# Patient Record
Sex: Male | Born: 1965
Health system: Southern US, Community
[De-identification: ages and names within clinical notes are randomized; demographics above are authoritative.]

## PROBLEM LIST (undated history)

## (undated) DIAGNOSIS — T4145XA Adverse effect of unspecified anesthetic, initial encounter: Secondary | ICD-10-CM

## (undated) DIAGNOSIS — E781 Pure hyperglyceridemia: Secondary | ICD-10-CM

## (undated) DIAGNOSIS — I499 Cardiac arrhythmia, unspecified: Secondary | ICD-10-CM

## (undated) DIAGNOSIS — R519 Headache, unspecified: Secondary | ICD-10-CM

## (undated) DIAGNOSIS — R0600 Dyspnea, unspecified: Secondary | ICD-10-CM

## (undated) DIAGNOSIS — R200 Anesthesia of skin: Secondary | ICD-10-CM

## (undated) DIAGNOSIS — G4730 Sleep apnea, unspecified: Secondary | ICD-10-CM

## (undated) DIAGNOSIS — T8859XA Other complications of anesthesia, initial encounter: Secondary | ICD-10-CM

## (undated) DIAGNOSIS — I10 Essential (primary) hypertension: Secondary | ICD-10-CM

## (undated) DIAGNOSIS — C61 Malignant neoplasm of prostate: Secondary | ICD-10-CM

## (undated) DIAGNOSIS — I4891 Unspecified atrial fibrillation: Secondary | ICD-10-CM

## (undated) DIAGNOSIS — M1990 Unspecified osteoarthritis, unspecified site: Secondary | ICD-10-CM

## (undated) HISTORY — PX: ANKLE SURGERY: SHX546

## (undated) HISTORY — DX: Malignant neoplasm of prostate: C61

## (undated) HISTORY — PX: JOINT REPLACEMENT: SHX530

## (undated) HISTORY — DX: Sleep apnea, unspecified: G47.30

---

## 1999-04-01 ENCOUNTER — Encounter: Payer: Self-pay | Admitting: Specialist

## 1999-04-01 ENCOUNTER — Ambulatory Visit (HOSPITAL_COMMUNITY): Admission: RE | Admit: 1999-04-01 | Discharge: 1999-04-01 | Payer: Self-pay | Admitting: Specialist

## 2001-02-10 HISTORY — PX: HAND SURGERY: SHX662

## 2001-05-03 ENCOUNTER — Emergency Department (HOSPITAL_COMMUNITY): Admission: EM | Admit: 2001-05-03 | Discharge: 2001-05-03 | Payer: Self-pay | Admitting: Emergency Medicine

## 2002-04-06 ENCOUNTER — Ambulatory Visit (HOSPITAL_COMMUNITY): Admission: RE | Admit: 2002-04-06 | Discharge: 2002-04-06 | Payer: Self-pay | Admitting: Specialist

## 2002-04-06 ENCOUNTER — Encounter: Payer: Self-pay | Admitting: Specialist

## 2008-02-21 ENCOUNTER — Inpatient Hospital Stay (HOSPITAL_COMMUNITY): Admission: EM | Admit: 2008-02-21 | Discharge: 2008-02-23 | Payer: Self-pay | Admitting: Emergency Medicine

## 2008-04-06 ENCOUNTER — Ambulatory Visit (HOSPITAL_COMMUNITY): Admission: RE | Admit: 2008-04-06 | Discharge: 2008-04-06 | Payer: Self-pay | Admitting: Family Medicine

## 2010-05-27 LAB — COMPREHENSIVE METABOLIC PANEL
ALT: 22 U/L (ref 0–53)
AST: 18 U/L (ref 0–37)
Albumin: 2.7 g/dL — ABNORMAL LOW (ref 3.5–5.2)
Alkaline Phosphatase: 43 U/L (ref 39–117)
BUN: 8 mg/dL (ref 6–23)
Chloride: 101 mEq/L (ref 96–112)
GFR calc Af Amer: >60 mL/min (ref >60)
Potassium: 3.5 mEq/L (ref 3.5–5.1)
Sodium: 135 mEq/L (ref 135–145)
Total Bilirubin: 0.8 mg/dL (ref 0.3–1.2)
Total Protein: 6.2 g/dL (ref 6.0–8.3)

## 2010-05-27 LAB — TROPONIN I: Troponin I: <0.01 ng/mL (ref 0.00–0.06)

## 2010-05-27 LAB — CULTURE, BLOOD (ROUTINE X 2)
Culture: NO GROWTH
Culture: NO GROWTH

## 2010-05-27 LAB — CBC
HCT: 36.8 % — ABNORMAL LOW (ref 39.0–52.0)
HCT: 36.8 % — ABNORMAL LOW (ref 39.0–52.0)
HCT: 41.2 % (ref 39.0–52.0)
Hemoglobin: 13.8 g/dL (ref 13.0–17.0)
MCHC: 33.4 g/dL (ref 30.0–36.0)
MCHC: 33.5 g/dL (ref 30.0–36.0)
MCV: 85.5 fL (ref 78.0–100.0)
MCV: 86.3 fL (ref 78.0–100.0)
Platelets: 231 10*3/uL (ref 150–400)
Platelets: 244 K/uL (ref 150–400)
Platelets: 250 10*3/uL (ref 150–400)
RBC: 4.82 MIL/uL (ref 4.22–5.81)
RDW: 12.6 % (ref 11.5–15.5)
RDW: 13 % (ref 11.5–15.5)
WBC: 4.9 10*3/uL (ref 4.0–10.5)
WBC: 7.9 10*3/uL (ref 4.0–10.5)
WBC: 9.6 K/uL (ref 4.0–10.5)

## 2010-05-27 LAB — DIFFERENTIAL
Basophils Absolute: 0 10*3/uL (ref 0.0–0.1)
Basophils Relative: 0 % (ref 0–1)
Basophils Relative: 0 % (ref 0–1)
Eosinophils Absolute: 0.1 K/uL (ref 0.0–0.7)
Eosinophils Absolute: 0.3 10*3/uL (ref 0.0–0.7)
Eosinophils Relative: 1 % (ref 0–5)
Eosinophils Relative: 7 % — ABNORMAL HIGH (ref 0–5)
Lymphocytes Relative: 11 % — ABNORMAL LOW (ref 12–46)
Lymphs Abs: 1 K/uL (ref 0.7–4.0)
Lymphs Abs: 1.1 10*3/uL (ref 0.7–4.0)
Monocytes Absolute: 0.7 10*3/uL (ref 0.1–1.0)
Monocytes Relative: 8 % (ref 3–12)
Monocytes Relative: 8 % (ref 3–12)
Neutro Abs: 7.7 K/uL (ref 1.7–7.7)
Neutrophils Relative %: 81 % — ABNORMAL HIGH (ref 43–77)

## 2010-05-27 LAB — URINALYSIS, ROUTINE W REFLEX MICROSCOPIC
Bilirubin Urine: NEGATIVE
Glucose, UA: NEGATIVE mg/dL
Hgb urine dipstick: NEGATIVE
Ketones, ur: NEGATIVE mg/dL
Nitrite: NEGATIVE
Protein, ur: NEGATIVE mg/dL
Specific Gravity, Urine: 1.02 (ref 1.005–1.030)
Urobilinogen, UA: 4 mg/dL — ABNORMAL HIGH (ref 0.0–1.0)
pH: 6 (ref 5.0–8.0)

## 2010-05-27 LAB — POCT I-STAT, CHEM 8
BUN: 9 mg/dL (ref 6–23)
Calcium, Ion: 1.15 mmol/L (ref 1.12–1.32)
Chloride: 101 meq/L (ref 96–112)
Creatinine, Ser: 1 mg/dL (ref 0.4–1.5)
Glucose, Bld: 107 mg/dL — ABNORMAL HIGH (ref 70–99)
HCT: 42 % (ref 39.0–52.0)
Hemoglobin: 14.3 g/dL (ref 13.0–17.0)
Potassium: 3.7 meq/L (ref 3.5–5.1)
Sodium: 138 meq/L (ref 135–145)
TCO2: 25 mmol/L (ref 0–100)

## 2010-05-27 LAB — CK TOTAL AND CKMB (NOT AT ARMC)
CK, MB: 0.7 ng/mL (ref 0.3–4.0)
Relative Index: INVALID (ref 0.0–2.5)
Total CK: 28 U/L (ref 7–232)

## 2010-05-27 LAB — BRAIN NATRIURETIC PEPTIDE: Pro B Natriuretic peptide (BNP): 34 pg/mL (ref 0.0–100.0)

## 2010-06-25 NOTE — H&P (Signed)
Andrew Haley, Andrew Haley                  ACCOUNT NO.:  [id number]   MEDICAL RECORD NO.:  [id number]          PATIENT TYPE:  INP   LOCATION:  1845                         FACILITY:  [facility]   PHYSICIAN:  Ladell Pier, M.D.   DATE OF BIRTH:  [birth year]   DATE OF ADMISSION:  02/21/2008  DATE OF DISCHARGE:                              HISTORY & PHYSICAL   CHIEF COMPLAINT:  Shortness of breath and right-sided chest pain with  deep breathing.   HISTORY OF PRESENT ILLNESS:  The patient is a 45 year old white male  with no significant past medical history.  The patient stated that  starting yesterday, he has been having pain in the right side of his  chest.  It is worse when he takes a deep breath, he has  to hold his  breath.  He thought it was secondary  to __________ muscles that he  pulled a muscle.  He has had no nausea, vomiting.  He had some cough but  it is dry.  He has had no fever or chills.  It just hurts on the right  side, tender to touch, and hurts when he breathe.   PAST MEDICAL HISTORY:  Significant for knee surgery.   FAMILY HISTORY:  Mother died on the operating table during the time when  she was having aortic valve replaced, she was 54 years old.  Father died  of cancer when he was 85 years old.  He had throat cancer.   SOCIAL HISTORY:  No tobacco use.  No alcohol use.  He has a significant  other.  He has no children.  He does Proofreader.   MEDICATIONS:  1. Aleve p.r.n.  2. Goody's Powder p.r.n.   ALLERGIES:  None.   REVIEW OF SYSTEMS:  Negative,  otherwise stated in the HPI.   PHYSICAL EXAMINATION:  VITAL SIGNS:  Temperature 99.7, blood pressure  135/74, pulse 103, respirations 18, pulse ox 99%.  GENERAL:  Patient is sitting up on stretcher, does not seem to be in any  acute distress.  HEENT:  Head normocephalic/atraumatic.  Pupils reactive to light.  Throat without erythema.  CARDIOVASCULAR:  He is slightly tachycardiac.  LUNGS:  He he has some rhonchi on  the right side.  ABDOMEN:  Positive bowel sounds.  EXTREMITIES:  Without edema.   LABORATORY DATA:  CT scan of the chest showed probable right lower lobe  pneumonia and trace right parapneumonic effusion.  CK, creatinine kinase  28, MB 0.7.  WBC 9.6, hemoglobin 13.8, MCV 85.5, platelets 244.  Troponin of less than 0.01.  BNP of 34.  Sodium 138, potassium 3.7,  chloride 101, glucose 107, BUN 9, creatinine 1.0.   ASSESSMENT/PLAN:  1. Pneumonia.  2. Trace parapneumonic effusion.  3. Pleuritic chest pain.   Will admit the patient to hospital.  We will start him on intravenous  Rocephin and Zithromax.  We will monitor his pneumonia closely since he  does have some trace of parapneumonic effusion.  If he does not improve  and  the parapneumonic effusion worsens, he will probably need a  __________ consult and thoracentesis.  We will do Protonix for  gastrointestinal prophylaxis, Lovenox for deep vein thrombosis  prophylaxis, and manage his pain.  We will also do nebulizer treatments  for now.      Ladell Pier, M.D.  Electronically Signed     [place]/MEDQ  D:  02/21/2008  T:  02/21/2008  Job:  [id number]

## 2010-06-25 NOTE — Discharge Summary (Signed)
Andrew Haley, Andrew Haley                  ACCOUNT NO.:  [id number]   MEDICAL RECORD NO.:  [id number]          PATIENT TYPE:  INP   LOCATION:  3018                         FACILITY:  [facility]   PHYSICIAN:  Richarda Overlie, MD       DATE OF BIRTH:  Sep 30, 1965   DATE OF ADMISSION:  02/21/2008  DATE OF DISCHARGE:  02/23/2008                               DISCHARGE SUMMARY   DISCHARGE DIAGNOSES:  1. Community-acquired pneumonia.  2. Trace parapneumonic effusion.  3. Pleuritic chest pain.   SUBJECTIVE:  This is a 45 year old male with no significant past medical  history who presented to the ER with right-sided chest wall pain, worse  when taking a deep breath.  The patient initially thought he had pulled  a muscle.  However, when he presented to the emergency room, the patient  was found to be tachycardic with a low grade fever of 99.7.  Chest x-ray  showed pneumonia with trace right parapneumonic effusion.  The patient  also had a subsequent CT angio chest to rule out PE.  It was negative  for PE with probable right lower lobe pneumonia, trace right  parapneumonic effusion.  The patient was admitted and initiated on  ceftriaxone and azithromycin.  For pain control, the patient was started  on IV Toradol, Flexeril, and Percocet.  The patient had subsequent  lateral decubitus chest x-ray that showed atelectasis of the right lung  on the right side, but no significant pleural effusion with a trace  amount of pleural fluid in the right costophrenic angle.  The patient  was initiated on incentive spirometry.  On the following day, the  patient felt a lot better and requested to go home.  He remained  afebrile during the course of his hospitalization.  The patient was  recommended to continue deep breathing, coughing, and incentive  spirometry at home.  He is being discharged with antiinflammatory  medications and antibiotics for another 4 days.  He does not have a  primary care Danasia Baker, and thus, a  list of primary care providers in the  area who are accepting new patients is being provided.  Since he has  insurance, he will not be seen at the health department.   FOLLOWUP INSTRUCTIONS:  Return to work on February 28, 2008.  Call PCP to  set up appointment in 5 to 7 days.  Deep breathing exercises and  coughing.   DISCHARGE MEDICATIONS:  1. Moxifloxacin 400 mg p.o. daily for 4 days.  2. Flexeril 10 mg p.o. q.8 hours p.r.n.  3. Toradol 10 mg p.o. q.6 hours p.r.n.  4. Ibuprofen 600 mg p.o. q.8 hours x3 days with meals and then p.r.n.  5. Call for fever greater than 101, shortness of breath, chest pain,      or dizziness.      Richarda Overlie, MD  Electronically Signed     NA/MEDQ  D:  02/23/2008  T:  02/23/2008  Job:  [id number]

## 2013-09-02 NOTE — H&P (Signed)
  NTS SOAP Note  Vital Signs:  Vitals as of: 2/80/0349: Systolic 179: Diastolic 150: Heart Rate 86: Temp 96.85F: Height 45ft 1in: Weight 322Lbs 0 Ounces: BMI 42.48  BMI : 42.48 kg/m2  Subjective: This 48 Years 0 Months old Male presents for   a skin lesion on his right inner thigh.  Gets irritated with pressure.  Growth and pain have been noted recently.  Review of Symptoms:  Constitutional:unremarkable   Head:unremarkable    Eyes:unremarkable   Nose/Mouth/Throat:unremarkable Cardiovascular:  unremarkable   Respiratory:  wheezing Gastrointestinal:  unremarkable   Genitourinary:unremarkable       joint pain Hematolgic/Lymphatic:unremarkable     Allergic/Immunologic:unremarkable     Past Medical History:    Reviewed  Past Medical History  Surgical History: right knee surgery Medical Problems: HTN, skeletal pain Allergies: nkda Medications: lisinopril, diclofenac, hydrocodone   Social History:Reviewed  Social History  Preferred Language: English Race:  White Ethnicity: Not Hispanic / Latino Age: 48 Years 0 Months Marital Status:  S Alcohol: 3 a week   Smoking Status: Never smoker reviewed on 09/01/2013 Functional Status reviewed on 09/01/2013 ------------------------------------------------ Bathing: Normal Cooking: Normal Dressing: Normal Driving: Normal Eating: Normal Managing Meds: Normal Oral Care: Normal Shopping: Normal Toileting: Normal Transferring: Normal Walking: Normal Cognitive Status reviewed on 09/01/2013 ------------------------------------------------ Attention: Normal Decision Making: Normal Language: Normal Memory: Normal Motor: Normal Perception: Normal Problem Solving: Normal Visual and Spatial: Normal   Family History:  Reviewed  Family Health History Mother, Living; Heart disease;  Father, Living; Healthy; throat cancer    Objective Information: General:  Well appearing, well  nourished in no distress.      Large 3cm penduculated skin lesion in right inner thigh Heart:  RRR, no murmur Lungs:    CTA bilaterally, no wheezes, rhonchi, rales.  Breathing unlabored.  Assessment:Skin lesion, right thigh  Diagnoses: 239.2 Neoplasm of skin (Neoplasm of unspecified behavior of bone, soft tissue, and skin)  Procedures: 56979 - OFFICE OUTPATIENT NEW 20 MINUTES    Plan:  Scheduled for excision of skin lesion, right thigh on 09/23/13.   Patient Education:Alternative treatments to surgery were discussed with patient (and family).  Risks and benefits  of procedure were fully explained to the patient (and family) who gave informed consent. Patient/family questions were addressed.  Follow-up:Pending Surgery

## 2013-09-09 ENCOUNTER — Encounter (HOSPITAL_COMMUNITY): Payer: Self-pay | Admitting: Pharmacy Technician

## 2013-09-23 ENCOUNTER — Encounter (HOSPITAL_COMMUNITY)
Admission: RE | Disposition: A | Discharge status: Home / Self Care | Payer: Self-pay | Source: Ambulatory Visit | Attending: General Surgery

## 2013-09-23 ENCOUNTER — Ambulatory Visit (HOSPITAL_COMMUNITY)
Admission: RE | Admit: 2013-09-23 | Discharge: 2013-09-23 | Disposition: A | Discharge status: Home / Self Care | Payer: Medicare HMO | Source: Ambulatory Visit | Attending: General Surgery | Admitting: General Surgery

## 2013-09-23 ENCOUNTER — Encounter (HOSPITAL_COMMUNITY): Payer: Self-pay | Admitting: *Deleted

## 2013-09-23 DIAGNOSIS — L909 Atrophic disorder of skin, unspecified: Secondary | ICD-10-CM | POA: Diagnosis not present

## 2013-09-23 DIAGNOSIS — L919 Hypertrophic disorder of the skin, unspecified: Principal | ICD-10-CM

## 2013-09-23 HISTORY — PX: LESION REMOVAL: SHX5196

## 2013-09-23 SURGERY — MINOR EXCISION OF LESION
Anesthesia: LOCAL | Laterality: Left

## 2013-09-23 MED ORDER — BACITRACIN-NEOMYCIN-POLYMYXIN 400-5-5000 EX OINT
TOPICAL_OINTMENT | CUTANEOUS | Status: AC
  Filled 2013-09-23: qty 1

## 2013-09-23 MED ORDER — LIDOCAINE-EPINEPHRINE (PF) 1 %-1:200000 IJ SOLN
INTRAMUSCULAR | Status: AC
  Filled 2013-09-23: qty 10

## 2013-09-23 NOTE — Op Note (Signed)
Patient:  Andrew Haley  DOB:  12-10-1965  MRN:  [id number]   Preop Diagnosis:  Skin tag, left thigh  Postop Diagnosis:  Same  Procedure:  Excision of skin tag, left thigh  Surgeon:  Aviva Signs, M.D.  Anes:  Local  Indications:  Patient is a 48 year old white male who presents with an enlarging skin tag on the left inner thigh. The risks and benefits of the procedure were fully explained to the patient, who gave informed consent.  Procedure note:  The patient was placed in the prone position. The left upper inner thigh was prepped and draped using usual sterile technique with Betadine. Surgical site confirmation was performed.  1% Xylocaine with epi was used for local anesthesia. 7 cc was used in total. An elliptical incision was made around the base of the skin tag using Bovie electrocautery. The base was 1.5 cm in its greatest diameter. The specimen was disposed of. Minimal subcutaneous tissue was noted. The incision was closed using 4-0 Vicryl subcuticular suture. Dermabond was then applied.  All tape and needle counts were correct the end of the procedure. Patient was discharged from the minor procedure room in good and stable condition.    Complications:  None  EBL:  Minimal  Specimen:  Skin tag, disposed of

## 2013-09-23 NOTE — Interval H&P Note (Signed)
History and Physical Interval Note:  09/23/2013 8:43 AM  Andrew Haley  has presented today for surgery, with the diagnosis of neoplasm of skin left thigh 3 cm  The various methods of treatment have been discussed with the patient and family. After consideration of risks, benefits and other options for treatment, the patient has consented to  Procedure(s): MINOR EXCISION 3 CM SKIN LESION OF LEFT THIGH  (Left) as a surgical intervention .  The patient's history has been reviewed, patient examined, no change in status, stable for surgery.  I have reviewed the patient's chart and labs.  Questions were answered to the patient's satisfaction.     Aviva Signs A

## 2013-09-23 NOTE — Interval H&P Note (Signed)
History and Physical Interval Note:  09/23/2013 8:39 AM  Andrew Haley  has presented today for surgery, with the diagnosis of neoplasm of skin left thigh 3 cm  The various methods of treatment have been discussed with the patient and family. After consideration of risks, benefits and other options for treatment, the patient has consented to  Procedure(s): MINOR EXCISION 3 CM SKIN LESION OF LEFT THIGH  (Left) as a surgical intervention .  The patient's history has been reviewed, patient examined, no change in status, stable for surgery.  I have reviewed the patient's chart and labs.  Questions were answered to the patient's satisfaction.     Aviva Signs A

## 2013-09-23 NOTE — Discharge Instructions (Signed)
Keep wound clean and dry.

## 2013-09-23 NOTE — Progress Notes (Signed)
Patient tolerated well sent home with discharge instructions. Follow up with dr Arnoldo Morale as needed.

## 2013-09-27 ENCOUNTER — Encounter (HOSPITAL_COMMUNITY): Payer: Self-pay | Admitting: General Surgery

## 2013-12-14 ENCOUNTER — Other Ambulatory Visit: Payer: Self-pay | Admitting: Surgical

## 2014-02-15 ENCOUNTER — Other Ambulatory Visit (HOSPITAL_COMMUNITY): Payer: Self-pay | Admitting: *Deleted

## 2014-02-15 NOTE — Patient Instructions (Addendum)
Andrew Haley  02/15/2014   Your procedure is scheduled on: 02/22/14   Report to [facility]  Entrance and follow signs to               [address] at 9:30  AM   Call this number if you have problems the morning of surgery [phone]   Remember:  Do not eat food or drink liquids :After Midnight.     Take these medicines the morning of surgery with A SIP OF WATER: MAY TAKE HYDROCODONE IF NEEDED                               You may not have any metal on your body including hair pins and              piercings  Do not wear jewelry, make-up, lotions, powders or perfumes.             Do not wear nail polish.  Do not shave  48 hours prior to surgery.              Men may shave face and neck.   Do not bring valuables to the hospital. Andrew Haley.  Contacts, dentures or bridgework may not be worn into surgery.  Leave suitcase in the car. After surgery it may be brought to your room.     Patients discharged the day of surgery will not be allowed to drive home.  Name and phone number of your driver:  Special Instructions: N/A              Please read over the following fact sheets you were given: _____________________________________________________________________                                                     Andrew Haley  Before surgery, you can play an important role.  Because skin is not sterile, your skin needs to be as free of germs as possible.  You can reduce the number of germs on your skin by washing with CHG (chlorahexidine gluconate) soap before surgery.  CHG is an antiseptic cleaner which kills germs and bonds with the skin to continue killing germs even after washing. Please DO NOT use if you have an allergy to CHG or antibacterial soaps.  If your skin becomes reddened/irritated stop using the CHG and inform your nurse when you arrive at Short Stay. Do not shave  (including legs and underarms) for at least 48 hours prior to the first CHG shower.  You may shave your face. Please follow these instructions carefully:   1.  Shower with CHG Soap the night before surgery and the  morning of Surgery.   2.  If you choose to wash your hair, wash your hair first as usual with your  normal  Shampoo.   3.  After you shampoo, rinse your hair and body thoroughly to remove the  shampoo.  4.  Use CHG as you would any other liquid soap.  You can apply chg directly  to the skin and wash . Gently wash with scrungie or clean wascloth    5.  Apply the CHG Soap to your body ONLY FROM THE NECK DOWN.   Do not use on open                           Wound or open sores. Avoid contact with eyes, ears mouth and genitals (private parts).                        Genitals (private parts) with your normal soap.              6.  Wash thoroughly, paying special attention to the area where your surgery  will be performed.   7.  Thoroughly rinse your body with warm water from the neck down.   8.  DO NOT shower/wash with your normal soap after using and rinsing off  the CHG Soap .                9.  Pat yourself dry with a clean towel.             10.  Wear clean pajamas.             11.  Place clean sheets on your bed the night of your first shower and do not  sleep with pets.  Day of Surgery : Do not apply any lotions/deodorants the morning of surgery.  Please wear clean clothes to the hospital/surgery center.  FAILURE TO FOLLOW THESE INSTRUCTIONS MAY RESULT IN THE CANCELLATION OF YOUR SURGERY    PATIENT SIGNATURE_________________________________  ______________________________________________________________________     Andrew Haley  An incentive spirometer is a tool that can help keep your lungs clear and active. This tool measures how well you are filling your lungs with each breath. Taking long deep breaths may help  reverse or decrease the chance of developing breathing (pulmonary) problems (especially infection) following:  A long period of time when you are unable to move or be active. BEFORE THE PROCEDURE   If the spirometer includes an indicator to show your best effort, your nurse or respiratory therapist will set it to a desired goal.  If possible, sit up straight or lean slightly forward. Try not to slouch.  Hold the incentive spirometer in an upright position. INSTRUCTIONS FOR USE   Sit on the edge of your bed if possible, or sit up as far as you can in bed or on a chair.  Hold the incentive spirometer in an upright position.  Breathe out normally.  Place the mouthpiece in your mouth and seal your lips tightly around it.  Breathe in slowly and as deeply as possible, raising the piston or the ball toward the top of the column.  Hold your breath for 3-5 seconds or for as long as possible. Allow the piston or ball to fall to the bottom of the column.  Remove the mouthpiece from your mouth and breathe out normally.  Rest for a few seconds and repeat Steps 1 through 7 at least 10 times every 1-2 hours when you are awake. Take your time and take a few normal breaths between deep breaths.  The spirometer may include an indicator to show your best effort. Use the indicator as a goal to work toward during  each repetition.  After each set of 10 deep breaths, practice coughing to be sure your lungs are clear. If you have an incision (the cut made at the time of surgery), support your incision when coughing by placing a pillow or rolled up towels firmly against it. Once you are able to get out of bed, walk around indoors and cough well. You may stop using the incentive spirometer when instructed by your caregiver.  RISKS AND COMPLICATIONS  Take your time so you do not get dizzy or light-headed.  If you are in pain, you may need to take or ask for pain medication before doing incentive spirometry.  It is harder to take a deep breath if you are having pain. AFTER USE  Rest and breathe slowly and easily.  It can be helpful to keep track of a log of your progress. Your caregiver can provide you with a simple table to help with this. If you are using the spirometer at home, follow these instructions: Andrew Haley IF:   You are having difficultly using the spirometer.  You have trouble using the spirometer as often as instructed.  Your pain medication is not giving enough relief while using the spirometer.  You develop fever of 100.5 F (38.1 C) or higher. SEEK IMMEDIATE MEDICAL CARE IF:   You cough up bloody sputum that had not been present before.  You develop fever of 102 F (38.9 C) or greater.  You develop worsening pain at or near the incision site. MAKE SURE YOU:   Understand these instructions.  Will watch your condition.  Will get help right away if you are not doing well or get worse. Document Released: 06/09/2006 Document Revised: 04/21/2011 Document Reviewed: 08/10/2006 Exit[employer] Patient Information 2014 Exit[employer], [place].   ________________________________________________________________________  WHAT IS A BLOOD TRANSFUSION? Blood Transfusion Information  A transfusion is the replacement of blood or some of its parts. Blood is made up of multiple cells which provide different functions.  Red blood cells carry oxygen and are used for blood loss replacement.  White blood cells fight against infection.  Platelets control bleeding.  Plasma helps clot blood.  Other blood products are available for specialized needs, such as hemophilia or other clotting disorders. BEFORE THE TRANSFUSION  Who gives blood for transfusions?   Healthy volunteers who are fully evaluated to make sure their blood is safe. This is blood bank blood. Transfusion therapy is the safest it has ever been in the practice of medicine. Before blood is taken from a donor, a complete  history is taken to make sure that person has no history of diseases nor engages in risky social behavior (examples are intravenous drug use or sexual activity with multiple partners). The donor's travel history is screened to minimize risk of transmitting infections, such as malaria. The donated blood is tested for signs of infectious diseases, such as HIV and hepatitis. The blood is then tested to be sure it is compatible with you in order to minimize the chance of a transfusion reaction. If you or a relative donates blood, this is often done in anticipation of surgery and is not appropriate for emergency situations. It takes many days to process the donated blood. RISKS AND COMPLICATIONS Although transfusion therapy is very safe and saves many lives, the main dangers of transfusion include:   Getting an infectious disease.  Developing a transfusion reaction. This is an allergic reaction to something in the blood you were given. Every precaution is taken to prevent  this. The decision to have a blood transfusion has been considered carefully by your caregiver before blood is given. Blood is not given unless the benefits outweigh the risks. AFTER THE TRANSFUSION  Right after receiving a blood transfusion, you will usually feel much better and more energetic. This is especially true if your red blood cells have gotten low (anemic). The transfusion raises the level of the red blood cells which carry oxygen, and this usually causes an energy increase.  The nurse administering the transfusion will monitor you carefully for complications. HOME CARE INSTRUCTIONS  No special instructions are needed after a transfusion. You may find your energy is better. Speak with your caregiver about any limitations on activity for underlying diseases you may have. SEEK MEDICAL CARE IF:   Your condition is not improving after your transfusion.  You develop redness or irritation at the intravenous (IV) site. SEEK  IMMEDIATE MEDICAL CARE IF:  Any of the following symptoms occur over the next 12 hours:  Shaking chills.  You have a temperature by mouth above 102 F (38.9 C), not controlled by medicine.  Chest, back, or muscle pain.  People around you feel you are not acting correctly or are confused.  Shortness of breath or difficulty breathing.  Dizziness and fainting.  You get a rash or develop hives.  You have a decrease in urine output.  Your urine turns a dark color or changes to pink, red, or brown. Any of the following symptoms occur over the next 10 days:  You have a temperature by mouth above 102 F (38.9 C), not controlled by medicine.  Shortness of breath.  Weakness after normal activity.  The white part of the eye turns yellow (jaundice).  You have a decrease in the amount of urine or are urinating less often.  Your urine turns a dark color or changes to pink, red, or brown. Document Released: 01/25/2000 Document Revised: 04/21/2011 Document Reviewed: 09/13/2007 [facility] Patient Information 2014 [place], [place].  _______________________________________________________________________

## 2014-02-16 ENCOUNTER — Encounter (HOSPITAL_COMMUNITY): Payer: Self-pay

## 2014-02-16 ENCOUNTER — Ambulatory Visit (HOSPITAL_COMMUNITY)
Admission: RE | Admit: 2014-02-16 | Discharge: 2014-02-16 | Disposition: A | Discharge status: Home / Self Care | Payer: Medicare HMO | Source: Ambulatory Visit | Attending: Surgical | Admitting: Surgical

## 2014-02-16 ENCOUNTER — Encounter (HOSPITAL_COMMUNITY)
Admission: RE | Admit: 2014-02-16 | Discharge: 2014-02-16 | Disposition: A | Discharge status: Home / Self Care | Payer: Medicare HMO | Source: Ambulatory Visit | Attending: Orthopedic Surgery | Admitting: Orthopedic Surgery

## 2014-02-16 DIAGNOSIS — I10 Essential (primary) hypertension: Secondary | ICD-10-CM | POA: Insufficient documentation

## 2014-02-16 DIAGNOSIS — L089 Local infection of the skin and subcutaneous tissue, unspecified: Secondary | ICD-10-CM | POA: Insufficient documentation

## 2014-02-16 DIAGNOSIS — Z01818 Encounter for other preprocedural examination: Secondary | ICD-10-CM

## 2014-02-16 HISTORY — DX: Anesthesia of skin: R20.0

## 2014-02-16 HISTORY — DX: Pure hyperglyceridemia: E78.1

## 2014-02-16 HISTORY — DX: Essential (primary) hypertension: I10

## 2014-02-16 HISTORY — DX: Unspecified osteoarthritis, unspecified site: M19.90

## 2014-02-16 LAB — URINALYSIS, ROUTINE W REFLEX MICROSCOPIC
Bilirubin Urine: NEGATIVE
Glucose, UA: NEGATIVE mg/dL
Hgb urine dipstick: NEGATIVE
Ketones, ur: NEGATIVE mg/dL
Leukocytes, UA: NEGATIVE
Nitrite: NEGATIVE
Protein, ur: NEGATIVE mg/dL
Specific Gravity, Urine: 1.008 (ref 1.005–1.030)
Urobilinogen, UA: 0.2 mg/dL (ref 0.0–1.0)
pH: 5.5 (ref 5.0–8.0)

## 2014-02-16 LAB — SURGICAL PCR SCREEN
MRSA, PCR: NEGATIVE
Staphylococcus aureus: POSITIVE — AB

## 2014-02-16 LAB — APTT: aPTT: 27 seconds (ref 24–37)

## 2014-02-16 LAB — ABO/RH: ABO/RH(D): A POS

## 2014-02-16 LAB — PROTIME-INR
INR: 0.9 (ref 0.00–1.49)
PROTHROMBIN TIME: 12.3 s (ref 11.6–15.2)

## 2014-02-16 NOTE — Progress Notes (Signed)
Small pustule noted on left 5th finger. Advised pt to see PCP for check before surgery. 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

## 2014-02-16 NOTE — Progress Notes (Signed)
   02/16/14 1150  OBSTRUCTIVE SLEEP APNEA  Have you ever been diagnosed with sleep apnea through a sleep study? No  Do you snore loudly (loud enough to be heard through closed doors)?  0  Do you often feel tired, fatigued, or sleepy during the daytime? 0  Has anyone observed you stop breathing during your sleep? 0  Do you have, or are you being treated for high blood pressure? 1  BMI more than 35 kg/m2? 1  Age over 51 years old? 1  Neck circumference greater than 40 cm/16 inches? 1  Gender: 1  Obstructive Sleep Apnea Score 5  Score 4 or greater  Results sent to PCP

## 2014-02-16 NOTE — Progress Notes (Signed)
CBC / CMET UA done 01/24/14 on chart

## 2014-02-21 MED ORDER — DEXTROSE 5 % IV SOLN
3.0000 g | INTRAVENOUS | Status: AC
  Administered 2014-02-22: 3 g via INTRAVENOUS
  Filled 2014-02-21 (×2): qty 3000

## 2014-02-21 NOTE — H&P (Signed)
TOTAL KNEE ADMISSION H&P  Patient is being admitted for left total knee arthroplasty.  Subjective:  Chief Complaint:left knee pain.  HPI: Andrew Haley, 49 y.o. male, has a history of pain and functional disability in the left knee due to arthritis and has failed non-surgical conservative treatments for greater than 12 weeks to includeNSAID's and/or analgesics, corticosteriod injections and activity modification.  Onset of symptoms was gradual, starting >10 years ago with gradually worsening course since that time. The patient noted prior procedures on the knee to include  arthroscopy and menisectomy on the left knee(s).  Patient currently rates pain in the left knee(s) at 8 out of 10 with activity. Patient has night pain, worsening of pain with activity and weight bearing, pain that interferes with activities of daily living, pain with passive range of motion, crepitus and joint swelling.  Patient has evidence of periarticular osteophytes and joint space narrowing by imaging studies.  There is no active infection.   Past Medical History  Diagnosis Date  . Hypertension   . Hypertriglyceridemia   . Numbness of fingers of both hands   . Arthritis   . History of gout     Past Surgical History  Procedure Laterality Date  . Lesion removal Left 09/23/2013    Procedure: MINOR EXCISION 3 CM SKIN LESION OF LEFT THIGH ;  Surgeon: Jamesetta So, MD;  Location: [facility];  Service: General;  Laterality: Left;  . Hand surgery  2003     Current outpatient prescriptions:  diclofenac (VOLTAREN) 75 MG EC tablet, Take 75 mg by mouth 2 (two) times daily. , Disp: , Rfl: ;   HYDROcodone-acetaminophen (NORCO) 10-325 MG per tablet, Take 1 tablet by mouth every 4 (four) hours as needed for moderate pain., Disp: , Rfl: ;   lisinopril-hydrochlorothiazide (PRINZIDE,ZESTORETIC) 20-12.5 MG per tablet, Take 1 tablet by mouth every morning., Disp: , Rfl:    No Known Allergies  History  Substance Use Topics  .  Smoking status: Never Smoker   . Smokeless tobacco: Not on file  . Alcohol Use: Yes     Comment: OCCASIONAL      Review of Systems  Constitutional: Negative for fever, chills, weight loss, malaise/fatigue and diaphoresis.  HENT: Positive for tinnitus. Negative for congestion, ear discharge, ear pain, hearing loss, nosebleeds and sore throat.   Eyes: Negative.   Respiratory: Positive for shortness of breath. Negative for cough, hemoptysis, sputum production, wheezing and stridor.   Cardiovascular: Negative.   Gastrointestinal: Negative.   Genitourinary: Negative.   Musculoskeletal: Positive for back pain and joint pain. Negative for myalgias, falls and neck pain.       Left knee pain  Skin: Negative.   Neurological: Positive for weakness. Negative for dizziness, tingling, tremors, sensory change, speech change, focal weakness, seizures, loss of consciousness and headaches.  Endo/Heme/Allergies: Negative.   Psychiatric/Behavioral: Negative.     Objective:  Physical Exam  Constitutional: He is oriented to person, place, and time. He appears well-developed. No distress.  Obese  HENT:  Head: Normocephalic and atraumatic.  Right Ear: External ear normal.  Left Ear: External ear normal.  Nose: Nose normal.  Mouth/Throat: Oropharynx is clear and moist.  Eyes: Conjunctivae and EOM are normal.  Neck: Normal range of motion. Neck supple.  Cardiovascular: Normal rate, regular rhythm, normal heart sounds and intact distal pulses.   No murmur heard. Respiratory: Effort normal and breath sounds normal. No respiratory distress. He has no wheezes.  GI: Soft. Bowel sounds are normal.  He exhibits no distension. There is no tenderness.  Musculoskeletal:       Right hip: Normal.       Left hip: Normal.       Right knee: He exhibits decreased range of motion and swelling. He exhibits no effusion and no erythema. Tenderness found. Medial joint line and lateral joint line tenderness noted.        Left knee: He exhibits decreased range of motion and swelling. He exhibits no effusion and no erythema. Tenderness found. Medial joint line and lateral joint line tenderness noted.       Right lower leg: He exhibits no tenderness and no swelling.       Left lower leg: He exhibits no tenderness and no swelling.  Both knees 5-95 degrees  Neurological: He is alert and oriented to person, place, and time. He has normal strength and normal reflexes. No sensory deficit.  Skin: No rash noted. He is not diaphoretic. No erythema.  Psychiatric: He has a normal mood and affect. His behavior is normal.   Vitals  Weight: 319 lb Height: 75in Body Surface Area: 2.68 m Body Mass Index: 39.87 kg/m  Pulse: 84 (Regular)  BP: 154/92 (Sitting, Left Arm, Standard)  Imaging Review Plain radiographs demonstrate severe degenerative joint disease of the left knee(s). The overall alignment issignificant varus. The bone quality appears to be good for age and reported activity level.  Assessment/Plan:  End stage arthritis, left knee   The patient history, physical examination, clinical judgment of the provider and imaging studies are consistent with end stage degenerative joint disease of the left knee(s) and total knee arthroplasty is deemed medically necessary. The treatment options including medical management, injection therapy arthroscopy and arthroplasty were discussed at length. The risks and benefits of total knee arthroplasty were presented and reviewed. The risks due to aseptic loosening, infection, stiffness, patella tracking problems, thromboembolic complications and other imponderables were discussed. The patient acknowledged the explanation, agreed to proceed with the plan and consent was signed. Patient is being admitted for inpatient treatment for surgery, pain control, PT, OT, prophylactic antibiotics, VTE prophylaxis, progressive ambulation and ADL's and discharge planning. The patient is  planning to be discharged home with home health services    PCP: Ranell Patrick [place], PA-C

## 2014-02-22 ENCOUNTER — Encounter (HOSPITAL_COMMUNITY): Payer: Self-pay | Admitting: *Deleted

## 2014-02-22 ENCOUNTER — Inpatient Hospital Stay (HOSPITAL_COMMUNITY): Payer: Commercial Managed Care - HMO | Admitting: Anesthesiology

## 2014-02-22 ENCOUNTER — Inpatient Hospital Stay (HOSPITAL_COMMUNITY)
Admission: RE | Admit: 2014-02-22 | Discharge: 2014-02-27 | DRG: 470 | Disposition: A | Discharge status: Home Health | Payer: Commercial Managed Care - HMO | Source: Ambulatory Visit | Attending: Orthopedic Surgery | Admitting: Orthopedic Surgery

## 2014-02-22 ENCOUNTER — Encounter (HOSPITAL_COMMUNITY)
Admission: RE | Disposition: A | Discharge status: Home Health | Payer: Self-pay | Source: Ambulatory Visit | Attending: Orthopedic Surgery

## 2014-02-22 DIAGNOSIS — M179 Osteoarthritis of knee, unspecified: Secondary | ICD-10-CM | POA: Diagnosis not present

## 2014-02-22 DIAGNOSIS — I10 Essential (primary) hypertension: Secondary | ICD-10-CM | POA: Diagnosis present

## 2014-02-22 DIAGNOSIS — M24562 Contracture, left knee: Secondary | ICD-10-CM | POA: Diagnosis present

## 2014-02-22 DIAGNOSIS — M21162 Varus deformity, not elsewhere classified, left knee: Secondary | ICD-10-CM | POA: Diagnosis present

## 2014-02-22 DIAGNOSIS — M25062 Hemarthrosis, left knee: Secondary | ICD-10-CM | POA: Diagnosis present

## 2014-02-22 DIAGNOSIS — M21262 Flexion deformity, left knee: Secondary | ICD-10-CM | POA: Diagnosis present

## 2014-02-22 DIAGNOSIS — Z96659 Presence of unspecified artificial knee joint: Secondary | ICD-10-CM

## 2014-02-22 DIAGNOSIS — M1712 Unilateral primary osteoarthritis, left knee: Secondary | ICD-10-CM | POA: Diagnosis not present

## 2014-02-22 DIAGNOSIS — Z6841 Body Mass Index (BMI) 40.0 and over, adult: Secondary | ICD-10-CM | POA: Diagnosis not present

## 2014-02-22 DIAGNOSIS — M25562 Pain in left knee: Secondary | ICD-10-CM | POA: Diagnosis present

## 2014-02-22 HISTORY — PX: TOTAL KNEE ARTHROPLASTY: SHX125

## 2014-02-22 LAB — TYPE AND SCREEN
ABO/RH(D): A POS
Antibody Screen: NEGATIVE

## 2014-02-22 SURGERY — ARTHROPLASTY, KNEE, TOTAL
Anesthesia: General | Laterality: Left

## 2014-02-22 MED ORDER — LABETALOL HCL 5 MG/ML IV SOLN
INTRAVENOUS | Status: DC | PRN
  Administered 2014-02-22 (×2): 5 mg via INTRAVENOUS

## 2014-02-22 MED ORDER — MIDAZOLAM HCL 2 MG/2ML IJ SOLN
INTRAMUSCULAR | Status: AC
  Filled 2014-02-22: qty 2

## 2014-02-22 MED ORDER — HYDROMORPHONE HCL 1 MG/ML IJ SOLN
INTRAMUSCULAR | Status: DC | PRN
  Administered 2014-02-22: 0.5 mg via INTRAVENOUS
  Administered 2014-02-22: 1 mg via INTRAVENOUS
  Administered 2014-02-22: .5 mg via INTRAVENOUS

## 2014-02-22 MED ORDER — HYDROMORPHONE HCL 1 MG/ML IJ SOLN
0.2500 mg | INTRAMUSCULAR | Status: DC | PRN
  Administered 2014-02-22 (×4): 0.5 mg via INTRAVENOUS

## 2014-02-22 MED ORDER — PROMETHAZINE HCL 25 MG/ML IJ SOLN: 6.2500 mg | INTRAMUSCULAR | Status: DC | PRN

## 2014-02-22 MED ORDER — PROPOFOL 10 MG/ML IV BOLUS
INTRAVENOUS | Status: AC
  Filled 2014-02-22: qty 20

## 2014-02-22 MED ORDER — ACETAMINOPHEN 325 MG PO TABS
650.0000 mg | ORAL_TABLET | Freq: Four times a day (QID) | ORAL | Status: DC | PRN
Start: 2014-02-22 — End: 2014-02-27
  Administered 2014-02-23 – 2014-02-26 (×2): 650 mg via ORAL
  Filled 2014-02-22 (×2): qty 2

## 2014-02-22 MED ORDER — LACTATED RINGERS IV SOLN
INTRAVENOUS | Status: DC
  Administered 2014-02-22: 1000 mL via INTRAVENOUS
  Administered 2014-02-22: 16:00:00 via INTRAVENOUS

## 2014-02-22 MED ORDER — NEOSTIGMINE METHYLSULFATE 10 MG/10ML IV SOLN
INTRAVENOUS | Status: DC | PRN
  Administered 2014-02-22: 4 mg via INTRAVENOUS

## 2014-02-22 MED ORDER — HYDROCODONE-ACETAMINOPHEN 5-325 MG PO TABS
1.0000 | ORAL_TABLET | ORAL | Status: DC | PRN
  Administered 2014-02-25 – 2014-02-27 (×12): 2 via ORAL
  Filled 2014-02-22 (×12): qty 2

## 2014-02-22 MED ORDER — HYDROCHLOROTHIAZIDE 12.5 MG PO CAPS
12.5000 mg | ORAL_CAPSULE | Freq: Every day | ORAL | Status: DC
  Administered 2014-02-22 – 2014-02-27 (×6): 12.5 mg via ORAL
  Filled 2014-02-22 (×6): qty 1

## 2014-02-22 MED ORDER — SUFENTANIL CITRATE 50 MCG/ML IV SOLN
INTRAVENOUS | Status: DC | PRN
  Administered 2014-02-22: 10 ug via INTRAVENOUS
  Administered 2014-02-22 (×3): 5 ug via INTRAVENOUS
  Administered 2014-02-22: 10 ug via INTRAVENOUS
  Administered 2014-02-22: 15 ug via INTRAVENOUS

## 2014-02-22 MED ORDER — BUPIVACAINE HCL (PF) 0.25 % IJ SOLN
INTRAMUSCULAR | Status: AC
  Filled 2014-02-22: qty 30

## 2014-02-22 MED ORDER — RIVAROXABAN 10 MG PO TABS
10.0000 mg | ORAL_TABLET | Freq: Every day | ORAL | Status: DC
  Administered 2014-02-23 – 2014-02-27 (×5): 10 mg via ORAL
  Filled 2014-02-22 (×8): qty 1

## 2014-02-22 MED ORDER — POLYETHYLENE GLYCOL 3350 17 G PO PACK
17.0000 g | PACK | Freq: Every day | ORAL | Status: DC | PRN
Start: 2014-02-22 — End: 2014-02-27
  Administered 2014-02-25: 17 g via ORAL
  Filled 2014-02-22: qty 1

## 2014-02-22 MED ORDER — LISINOPRIL 20 MG PO TABS: 20.0000 mg | ORAL_TABLET | Freq: Every day | ORAL | Status: DC

## 2014-02-22 MED ORDER — HYDROMORPHONE HCL 2 MG/ML IJ SOLN
INTRAMUSCULAR | Status: AC
  Filled 2014-02-22: qty 1

## 2014-02-22 MED ORDER — FLEET ENEMA 7-19 GM/118ML RE ENEM: 1.0000 | ENEMA | Freq: Once | RECTAL | Status: AC | PRN

## 2014-02-22 MED ORDER — NEOSTIGMINE METHYLSULFATE 10 MG/10ML IV SOLN
INTRAVENOUS | Status: AC
  Filled 2014-02-22: qty 1

## 2014-02-22 MED ORDER — LIDOCAINE HCL (CARDIAC) 20 MG/ML IV SOLN
INTRAVENOUS | Status: DC | PRN
  Administered 2014-02-22: 100 mg via INTRAVENOUS

## 2014-02-22 MED ORDER — SODIUM CHLORIDE 0.9 % IR SOLN
Status: DC | PRN
  Administered 2014-02-22: 500 mL

## 2014-02-22 MED ORDER — LISINOPRIL 20 MG PO TABS
20.0000 mg | ORAL_TABLET | Freq: Every day | ORAL | Status: DC
  Administered 2014-02-22 – 2014-02-27 (×6): 20 mg via ORAL
  Filled 2014-02-22 (×6): qty 1

## 2014-02-22 MED ORDER — THROMBIN 5000 UNITS EX SOLR
CUTANEOUS | Status: AC
  Filled 2014-02-22: qty 5000

## 2014-02-22 MED ORDER — HYDROCHLOROTHIAZIDE 12.5 MG PO CAPS: 12.5000 mg | ORAL_CAPSULE | Freq: Every day | ORAL | Status: DC

## 2014-02-22 MED ORDER — MIDAZOLAM HCL 5 MG/5ML IJ SOLN
INTRAMUSCULAR | Status: DC | PRN
  Administered 2014-02-22 (×2): 2 mg via INTRAVENOUS

## 2014-02-22 MED ORDER — ALUM & MAG HYDROXIDE-SIMETH 200-200-20 MG/5ML PO SUSP: 30.0000 mL | ORAL | Status: DC | PRN

## 2014-02-22 MED ORDER — LACTATED RINGERS IV SOLN: INTRAVENOUS | Status: DC

## 2014-02-22 MED ORDER — SODIUM CHLORIDE 0.9 % IV SOLN
1000.0000 mg | INTRAVENOUS | Status: AC
  Administered 2014-02-22: 1000 mg via INTRAVENOUS
  Filled 2014-02-22: qty 10

## 2014-02-22 MED ORDER — ACETAMINOPHEN 10 MG/ML IV SOLN
1000.0000 mg | Freq: Once | INTRAVENOUS | Status: AC
  Administered 2014-02-22: 1000 mg via INTRAVENOUS
  Filled 2014-02-22: qty 100

## 2014-02-22 MED ORDER — GLYCOPYRROLATE 0.2 MG/ML IJ SOLN
INTRAMUSCULAR | Status: AC
  Filled 2014-02-22: qty 1

## 2014-02-22 MED ORDER — SUFENTANIL CITRATE 50 MCG/ML IV SOLN
INTRAVENOUS | Status: AC
  Filled 2014-02-22: qty 1

## 2014-02-22 MED ORDER — BISACODYL 5 MG PO TBEC: 5.0000 mg | DELAYED_RELEASE_TABLET | Freq: Every day | ORAL | Status: DC | PRN

## 2014-02-22 MED ORDER — METHOCARBAMOL 1000 MG/10ML IJ SOLN
500.0000 mg | Freq: Four times a day (QID) | INTRAVENOUS | Status: DC | PRN
  Administered 2014-02-22 – 2014-02-24 (×3): 500 mg via INTRAVENOUS
  Filled 2014-02-22 (×6): qty 5

## 2014-02-22 MED ORDER — SODIUM CHLORIDE 0.9 % IJ SOLN
INTRAMUSCULAR | Status: AC
  Filled 2014-02-22: qty 10

## 2014-02-22 MED ORDER — HYDROMORPHONE HCL 1 MG/ML IJ SOLN
INTRAMUSCULAR | Status: AC
  Filled 2014-02-22: qty 1

## 2014-02-22 MED ORDER — LISINOPRIL-HYDROCHLOROTHIAZIDE 20-12.5 MG PO TABS: 1.0000 | ORAL_TABLET | Freq: Every morning | ORAL | Status: DC

## 2014-02-22 MED ORDER — FENTANYL CITRATE 0.05 MG/ML IJ SOLN
INTRAMUSCULAR | Status: AC
  Filled 2014-02-22: qty 5

## 2014-02-22 MED ORDER — THROMBIN 5000 UNITS EX SOLR
OROMUCOSAL | Status: DC | PRN
  Administered 2014-02-22: 14:00:00 via TOPICAL

## 2014-02-22 MED ORDER — SODIUM CHLORIDE 0.9 % IJ SOLN
INTRAMUSCULAR | Status: AC
  Filled 2014-02-22: qty 50

## 2014-02-22 MED ORDER — FENTANYL CITRATE 0.05 MG/ML IJ SOLN
INTRAMUSCULAR | Status: DC | PRN
  Administered 2014-02-22: 50 ug via INTRAVENOUS
  Administered 2014-02-22: 100 ug via INTRAVENOUS

## 2014-02-22 MED ORDER — ONDANSETRON HCL 4 MG/2ML IJ SOLN
INTRAMUSCULAR | Status: DC | PRN
  Administered 2014-02-22: 4 mg via INTRAVENOUS

## 2014-02-22 MED ORDER — CHLORHEXIDINE GLUCONATE 4 % EX LIQD: 60.0000 mL | Freq: Once | CUTANEOUS | Status: DC

## 2014-02-22 MED ORDER — ROCURONIUM BROMIDE 100 MG/10ML IV SOLN
INTRAVENOUS | Status: AC
  Filled 2014-02-22: qty 1

## 2014-02-22 MED ORDER — ONDANSETRON HCL 4 MG/2ML IJ SOLN
INTRAMUSCULAR | Status: AC
  Filled 2014-02-22: qty 2

## 2014-02-22 MED ORDER — BUPIVACAINE HCL (PF) 0.25 % IJ SOLN
INTRAMUSCULAR | Status: DC | PRN
  Administered 2014-02-22: 10 mL

## 2014-02-22 MED ORDER — ONDANSETRON HCL 4 MG PO TABS: 4.0000 mg | ORAL_TABLET | Freq: Four times a day (QID) | ORAL | Status: DC | PRN

## 2014-02-22 MED ORDER — LABETALOL HCL 5 MG/ML IV SOLN
5.0000 mg | Freq: Once | INTRAVENOUS | Status: AC
  Administered 2014-02-22: 5 mg via INTRAVENOUS
  Filled 2014-02-22: qty 4

## 2014-02-22 MED ORDER — BUPIVACAINE LIPOSOME 1.3 % IJ SUSP
20.0000 mL | Freq: Once | INTRAMUSCULAR | Status: AC
Start: 2014-02-22 — End: 2014-02-22
  Administered 2014-02-22: 40 mL
  Filled 2014-02-22: qty 20

## 2014-02-22 MED ORDER — LACTATED RINGERS IV SOLN
INTRAVENOUS | Status: DC | PRN
  Administered 2014-02-22 (×2): via INTRAVENOUS

## 2014-02-22 MED ORDER — SODIUM CHLORIDE 0.9 % IJ SOLN
INTRAMUSCULAR | Status: DC | PRN
  Administered 2014-02-22: 20 mL

## 2014-02-22 MED ORDER — MEPERIDINE HCL 50 MG/ML IJ SOLN: 6.2500 mg | INTRAMUSCULAR | Status: DC | PRN

## 2014-02-22 MED ORDER — SODIUM CHLORIDE 0.9 % IR SOLN
Status: AC
  Filled 2014-02-22: qty 1

## 2014-02-22 MED ORDER — CEFAZOLIN SODIUM 1-5 GM-% IV SOLN
1.0000 g | Freq: Four times a day (QID) | INTRAVENOUS | Status: AC
  Administered 2014-02-22 – 2014-02-23 (×2): 1 g via INTRAVENOUS
  Filled 2014-02-22 (×2): qty 50

## 2014-02-22 MED ORDER — ACETAMINOPHEN 650 MG RE SUPP: 650.0000 mg | Freq: Four times a day (QID) | RECTAL | Status: DC | PRN

## 2014-02-22 MED ORDER — LABETALOL HCL 5 MG/ML IV SOLN
INTRAVENOUS | Status: AC
  Filled 2014-02-22: qty 4

## 2014-02-22 MED ORDER — PROPOFOL 10 MG/ML IV BOLUS
INTRAVENOUS | Status: DC | PRN
  Administered 2014-02-22: 200 mg via INTRAVENOUS

## 2014-02-22 MED ORDER — FERROUS SULFATE 325 (65 FE) MG PO TABS
325.0000 mg | ORAL_TABLET | Freq: Three times a day (TID) | ORAL | Status: DC
Start: 2014-02-23 — End: 2014-02-27
  Administered 2014-02-23 – 2014-02-27 (×14): 325 mg via ORAL
  Filled 2014-02-22 (×17): qty 1

## 2014-02-22 MED ORDER — ONDANSETRON HCL 4 MG/2ML IJ SOLN: 4.0000 mg | Freq: Four times a day (QID) | INTRAMUSCULAR | Status: DC | PRN

## 2014-02-22 MED ORDER — ROCURONIUM BROMIDE 100 MG/10ML IV SOLN
INTRAVENOUS | Status: DC | PRN
  Administered 2014-02-22: 50 mg via INTRAVENOUS

## 2014-02-22 MED ORDER — PHENOL 1.4 % MT LIQD: 1.0000 | OROMUCOSAL | Status: DC | PRN

## 2014-02-22 MED ORDER — OXYCODONE-ACETAMINOPHEN 5-325 MG PO TABS
2.0000 | ORAL_TABLET | ORAL | Status: DC | PRN
  Administered 2014-02-22 – 2014-02-23 (×3): 2 via ORAL
  Filled 2014-02-22 (×3): qty 2

## 2014-02-22 MED ORDER — GLYCOPYRROLATE 0.2 MG/ML IJ SOLN
INTRAMUSCULAR | Status: DC | PRN
  Administered 2014-02-22: .6 mg via INTRAVENOUS

## 2014-02-22 MED ORDER — HYDROMORPHONE HCL 1 MG/ML IJ SOLN
1.0000 mg | INTRAMUSCULAR | Status: DC | PRN
  Administered 2014-02-22 – 2014-02-25 (×16): 1 mg via INTRAVENOUS
  Filled 2014-02-22 (×15): qty 1

## 2014-02-22 MED ORDER — MENTHOL 3 MG MT LOZG: 1.0000 | LOZENGE | OROMUCOSAL | Status: DC | PRN

## 2014-02-22 MED ORDER — METHOCARBAMOL 500 MG PO TABS
500.0000 mg | ORAL_TABLET | Freq: Four times a day (QID) | ORAL | Status: DC | PRN
  Administered 2014-02-22 – 2014-02-26 (×9): 500 mg via ORAL
  Filled 2014-02-22 (×9): qty 1

## 2014-02-22 MED ORDER — LIDOCAINE HCL (CARDIAC) 20 MG/ML IV SOLN
INTRAVENOUS | Status: AC
  Filled 2014-02-22: qty 5

## 2014-02-22 SURGICAL SUPPLY — 70 items
BAG SPEC THK2 15X12 ZIP CLS (MISCELLANEOUS)
BAG ZIPLOCK 12X15 (MISCELLANEOUS) IMPLANT
BANDAGE ELASTIC 4 VELCRO ST LF (GAUZE/BANDAGES/DRESSINGS) ×3 IMPLANT
BANDAGE ELASTIC 6 VELCRO ST LF (GAUZE/BANDAGES/DRESSINGS) ×3 IMPLANT
BANDAGE ESMARK 6X9 LF (GAUZE/BANDAGES/DRESSINGS) ×1 IMPLANT
BLADE SAG 18X100X1.27 (BLADE) ×3 IMPLANT
BLADE SAW SGTL 11.0X1.19X90.0M (BLADE) ×3 IMPLANT
BNDG CMPR 9X6 STRL LF SNTH (GAUZE/BANDAGES/DRESSINGS) ×1
BNDG ESMARK 6X9 LF (GAUZE/BANDAGES/DRESSINGS) ×3
BONE CEMENT GENTAMICIN (Cement) ×6 IMPLANT
CAP KNEE TOTAL 3 SIGMA ×2 IMPLANT
CEMENT BONE GENTAMICIN 40 (Cement) ×2 IMPLANT
CUFF TOURN SGL QUICK 34 (TOURNIQUET CUFF) ×3
CUFF TRNQT CYL 34X4X40X1 (TOURNIQUET CUFF) ×1 IMPLANT
DRAPE EXTREMITY T 121X128X90 (DRAPE) ×3 IMPLANT
DRAPE INCISE IOBAN 66X45 STRL (DRAPES) ×2 IMPLANT
DRAPE POUCH INSTRU U-SHP 10X18 (DRAPES) ×3 IMPLANT
DRAPE U-SHAPE 47X51 STRL (DRAPES) ×3 IMPLANT
DRSG AQUACEL AG ADV 3.5X10 (GAUZE/BANDAGES/DRESSINGS) ×3 IMPLANT
DRSG PAD ABDOMINAL 8X10 ST (GAUZE/BANDAGES/DRESSINGS) IMPLANT
DRSG TEGADERM 4X4.75 (GAUZE/BANDAGES/DRESSINGS) ×3 IMPLANT
DURAPREP 26ML APPLICATOR (WOUND CARE) ×3 IMPLANT
ELECT REM PT RETURN 9FT ADLT (ELECTROSURGICAL) ×3
ELECTRODE REM PT RTRN 9FT ADLT (ELECTROSURGICAL) ×1 IMPLANT
EVACUATOR 1/8 PVC DRAIN (DRAIN) ×3 IMPLANT
FACESHIELD WRAPAROUND (MASK) ×15 IMPLANT
FACESHIELD WRAPAROUND OR TEAM (MASK) ×5 IMPLANT
GAUZE SPONGE 2X2 8PLY STRL LF (GAUZE/BANDAGES/DRESSINGS) ×1 IMPLANT
GLOVE BIOGEL PI IND STRL 6.5 (GLOVE) ×1 IMPLANT
GLOVE BIOGEL PI IND STRL 8 (GLOVE) ×1 IMPLANT
GLOVE BIOGEL PI INDICATOR 6.5 (GLOVE) ×12
GLOVE BIOGEL PI INDICATOR 8 (GLOVE) ×2
GLOVE ECLIPSE 8.0 STRL XLNG CF (GLOVE) ×6 IMPLANT
GLOVE SURG SS PI 6.5 STRL IVOR (GLOVE) ×3 IMPLANT
GOWN STRL REUS W/TWL LRG LVL3 (GOWN DISPOSABLE) ×5 IMPLANT
GOWN STRL REUS W/TWL XL LVL3 (GOWN DISPOSABLE) ×5 IMPLANT
HANDPIECE INTERPULSE COAX TIP (DISPOSABLE) ×3
IMMOBILIZER KNEE 20 (SOFTGOODS) ×3
IMMOBILIZER KNEE 20 THIGH 36 (SOFTGOODS) ×1 IMPLANT
KIT BASIN OR (CUSTOM PROCEDURE TRAY) ×3 IMPLANT
LIQUID BAND (GAUZE/BANDAGES/DRESSINGS) ×3 IMPLANT
MANIFOLD NEPTUNE II (INSTRUMENTS) ×3 IMPLANT
NDL SAFETY ECLIPSE 18X1.5 (NEEDLE) IMPLANT
NEEDLE HYPO 18GX1.5 SHARP (NEEDLE) ×3
NEEDLE HYPO 22GX1.5 SAFETY (NEEDLE) ×6 IMPLANT
NS IRRIG 1000ML POUR BTL (IV SOLUTION) ×2 IMPLANT
PACK TOTAL JOINT (CUSTOM PROCEDURE TRAY) ×3 IMPLANT
PADDING CAST COTTON 6X4 STRL (CAST SUPPLIES) ×2 IMPLANT
POSITIONER SURGICAL ARM (MISCELLANEOUS) ×3 IMPLANT
SET HNDPC FAN SPRY TIP SCT (DISPOSABLE) ×1 IMPLANT
SET PAD KNEE POSITIONER (MISCELLANEOUS) ×3 IMPLANT
SPONGE GAUZE 2X2 STER 10/PKG (GAUZE/BANDAGES/DRESSINGS) ×2
SPONGE LAP 18X18 X RAY DECT (DISPOSABLE) ×2 IMPLANT
SPONGE SURGIFOAM ABS GEL 100 (HEMOSTASIS) ×3 IMPLANT
STAPLER VISISTAT 35W (STAPLE) IMPLANT
SUCTION FRAZIER 12FR DISP (SUCTIONS) ×3 IMPLANT
SUT BONE WAX W31G (SUTURE) ×3 IMPLANT
SUT MNCRL AB 4-0 PS2 18 (SUTURE) ×3 IMPLANT
SUT VIC AB 1 CT1 27 (SUTURE) ×12
SUT VIC AB 1 CT1 27XBRD ANTBC (SUTURE) ×2 IMPLANT
SUT VIC AB 2-0 CT1 27 (SUTURE) ×12
SUT VIC AB 2-0 CT1 TAPERPNT 27 (SUTURE) ×3 IMPLANT
SUT VLOC 180 0 24IN GS25 (SUTURE) ×3 IMPLANT
SYR 20CC LL (SYRINGE) ×6 IMPLANT
TOWEL OR 17X26 10 PK STRL BLUE (TOWEL DISPOSABLE) ×3 IMPLANT
TOWEL OR NON WOVEN STRL DISP B (DISPOSABLE) ×2 IMPLANT
TOWER CARTRIDGE SMART MIX (DISPOSABLE) ×3 IMPLANT
TRAY FOLEY CATH 14FRSI W/METER (CATHETERS) ×3 IMPLANT
WATER STERILE IRR 1500ML POUR (IV SOLUTION) ×3 IMPLANT
WRAP KNEE MAXI GEL POST OP (GAUZE/BANDAGES/DRESSINGS) ×5 IMPLANT

## 2014-02-22 NOTE — Op Note (Signed)
Andrew Haley, Andrew Haley NO.:  [id number]  MEDICAL RECORD NO.:  [id number]  LOCATION:  5409                         FACILITY:  [facility]  PHYSICIAN:  Kipp Brood. Paxton Kanaan, M.D.DATE OF BIRTH:  [birth year]  DATE OF PROCEDURE:  02/22/2014 DATE OF DISCHARGE:                              OPERATIVE REPORT   SURGEON:  Kipp Brood. Gladstone Lighter, M.D.  ASSISTANT:  Ardeen Jourdain, [place].  PREOPERATIVE DIAGNOSES: 1. Severe primary osteoarthritis with bone-on-bone. 2. Severe genu varus, left knee. 3. Severe flexion contracture, left knee.  POSTOPERATIVE DIAGNOSES: 1. Severe primary osteoarthritis with bone-on-bone. 2. Severe genu varus, left knee. 3. Severe flexion contracture, left knee.  OPERATION:  Left total knee arthroplasty utilizing DePuy system.  All 3 components were cemented and gentamicin was used in the cement.  The sized femur was a size 5 left posterior cruciate sacrificing type.  The tray was a size 5.  The insert was a size 5.  INCOMPLETE DICTATION.          ______________________________ Kipp Brood Gladstone Lighter, M.D.     RAG/MEDQ  D:  02/22/2014  T:  02/22/2014  Job:  [id number]

## 2014-02-22 NOTE — Progress Notes (Signed)
Patient b/p 180/105 jackienbissellpa notified, with orders received  D Tayven Renteria rn

## 2014-02-22 NOTE — Anesthesia Postprocedure Evaluation (Signed)
  Anesthesia Post-op Note  Patient: Andrew Haley  Procedure(s) Performed: Procedure(s) (LRB): LEFT TOTAL KNEE ARTHROPLASTY (Left)  Patient Location: PACU  Anesthesia Type: General  Level of Consciousness: awake and alert   Airway and Oxygen Therapy: Patient Spontanous Breathing  Post-op Pain: mild  Post-op Assessment: Post-op Vital signs reviewed, Patient's Cardiovascular Status Stable, Respiratory Function Stable, Patent Airway and No signs of Nausea or vomiting  Last Vitals:  Filed Vitals:   02/22/14 1700  BP:   Pulse: 88  Temp:   Resp: 19    Post-op Vital Signs: stable   Complications: No apparent anesthesia complications

## 2014-02-22 NOTE — Anesthesia Preprocedure Evaluation (Addendum)
Anesthesia Evaluation  Patient identified by MRN, date of birth, ID band Patient awake    Reviewed: Allergy & Precautions, NPO status , Patient's Chart, lab work & pertinent test results  Airway Mallampati: II  TM Distance: >3 FB Neck ROM: Full    Dental no notable dental hx.    Pulmonary neg pulmonary ROS,  breath sounds clear to auscultation  Pulmonary exam normal       Cardiovascular hypertension, Rhythm:Regular Rate:Normal     Neuro/Psych negative neurological ROS  negative psych ROS   GI/Hepatic negative GI ROS, Neg liver ROS,   Endo/Other  Morbid obesity  Renal/GU negative Renal ROS  negative genitourinary   Musculoskeletal negative musculoskeletal ROS (+)   Abdominal   Peds negative pediatric ROS (+)  Hematology negative hematology ROS (+)   Anesthesia Other Findings   Reproductive/Obstetrics negative OB ROS                            Anesthesia Physical Anesthesia Plan  ASA: III  Anesthesia Plan: General   Post-op Pain Management:    Induction: Intravenous  Airway Management Planned: Oral ETT  Additional Equipment:   Intra-op Plan:   Post-operative Plan: Extubation in OR  Informed Consent: I have reviewed the patients History and Physical, chart, labs and discussed the procedure including the risks, benefits and alternatives for the proposed anesthesia with the patient or authorized representative who has indicated his/her understanding and acceptance.   Dental advisory given  Plan Discussed with: CRNA  Anesthesia Plan Comments:         Anesthesia Quick Evaluation

## 2014-02-22 NOTE — Anesthesia Procedure Notes (Signed)
Procedures

## 2014-02-22 NOTE — Progress Notes (Signed)
Paged Dierdre Highman, PA  re: elevated b/p's. rec'd orders for a one time dose of 5 mg of labetalol. Will continue to monitor.

## 2014-02-22 NOTE — Interval H&P Note (Signed)
History and Physical Interval Note:  02/22/2014 10:50 AM  Andrew Haley  has presented today for surgery, with the diagnosis of LEFT KNEE OA  The various methods of treatment have been discussed with the patient and family. After consideration of risks, benefits and other options for treatment, the patient has consented to  Procedure(s): LEFT TOTAL KNEE ARTHROPLASTY (Left) as a surgical intervention .  The patient's history has been reviewed, patient examined, no change in status, stable for surgery.  I have reviewed the patient's chart and labs.  Questions were answered to the patient's satisfaction.     [name]

## 2014-02-22 NOTE — Progress Notes (Signed)
Utilization review completed.  

## 2014-02-22 NOTE — Brief Op Note (Signed)
02/22/2014  1:55 PM  PATIENT:  Andrew Haley  49 y.o. male  PRE-OPERATIVE DIAGNOSIS:  LEFT KNEE Primary  OA with Severe Genu Varus and Flexion Contracture.  POST-OPERATIVE DIAGNOSIS:  left knee Primary  Osteoarthritis with Severe Genu Varus and Flexion Contracture.  PROCEDURE:  Procedure(s): LEFT TOTAL KNEE ARTHROPLASTY (Left) and Release of Severe Flexion Contractures.  SURGEON:  Surgeon(s) and Role:    * Tobi Bastos, MD - Primary  PHYSICIAN ASSISTANT: Ardeen Jourdain PA  ASSISTANTS:Amber Marion PA  ANESTHESIA:   general  EBL:  Total I/O In: 1000 [I.V.:1000] Out: 100 [Urine:100]  BLOOD ADMINISTERED:none  DRAINS: (one) Hemovact drain(s) in the Left Knee with  Suction Open   LOCAL MEDICATIONS USED:  MARCAINE 20cc of 0.25% plain and 20cc of Exparel mixed with 20cc of Normal Saline.    SPECIMEN:  No Specimen  DISPOSITION OF SPECIMEN:  N/A  COUNTS:  YES  TOURNIQUET:  * Missing tourniquet times found for documented tourniquets in log:  [id number] *  DICTATION: .Other Dictation: Dictation Number [id number]  PLAN OF CARE: Admit to inpatient   PATIENT DISPOSITION:  Stable in OR   Delay start of Pharmacological VTE agent (>24hrs) due to surgical blood loss or risk of bleeding: yes

## 2014-02-22 NOTE — Transfer of Care (Signed)
Immediate Anesthesia Transfer of Care Note  Patient: Andrew Haley  Procedure(s) Performed: Procedure(s): LEFT TOTAL KNEE ARTHROPLASTY (Left)  Patient Location: PACU  Anesthesia Type:General  Level of Consciousness: awake and pateint uncooperative  Airway & Oxygen Therapy: Patient Spontanous Breathing and Patient connected to face mask oxygen  Post-op Assessment: Report given to PACU RN and Post -op Vital signs reviewed and stable  Post vital signs: Reviewed and stable  Complications: No apparent anesthesia complications

## 2014-02-22 NOTE — Plan of Care (Signed)
Problem: Consults Goal: Diagnosis- Total Joint Replacement Left total knee     

## 2014-02-23 ENCOUNTER — Encounter (HOSPITAL_COMMUNITY): Payer: Self-pay | Admitting: Orthopedic Surgery

## 2014-02-23 LAB — CBC
HEMATOCRIT: 35 % — AB (ref 39.0–52.0)
HEMOGLOBIN: 11.4 g/dL — AB (ref 13.0–17.0)
MCH: 27.3 pg (ref 26.0–34.0)
MCHC: 32.6 g/dL (ref 30.0–36.0)
MCV: 83.9 fL (ref 78.0–100.0)
Platelets: 202 10*3/uL (ref 150–400)
RBC: 4.17 MIL/uL — ABNORMAL LOW (ref 4.22–5.81)
RDW: 12.6 % (ref 11.5–15.5)
WBC: 9.4 10*3/uL (ref 4.0–10.5)

## 2014-02-23 LAB — BASIC METABOLIC PANEL
ANION GAP: 8 (ref 5–15)
BUN: 10 mg/dL (ref 6–23)
CO2: 29 mmol/L (ref 19–32)
Calcium: 8.4 mg/dL (ref 8.4–10.5)
Chloride: 97 mEq/L (ref 96–112)
Creatinine, Ser: 0.77 mg/dL (ref 0.50–1.35)
GFR calc Af Amer: >90 mL/min (ref >90)
Glucose, Bld: 138 mg/dL — ABNORMAL HIGH (ref 70–99)
POTASSIUM: 3.7 mmol/L (ref 3.5–5.1)
Sodium: 134 mmol/L — ABNORMAL LOW (ref 135–145)

## 2014-02-23 MED ORDER — DIAZEPAM 5 MG PO TABS
5.0000 mg | ORAL_TABLET | Freq: Three times a day (TID) | ORAL | Status: DC | PRN
  Administered 2014-02-24 – 2014-02-26 (×5): 5 mg via ORAL
  Filled 2014-02-23 (×6): qty 1

## 2014-02-23 MED ORDER — METOPROLOL TARTRATE 25 MG PO TABS
25.0000 mg | ORAL_TABLET | Freq: Two times a day (BID) | ORAL | Status: DC | PRN
  Filled 2014-02-23: qty 1

## 2014-02-23 MED ORDER — HYDROMORPHONE HCL 2 MG PO TABS
4.0000 mg | ORAL_TABLET | ORAL | Status: DC | PRN
  Administered 2014-02-23 – 2014-02-27 (×13): 4 mg via ORAL
  Filled 2014-02-23 (×13): qty 2

## 2014-02-23 MED ORDER — METOPROLOL TARTRATE 25 MG PO TABS: 25.0000 mg | ORAL_TABLET | Freq: Two times a day (BID) | ORAL | Status: DC | PRN

## 2014-02-23 NOTE — Op Note (Signed)
NAMEWALLIS, VANCOTT NO.:  [id number]  MEDICAL RECORD NO.:  [id number]  LOCATION:  7939                         FACILITY:  [facility]  PHYSICIAN:  Kipp Brood. Tarini Carrier, M.D.DATE OF BIRTH:  02-Mar-1965  DATE OF PROCEDURE:  02/22/2014 DATE OF DISCHARGE:                              OPERATIVE REPORT   SURGEON:  Kipp Brood. Gladstone Lighter, MD  ASSISTANT:  Ardeen Jourdain, PA  PREOPERATIVE DIAGNOSES: 1. Severe primary osteoarthritis with bone on bone, left knee. 2. Severe genu varus, left knee. 3. Severe flexion contracture, left knee.  OPERATION: 1. Left total knee arthroplasty utilizing DePuy system, all 3     components were cemented.  The size used was a size 5 left     posterior cruciate sacrificing femoral component, tray was a size 5     tibial tray.  The insert was a size 5, 12.5 mm thickness.  Patella     was a size 41 with 3 pegs.  DESCRIPTION OF PROCEDURE:  Under general anesthesia, routine orthopedic prep and draping of the left lower extremity was carried out.  At this time, appropriate time-out was carried out.  I also marked the appropriate left leg in the holding area.  At this time, the leg was exsanguinated and Esmarch tourniquet was elevated at 325 mmHg.  The knee was flexed.  It was placed in the [facility] knee holder.  An anterior approach of the knee was carried out.  Two flaps were created.  I then carried out a median parapatellar incision.  I reflected the patella laterally.  Note, he had huge spurs to the superior pole of the patella. All the spurs were removed.  He had severe spurs as well in the femoral condyle and distal femur.  I removed those as well.  We then did medial and lateral meniscectomies and incised the anterior and posterior cruciate ligament.  Note, the knee and thigh muscles were extremely tight.  There was extreme contracture medially because of the collapse of the medial joint space and genu varus.  I did a nice medial  release. Following that, initial drill hole was made in the intercondylar notch. Then, a canal finder was inserted.  We thoroughly irrigated out the canal.  I removed 14 mm thickness off the distal femur mainly because of the contractures and the fact that I would like to use a 12.5 insert which could.  Following this, this we measured the femur to be a size 5. We then made our distal cut in the femur for anterior posterior chamfering cuts for a size 5 left femoral component.  Next, attention was directed to tray, tray was measured to be a size 5.  Initial drill holes made in the tibial plateau.  The canal finder was inserted.  We irrigated out the canal.  We then inserted our next jig and removed 8 mm thickness off the affected medial side tibia.  Note, distal knee was extremely tight.  I went back and did further medial release.  He had osteophytic fusion actually at the posterior cruciate ligament as well. We had to remove those spurs as well.  After the cuts were made,  I then noticed that medially he was still high.  We went back and made an additional cut medially to even out that plateau.  Following that, the spacer blocks were inserted.  We had good stability.  We had good position of flexion and extension gap.  We then went on to cut our keel cut out of the tibia in the usual fashion.  We then cut our notch, cut out of the distal femur.  Trial components were inserted.  We were able utilized a 12.5 mm thickness insert.  I then did a resurfacing procedure on the patella.  The patella had to be trimmed from the large amount of the spurs.  The component measured 41 mm.  We made 3 drill holes in the articular surface of the patella.  I then removed all 3 components. Thoroughly, water picked out the knee and cemented all 3 components simultaneously with gentamicin in the cement.  Note preop, he had 3 g of IV Ancef.  At this particular point, we then reduced the knee and held the knee  in place while the cement hardened.  After that, removed all loose pieces of cement.  Removed the trial component, water picked the knee out again to make sure there were no loose pieces of cement.  I inserted some thrombin-soaked Gelfoam posteriorly.  I also inserted a Hemovac drain.  We also injected a 20 mL of 0.25% Marcaine plain into the soft tissue followed by Exparel 20 mL with 20 mL of normal saline. The wound then was closed in layers in the usual fashion.  After we inserted our permanent rotating platform size 5 12.5 mm thickness insert.          ______________________________ Kipp Brood. Gladstone Lighter, M.D.     RAG/MEDQ  D:  02/22/2014  T:  02/22/2014  Job:  [id number]

## 2014-02-23 NOTE — Progress Notes (Signed)
Subjective: 1 Day Post-Op Procedure(s) (LRB): LEFT TOTAL KNEE ARTHROPLASTY (Left) Patient reports pain as 8 on 0-10 scale.Hemovac DCd. His knee is distended.Excellent Dorsalis Pulse and dorsiflexion of his foot. His main problem is a knee effusion and he was extremely anxious Pre-Op.    Objective: Vital signs in last 24 hours: Temp:  [97.7 F (36.5 C)-99.8 F (37.7 C)] 98.6 F (37 C) (01/14 0552) Pulse Rate:  [81-115] 93 (01/14 0621) Resp:  [8-19] 18 (01/14 0552) BP: (120-180)/(82-128) 163/95 mmHg (01/14 0552) SpO2:  [84 %-100 %] 99 % (01/14 0552) Weight:  [144.244 kg (318 lb)] 144.244 kg (318 lb) (01/13 1733)  Intake/Output from previous day: 01/13 0701 - 01/14 0700 In: 4183.4 [P.O.:480; I.V.:3603.4; IV Piggyback:100] Out: 1640 [Urine:1500; Drains:140] Intake/Output this shift:     Recent Labs  02/23/14 0435  HGB 11.4*    Recent Labs  02/23/14 0435  WBC 9.4  RBC 4.17*  HCT 35.0*  PLT 202    Recent Labs  02/23/14 0435  NA 134*  K 3.7  CL 97  CO2 29  BUN 10  CREATININE 0.77  GLUCOSE 138*  CALCIUM 8.4   No results for input(s): LABPT, INR in the last 72 hours.  Dorsiflexion/Plantar flexion intact No cellulitis present  Assessment/Plan: 1 Day Post-Op Procedure(s) (LRB): LEFT TOTAL KNEE ARTHROPLASTY (Left) Up with therapy  Dijuan Sleeth A 02/23/2014, 7:27 AM

## 2014-02-23 NOTE — Care Management Note (Addendum)
    Page 1 of 2   02/27/2014     10:43:39 AM CARE MANAGEMENT NOTE 02/27/2014  Patient:  CREEDON, DANIELSKI   Account Number:  [id number]  Date Initiated:  02/23/2014  Documentation initiated by:  [facility]  Subjective/Objective Assessment:   adm: LEFT TOTAL KNEE ARTHROPLASTY (Left)     Action/Plan:   discharge planning   Anticipated DC Date:  02/24/2014   Anticipated DC Plan:  [place]  CM consult      [facility] Choice  HOME HEALTH   Choice offered to / List presented to:  C-1 Patient   DME arranged  [place]      DME agency  [place]     HH arranged  HH-2 PT      [place].   Status of service:  Completed, signed off Medicare Important Message given?  YES (If response is "NO", the following Medicare IM given date fields will be blank) Date Medicare IM given:  02/27/2014 Medicare IM given by:  [facility] Date Additional Medicare IM given:   Additional Medicare IM given by:    Discharge Disposition:  [place]  Per UR Regulation:  Reviewed for med. necessity/level of care/duration of stay  If discussed at [place] of Stay Meetings, dates discussed:    Comments:  02-28-04 Sunday [place] RN CM 1000 Spoke with patient at bedside, now wants to return home as originally planned. Still has not recieved his RW. Contacted [employer], they were unable to collect copay on 1/15 so that is why it was not delivered. Spoke with patient and he is okay with it being delivered to home and that will make copay easier. He states he will not have any issues with transportation or getting in the house. Made RN aware of patient wishes, contacted [employer] and they will deliver to home.  02/23/14 14:20 CM met with pt and friend, Marcie Bal in room to offer choice.  Marcie Bal chooses [facility] to render HHPT.  Address home number is [id number].  Janet's number is [phone]. Referral called to [facility] rep, Kristen.   CM  called [facility] for a rolling walker but they state they do not have any in stock (either bari or reg).  CM called [employer] to arrange for a rolling waker appropriate for 6'1 and 318#. [employer] requests I fax facesheet, PT EVAL, OP note, H&P, Orders to [phone].  All aforementioned information faxed.  CM has requested the rolling walker be delivered to the pt's room in the am of 02/24/14.  No other CM needs were communicated.  Mariane Masters, BSN, [place].

## 2014-02-23 NOTE — Progress Notes (Signed)
OT Cancellation Note  Patient Details Name: TORI CUPPS MRN: [id number] DOB: 21-Oct-1965   Cancelled Treatment:    Reason Eval/Treat Not Completed: Other (comment) Spoke with PT who states pt is limited currently by muscles spasms which is making mobility difficult. Will check on pt next date.  [place], [place] 02/23/2014, 12:24 PM

## 2014-02-23 NOTE — Evaluation (Addendum)
Physical Therapy Evaluation Patient Details Name: Andrew Haley MRN: [id number] DOB: 13-Apr-1965 Today's Date: 02/23/2014   History of Present Illness  [name]  Clinical Impression  Pt s/p [name] presents with decreased L LE strength/ROM and post op pain and muscle spasms limiting functional mobility.  Pt should progress to d.c home with HHPT follow up and assist of friends/neighbors    Follow Up Recommendations Home health PT    Equipment Recommendations  Wide RW - pt is 6'1 and in excess of 300lbs    Recommendations for Other Services OT consult     Precautions / Restrictions Precautions Precautions: Knee;Fall Precaution Comments: pt with severe muscle spasms date of eval Required Braces or Orthoses: Knee Immobilizer - Left Knee Immobilizer - Left: Discontinue once straight leg raise with < 10 degree lag Restrictions Weight Bearing Restrictions: No Other Position/Activity Restrictions: WBAT      Mobility  Bed Mobility Overal bed mobility: Needs Assistance Bed Mobility: Supine to Sit     Supine to sit: Min assist;Mod assist;+2 for safety/equipment;+2 for physical assistance     General bed mobility comments: cues for sequence with pt self assisting with rail.  Physical assist required to manage L LE  Transfers Overall transfer level: Needs assistance Equipment used: Rolling walker (2 wheeled) Transfers: Sit to/from Stand Sit to Stand: From elevated surface;Min assist;Mod assist;+2 physical assistance;+2 safety/equipment Stand pivot transfers: Min assist;Mod assist;+2 physical assistance;+2 safety/equipment       General transfer comment: cues for LE mangement and use of UEs to self assist  Ambulation/Gait Ambulation/Gait assistance: Min assist;Mod assist;+2 physical assistance;+2 safety/equipment Ambulation Distance (Feet): 5 Feet Assistive device: Rolling walker (2 wheeled) Gait Pattern/deviations: Step-to pattern;Decreased step length - right;Decreased step  length - left;Shuffle;Trunk flexed Gait velocity: pt cued to slow down - moving impulsively with increased risk of falling   General Gait Details: cues for posture, position from RW and sequence  Stairs            Wheelchair Mobility    Modified Rankin (Stroke Patients Only)       Balance                                             Pertinent Vitals/Pain Pain Assessment: 0-10 Pain Score: 10-Worst pain ever Pain Location: L thigh/knee Pain Descriptors / Indicators: Spasm Pain Intervention(s): Limited activity within patient's tolerance;Monitored during session;Premedicated before session;Ice applied;Patient requesting pain meds-RN notified    Home Living Family/patient expects to be discharged to:: Private residence Living Arrangements: Alone Available Help at Discharge: Friend(s) Type of Home: House Home Access: Stairs to enter Entrance Stairs-Rails: None Entrance Stairs-Number of Steps: 1 Home Layout: One level Home Equipment: Environmental consultant - none      Prior Function Level of Independence: Independent               Hand Dominance   Dominant Hand: Right    Extremity/Trunk Assessment   Upper Extremity Assessment: Overall WFL for tasks assessed           Lower Extremity Assessment: LLE deficits/detail      Cervical / Trunk Assessment: Normal  Communication   Communication: No difficulties  Cognition Arousal/Alertness: Awake/alert Behavior During Therapy: WFL for tasks assessed/performed Overall Cognitive Status: Within Functional Limits for tasks assessed  General Comments      Exercises        Assessment/Plan    PT Assessment Patient needs continued PT services  PT Diagnosis Difficulty walking   PT Problem List Decreased strength;Decreased activity tolerance;Decreased range of motion;Decreased mobility;Decreased knowledge of use of DME;Obesity;Pain;Decreased knowledge of precautions  PT  Treatment Interventions DME instruction;Gait training;Stair training;Functional mobility training;Therapeutic activities;Therapeutic exercise;Patient/family education   PT Goals (Current goals can be found in the Care Plan section) Acute Rehab PT Goals Patient Stated Goal: Resume previous lifestyle with decreased pain PT Goal Formulation: With patient Time For Goal Achievement: 03/02/14 Potential to Achieve Goals: Good    Frequency 7X/week   Barriers to discharge        Co-evaluation               End of Session Equipment Utilized During Treatment: Right knee immobilizer Activity Tolerance: Patient limited by pain Patient left: in chair;with call bell/phone within reach Nurse Communication: Mobility status;Patient requests pain meds         Time: 3888-2800 PT Time Calculation (min) (ACUTE ONLY): 38 min   Charges:   PT Evaluation $Initial PT Evaluation Tier I: 1 Procedure PT Treatments $Gait Training: 8-22 mins $Therapeutic Activity: 8-22 mins   PT G Codes:        Tajanay Hurley 03-15-2014, 1:05 PM

## 2014-02-23 NOTE — Progress Notes (Signed)
Physical Therapy Treatment Patient Details Name: Andrew Haley MRN: [id number] DOB: February 24, 1965 Today's Date: 02/23/2014    History of Present Illness [name]    PT Comments    Pt agreeable to participate but very ltd by severe muscle spasms throughout session.   Follow Up Recommendations  Home health PT     Equipment Recommendations  None recommended by PT    Recommendations for Other Services OT consult     Precautions / Restrictions Precautions Precautions: Knee;Fall Precaution Comments: pt with severe muscle spasms date of eval Required Braces or Orthoses: Knee Immobilizer - Left Knee Immobilizer - Left: Discontinue once straight leg raise with < 10 degree lag Restrictions Weight Bearing Restrictions: No Other Position/Activity Restrictions: WBAT    Mobility  Bed Mobility Overal bed mobility: Needs Assistance Bed Mobility: Sit to Supine     Supine to sit: Min assist;Mod assist;+2 for safety/equipment;+2 for physical assistance Sit to supine: Min assist;Mod assist   General bed mobility comments: cues for sequence with pt self assisting with rail.  Physical assist required to manage L LE  Transfers Overall transfer level: Needs assistance Equipment used: Rolling walker (2 wheeled) Transfers: Sit to/from Stand Sit to Stand: Min assist;Mod assist;+2 physical assistance;+2 safety/equipment Stand pivot transfers: Min assist;Mod assist;+2 physical assistance;+2 safety/equipment       General transfer comment: cues for LE mangement and use of UEs to self assist  Ambulation/Gait Ambulation/Gait assistance: Min assist;Mod assist Ambulation Distance (Feet): 5 Feet Assistive device: Rolling walker (2 wheeled) Gait Pattern/deviations: Step-to pattern;Decreased step length - right;Decreased step length - left;Shuffle;Decreased stance time - left Gait velocity: pt cued to slow down - moving impulsively with increased risk of falling   General Gait Details: cues for  posture, position from RW and sequence   Stairs            Wheelchair Mobility    Modified Rankin (Stroke Patients Only)       Balance                                    Cognition Arousal/Alertness: Awake/alert Behavior During Therapy: WFL for tasks assessed/performed Overall Cognitive Status: Within Functional Limits for tasks assessed                      Exercises      General Comments        Pertinent Vitals/Pain Pain Assessment: 0-10 Pain Score: 10-Worst pain ever Pain Location: L knee/thigh Pain Descriptors / Indicators: Spasm Pain Intervention(s): Limited activity within patient's tolerance;Monitored during session;Premedicated before session;Ice applied;Patient requesting pain meds-RN notified    Home Living Family/patient expects to be discharged to:: Private residence Living Arrangements: Alone Available Help at Discharge: Friend(s) Type of Home: House Home Access: Stairs to enter Entrance Stairs-Rails: None Home Layout: One level Home Equipment: Environmental consultant - 2 wheels      Prior Function Level of Independence: Independent          PT Goals (current goals can now be found in the care plan section) Acute Rehab PT Goals Patient Stated Goal: Resume previous lifestyle with decreased pain PT Goal Formulation: With patient Time For Goal Achievement: 03/02/14 Potential to Achieve Goals: Good Progress towards PT goals: Progressing toward goals    Frequency  7X/week    PT Plan Current plan remains appropriate    Co-evaluation  End of Session Equipment Utilized During Treatment: Right knee immobilizer Activity Tolerance: Patient limited by pain Patient left: in bed;with call bell/phone within reach;with family/visitor present     Time: 7614-7092 PT Time Calculation (min) (ACUTE ONLY): 37 min  Charges:  $Gait Training: 8-22 mins $Therapeutic Activity: 8-22 mins                    G Codes:       Willy Vorce March 24, 2014, 1:11 PM

## 2014-02-23 NOTE — Discharge Instructions (Addendum)
Information on my medicine - XARELTO (Rivaroxaban)  This medication education was reviewed with me or my healthcare representative as part of my discharge preparation.  The pharmacist that spoke with me during my hospital stay was:  [facility]  Why was Xarelto prescribed for you? Xarelto was prescribed for you to reduce the risk of blood clots forming after orthopedic surgery. The medical term for these abnormal blood clots is venous thromboembolism (VTE).  What do you need to know about xarelto ? Take your Xarelto ONCE DAILY at the same time every day. You may take it either with or without food.  If you have difficulty swallowing the tablet whole, you may crush it and mix in applesauce just prior to taking your dose.  Take Xarelto exactly as prescribed by your doctor and DO NOT stop taking Xarelto without talking to the doctor who prescribed the medication.  Stopping without other VTE prevention medication to take the place of Xarelto may increase your risk of developing a clot.  After discharge, you should have regular check-up appointments with your healthcare provider that is prescribing your Xarelto.    What do you do if you miss a dose? If you miss a dose, take it as soon as you remember on the same day then continue your regularly scheduled once daily regimen the next day. Do not take two doses of Xarelto on the same day.   Important Safety Information A possible side effect of Xarelto is bleeding. You should call your healthcare provider right away if you experience any of the following: ? Bleeding from an injury or your nose that does not stop. ? Unusual colored urine (red or dark brown) or unusual colored stools (red or black). ? Unusual bruising for unknown reasons. ? A serious fall or if you hit your head (even if there is no bleeding).  Some medicines may interact with Xarelto and might increase your risk of bleeding while on Xarelto. To help avoid  this, consult your healthcare provider or pharmacist prior to using any new prescription or non-prescription medications, including herbals, vitamins, non-steroidal anti-inflammatory drugs (NSAIDs) and supplements.  This website has more information on Xarelto: [email or web address]  Walk with your walker. Weight bearing as tolerated [place] will follow you at home for your therapy  Do not change your dressing unless it appears compromised Shower only, no tub bath. Call if any temperatures greater than 101 or any wound complications: [phone] during the day and ask for Dr. Charlestine Night nurse, Brunilda Payor.

## 2014-02-24 LAB — BASIC METABOLIC PANEL
Anion gap: 9 (ref 5–15)
BUN: 11 mg/dL (ref 6–23)
CO2: 29 mmol/L (ref 19–32)
CREATININE: 0.67 mg/dL (ref 0.50–1.35)
Calcium: 8.3 mg/dL — ABNORMAL LOW (ref 8.4–10.5)
Chloride: 88 mEq/L — ABNORMAL LOW (ref 96–112)
GFR calc Af Amer: >90 mL/min (ref >90)
GFR calc non Af Amer: >90 mL/min (ref >90)
Glucose, Bld: 119 mg/dL — ABNORMAL HIGH (ref 70–99)
Potassium: 3.4 mmol/L — ABNORMAL LOW (ref 3.5–5.1)
Sodium: 126 mmol/L — ABNORMAL LOW (ref 135–145)

## 2014-02-24 LAB — CBC
HCT: 33.5 % — ABNORMAL LOW (ref 39.0–52.0)
HEMOGLOBIN: 11.1 g/dL — AB (ref 13.0–17.0)
MCH: 27.1 pg (ref 26.0–34.0)
MCHC: 33.1 g/dL (ref 30.0–36.0)
MCV: 81.9 fL (ref 78.0–100.0)
Platelets: 190 10*3/uL (ref 150–400)
RBC: 4.09 MIL/uL — ABNORMAL LOW (ref 4.22–5.81)
RDW: 12.5 % (ref 11.5–15.5)
WBC: 11.7 10*3/uL — ABNORMAL HIGH (ref 4.0–10.5)

## 2014-02-24 MED ORDER — METHOCARBAMOL 500 MG PO TABS: 500.0000 mg | ORAL_TABLET | Freq: Four times a day (QID) | ORAL | Status: DC | PRN

## 2014-02-24 MED ORDER — HYDROMORPHONE HCL 4 MG PO TABS: 4.0000 mg | ORAL_TABLET | ORAL | Status: DC | PRN

## 2014-02-24 MED ORDER — RIVAROXABAN 10 MG PO TABS: 10.0000 mg | ORAL_TABLET | Freq: Every day | ORAL | Status: DC

## 2014-02-24 MED ORDER — POTASSIUM CHLORIDE CRYS ER 20 MEQ PO TBCR
30.0000 meq | EXTENDED_RELEASE_TABLET | Freq: Two times a day (BID) | ORAL | Status: DC
  Administered 2014-02-24 – 2014-02-27 (×7): 30 meq via ORAL
  Filled 2014-02-24 (×8): qty 1

## 2014-02-24 NOTE — Progress Notes (Addendum)
Physical Therapy Treatment Patient Details Name: SISTO GRANILLO MRN: [id number] DOB: 03-01-1965 Today's Date: 02/24/2014    History of Present Illness [name]    PT Comments    Progressing slowly with mobility.  Pt continues ltd by pain with min WB tolerance on L LE  Follow Up Recommendations  Home health PT     Equipment Recommendations  None recommended by PT    Recommendations for Other Services OT consult     Precautions / Restrictions Precautions Precautions: Knee;Fall Required Braces or Orthoses: Knee Immobilizer - Left Knee Immobilizer - Left: Discontinue once straight leg raise with < 10 degree lag Restrictions Weight Bearing Restrictions: No Other Position/Activity Restrictions: WBAT    Mobility  Bed Mobility Overal bed mobility: Needs Assistance Bed Mobility: Sit to Supine       Sit to supine: Min assist   General bed mobility comments: cues for sequence with pt self assisting with rail.  Physical assist required to manage L LE  Transfers Overall transfer level: Needs assistance Equipment used: Rolling walker (2 wheeled) Transfers: Sit to/from Stand Sit to Stand: Min assist;+2 safety/equipment         General transfer comment: cues for LE mangement and use of UEs to self assist  Ambulation/Gait Ambulation/Gait assistance: Min assist Ambulation Distance (Feet): 32 Feet Assistive device: Rolling walker (2 wheeled) Gait Pattern/deviations: Step-to pattern;Decreased step length - right;Decreased step length - left;Shuffle;Trunk flexed Gait velocity: pt cued to slow down - moving impulsively with increased risk of falling   General Gait Details: cues for posture, position from RW and sequence   Stairs            Wheelchair Mobility    Modified Rankin (Stroke Patients Only)       Balance                                    Cognition Arousal/Alertness: Awake/alert Behavior During Therapy: WFL for tasks  assessed/performed Overall Cognitive Status: Within Functional Limits for tasks assessed                      Exercises Total Joint Exercises Ankle Circles/Pumps: AROM;Both;15 reps;Supine    General Comments        Pertinent Vitals/Pain Pain Assessment: 0-10 Pain Score: 7  Pain Location: R knee Pain Descriptors / Indicators: Aching;Sore Pain Intervention(s): Limited activity within patient's tolerance;Monitored during session;Premedicated before session;Ice applied;Patient requesting pain meds-RN notified    Home Living                      Prior Function            PT Goals (current goals can now be found in the care plan section) Acute Rehab PT Goals Patient Stated Goal: Resume previous lifestyle with decreased pain PT Goal Formulation: With patient Time For Goal Achievement: 03/02/14 Potential to Achieve Goals: Good Progress towards PT goals: Progressing toward goals    Frequency  7X/week    PT Plan Current plan remains appropriate    Co-evaluation             End of Session Equipment Utilized During Treatment: Right knee immobilizer Activity Tolerance: Patient limited by pain Patient left: with call bell/phone within reach;in bed     Time: 1325-1355 PT Time Calculation (min) (ACUTE ONLY): 30 min  Charges:  $Gait Training: 23-37 mins  G Codes:      Rayonna Heldman March 03, 2014, 3:23 PM

## 2014-02-24 NOTE — Progress Notes (Signed)
OT Cancellation Note  Patient Details Name: Andrew Haley MRN: [id number] DOB: 10/04/1965   Cancelled Treatment:    Reason Eval/Treat Not Completed: Other (comment). Attempted to see pt earlier and he had just gotten pain medication.  Now back in bed. Will check back in the morning.  Judythe Postema 02/24/2014, 2:19 PM  Lesle Chris, OTR/L [phone] 02/24/2014

## 2014-02-24 NOTE — Progress Notes (Signed)
Subjective: 2 Days Post-Op Procedure(s) (LRB): LEFT TOTAL KNEE ARTHROPLASTY (Left) Patient reports pain as 5 on 0-10 scale.Doing better today but still has a Significant Hemarthrosis in his knee. Good circulation innhis foot. Will keep another day for pain control.HBg11.1    Objective: Vital signs in last 24 hours: Temp:  [97.8 F (36.6 C)-99.1 F (37.3 C)] 98.4 F (36.9 C) (01/15 0630) Pulse Rate:  [94-103] 103 (01/15 0630) Resp:  [16-20] 18 (01/15 0630) BP: (142-180)/(87-101) 142/92 mmHg (01/15 0630) SpO2:  [95 %-100 %] 95 % (01/15 0630)  Intake/Output from previous day: 01/14 0701 - 01/15 0700 In: 2257 [P.O.:520; I.V.:1737] Out: 3050 [Urine:3050] Intake/Output this shift:     Recent Labs  02/23/14 0435 02/24/14 0445  HGB 11.4* 11.1*    Recent Labs  02/23/14 0435 02/24/14 0445  WBC 9.4 11.7*  RBC 4.17* 4.09*  HCT 35.0* 33.5*  PLT 202 190    Recent Labs  02/23/14 0435 02/24/14 0445  NA 134* 126*  K 3.7 3.4*  CL 97 88*  CO2 29 29  BUN 10 11  CREATININE 0.77 0.67  GLUCOSE 138* 119*  CALCIUM 8.4 8.3*   No results for input(s): LABPT, INR in the last 72 hours.  Neurovascular intact Dorsiflexion/Plantar flexion intact Compartment soft  Assessment/Plan: 2 Days Post-Op Procedure(s) (LRB): LEFT TOTAL KNEE ARTHROPLASTY (Left) Up with therapy and DC tomorrow.  Aerith Canal A 02/24/2014, 7:11 AM

## 2014-02-24 NOTE — Progress Notes (Signed)
Physical Therapy Treatment Patient Details Name: Andrew Haley MRN: [id number] DOB: March 06, 1965 Today's Date: 02/24/2014    History of Present Illness [name]    PT Comments    Progressing slowly.  Pt continues ltd by pain and is tolerating min ROM and min WB on L LE  Follow Up Recommendations  Home health PT     Equipment Recommendations  None recommended by PT    Recommendations for Other Services OT consult     Precautions / Restrictions Precautions Precautions: Knee;Fall Required Braces or Orthoses: Knee Immobilizer - Left Knee Immobilizer - Left: Discontinue once straight leg raise with < 10 degree lag Restrictions Weight Bearing Restrictions: No Other Position/Activity Restrictions: WBAT    Mobility  Bed Mobility Overal bed mobility: Needs Assistance Bed Mobility: Supine to Sit     Supine to sit: Min assist     General bed mobility comments: cues for sequence with pt self assisting with rail.  Physical assist required to manage L LE  Transfers Overall transfer level: Needs assistance Equipment used: Rolling walker (2 wheeled) Transfers: Sit to/from Stand Sit to Stand: Min assist;From elevated surface;+2 safety/equipment         General transfer comment: cues for LE mangement and use of UEs to self assist  Ambulation/Gait Ambulation/Gait assistance: Min assist;+2 safety/equipment Ambulation Distance (Feet): 16 Feet Assistive device: Rolling walker (2 wheeled) Gait Pattern/deviations: Step-to pattern;Decreased step length - right;Decreased step length - left;Shuffle;Antalgic;Decreased stance time - left;Trunk flexed Gait velocity: pt cued to slow down - moving impulsively with increased risk of falling   General Gait Details: cues for posture, position from RW and sequence   Stairs            Wheelchair Mobility    Modified Rankin (Stroke Patients Only)       Balance                                    Cognition  Arousal/Alertness: Awake/alert Behavior During Therapy: WFL for tasks assessed/performed Overall Cognitive Status: Within Functional Limits for tasks assessed                      Exercises Total Joint Exercises Ankle Circles/Pumps: AROM;Both;15 reps;Supine Quad Sets: AROM;Both;10 reps;Supine Heel Slides: AAROM;Left;Supine;Other (comment) (1 rep - unable to continue 2* pain) Straight Leg Raises: AAROM;10 reps;Left;Supine    General Comments        Pertinent Vitals/Pain Pain Assessment: 0-10 Pain Score: 8  Pain Location: R knee and thigh Pain Descriptors / Indicators: Aching;Sore;Spasm Pain Intervention(s): Limited activity within patient's tolerance;Monitored during session;Premedicated before session;Ice applied;Patient requesting pain meds-RN notified;RN gave pain meds during session    Home Living                      Prior Function            PT Goals (current goals can now be found in the care plan section) Acute Rehab PT Goals Patient Stated Goal: Resume previous lifestyle with decreased pain PT Goal Formulation: With patient Time For Goal Achievement: 03/02/14 Potential to Achieve Goals: Good Progress towards PT goals: Progressing toward goals    Frequency  7X/week    PT Plan Current plan remains appropriate    Co-evaluation             End of Session Equipment Utilized During Treatment: Right knee immobilizer Activity  Tolerance: Patient limited by pain Patient left: in chair;with call bell/phone within reach;with nursing/sitter in room     Time: 1005-1040 PT Time Calculation (min) (ACUTE ONLY): 35 min  Charges:  $Gait Training: 8-22 mins $Therapeutic Exercise: 8-22 mins                    G Codes:      Andrew Haley 2014/03/10, 12:42 PM

## 2014-02-25 LAB — CBC
HEMATOCRIT: 31.2 % — AB (ref 39.0–52.0)
HEMOGLOBIN: 10.2 g/dL — AB (ref 13.0–17.0)
MCH: 27 pg (ref 26.0–34.0)
MCHC: 32.7 g/dL (ref 30.0–36.0)
MCV: 82.5 fL (ref 78.0–100.0)
Platelets: 229 10*3/uL (ref 150–400)
RBC: 3.78 MIL/uL — ABNORMAL LOW (ref 4.22–5.81)
RDW: 12.9 % (ref 11.5–15.5)
WBC: 10.5 10*3/uL (ref 4.0–10.5)

## 2014-02-25 MED ORDER — POLYETHYLENE GLYCOL 3350 17 G PO PACK
17.0000 g | PACK | Freq: Every day | ORAL | Status: DC
  Administered 2014-02-25 – 2014-02-27 (×4): 17 g via ORAL

## 2014-02-25 MED ORDER — DOCUSATE SODIUM 100 MG PO CAPS
100.0000 mg | ORAL_CAPSULE | Freq: Two times a day (BID) | ORAL | Status: DC
  Administered 2014-02-25 – 2014-02-27 (×4): 100 mg via ORAL

## 2014-02-25 NOTE — Progress Notes (Signed)
Physical Therapy Treatment Patient Details Name: Andrew Haley MRN: [id number] DOB: 12-Apr-1965 Today's Date: 02/25/2014    History of Present Illness [name]    PT Comments    Pt continues to progress slowly, ltd by pain, limited WB tolerance on L LE, and spasms in L LE and bil triceps with WB through RW.  Pt will benefit from follow up rehab at SNF level to maximize IND and safety prior to return home with limited assist.  Follow Up Recommendations  SNF     Equipment Recommendations  Rolling walker with 5" wheels    Recommendations for Other Services OT consult     Precautions / Restrictions Precautions Precautions: Knee;Fall Required Braces or Orthoses: Knee Immobilizer - Left Knee Immobilizer - Left: Discontinue once straight leg raise with < 10 degree lag Restrictions Weight Bearing Restrictions: No Other Position/Activity Restrictions: WBAT    Mobility  Bed Mobility Overal bed mobility: Needs Assistance         Sit to supine: Min assist   General bed mobility comments: cues for sequence and use of R LE to self assist.  Physical assist to manage L LE  Transfers Overall transfer level: Needs assistance Equipment used: Rolling walker (2 wheeled) Transfers: Sit to/from Stand Sit to Stand: Mod assist         General transfer comment: cues for LE management and use of UEs to self assist.  Ambulation/Gait Ambulation/Gait assistance: Min assist Ambulation Distance (Feet): 40 Feet (and 5' from chair to bed) Assistive device: Rolling walker (2 wheeled) Gait Pattern/deviations: Step-to pattern;Shuffle;Trunk flexed;Antalgic;Decreased stance time - left Gait velocity: pt cued to slow down - moving impulsively with increased risk of falling   General Gait Details: cues for posture, position from RW and sequence   Stairs            Wheelchair Mobility    Modified Rankin (Stroke Patients Only)       Balance                                    Cognition Arousal/Alertness: Awake/alert Behavior During Therapy: WFL for tasks assessed/performed Overall Cognitive Status: Within Functional Limits for tasks assessed                      Exercises      General Comments        Pertinent Vitals/Pain Pain Assessment: 0-10 Pain Score: 6  Pain Location: L knee Pain Descriptors / Indicators: Aching;Sore Pain Intervention(s): Limited activity within patient's tolerance;Monitored during session;Premedicated before session;Ice applied    Home Living                      Prior Function            PT Goals (current goals can now be found in the care plan section) Acute Rehab PT Goals Patient Stated Goal: Resume previous lifestyle with decreased pain PT Goal Formulation: With patient Time For Goal Achievement: 03/02/14 Potential to Achieve Goals: Good Progress towards PT goals: Progressing toward goals    Frequency  7X/week    PT Plan Current plan remains appropriate    Co-evaluation             End of Session Equipment Utilized During Treatment: Right knee immobilizer Activity Tolerance: Patient limited by pain Patient left: in bed;with call bell/phone within reach  Time: 0947-0962 PT Time Calculation (min) (ACUTE ONLY): 23 min  Charges:  $Gait Training: 8-22 mins $Therapeutic Activity: 8-22 mins                    G Codes:      Brittanny Levenhagen 03-11-14, 4:46 PM

## 2014-02-25 NOTE — Evaluation (Signed)
Occupational Therapy Evaluation Patient Details Name: Andrew Haley MRN: [id number] DOB: 1965-06-27 Today's Date: 02/25/2014    History of Present Illness [name]   Clinical Impression   Pt is s/p TKA resulting in the deficits listed below (see OT Problem List).  Pt will benefit from skilled OT to increase their safety and independence with ADL and functional mobility for ADL to facilitate discharge to venue listed below.        Follow Up Recommendations  SNF    Equipment Recommendations  None recommended by OT    Recommendations for Other Services       Precautions / Restrictions Precautions Precautions: Knee;Fall Required Braces or Orthoses: Knee Immobilizer - Left Knee Immobilizer - Left: Discontinue once straight leg raise with < 10 degree lag Restrictions Weight Bearing Restrictions: No Other Position/Activity Restrictions: WBAT      Mobility Bed Mobility               General bed mobility comments: pti n chair  Transfers Overall transfer level: Needs assistance Equipment used: Rolling walker (2 wheeled) Transfers: Sit to/from Stand Sit to Stand: Mod assist         General transfer comment: pt screamed in pain during transition    Balance                                            ADL Overall ADL's : Needs assistance/impaired Eating/Feeding: Set up;Sitting   Grooming: Set up;Sitting   Upper Body Bathing: Set up;Sitting   Lower Body Bathing: Total assistance;Sit to/from stand   Upper Body Dressing : Sitting   Lower Body Dressing: Sit to/from stand;Total assistance   Toilet Transfer: RW;Maximal assistance Toilet Transfer Details (indicate cue type and reason): sit to stand only- urinal Toileting- Clothing Manipulation and Hygiene: Sit to/from stand;Total assistance         General ADL Comments: pt needs and is agreeable to SNF               Pertinent Vitals/Pain Pain Assessment: 0-10 Pain Score: 6  Pain  Location: r knee Pain Descriptors / Indicators: Aching;Sore Pain Intervention(s): Monitored during session;Limited activity within patient's tolerance;Repositioned;Ice applied     Hand Dominance Right   Extremity/Trunk Assessment Upper Extremity Assessment Upper Extremity Assessment: Overall WFL for tasks assessed           Communication Communication Communication: No difficulties   Cognition Arousal/Alertness: Awake/alert Behavior During Therapy: WFL for tasks assessed/performed Overall Cognitive Status: Within Functional Limits for tasks assessed                     General Comments               Home Living Family/patient expects to be discharged to:: Private residence Living Arrangements: Alone Available Help at Discharge: Friend(s) Type of Home: House Home Access: Stairs to enter Technical brewer of Steps: 1 Entrance Stairs-Rails: None Home Layout: One level     Bathroom Shower/Tub: Walk-in shower         Home Equipment: Environmental consultant - 2 wheels          Prior Functioning/Environment Level of Independence: Independent             OT Diagnosis: Generalized weakness;Acute pain   OT Problem List: Decreased strength;Pain   OT Treatment/Interventions: Self-care/ADL training;DME and/or AE instruction;Patient/family education  OT Goals(Current goals can be found in the care plan section) Acute Rehab OT Goals Patient Stated Goal: Resume previous lifestyle with decreased pain OT Goal Formulation: With patient Time For Goal Achievement: 03/11/14 Potential to Achieve Goals: Good  OT Frequency: Min 2X/week   Barriers to D/C: Decreased caregiver support          Co-evaluation              End of Session Nurse Communication: Mobility status  Activity Tolerance: Patient limited by pain Patient left: in chair;with call bell/phone within reach   Time: 1015-1036 OT Time Calculation (min): 21 min Charges:  OT General Charges $OT  Visit: 1 Procedure OT Evaluation $Initial OT Evaluation Tier I: 1 Procedure OT Treatments $Self Care/Home Management : 8-22 mins G-Codes:    Payton Mccallum D 24-Mar-2014, 10:45 AM

## 2014-02-25 NOTE — Progress Notes (Signed)
   Subjective: 3 Days Post-Op Procedure(s) (LRB): LEFT TOTAL KNEE ARTHROPLASTY (Left)  Pt c/o moderate pain and tightness to right knee Spoke with therapy and discussed need for more time before d/c Patient reports pain as moderate.  Objective:   VITALS:   Filed Vitals:   02/25/14 0605  BP: 126/73  Pulse: 85  Temp: 97.5 F (36.4 C)  Resp: 20    Left knee incision healing well nv intact distally Mild edema to knee and pretibial area Pain with rom  LABS  Recent Labs  02/23/14 0435 02/24/14 0445 02/25/14 0517  HGB 11.4* 11.1* 10.2*  HCT 35.0* 33.5* 31.2*  WBC 9.4 11.7* 10.5  PLT 202 190 229     Recent Labs  02/23/14 0435 02/24/14 0445  NA 134* 126*  K 3.7 3.4*  BUN 10 11  CREATININE 0.77 0.67  GLUCOSE 138* 119*     Assessment/Plan: 3 Days Post-Op Procedure(s) (LRB): LEFT TOTAL KNEE ARTHROPLASTY (Left) Pain management Continue PT/OT Possible d/c tomorrow depending on progress     [place], MPAS, PA-C  02/25/2014, 8:14 AM

## 2014-02-25 NOTE — Progress Notes (Signed)
Physical Therapy Treatment Patient Details Name: Andrew Haley MRN: [id number] DOB: 19-Jun-1965 Today's Date: 02/25/2014    History of Present Illness [name]    PT Comments    Pt progressing slowly with mobility and L LE strength/ROM.  Reviewed options with pt and pt willing to consider ST SNF follow up for rehab to maximize IND and safety prior to return home with ltd assist.  Follow Up Recommendations  SNF     Equipment Recommendations  Rolling walker with 5" wheels (Wide)    Recommendations for Other Services OT consult     Precautions / Restrictions Precautions Precautions: Knee;Fall Required Braces or Orthoses: Knee Immobilizer - Left Knee Immobilizer - Left: Discontinue once straight leg raise with < 10 degree lag Restrictions Weight Bearing Restrictions: No Other Position/Activity Restrictions: WBAT    Mobility  Bed Mobility Overal bed mobility: Needs Assistance Bed Mobility: Supine to Sit     Supine to sit: Min assist     General bed mobility comments: pti n chair  Transfers Overall transfer level: Needs assistance Equipment used: Rolling walker (2 wheeled) Transfers: Sit to/from Stand Sit to Stand: Mod assist         General transfer comment: pt screamed in pain during transition  Ambulation/Gait Ambulation/Gait assistance: Min assist;+2 safety/equipment Ambulation Distance (Feet): 39 Feet Assistive device: Rolling walker (2 wheeled) Gait Pattern/deviations: Step-to pattern;Decreased stance time - left;Shuffle;Antalgic;Trunk flexed Gait velocity: pt cued to slow down - moving impulsively with increased risk of falling   General Gait Details: cues for posture, position from RW and sequence   Stairs            Wheelchair Mobility    Modified Rankin (Stroke Patients Only)       Balance                                    Cognition Arousal/Alertness: Awake/alert Behavior During Therapy: WFL for tasks  assessed/performed Overall Cognitive Status: Within Functional Limits for tasks assessed                      Exercises Total Joint Exercises Ankle Circles/Pumps: AROM;Both;15 reps;Supine Quad Sets: AROM;Both;10 reps;Supine Heel Slides: AAROM;Left;Supine;5 reps Straight Leg Raises: AAROM;10 reps;Left;Supine Goniometric ROM: AAROM L knee -18 - 35 - pain ltd    General Comments        Pertinent Vitals/Pain Pain Assessment: 0-10 Pain Score: 6  Pain Location: r knee Pain Descriptors / Indicators: Aching;Sore Pain Intervention(s): Monitored during session;Limited activity within patient's tolerance;Repositioned;Ice applied    Home Living Family/patient expects to be discharged to:: Private residence Living Arrangements: Alone Available Help at Discharge: Friend(s) Type of Home: House Home Access: Stairs to enter Entrance Stairs-Rails: None Home Layout: One level Home Equipment: Environmental consultant - 2 wheels      Prior Function Level of Independence: Independent          PT Goals (current goals can now be found in the care plan section) Acute Rehab PT Goals Patient Stated Goal: Resume previous lifestyle with decreased pain PT Goal Formulation: With patient Time For Goal Achievement: 03/02/14 Potential to Achieve Goals: Good Progress towards PT goals: Progressing toward goals    Frequency  7X/week    PT Plan Discharge plan needs to be updated    Co-evaluation             End of Session Equipment Utilized During Treatment:  Right knee immobilizer Activity Tolerance: Patient limited by pain Patient left: with call bell/phone within reach;in chair     Time: 0925-0959 PT Time Calculation (min) (ACUTE ONLY): 34 min  Charges:  $Gait Training: 8-22 mins $Therapeutic Exercise: 8-22 mins                    G Codes:      Andrew Haley 03/16/2014, 12:36 PM

## 2014-02-26 MED ORDER — CYCLOBENZAPRINE HCL 10 MG PO TABS
5.0000 mg | ORAL_TABLET | Freq: Three times a day (TID) | ORAL | Status: DC | PRN
  Administered 2014-02-26 – 2014-02-27 (×3): 10 mg via ORAL
  Filled 2014-02-26 (×3): qty 1

## 2014-02-26 NOTE — Progress Notes (Signed)
Occupational Therapy Treatment Patient Details Name: Andrew Haley MRN: [id number] DOB: 04/29/1965 Today's Date: 02/26/2014    History of present illness   TKR  OT comments  Improved this visit  Follow Up Recommendations  SNF    Equipment Recommendations  None recommended by OT    Recommendations for Other Services      Precautions / Restrictions Precautions Precautions: Knee;Fall Required Braces or Orthoses: Knee Immobilizer - Left Knee Immobilizer - Left: Discontinue once straight leg raise with < 10 degree lag Restrictions Weight Bearing Restrictions: No Other Position/Activity Restrictions: WBAT       Mobility Bed Mobility Overal bed mobility: Needs Assistance         Sit to supine: Min assist   General bed mobility comments: cues for sequence and use of R LE to self assist.  Physical assist to manage L LE  Transfers Overall transfer level: Needs assistance Equipment used: Rolling walker (2 wheeled) Transfers: Sit to/from [facility] Sit to Stand: Mod assist Stand pivot transfers: +2 physical assistance       General transfer comment: cues for LE management and use of UEs to self assist.    Balance                                   ADL           Upper Body Bathing: Set up;Sitting   Lower Body Bathing: Sit to/from stand;Maximal assistance   Upper Body Dressing : Set up;Sitting   Lower Body Dressing: Maximal assistance;Sit to/from stand   Toilet Transfer: Electronics engineer Details (indicate cue type and reason): sit to stand for urinal Toileting- Clothing Manipulation and Hygiene: Moderate assistance;Sit to/from stand                Vision                     Perception     Praxis      Cognition   Behavior During Therapy: [facility] for tasks assessed/performed Overall Cognitive Status: Within Functional Limits for tasks assessed                        Extremity/Trunk Assessment               Exercises     Shoulder Instructions       General Comments      Pertinent Vitals/ Pain       Pain Score: 6  Pain Location: l knee Pain Descriptors / Indicators: Aching;Sore Pain Intervention(s): Limited activity within patient's tolerance  Home Living                                          Prior Functioning/Environment              Frequency Min 2X/week     Progress Toward Goals  OT Goals(current goals can now be found in the care plan section)        Plan Discharge plan remains appropriate    Co-evaluation                 End of Session     Activity Tolerance Patient limited by pain   Patient Left in chair;with call bell/phone within reach  Nurse Communication Mobility status        Time: 7681-1572 OT Time Calculation (min): 28 min  Charges: OT General Charges $OT Visit: 1 Procedure OT Treatments $Self Care/Home Management : 23-37 mins  Ritu Gagliardo, Thereasa Parkin 02/26/2014, 10:27 AM

## 2014-02-26 NOTE — Progress Notes (Signed)
     Subjective: 4 Days Post-Op Procedure(s) (LRB): LEFT TOTAL KNEE ARTHROPLASTY (Left)   Patient reports pain as severe at times. He feels that the pain medications are help the pain in the knee, however the spasms are hurting him more than anything.  He feels that he has had spasms of the muscle since the the surgery, and feels that the robaxin isn't controlling them at all. Discussed changing the muscle relaxer to see if it would help him better.  Objective:   VITALS:   Filed Vitals:   02/26/14 0613  BP: 122/63  Pulse: 72  Temp: 98.3 F (36.8 C)  Resp: 16    Dorsiflexion/Plantar flexion intact Incision: dressing C/D/I No cellulitis present Compartment soft  LABS  Recent Labs  02/24/14 0445 02/25/14 0517  HGB 11.1* 10.2*  HCT 33.5* 31.2*  WBC 11.7* 10.5  PLT 190 229     Recent Labs  02/24/14 0445  NA 126*  K 3.4*  BUN 11  CREATININE 0.67  GLUCOSE 119*     Assessment/Plan: 4 Days Post-Op Procedure(s) (LRB): LEFT TOTAL KNEE ARTHROPLASTY (Left)  Changed from Robaxin to flexeril Up with therapy Discharge home with home health eventually, when ready     West Pugh. Dwight Burdo   PAC  02/26/2014, 10:19 AM

## 2014-02-26 NOTE — Progress Notes (Signed)
Physical Therapy Treatment Patient Details Name: Andrew Haley MRN: [id number] DOB: 07-Jan-1966 Today's Date: 02/26/2014    History of Present Illness [name]    PT Comments    Pt progressing, still may benefit from STSNF if agreeable, if not rec HHPT  Follow Up Recommendations  SNF (? pt states he's going home now?)     Equipment Recommendations  Rolling walker with 5" wheels    Recommendations for Other Services       Precautions / Restrictions Precautions Precautions: Knee;Fall Precaution Comments: pt continues with muscle spasms in UEs and LEs, improving though Required Braces or Orthoses: Knee Immobilizer - Left Knee Immobilizer - Left: Discontinue once straight leg raise with < 10 degree lag Restrictions Weight Bearing Restrictions: No Other Position/Activity Restrictions: WBAT    Mobility  Bed Mobility Overal bed mobility: Needs Assistance         Sit to supine: Min assist   General bed mobility comments: cues for sequence and use of R LE to self assist.  Physical assist to manage L LE  Transfers Overall transfer level: Needs assistance Equipment used: Rolling walker (2 wheeled) Transfers: Sit to/from Stand Sit to Stand: Min assist Stand pivot transfers: +2 physical assistance       General transfer comment: cues for LE management and use of UEs to self assist.  Ambulation/Gait Ambulation/Gait assistance: Min guard;Min assist;+2 safety/equipment Ambulation Distance (Feet): 70 Feet Assistive device: Rolling walker (2 wheeled) Gait Pattern/deviations: Step-to pattern;Trunk flexed;Decreased stance time - left     General Gait Details: cues for posture, position from RW and sequence   Stairs            Wheelchair Mobility    Modified Rankin (Stroke Patients Only)       Balance                                    Cognition Arousal/Alertness: Awake/alert Behavior During Therapy: WFL for tasks assessed/performed Overall  Cognitive Status: Within Functional Limits for tasks assessed                      Exercises      General Comments        Pertinent Vitals/Pain Pain Assessment: 0-10 Pain Score: 4  Pain Location: L knee Pain Descriptors / Indicators: Spasm Pain Intervention(s): Limited activity within patient's tolerance;Monitored during session;Premedicated before session;Ice applied    Home Living                      Prior Function            PT Goals (current goals can now be found in the care plan section) Acute Rehab PT Goals Patient Stated Goal: Resume previous lifestyle with decreased pain PT Goal Formulation: With patient Time For Goal Achievement: 03/02/14 Potential to Achieve Goals: Good Progress towards PT goals: Progressing toward goals    Frequency  7X/week    PT Plan Current plan remains appropriate    Co-evaluation             End of Session Equipment Utilized During Treatment: Right knee immobilizer Activity Tolerance: Patient tolerated treatment well Patient left: in chair;with call bell/phone within reach     Time: 1035-1055 PT Time Calculation (min) (ACUTE ONLY): 20 min  Charges:  $Gait Training: 8-22 mins  G CodesKenyon Haley 02/26/2014, 11:47 AM

## 2014-02-26 NOTE — Progress Notes (Addendum)
Physical Therapy Treatment Patient Details Name: Andrew Haley MRN: [id number] DOB: 07/27/1965 Today's Date: 03-20-2014    History of Present Illness [name]    PT Comments    Progressing slowly, needs SNF  Follow Up Recommendations  SNF     Equipment Recommendations  Rolling walker with 5" wheels    Recommendations for Other Services OT consult     Precautions / Restrictions Precautions Precautions: Knee;Fall Precaution Comments: pt continues with muscle spasms in UEs and LEs, improving though Required Braces or Orthoses: Knee Immobilizer - Left Knee Immobilizer - Left: Discontinue once straight leg raise with < 10 degree lag Restrictions Weight Bearing Restrictions: No Other Position/Activity Restrictions: WBAT    Mobility  Bed Mobility Overal bed mobility: Needs Assistance Bed Mobility: Supine to Sit;Sit to Supine     Supine to sit: Min assist Sit to supine: Min assist   General bed mobility comments: cues for sequence and use of R LE to self assist.  Physical assist to manage L LE         Stairs            Wheelchair Mobility    Modified Rankin (Stroke Patients Only)       Balance                                    Cognition Arousal/Alertness: Awake/alert Behavior During Therapy: WFL for tasks assessed/performed Overall Cognitive Status: Within Functional Limits for tasks assessed                      Exercises Total Joint Exercises Ankle Circles/Pumps: AROM;Both;15 reps;Supine Quad Sets: AROM;Both;10 reps;Supine Heel Slides: AAROM;Left;Supine;5 reps Hip ABduction/ADduction: AAROM;Left;10 reps Straight Leg Raises: AAROM;10 reps;Left;Supine Knee Flexion: AAROM;Left;5 reps;Seated    General Comments        Pertinent Vitals/Pain Pain Assessment: 0-10 Pain Score: 4  Pain Location: L knee Pain Descriptors / Indicators: Contraction;Cramping;Spasm Pain Intervention(s): Limited activity within patient's  tolerance;Monitored during session;Ice applied    Home Living                      Prior Function            PT Goals (current goals can now be found in the care plan section) Acute Rehab PT Goals Patient Stated Goal: Resume previous lifestyle with decreased pain PT Goal Formulation: With patient Time For Goal Achievement: 03/02/14 Potential to Achieve Goals: Good Progress towards PT goals: Progressing toward goals    Frequency  7X/week    PT Plan Current plan remains appropriate    Co-evaluation             End of Session Equipment Utilized During Treatment: Right knee immobilizer Activity Tolerance: Patient limited by pain Patient left: in bed;with call bell/phone within reach;with family/visitor present     Time: 5462-7035 PT Time Calculation (min) (ACUTE ONLY): 33 min  Charges:   $Therapeutic Exercise: 23-37 mins                    G Codes:      Andrew Haley 20-Mar-2014, 2:46 PM

## 2014-02-27 MED ORDER — CYCLOBENZAPRINE HCL 5 MG PO TABS
5.0000 mg | ORAL_TABLET | Freq: Three times a day (TID) | ORAL | Status: DC | PRN
Start: 2014-02-27 — End: 2014-05-25

## 2014-02-27 MED ORDER — DSS 100 MG PO CAPS: 100.0000 mg | ORAL_CAPSULE | Freq: Two times a day (BID) | ORAL | Status: DC

## 2014-02-27 NOTE — Progress Notes (Signed)
Physical Therapy Treatment Patient Details Name: Andrew Haley MRN: [id number] DOB: 1965/04/25 Today's Date: 02/27/2014    History of Present Illness [name]    PT Comments    Pt making major progress from last session and now candidate for return home rather than needing SNF level rehab.  Reviewed don/doff KI with pt.  Pt states he can use ramp to get into house and recommended same as pt stairs do not have railings.  Still waiting on equipment.  Follow Up Recommendations  SNF     Equipment Recommendations  Rolling walker with 5" wheels (wide)    Recommendations for Other Services OT consult     Precautions / Restrictions Precautions Precautions: Fall;Knee Required Braces or Orthoses: Knee Immobilizer - Left Knee Immobilizer - Left: Discontinue once straight leg raise with < 10 degree lag Restrictions Weight Bearing Restrictions: No Other Position/Activity Restrictions: WBAT    Mobility  Bed Mobility Overal bed mobility: Needs Assistance Bed Mobility: Supine to Sit     Supine to sit: Min guard Sit to supine: Supervision   General bed mobility comments: cues for sequence and use of R LE to self assist.  Physical assist to manage L LE  Transfers Overall transfer level: Needs assistance Equipment used: Rolling walker (2 wheeled) Transfers: Sit to/from Stand Sit to Stand: Min guard Stand pivot transfers: Min guard       General transfer comment: cues for LE management and use of UEs to self assist.  Ambulation/Gait Ambulation/Gait assistance: Min guard;Supervision Ambulation Distance (Feet): 160 Feet Assistive device: Rolling walker (2 wheeled) Gait Pattern/deviations: Step-to pattern;Shuffle;Antalgic;Trunk flexed;Wide base of support Gait velocity: Pt demonstrating much better control and saftey awareness   General Gait Details: min cues for postrue, pacing and position from RW   Stairs Stairs:  (Pt states he has 2 steps without rails or can use ramp.)          Wheelchair Mobility    Modified Rankin (Stroke Patients Only)       Balance                                    Cognition Arousal/Alertness: Awake/alert Behavior During Therapy: WFL for tasks assessed/performed Overall Cognitive Status: Within Functional Limits for tasks assessed                      Exercises Total Joint Exercises Ankle Circles/Pumps: AROM;Both;15 reps;Supine Quad Sets: AROM;Both;Supine;15 reps Heel Slides: AAROM;Left;Supine;15 reps Straight Leg Raises: AAROM;Left;Supine;20 reps Goniometric ROM: AAROM at L knee -15 - 45    General Comments        Pertinent Vitals/Pain Pain Assessment: 0-10 Pain Score: 6  Pain Location: l knee Pain Descriptors / Indicators: Sore Pain Intervention(s): Monitored during session;Repositioned;Ice applied    Home Living                      Prior Function            PT Goals (current goals can now be found in the care plan section) Acute Rehab PT Goals Patient Stated Goal: Resume previous lifestyle with decreased pain PT Goal Formulation: With patient Time For Goal Achievement: 03/02/14 Potential to Achieve Goals: Good Progress towards PT goals: Progressing toward goals    Frequency  7X/week    PT Plan Current plan remains appropriate    Co-evaluation  End of Session Equipment Utilized During Treatment: Right knee immobilizer Activity Tolerance: Patient tolerated treatment well Patient left: in bed;with call bell/phone within reach;with family/visitor present     Time: 0923-1001 PT Time Calculation (min) (ACUTE ONLY): 38 min  Charges:  $Gait Training: 8-22 mins $Therapeutic Exercise: 8-22 mins $Therapeutic Activity: 8-22 mins                    G Codes:      Teneisha Gignac Mar 07, 2014, 12:13 PM

## 2014-02-27 NOTE — Progress Notes (Signed)
   Subjective: 5 Days Post-Op Procedure(s) (LRB): LEFT TOTAL KNEE ARTHROPLASTY (Left) Patient reports pain as moderate.   Patient seen in rounds without Dr. Gladstone Lighter.  Patient is well, and has had no acute complaints or problems. Patient sleeping comfortably in room upon entry into room for rounds. Reports that he is feeling much better today. No issues overnight. No SOB or chest pain.  Plan is to go Home after hospital stay.  Objective: Vital signs in last 24 hours: Temp:  [97.3 F (36.3 C)-99.5 F (37.5 C)] 97.9 F (36.6 C) (01/18 0605) Pulse Rate:  [75-98] 81 (01/18 0605) Resp:  [16-18] 18 (01/18 0605) BP: (135-148)/(81-98) 148/81 mmHg (01/18 0605) SpO2:  [100 %] 100 % (01/18 0605)  Intake/Output from previous day:  Intake/Output Summary (Last 24 hours) at 02/27/14 0857 Last data filed at 02/27/14 1007  Gross per 24 hour  Intake    720 ml  Output   1950 ml  Net  -1230 ml    Intake/Output this shift: Total I/O In: -  Out: 300 [Urine:300]  Labs:  Recent Labs  02/25/14 0517  HGB 10.2*    Recent Labs  02/25/14 0517  WBC 10.5  RBC 3.78*  HCT 31.2*  PLT 229    EXAM General - Patient is Alert and Oriented Extremity - Neurologically intact Intact pulses distally Dorsiflexion/Plantar flexion intact No cellulitis present Compartment soft Dressing/Incision - clean, dry, no drainage Motor Function - intact, moving foot and toes well on exam.   Past Medical History  Diagnosis Date  . Hypertension   . Hypertriglyceridemia   . Numbness of fingers of both hands   . Arthritis   . History of gout     Assessment/Plan: 5 Days Post-Op Procedure(s) (LRB): LEFT TOTAL KNEE ARTHROPLASTY (Left) Active Problems:   H/O total knee replacement  Estimated body mass index is 41.96 kg/(m^2) as calculated from the following:   Height as of this encounter: 6\' 1"  (1.854 m).   Weight as of this encounter: 144.244 kg (318 lb). Advance diet Up with therapy Discharge home  with home health  DVT Prophylaxis - Xarelto Weight-Bearing as tolerated   Patient is doing better. Seems to have responded well to change from Robaxin to Flexeril. PT this morning and then DC home.   Ardeen Jourdain, PA-C Orthopaedic Surgery 02/27/2014, 8:57 AM

## 2014-02-27 NOTE — Progress Notes (Signed)
Pt to d/c [facility] with [facility]. A RW is set to be delivered to patient's home. Pt states he has a walker that he can get in the house with and ensures staff it is adequate until he gets his new one delivered. AVS reviewed and "My Chart" discussed with pt. Pt capable of verbalizing medications, signs and symptoms of infection, and follow-up appointments. Remains hemodynamically stable. No signs and symptoms of distress. Educated pt to return to ER in the case of SOB, dizziness, or chest pain.

## 2014-02-27 NOTE — Progress Notes (Signed)
Occupational Therapy Treatment Patient Details Name: FARAAZ WOLIN MRN: [id number] DOB: Jan 13, 1966 Today's Date: 02/27/2014    History of present illness [name]   OT comments  DC plan is now home  Follow Up Recommendations  Home health OT    Equipment Recommendations  None recommended by OT    Recommendations for Other Services      Precautions / Restrictions Precautions Precautions: Fall;Knee Required Braces or Orthoses: Knee Immobilizer - Left Restrictions Weight Bearing Restrictions: No       Mobility Bed Mobility Overal bed mobility: Needs Assistance Bed Mobility: Supine to Sit     Supine to sit: Min guard     General bed mobility comments: cues for sequence and use of R LE to self assist.  Physical assist to manage L LE  Transfers Overall transfer level: Needs assistance Equipment used: Rolling walker (2 wheeled) Transfers: Sit to/from Stand Sit to Stand: Min guard Stand pivot transfers: Min guard       General transfer comment: cues for LE management and use of UEs to self assist.    Balance                                   ADL       Grooming: Set up;Standing               Lower Body Dressing: Moderate assistance;Sit to/from stand   Toilet Transfer: Minimal assistance;RW   Toileting- Clothing Manipulation and Hygiene: Sit to/from stand;Minimal assistance     Tub/Shower Transfer Details (indicate cue type and reason): HH will address Functional mobility during ADLs: Minimal assistance General ADL Comments: pt has made gains and has decided he can go home with A      Vision                     Perception     Praxis      Cognition   Behavior During Therapy: WFL for tasks assessed/performed Overall Cognitive Status: Within Functional Limits for tasks assessed                       Extremity/Trunk Assessment               Exercises     Shoulder Instructions       General Comments       Pertinent Vitals/ Pain       Pain Score: 6  Pain Location: l knee Pain Descriptors / Indicators: Sore Pain Intervention(s): Monitored during session;Repositioned;Ice applied  Home Living                                          Prior Functioning/Environment              Frequency Min 2X/week     Progress Toward Goals  OT Goals(current goals can now be found in the care plan section)  Progress towards OT goals: Progressing toward goals     Plan Discharge plan needs to be updated    Co-evaluation                 End of Session Equipment Utilized During Treatment: Rolling walker   Activity Tolerance Patient tolerated treatment well   Patient Left in chair;with call bell/phone within reach  Nurse Communication Mobility status        Time: 1027-1050 OT Time Calculation (min): 23 min  Charges: OT General Charges $OT Visit: 1 Procedure OT Treatments $Self Care/Home Management : 23-37 mins  Malaysha Arlen, Thereasa Parkin 02/27/2014, 10:55 AM

## 2014-02-28 DIAGNOSIS — Z96652 Presence of left artificial knee joint: Secondary | ICD-10-CM | POA: Diagnosis not present

## 2014-02-28 DIAGNOSIS — Z471 Aftercare following joint replacement surgery: Secondary | ICD-10-CM | POA: Diagnosis not present

## 2014-02-28 DIAGNOSIS — I10 Essential (primary) hypertension: Secondary | ICD-10-CM | POA: Diagnosis not present

## 2014-02-28 DIAGNOSIS — M1711 Unilateral primary osteoarthritis, right knee: Secondary | ICD-10-CM | POA: Diagnosis not present

## 2014-03-01 DIAGNOSIS — Z471 Aftercare following joint replacement surgery: Secondary | ICD-10-CM | POA: Diagnosis not present

## 2014-03-01 DIAGNOSIS — I10 Essential (primary) hypertension: Secondary | ICD-10-CM | POA: Diagnosis not present

## 2014-03-01 DIAGNOSIS — Z96652 Presence of left artificial knee joint: Secondary | ICD-10-CM | POA: Diagnosis not present

## 2014-03-01 DIAGNOSIS — M1711 Unilateral primary osteoarthritis, right knee: Secondary | ICD-10-CM | POA: Diagnosis not present

## 2014-03-01 NOTE — Discharge Summary (Signed)
Physician Discharge Summary   Patient ID: Andrew Haley MRN: [id number] DOB/AGE: 1965/04/02 49 y.o.  Admit date: 02/22/2014 Discharge date: 02/27/2014  Primary Diagnosis: Osteoarthritis, left knee   Admission Diagnoses:  Past Medical History  Diagnosis Date  . Hypertension   . Hypertriglyceridemia   . Numbness of fingers of both hands   . Arthritis   . History of gout    Discharge Diagnoses:   Active Problems:   H/O total knee replacement  Estimated body mass index is 41.96 kg/(m^2) as calculated from the following:   Height as of this encounter: $RemoveBeforeD'6\' 1"'[id number]$  (1.854 m).   Weight as of this encounter: 144.244 kg (318 lb).  Procedure:  Procedure(s) (LRB): LEFT TOTAL KNEE ARTHROPLASTY (Left)   Consults: None  HPI: Andrew Haley, 49 y.o. male, has a history of pain and functional disability in the left knee due to arthritis and has failed non-surgical conservative treatments for greater than 12 weeks to includeNSAID's and/or analgesics, corticosteriod injections and activity modification. Onset of symptoms was gradual, starting >10 years ago with gradually worsening course since that time. The patient noted prior procedures on the knee to include arthroscopy and menisectomy on the left knee(s). Patient currently rates pain in the left knee(s) at 8 out of 10 with activity. Patient has night pain, worsening of pain with activity and weight bearing, pain that interferes with activities of daily living, pain with passive range of motion, crepitus and joint swelling. Patient has evidence of periarticular osteophytes and joint space narrowing by imaging studies. There is no active infection.  Laboratory Data: Admission on 02/22/2014, Discharged on 02/27/2014  Component Date Value Ref Range Status  . WBC 02/23/2014 9.4  4.0 - 10.5 K/uL Final  . RBC 02/23/2014 4.17* 4.22 - 5.81 MIL/uL Final  . Hemoglobin 02/23/2014 11.4* 13.0 - 17.0 g/dL Final  . HCT 02/23/2014 35.0* 39.0 - 52.0 % Final  .  MCV 02/23/2014 83.9  78.0 - 100.0 fL Final  . MCH 02/23/2014 27.3  26.0 - 34.0 pg Final  . MCHC 02/23/2014 32.6  30.0 - 36.0 g/dL Final  . RDW 02/23/2014 12.6  11.5 - 15.5 % Final  . Platelets 02/23/2014 202  150 - 400 K/uL Final  . Sodium 02/23/2014 134* 135 - 145 mmol/L Final   Please note change in reference range.  . Potassium 02/23/2014 3.7  3.5 - 5.1 mmol/L Final   Please note change in reference range.  . Chloride 02/23/2014 97  96 - 112 mEq/L Final  . CO2 02/23/2014 29  19 - 32 mmol/L Final  . Glucose, Bld 02/23/2014 138* 70 - 99 mg/dL Final  . BUN 02/23/2014 10  6 - 23 mg/dL Final  . Creatinine, Ser 02/23/2014 0.77  0.50 - 1.35 mg/dL Final  . Calcium 02/23/2014 8.4  8.4 - 10.5 mg/dL Final  . GFR calc non Af Amer 02/23/2014 >90  >90 mL/min Final  . GFR calc Af Amer 02/23/2014 >90  >90 mL/min Final   Comment: (NOTE) The eGFR has been calculated using the CKD EPI equation. This calculation has not been validated in all clinical situations. eGFR's persistently <90 mL/min signify possible Chronic Kidney Disease.   . Anion gap 02/23/2014 8  5 - 15 Final  . WBC 02/24/2014 11.7* 4.0 - 10.5 K/uL Final  . RBC 02/24/2014 4.09* 4.22 - 5.81 MIL/uL Final  . Hemoglobin 02/24/2014 11.1* 13.0 - 17.0 g/dL Final  . HCT 02/24/2014 33.5* 39.0 - 52.0 % Final  . MCV 02/24/2014  81.9  78.0 - 100.0 fL Final  . MCH 02/24/2014 27.1  26.0 - 34.0 pg Final  . MCHC 02/24/2014 33.1  30.0 - 36.0 g/dL Final  . RDW 02/24/2014 12.5  11.5 - 15.5 % Final  . Platelets 02/24/2014 190  150 - 400 K/uL Final  . Sodium 02/24/2014 126* 135 - 145 mmol/L Final   Comment: Please note change in reference range. DELTA CHECK NOTED   . Potassium 02/24/2014 3.4* 3.5 - 5.1 mmol/L Final   Please note change in reference range.  . Chloride 02/24/2014 88* 96 - 112 mEq/L Final   DELTA CHECK NOTED  . CO2 02/24/2014 29  19 - 32 mmol/L Final  . Glucose, Bld 02/24/2014 119* 70 - 99 mg/dL Final  . BUN 02/24/2014 11  6 - 23  mg/dL Final  . Creatinine, Ser 02/24/2014 0.67  0.50 - 1.35 mg/dL Final  . Calcium 02/24/2014 8.3* 8.4 - 10.5 mg/dL Final  . GFR calc non Af Amer 02/24/2014 >90  >90 mL/min Final  . GFR calc Af Amer 02/24/2014 >90  >90 mL/min Final   Comment: (NOTE) The eGFR has been calculated using the CKD EPI equation. This calculation has not been validated in all clinical situations. eGFR's persistently <90 mL/min signify possible Chronic Kidney Disease.   . Anion gap 02/24/2014 9  5 - 15 Final  . WBC 02/25/2014 10.5  4.0 - 10.5 K/uL Final  . RBC 02/25/2014 3.78* 4.22 - 5.81 MIL/uL Final  . Hemoglobin 02/25/2014 10.2* 13.0 - 17.0 g/dL Final  . HCT 02/25/2014 31.2* 39.0 - 52.0 % Final  . MCV 02/25/2014 82.5  78.0 - 100.0 fL Final  . MCH 02/25/2014 27.0  26.0 - 34.0 pg Final  . MCHC 02/25/2014 32.7  30.0 - 36.0 g/dL Final  . RDW 02/25/2014 12.9  11.5 - 15.5 % Final  . Platelets 02/25/2014 229  150 - 400 K/uL Final  Hospital Outpatient Visit on 02/16/2014  Component Date Value Ref Range Status  . MRSA, PCR 02/16/2014 NEGATIVE  NEGATIVE Final  . Staphylococcus aureus 02/16/2014 POSITIVE* NEGATIVE Final   Comment:        The Xpert SA Assay (FDA approved for NASAL specimens in patients over 72 years of age), is one component of a comprehensive surveillance program.  Test performance has been validated by [employer] for patients greater than or equal to 86 year old. It is not intended to diagnose infection nor to guide or monitor treatment.   . Color, Urine 02/16/2014 YELLOW  YELLOW Final  . APPearance 02/16/2014 CLEAR  CLEAR Final  . Specific Gravity, Urine 02/16/2014 1.008  1.005 - 1.030 Final  . pH 02/16/2014 5.5  5.0 - 8.0 Final  . Glucose, UA 02/16/2014 NEGATIVE  NEGATIVE mg/dL Final  . Hgb urine dipstick 02/16/2014 NEGATIVE  NEGATIVE Final  . Bilirubin Urine 02/16/2014 NEGATIVE  NEGATIVE Final  . Ketones, ur 02/16/2014 NEGATIVE  NEGATIVE mg/dL Final  . Protein, ur 02/16/2014  NEGATIVE  NEGATIVE mg/dL Final  . Urobilinogen, UA 02/16/2014 0.2  0.0 - 1.0 mg/dL Final  . Nitrite 02/16/2014 NEGATIVE  NEGATIVE Final  . Leukocytes, UA 02/16/2014 NEGATIVE  NEGATIVE Final   MICROSCOPIC NOT DONE ON URINES WITH NEGATIVE PROTEIN, BLOOD, LEUKOCYTES, NITRITE, OR GLUCOSE <1000 mg/dL.  . ABO/RH(D) 02/16/2014 A POS   Final  . Antibody Screen 02/16/2014 NEG   Final  . Sample Expiration 02/16/2014 02/25/2014   Final  . Prothrombin Time 02/16/2014 12.3  11.6 - 15.2 seconds Final  .  INR 02/16/2014 0.90  0.00 - 1.49 Final  . aPTT 02/16/2014 27  24 - 37 seconds Final  . ABO/RH(D) 02/16/2014 A POS   Final     X-Rays:Dg Chest 2 View  02/16/2014   CLINICAL DATA:  Preop for left total knee arthroplasty. Hypertension.  EXAM: CHEST  2 VIEW  COMPARISON:  April 06, 2008.  FINDINGS: The heart size and mediastinal contours are within normal limits. Both lungs are clear. The visualized skeletal structures are unremarkable.  IMPRESSION: No active cardiopulmonary disease.   Electronically Signed   By: Sabino Dick M.D.   On: 02/16/2014 14:33    EKG: Orders placed or performed during the hospital encounter of 02/16/14  . EKG 12-Lead  . EKG 12-Lead  . EKG  . EKG  . EKG 12-Lead  . EKG 12-Lead     Hospital Course: Andrew Haley is a 49 y.o. who was admitted to [facility]. They were brought to the operating room on 02/22/2014 and underwent Procedure(s): LEFT TOTAL KNEE ARTHROPLASTY.  Patient tolerated the procedure well and was later transferred to the recovery room and then to the orthopaedic floor for postoperative care.  They were given PO and IV analgesics for pain control following their surgery.  They were given 24 hours of postoperative antibiotics of  Anti-infectives    Start     Dose/Rate Route Frequency Ordered Stop   02/22/14 1830  ceFAZolin (ANCEF) IVPB 1 g/50 mL premix     1 g100 mL/hr over 30 Minutes Intravenous Every 6 hours 02/22/14 1721 02/23/14 0047   02/22/14 1221   polymyxin B 500,000 Units, bacitracin 50,000 Units in sodium chloride irrigation 0.9 % 500 mL irrigation  Status:  Discontinued       As needed 02/22/14 1221 02/22/14 1429   02/22/14 0600  ceFAZolin (ANCEF) 3 g in dextrose 5 % 50 mL IVPB     3 g160 mL/hr over 30 Minutes Intravenous On call to O.R. 02/21/14 1741 02/22/14 1150     and started on DVT prophylaxis in the form of Xarelto.  PT and OT were ordered for total joint protocol. Discharge planning consulted to help with postop disposition and equipment needs. Patient had a difficult night on the evening of surgery due to poor pain management. Worked very little with PT POD 1 due to pain. Hemovac drain was pulled without difficulty. Continued to work with therapy into day two. Patient continued to be limited due to pain and muscle spasms. Patient responded well to change from Robaxin to Flexeril. By day five, the patient had progressed with therapy and meeting their goals. Incision was healing well. Patient was seen in rounds and was ready to go home.   Diet: Cardiac diet Activity:WBAT Follow-up:in 2 weeks Disposition - Home Discharged Condition: stable   Discharge Instructions    Call MD / Call 911    Complete by:  As directed   If you experience chest pain or shortness of breath, CALL 911 and be transported to the hospital emergency room.  If you develope a fever above 101 F, pus (white drainage) or increased drainage or redness at the wound, or calf pain, call your surgeon's office.     Constipation Prevention    Complete by:  As directed   Drink plenty of fluids.  Prune juice may be helpful.  You may use a stool softener, such as Colace (over the counter) 100 mg twice a day.  Use MiraLax (over the counter) for constipation  as needed.     Diet - low sodium heart healthy    Complete by:  As directed      Discharge instructions    Complete by:  As directed   Walk with your walker. Weight bearing as tolerated Andrew Haley  will follow you at home for your therapy  Do not change your dressing unless it appears compromised Shower only, no tub bath. Call if any temperatures greater than 101 or any wound complications: [phone] during the day and ask for Dr. Charlestine Night nurse, Andrew Haley.     Do not put a pillow under the knee. Place it under the heel.    Complete by:  As directed      Driving restrictions    Complete by:  As directed   No driving     Increase activity slowly as tolerated    Complete by:  As directed             Medication List    STOP taking these medications        HYDROcodone-acetaminophen 10-325 MG per tablet  Commonly known as:  [place] these medications        cyclobenzaprine 5 MG tablet  Commonly known as:  FLEXERIL  Take 1-2 tablets (5-10 mg total) by mouth 3 (three) times daily as needed for muscle spasms.     diclofenac 75 MG EC tablet  Commonly known as:  VOLTAREN  Take 75 mg by mouth 2 (two) times daily.     DSS 100 MG Caps  Take 100 mg by mouth 2 (two) times daily.     HYDROmorphone 4 MG tablet  Commonly known as:  DILAUDID  Take 1 tablet (4 mg total) by mouth every 4 (four) hours as needed for severe pain.     lisinopril-hydrochlorothiazide 20-12.5 MG per tablet  Commonly known as:  PRINZIDE,ZESTORETIC  Take 1 tablet by mouth every morning.     rivaroxaban 10 MG Tabs tablet  Commonly known as:  XARELTO  Take 1 tablet (10 mg total) by mouth daily with breakfast.           Follow-up Information    Follow up with [facility].   Why:  rolling walker   Contact information:   [place]  [address] [phone]       Follow up with [facility].   Why:  home health physical therapy   Contact information:   [place] [address] [phone]       Follow up with GIOFFRE,RONALD A, MD. Schedule an appointment as soon as possible for a visit in 2 weeks.   Specialty:  Orthopedic  Surgery   Contact information:   [address] [place] [address] [phone]       Signed: Ardeen Jourdain, PA-C Orthopaedic Surgery 03/01/2014, 10:36 AM

## 2014-03-02 DIAGNOSIS — Z96652 Presence of left artificial knee joint: Secondary | ICD-10-CM | POA: Diagnosis not present

## 2014-03-02 DIAGNOSIS — Z471 Aftercare following joint replacement surgery: Secondary | ICD-10-CM | POA: Diagnosis not present

## 2014-03-02 DIAGNOSIS — M1711 Unilateral primary osteoarthritis, right knee: Secondary | ICD-10-CM | POA: Diagnosis not present

## 2014-03-02 DIAGNOSIS — I10 Essential (primary) hypertension: Secondary | ICD-10-CM | POA: Diagnosis not present

## 2014-03-03 DIAGNOSIS — Z96652 Presence of left artificial knee joint: Secondary | ICD-10-CM | POA: Diagnosis not present

## 2014-03-03 DIAGNOSIS — I10 Essential (primary) hypertension: Secondary | ICD-10-CM | POA: Diagnosis not present

## 2014-03-03 DIAGNOSIS — M1711 Unilateral primary osteoarthritis, right knee: Secondary | ICD-10-CM | POA: Diagnosis not present

## 2014-03-03 DIAGNOSIS — Z471 Aftercare following joint replacement surgery: Secondary | ICD-10-CM | POA: Diagnosis not present

## 2014-03-06 DIAGNOSIS — I10 Essential (primary) hypertension: Secondary | ICD-10-CM | POA: Diagnosis not present

## 2014-03-06 DIAGNOSIS — M1711 Unilateral primary osteoarthritis, right knee: Secondary | ICD-10-CM | POA: Diagnosis not present

## 2014-03-06 DIAGNOSIS — Z471 Aftercare following joint replacement surgery: Secondary | ICD-10-CM | POA: Diagnosis not present

## 2014-03-06 DIAGNOSIS — Z96652 Presence of left artificial knee joint: Secondary | ICD-10-CM | POA: Diagnosis not present

## 2014-03-07 DIAGNOSIS — Z471 Aftercare following joint replacement surgery: Secondary | ICD-10-CM | POA: Diagnosis not present

## 2014-03-07 DIAGNOSIS — M1711 Unilateral primary osteoarthritis, right knee: Secondary | ICD-10-CM | POA: Diagnosis not present

## 2014-03-07 DIAGNOSIS — I10 Essential (primary) hypertension: Secondary | ICD-10-CM | POA: Diagnosis not present

## 2014-03-07 DIAGNOSIS — Z96652 Presence of left artificial knee joint: Secondary | ICD-10-CM | POA: Diagnosis not present

## 2014-03-08 DIAGNOSIS — Z471 Aftercare following joint replacement surgery: Secondary | ICD-10-CM | POA: Diagnosis not present

## 2014-03-08 DIAGNOSIS — I10 Essential (primary) hypertension: Secondary | ICD-10-CM | POA: Diagnosis not present

## 2014-03-08 DIAGNOSIS — M1711 Unilateral primary osteoarthritis, right knee: Secondary | ICD-10-CM | POA: Diagnosis not present

## 2014-03-08 DIAGNOSIS — Z96652 Presence of left artificial knee joint: Secondary | ICD-10-CM | POA: Diagnosis not present

## 2014-03-09 DIAGNOSIS — Z471 Aftercare following joint replacement surgery: Secondary | ICD-10-CM | POA: Diagnosis not present

## 2014-03-09 DIAGNOSIS — Z96652 Presence of left artificial knee joint: Secondary | ICD-10-CM | POA: Diagnosis not present

## 2014-03-10 DIAGNOSIS — Z96652 Presence of left artificial knee joint: Secondary | ICD-10-CM | POA: Diagnosis not present

## 2014-03-10 DIAGNOSIS — Z471 Aftercare following joint replacement surgery: Secondary | ICD-10-CM | POA: Diagnosis not present

## 2014-03-10 DIAGNOSIS — I10 Essential (primary) hypertension: Secondary | ICD-10-CM | POA: Diagnosis not present

## 2014-03-10 DIAGNOSIS — M1711 Unilateral primary osteoarthritis, right knee: Secondary | ICD-10-CM | POA: Diagnosis not present

## 2014-03-13 DIAGNOSIS — M1711 Unilateral primary osteoarthritis, right knee: Secondary | ICD-10-CM | POA: Diagnosis not present

## 2014-03-13 DIAGNOSIS — Z471 Aftercare following joint replacement surgery: Secondary | ICD-10-CM | POA: Diagnosis not present

## 2014-03-13 DIAGNOSIS — I10 Essential (primary) hypertension: Secondary | ICD-10-CM | POA: Diagnosis not present

## 2014-03-13 DIAGNOSIS — Z96652 Presence of left artificial knee joint: Secondary | ICD-10-CM | POA: Diagnosis not present

## 2014-03-15 DIAGNOSIS — M1711 Unilateral primary osteoarthritis, right knee: Secondary | ICD-10-CM | POA: Diagnosis not present

## 2014-03-15 DIAGNOSIS — Z96652 Presence of left artificial knee joint: Secondary | ICD-10-CM | POA: Diagnosis not present

## 2014-03-15 DIAGNOSIS — Z471 Aftercare following joint replacement surgery: Secondary | ICD-10-CM | POA: Diagnosis not present

## 2014-03-15 DIAGNOSIS — I10 Essential (primary) hypertension: Secondary | ICD-10-CM | POA: Diagnosis not present

## 2014-03-16 DIAGNOSIS — M5412 Radiculopathy, cervical region: Secondary | ICD-10-CM | POA: Diagnosis not present

## 2014-03-16 DIAGNOSIS — M546 Pain in thoracic spine: Secondary | ICD-10-CM | POA: Diagnosis not present

## 2014-03-16 DIAGNOSIS — M9901 Segmental and somatic dysfunction of cervical region: Secondary | ICD-10-CM | POA: Diagnosis not present

## 2014-03-16 DIAGNOSIS — M9902 Segmental and somatic dysfunction of thoracic region: Secondary | ICD-10-CM | POA: Diagnosis not present

## 2014-03-17 DIAGNOSIS — M1711 Unilateral primary osteoarthritis, right knee: Secondary | ICD-10-CM | POA: Diagnosis not present

## 2014-03-17 DIAGNOSIS — Z471 Aftercare following joint replacement surgery: Secondary | ICD-10-CM | POA: Diagnosis not present

## 2014-03-17 DIAGNOSIS — Z96652 Presence of left artificial knee joint: Secondary | ICD-10-CM | POA: Diagnosis not present

## 2014-03-17 DIAGNOSIS — I10 Essential (primary) hypertension: Secondary | ICD-10-CM | POA: Diagnosis not present

## 2014-03-20 DIAGNOSIS — M5412 Radiculopathy, cervical region: Secondary | ICD-10-CM | POA: Diagnosis not present

## 2014-03-20 DIAGNOSIS — M9902 Segmental and somatic dysfunction of thoracic region: Secondary | ICD-10-CM | POA: Diagnosis not present

## 2014-03-20 DIAGNOSIS — M1711 Unilateral primary osteoarthritis, right knee: Secondary | ICD-10-CM | POA: Diagnosis not present

## 2014-03-20 DIAGNOSIS — M9901 Segmental and somatic dysfunction of cervical region: Secondary | ICD-10-CM | POA: Diagnosis not present

## 2014-03-20 DIAGNOSIS — Z96652 Presence of left artificial knee joint: Secondary | ICD-10-CM | POA: Diagnosis not present

## 2014-03-20 DIAGNOSIS — M546 Pain in thoracic spine: Secondary | ICD-10-CM | POA: Diagnosis not present

## 2014-03-20 DIAGNOSIS — Z471 Aftercare following joint replacement surgery: Secondary | ICD-10-CM | POA: Diagnosis not present

## 2014-03-20 DIAGNOSIS — I10 Essential (primary) hypertension: Secondary | ICD-10-CM | POA: Diagnosis not present

## 2014-03-21 DIAGNOSIS — M25561 Pain in right knee: Secondary | ICD-10-CM | POA: Diagnosis not present

## 2014-03-22 DIAGNOSIS — Z471 Aftercare following joint replacement surgery: Secondary | ICD-10-CM | POA: Diagnosis not present

## 2014-03-22 DIAGNOSIS — Z96652 Presence of left artificial knee joint: Secondary | ICD-10-CM | POA: Diagnosis not present

## 2014-03-24 DIAGNOSIS — Z471 Aftercare following joint replacement surgery: Secondary | ICD-10-CM | POA: Diagnosis not present

## 2014-03-24 DIAGNOSIS — Z96652 Presence of left artificial knee joint: Secondary | ICD-10-CM | POA: Diagnosis not present

## 2014-03-28 DIAGNOSIS — Z96652 Presence of left artificial knee joint: Secondary | ICD-10-CM | POA: Diagnosis not present

## 2014-03-28 DIAGNOSIS — Z471 Aftercare following joint replacement surgery: Secondary | ICD-10-CM | POA: Diagnosis not present

## 2014-03-29 DIAGNOSIS — Z471 Aftercare following joint replacement surgery: Secondary | ICD-10-CM | POA: Diagnosis not present

## 2014-03-29 DIAGNOSIS — Z96652 Presence of left artificial knee joint: Secondary | ICD-10-CM | POA: Diagnosis not present

## 2014-03-31 DIAGNOSIS — Z471 Aftercare following joint replacement surgery: Secondary | ICD-10-CM | POA: Diagnosis not present

## 2014-03-31 DIAGNOSIS — Z96652 Presence of left artificial knee joint: Secondary | ICD-10-CM | POA: Diagnosis not present

## 2014-04-03 DIAGNOSIS — Z96652 Presence of left artificial knee joint: Secondary | ICD-10-CM | POA: Diagnosis not present

## 2014-04-03 DIAGNOSIS — Z471 Aftercare following joint replacement surgery: Secondary | ICD-10-CM | POA: Diagnosis not present

## 2014-04-05 DIAGNOSIS — Z96652 Presence of left artificial knee joint: Secondary | ICD-10-CM | POA: Diagnosis not present

## 2014-04-05 DIAGNOSIS — Z471 Aftercare following joint replacement surgery: Secondary | ICD-10-CM | POA: Diagnosis not present

## 2014-04-05 DIAGNOSIS — G894 Chronic pain syndrome: Secondary | ICD-10-CM | POA: Diagnosis not present

## 2014-04-05 DIAGNOSIS — Z6837 Body mass index (BMI) 37.0-37.9, adult: Secondary | ICD-10-CM | POA: Diagnosis not present

## 2014-04-07 DIAGNOSIS — Z96652 Presence of left artificial knee joint: Secondary | ICD-10-CM | POA: Diagnosis not present

## 2014-04-07 DIAGNOSIS — Z471 Aftercare following joint replacement surgery: Secondary | ICD-10-CM | POA: Diagnosis not present

## 2014-04-10 DIAGNOSIS — Z471 Aftercare following joint replacement surgery: Secondary | ICD-10-CM | POA: Diagnosis not present

## 2014-04-10 DIAGNOSIS — Z96652 Presence of left artificial knee joint: Secondary | ICD-10-CM | POA: Diagnosis not present

## 2014-04-12 DIAGNOSIS — Z471 Aftercare following joint replacement surgery: Secondary | ICD-10-CM | POA: Diagnosis not present

## 2014-04-12 DIAGNOSIS — Z96652 Presence of left artificial knee joint: Secondary | ICD-10-CM | POA: Diagnosis not present

## 2014-04-14 DIAGNOSIS — Z471 Aftercare following joint replacement surgery: Secondary | ICD-10-CM | POA: Diagnosis not present

## 2014-04-14 DIAGNOSIS — Z96652 Presence of left artificial knee joint: Secondary | ICD-10-CM | POA: Diagnosis not present

## 2014-04-19 DIAGNOSIS — Z96652 Presence of left artificial knee joint: Secondary | ICD-10-CM | POA: Diagnosis not present

## 2014-04-19 DIAGNOSIS — Z471 Aftercare following joint replacement surgery: Secondary | ICD-10-CM | POA: Diagnosis not present

## 2014-04-20 DIAGNOSIS — M9901 Segmental and somatic dysfunction of cervical region: Secondary | ICD-10-CM | POA: Diagnosis not present

## 2014-04-20 DIAGNOSIS — M9902 Segmental and somatic dysfunction of thoracic region: Secondary | ICD-10-CM | POA: Diagnosis not present

## 2014-04-20 DIAGNOSIS — M5412 Radiculopathy, cervical region: Secondary | ICD-10-CM | POA: Diagnosis not present

## 2014-04-20 DIAGNOSIS — M546 Pain in thoracic spine: Secondary | ICD-10-CM | POA: Diagnosis not present

## 2014-04-21 DIAGNOSIS — Z96652 Presence of left artificial knee joint: Secondary | ICD-10-CM | POA: Diagnosis not present

## 2014-04-21 DIAGNOSIS — Z471 Aftercare following joint replacement surgery: Secondary | ICD-10-CM | POA: Diagnosis not present

## 2014-04-24 DIAGNOSIS — Z96652 Presence of left artificial knee joint: Secondary | ICD-10-CM | POA: Diagnosis not present

## 2014-04-24 DIAGNOSIS — Z471 Aftercare following joint replacement surgery: Secondary | ICD-10-CM | POA: Diagnosis not present

## 2014-04-26 ENCOUNTER — Other Ambulatory Visit: Payer: Self-pay | Admitting: Surgical

## 2014-04-26 DIAGNOSIS — Z96652 Presence of left artificial knee joint: Secondary | ICD-10-CM | POA: Diagnosis not present

## 2014-04-26 DIAGNOSIS — Z471 Aftercare following joint replacement surgery: Secondary | ICD-10-CM | POA: Diagnosis not present

## 2014-05-05 DIAGNOSIS — Z471 Aftercare following joint replacement surgery: Secondary | ICD-10-CM | POA: Diagnosis not present

## 2014-05-05 DIAGNOSIS — Z96652 Presence of left artificial knee joint: Secondary | ICD-10-CM | POA: Diagnosis not present

## 2014-05-15 NOTE — Patient Instructions (Addendum)
Andrew Haley  05/15/2014   Your procedure is scheduled on: 05/23/14   Report to [facility] Main  Entrance and follow signs to               Andrew Haley at 10:15 AM.   Call this number if you have problems the morning of surgery [phone]   Remember:  Do not eat food or drink liquids :After Midnight.    Take these medicines the morning of surgery with A SIP OF WATER: HYDROCODONE IF NEEDED                               You may not have any metal on your body including hair pins and              piercings  Do not wear jewelry, make-up, lotions, powders or perfumes.             Do not wear nail polish.  Do not shave  48 hours prior to surgery.              Men may shave face and neck.   Do not bring valuables to the hospital. Andrew Haley.  Contacts, dentures or bridgework may not be worn into surgery.  Leave suitcase in the car. After surgery it may be brought to your room.     Patients discharged the day of surgery will not be allowed to drive home.  Name and phone number of your driver:  Special Instructions: N/A              Please read over the following fact sheets you were given: _____________________________________________________________________                                                     Andrew Haley  Before surgery, you can play an important role.  Because skin is not sterile, your skin needs to be as free of germs as possible.  You can reduce the number of germs on your skin by washing with CHG (chlorahexidine gluconate) soap before surgery.  CHG is an antiseptic cleaner which kills germs and bonds with the skin to continue killing germs even after washing. Please DO NOT use if you have an allergy to CHG or antibacterial soaps.  If your skin becomes reddened/irritated stop using the CHG and inform your nurse when you arrive at Short Stay. Do not shave (including  legs and underarms) for at least 48 hours prior to the first CHG shower.  You may shave your face. Please follow these instructions carefully:   1.  Shower with CHG Soap the night before surgery and the  morning of Surgery.   2.  If you choose to wash your hair, wash your hair first as usual with your  normal  Shampoo.   3.  After you shampoo, rinse your hair and body thoroughly to remove the  shampoo.  4.  Use CHG as you would any other liquid soap.  You can apply chg directly  to the skin and wash . Gently wash with scrungie or clean wascloth    5.  Apply the CHG Soap to your body ONLY FROM THE NECK DOWN.   Do not use on open                           Wound or open sores. Avoid contact with eyes, ears mouth and genitals (private parts).                        Genitals (private parts) with your normal soap.              6.  Wash thoroughly, paying special attention to the area where your surgery  will be performed.   7.  Thoroughly rinse your body with warm water from the neck down.   8.  DO NOT shower/wash with your normal soap after using and rinsing off  the CHG Soap .                9.  Pat yourself dry with a clean towel.             10.  Wear clean night cloths to bed after shower             11.  Place clean sheets on your bed the night of your first shower and do not  sleep with pets.  Day of Surgery : Do not apply any lotions/deodorants the morning of surgery.  Please wear clean clothes to the hospital/surgery center.  FAILURE TO FOLLOW THESE INSTRUCTIONS MAY RESULT IN THE CANCELLATION OF YOUR SURGERY    PATIENT SIGNATURE_________________________________  ______________________________________________________________________     Andrew Haley  An incentive spirometer is a tool that can help keep your lungs clear and active. This tool measures how well you are filling your lungs with each breath. Taking long deep  breaths may help reverse or decrease the chance of developing breathing (pulmonary) problems (especially infection) following:  A long period of time when you are unable to move or be active. BEFORE THE PROCEDURE   If the spirometer includes an indicator to show your best effort, your nurse or respiratory therapist will set it to a desired goal.  If possible, sit up straight or lean slightly forward. Try not to slouch.  Hold the incentive spirometer in an upright position. INSTRUCTIONS FOR USE   Sit on the edge of your bed if possible, or sit up as far as you can in bed or on a chair.  Hold the incentive spirometer in an upright position.  Breathe out normally.  Place the mouthpiece in your mouth and seal your lips tightly around it.  Breathe in slowly and as deeply as possible, raising the piston or the ball toward the top of the column.  Hold your breath for 3-5 seconds or for as long as possible. Allow the piston or ball to fall to the bottom of the column.  Remove the mouthpiece from your mouth and breathe out normally.  Rest for a few seconds and repeat Steps 1 through 7 at least 10 times every 1-2 hours when you are awake. Take your time and take a few normal breaths between deep breaths.  The spirometer may include an indicator to show your best effort. Use the indicator as a  goal to work toward during each repetition.  After each set of 10 deep breaths, practice coughing to be sure your lungs are clear. If you have an incision (the cut made at the time of surgery), support your incision when coughing by placing a pillow or rolled up towels firmly against it. Once you are able to get out of bed, walk around indoors and cough well. You may stop using the incentive spirometer when instructed by your caregiver.  RISKS AND COMPLICATIONS  Take your time so you do not get dizzy or light-headed.  If you are in pain, you may need to take or ask for pain medication before doing  incentive spirometry. It is harder to take a deep breath if you are having pain. AFTER USE  Rest and breathe slowly and easily.  It can be helpful to keep track of a log of your progress. Your caregiver can provide you with a simple table to help with this. If you are using the spirometer at home, follow these instructions: Andrew Haley IF:   You are having difficultly using the spirometer.  You have trouble using the spirometer as often as instructed.  Your pain medication is not giving enough relief while using the spirometer.  You develop fever of 100.5 F (38.1 C) or higher. SEEK IMMEDIATE MEDICAL CARE IF:   You cough up bloody sputum that had not been present before.  You develop fever of 102 F (38.9 C) or greater.  You develop worsening pain at or near the incision site. MAKE SURE YOU:   Understand these instructions.  Will watch your condition.  Will get help right away if you are not doing well or get worse. Document Released: 06/09/2006 Document Revised: 04/21/2011 Document Reviewed: 08/10/2006 [employer] Patient Information 2014 [employer], [place].   ________________________________________________________________________  WHAT IS A BLOOD TRANSFUSION? Blood Transfusion Information  A transfusion is the replacement of blood or some of its parts. Blood is made up of multiple cells which provide different functions.  Red blood cells carry oxygen and are used for blood loss replacement.  White blood cells fight against infection.  Platelets control bleeding.  Plasma helps clot blood.  Other blood products are available for specialized needs, such as hemophilia or other clotting disorders. BEFORE THE TRANSFUSION  Who gives blood for transfusions?   Healthy volunteers who are fully evaluated to make sure their blood is safe. This is blood bank blood. Transfusion therapy is the safest it has ever been in the practice of medicine. Before blood is taken from a  donor, a complete history is taken to make sure that person has no history of diseases nor engages in risky social behavior (examples are intravenous drug use or sexual activity with multiple partners). The donor's travel history is screened to minimize risk of transmitting infections, such as malaria. The donated blood is tested for signs of infectious diseases, such as HIV and hepatitis. The blood is then tested to be sure it is compatible with you in order to minimize the chance of a transfusion reaction. If you or a relative donates blood, this is often done in anticipation of surgery and is not appropriate for emergency situations. It takes many days to process the donated blood. RISKS AND COMPLICATIONS Although transfusion therapy is very safe and saves many lives, the main dangers of transfusion include:   Getting an infectious disease.  Developing a transfusion reaction. This is an allergic reaction to something in the blood you were given. Every  precaution is taken to prevent this. The decision to have a blood transfusion has been considered carefully by your caregiver before blood is given. Blood is not given unless the benefits outweigh the risks. AFTER THE TRANSFUSION  Right after receiving a blood transfusion, you will usually feel much better and more energetic. This is especially true if your red blood cells have gotten low (anemic). The transfusion raises the level of the red blood cells which carry oxygen, and this usually causes an energy increase.  The nurse administering the transfusion will monitor you carefully for complications. HOME CARE INSTRUCTIONS  No special instructions are needed after a transfusion. You may find your energy is better. Speak with your caregiver about any limitations on activity for underlying diseases you may have. SEEK MEDICAL CARE IF:   Your condition is not improving after your transfusion.  You develop redness or irritation at the intravenous (IV)  site. SEEK IMMEDIATE MEDICAL CARE IF:  Any of the following symptoms occur over the next 12 hours:  Shaking chills.  You have a temperature by mouth above 102 F (38.9 C), not controlled by medicine.  Chest, back, or muscle pain.  People around you feel you are not acting correctly or are confused.  Shortness of breath or difficulty breathing.  Dizziness and fainting.  You get a rash or develop hives.  You have a decrease in urine output.  Your urine turns a dark color or changes to pink, red, or brown. Any of the following symptoms occur over the next 10 days:  You have a temperature by mouth above 102 F (38.9 C), not controlled by medicine.  Shortness of breath.  Weakness after normal activity.  The white part of the eye turns yellow (jaundice).  You have a decrease in the amount of urine or are urinating less often.  Your urine turns a dark color or changes to pink, red, or brown. Document Released: 01/25/2000 Document Revised: 04/21/2011 Document Reviewed: 09/13/2007 [facility] Patient Information 2014 [place], [place].  _______________________________________________________________________

## 2014-05-16 ENCOUNTER — Encounter (HOSPITAL_COMMUNITY): Payer: Self-pay

## 2014-05-16 ENCOUNTER — Encounter (HOSPITAL_COMMUNITY)
Admission: RE | Admit: 2014-05-16 | Discharge: 2014-05-16 | Disposition: A | Discharge status: Home / Self Care | Payer: Commercial Managed Care - HMO | Source: Ambulatory Visit | Attending: Orthopedic Surgery | Admitting: Orthopedic Surgery

## 2014-05-16 DIAGNOSIS — Z01812 Encounter for preprocedural laboratory examination: Secondary | ICD-10-CM | POA: Insufficient documentation

## 2014-05-16 HISTORY — DX: Other complications of anesthesia, initial encounter: T88.59XA

## 2014-05-16 HISTORY — DX: Adverse effect of unspecified anesthetic, initial encounter: T41.45XA

## 2014-05-16 LAB — COMPREHENSIVE METABOLIC PANEL
ALT: 18 U/L (ref 0–53)
AST: 20 U/L (ref 0–37)
Albumin: 3.8 g/dL (ref 3.5–5.2)
Alkaline Phosphatase: 63 U/L (ref 39–117)
Anion gap: 12 (ref 5–15)
BUN: 19 mg/dL (ref 6–23)
CO2: 25 mmol/L (ref 19–32)
Calcium: 8.5 mg/dL (ref 8.4–10.5)
Chloride: 103 mmol/L (ref 96–112)
Creatinine, Ser: 1.14 mg/dL (ref 0.50–1.35)
GFR calc Af Amer: 85 mL/min — ABNORMAL LOW (ref >90)
GFR calc non Af Amer: 73 mL/min — ABNORMAL LOW (ref >90)
Glucose, Bld: 107 mg/dL — ABNORMAL HIGH (ref 70–99)
Potassium: 3.8 mmol/L (ref 3.5–5.1)
Sodium: 140 mmol/L (ref 135–145)
Total Bilirubin: 0.3 mg/dL (ref 0.3–1.2)
Total Protein: 7 g/dL (ref 6.0–8.3)

## 2014-05-16 LAB — CBC WITH DIFFERENTIAL/PLATELET
Basophils Absolute: 0 10*3/uL (ref 0.0–0.1)
Basophils Relative: 0 % (ref 0–1)
Eosinophils Absolute: 0.4 10*3/uL (ref 0.0–0.7)
Eosinophils Relative: 7 % — ABNORMAL HIGH (ref 0–5)
HCT: 37.9 % — ABNORMAL LOW (ref 39.0–52.0)
Hemoglobin: 11.9 g/dL — ABNORMAL LOW (ref 13.0–17.0)
Lymphocytes Relative: 21 % (ref 12–46)
Lymphs Abs: 1.5 10*3/uL (ref 0.7–4.0)
MCH: 26.1 pg (ref 26.0–34.0)
MCHC: 31.4 g/dL (ref 30.0–36.0)
MCV: 83.1 fL (ref 78.0–100.0)
Monocytes Absolute: 0.4 10*3/uL (ref 0.1–1.0)
Monocytes Relative: 6 % (ref 3–12)
Neutro Abs: 4.5 10*3/uL (ref 1.7–7.7)
Neutrophils Relative %: 66 % (ref 43–77)
Platelets: 283 10*3/uL (ref 150–400)
RBC: 4.56 MIL/uL (ref 4.22–5.81)
RDW: 13.4 % (ref 11.5–15.5)
WBC: 6.8 10*3/uL (ref 4.0–10.5)

## 2014-05-16 LAB — SURGICAL PCR SCREEN
MRSA, PCR: NEGATIVE
Staphylococcus aureus: POSITIVE — AB

## 2014-05-16 LAB — URINALYSIS, ROUTINE W REFLEX MICROSCOPIC
Bilirubin Urine: NEGATIVE
Glucose, UA: NEGATIVE mg/dL
Hgb urine dipstick: NEGATIVE
Ketones, ur: NEGATIVE mg/dL
Leukocytes, UA: NEGATIVE
Nitrite: NEGATIVE
Protein, ur: NEGATIVE mg/dL
Specific Gravity, Urine: 1.024 (ref 1.005–1.030)
Urobilinogen, UA: 1 mg/dL (ref 0.0–1.0)
pH: 5.5 (ref 5.0–8.0)

## 2014-05-16 LAB — PROTIME-INR
INR: 0.98 (ref 0.00–1.49)
Prothrombin Time: 13 seconds (ref 11.6–15.2)

## 2014-05-16 LAB — APTT: aPTT: 29 seconds (ref 24–37)

## 2014-05-16 NOTE — H&P (Signed)
TOTAL KNEE ADMISSION H&P  Patient is being admitted for right total knee arthroplasty.  Subjective:  Chief Complaint:right knee pain.  HPI: Andrew Haley, 49 y.o. male, has a history of pain and functional disability in the right knee due to arthritis and has failed non-surgical conservative treatments for greater than 12 weeks to includeNSAID's and/or analgesics, corticosteriod injections and activity modification.  Onset of symptoms was gradual, starting 8 years ago with gradually worsening course since that time. The patient noted prior procedures on the knee to include  arthroscopy and menisectomy on the right knee(s).  Patient currently rates pain in the right knee(s) at 7 out of 10 with activity. Patient has night pain, worsening of pain with activity and weight bearing, pain that interferes with activities of daily living, pain with passive range of motion, crepitus and joint swelling.  Patient has evidence of periarticular osteophytes and joint space narrowing by imaging studies.  There is no active infection.  Patient Active Problem List   Diagnosis Date Noted  . H/O total knee replacement 02/22/2014   Past Medical History  Diagnosis Date  . Hypertension   . Hypertriglyceridemia   . Numbness of fingers of both hands   . Arthritis   . Complication of anesthesia     slow to wake up    Past Surgical History  Procedure Laterality Date  . Lesion removal Left 09/23/2013    Procedure: MINOR EXCISION 3 CM SKIN LESION OF LEFT THIGH ;  Surgeon: Jamesetta So, MD;  Location: [facility] ORS;  Service: General;  Laterality: Left;  . Hand surgery  2003  . Total knee arthroplasty Left 02/22/2014    Procedure: LEFT TOTAL KNEE ARTHROPLASTY;  Surgeon: Tobi Bastos, MD;  Location: [facility] ORS;  Service: Orthopedics;  Laterality: Left;      Current outpatient prescriptions:  .  diclofenac (VOLTAREN) 75 MG EC tablet, Take 75 mg by mouth 2 (two) times daily. , Disp: , Rfl:  .  HYDROcodone-acetaminophen  (NORCO) 10-325 MG per tablet, Take 1 tablet by mouth every 6 (six) hours as needed for moderate pain., Disp: , Rfl:  .  lisinopril-hydrochlorothiazide (PRINZIDE,ZESTORETIC) 20-12.5 MG per tablet, Take 1 tablet by mouth every morning., Disp: , Rfl:    No Known Allergies  History  Substance Use Topics  . Smoking status: Never Smoker   . Smokeless tobacco: Not on file  . Alcohol Use: Yes     Comment: OCCASIONAL     Vitals Weight: 305 lb Height: 75in Body Surface Area: 2.63 m Body Mass Index: 38.12 kg/m  Pulse: 88 (Regular)  BP: 160/88 (Sitting, Left Arm, Standard)  Review of Systems  Constitutional: Negative.   HENT: Negative.   Eyes: Negative.   Respiratory: Positive for shortness of breath and wheezing. Negative for cough, hemoptysis and sputum production.   Cardiovascular: Negative.   Gastrointestinal: Negative.   Genitourinary: Positive for frequency. Negative for dysuria, urgency, hematuria and flank pain.  Musculoskeletal: Positive for myalgias, back pain and joint pain. Negative for falls and neck pain.  Skin: Negative.   Neurological: Negative.   Endo/Heme/Allergies: Negative.   Psychiatric/Behavioral: Negative for depression, suicidal ideas, hallucinations, memory loss and substance abuse. The patient is nervous/anxious. The patient does not have insomnia.     Objective:  Physical Exam  Constitutional: He is oriented to person, place, and time. He appears well-developed. No distress.  Obese  HENT:  Head: Normocephalic and atraumatic.  Right Ear: External ear normal.  Left Ear: External ear normal.  Nose: Nose normal.  Mouth/Throat: Oropharynx is clear and moist.  Eyes: Conjunctivae and EOM are normal.  Neck: Normal range of motion. Neck supple.  Cardiovascular: Normal rate, regular rhythm, normal heart sounds and intact distal pulses.   No murmur heard. Respiratory: Effort normal. No respiratory distress. He has wheezes.  Wheezes resolved with cough   GI: Soft. Bowel sounds are normal. He exhibits no distension. There is no tenderness.  Musculoskeletal:       Right hip: Normal.       Left hip: Normal.       Right knee: He exhibits decreased range of motion and swelling. He exhibits no effusion and no erythema. Tenderness found. Medial joint line and lateral joint line tenderness noted.       Left knee: Normal.       Right lower leg: He exhibits no tenderness and no swelling.       Left lower leg: He exhibits no tenderness and no swelling.  Neurological: He is alert and oriented to person, place, and time. He has normal strength and normal reflexes. No sensory deficit.  Skin: No rash noted. He is not diaphoretic. No erythema.  Psychiatric: He has a normal mood and affect. His behavior is normal.      Imaging Review Plain radiographs demonstrate severe degenerative joint disease of the right knee(s). The overall alignment issignificant varus. The bone quality appears to be good for age and reported activity level.  Assessment/Plan:  End stage primary osteoarthritis, right knee   The patient history, physical examination, clinical judgment of the provider and imaging studies are consistent with end stage degenerative joint disease of the right knee(s) and total knee arthroplasty is deemed medically necessary. The treatment options including medical management, injection therapy arthroscopy and arthroplasty were discussed at length. The risks and benefits of total knee arthroplasty were presented and reviewed. The risks due to aseptic loosening, infection, stiffness, patella tracking problems, thromboembolic complications and other imponderables were discussed. The patient acknowledged the explanation, agreed to proceed with the plan and consent was signed. Patient is being admitted for inpatient treatment for surgery, pain control, PT, OT, prophylactic antibiotics, VTE prophylaxis, progressive ambulation and ADL's and discharge planning. The  patient is planning to be discharged home with home health services   TXA IV PCP: [place], PA-C

## 2014-05-18 NOTE — Progress Notes (Signed)
   05/16/14 1339  OBSTRUCTIVE SLEEP APNEA  Have you ever been diagnosed with sleep apnea through a sleep study? No  Do you snore loudly (loud enough to be heard through closed doors)?  0  Do you often feel tired, fatigued, or sleepy during the daytime? 1  Has anyone observed you stop breathing during your sleep? 0  Do you have, or are you being treated for high blood pressure? 1  BMI more than 35 kg/m2? 1  Age over 51 years old? 1  Neck circumference greater than 40 cm/16 inches? 1  Gender: 1

## 2014-05-22 MED ORDER — CEFAZOLIN SODIUM 10 G IJ SOLR
3.0000 g | INTRAMUSCULAR | Status: DC
  Filled 2014-05-22: qty 3000

## 2014-05-23 ENCOUNTER — Inpatient Hospital Stay (HOSPITAL_COMMUNITY): Payer: Commercial Managed Care - HMO | Admitting: Anesthesiology

## 2014-05-23 ENCOUNTER — Inpatient Hospital Stay (HOSPITAL_COMMUNITY)
Admission: RE | Admit: 2014-05-23 | Discharge: 2014-05-25 | DRG: 470 | Disposition: A | Discharge status: Home Health | Payer: Commercial Managed Care - HMO | Source: Ambulatory Visit | Attending: Orthopedic Surgery | Admitting: Orthopedic Surgery

## 2014-05-23 ENCOUNTER — Encounter (HOSPITAL_COMMUNITY): Payer: Self-pay | Admitting: *Deleted

## 2014-05-23 ENCOUNTER — Encounter (HOSPITAL_COMMUNITY)
Admission: RE | Disposition: A | Discharge status: Home Health | Payer: Self-pay | Source: Ambulatory Visit | Attending: Orthopedic Surgery

## 2014-05-23 DIAGNOSIS — M25561 Pain in right knee: Secondary | ICD-10-CM | POA: Diagnosis present

## 2014-05-23 DIAGNOSIS — Z6839 Body mass index (BMI) 39.0-39.9, adult: Secondary | ICD-10-CM | POA: Diagnosis not present

## 2014-05-23 DIAGNOSIS — M1711 Unilateral primary osteoarthritis, right knee: Principal | ICD-10-CM | POA: Diagnosis present

## 2014-05-23 DIAGNOSIS — M179 Osteoarthritis of knee, unspecified: Secondary | ICD-10-CM | POA: Diagnosis not present

## 2014-05-23 DIAGNOSIS — E781 Pure hyperglyceridemia: Secondary | ICD-10-CM | POA: Diagnosis not present

## 2014-05-23 DIAGNOSIS — D62 Acute posthemorrhagic anemia: Secondary | ICD-10-CM | POA: Diagnosis not present

## 2014-05-23 DIAGNOSIS — I10 Essential (primary) hypertension: Secondary | ICD-10-CM | POA: Diagnosis present

## 2014-05-23 DIAGNOSIS — Z96659 Presence of unspecified artificial knee joint: Secondary | ICD-10-CM

## 2014-05-23 HISTORY — PX: TOTAL KNEE ARTHROPLASTY: SHX125

## 2014-05-23 LAB — TYPE AND SCREEN
ABO/RH(D): A POS
Antibody Screen: NEGATIVE

## 2014-05-23 SURGERY — ARTHROPLASTY, KNEE, TOTAL
Anesthesia: General | Site: Knee | Laterality: Right

## 2014-05-23 MED ORDER — NEOSTIGMINE METHYLSULFATE 10 MG/10ML IV SOLN
INTRAVENOUS | Status: AC
  Filled 2014-05-23: qty 1

## 2014-05-23 MED ORDER — HYDROMORPHONE HCL 1 MG/ML IJ SOLN
INTRAMUSCULAR | Status: AC
  Filled 2014-05-23: qty 1

## 2014-05-23 MED ORDER — LACTATED RINGERS IV SOLN
INTRAVENOUS | Status: DC
  Administered 2014-05-23: 1000 mL via INTRAVENOUS

## 2014-05-23 MED ORDER — RIVAROXABAN 10 MG PO TABS
10.0000 mg | ORAL_TABLET | Freq: Every day | ORAL | Status: DC
  Administered 2014-05-24 – 2014-05-25 (×2): 10 mg via ORAL
  Filled 2014-05-23 (×3): qty 1

## 2014-05-23 MED ORDER — PROPOFOL 10 MG/ML IV BOLUS
INTRAVENOUS | Status: AC
  Filled 2014-05-23: qty 20

## 2014-05-23 MED ORDER — ACETAMINOPHEN 10 MG/ML IV SOLN
1000.0000 mg | Freq: Once | INTRAVENOUS | Status: AC
  Administered 2014-05-23: 1000 mg via INTRAVENOUS
  Filled 2014-05-23: qty 100

## 2014-05-23 MED ORDER — CEFAZOLIN SODIUM 1-5 GM-% IV SOLN
1.0000 g | Freq: Four times a day (QID) | INTRAVENOUS | Status: AC
  Administered 2014-05-23 – 2014-05-24 (×2): 1 g via INTRAVENOUS
  Filled 2014-05-23 (×2): qty 50

## 2014-05-23 MED ORDER — POLYETHYLENE GLYCOL 3350 17 G PO PACK
17.0000 g | PACK | Freq: Every day | ORAL | Status: DC | PRN
  Administered 2014-05-24: 17 g via ORAL
  Filled 2014-05-23: qty 1

## 2014-05-23 MED ORDER — METHOCARBAMOL 500 MG PO TABS
500.0000 mg | ORAL_TABLET | Freq: Four times a day (QID) | ORAL | Status: DC | PRN
  Administered 2014-05-24 – 2014-05-25 (×3): 500 mg via ORAL
  Filled 2014-05-23 (×4): qty 1

## 2014-05-23 MED ORDER — ROCURONIUM BROMIDE 100 MG/10ML IV SOLN
INTRAVENOUS | Status: DC | PRN
  Administered 2014-05-23: 10 mg via INTRAVENOUS
  Administered 2014-05-23: 40 mg via INTRAVENOUS

## 2014-05-23 MED ORDER — BUPIVACAINE LIPOSOME 1.3 % IJ SUSP
20.0000 mL | Freq: Once | INTRAMUSCULAR | Status: AC
  Administered 2014-05-23: 20 mL
  Filled 2014-05-23: qty 20

## 2014-05-23 MED ORDER — BUPIVACAINE-EPINEPHRINE (PF) 0.25% -1:200000 IJ SOLN
INTRAMUSCULAR | Status: AC
  Filled 2014-05-23: qty 30

## 2014-05-23 MED ORDER — LIDOCAINE HCL (CARDIAC) 20 MG/ML IV SOLN
INTRAVENOUS | Status: DC | PRN
  Administered 2014-05-23: 100 mg via INTRAVENOUS

## 2014-05-23 MED ORDER — FERROUS SULFATE 325 (65 FE) MG PO TABS
325.0000 mg | ORAL_TABLET | Freq: Three times a day (TID) | ORAL | Status: DC
  Administered 2014-05-24 – 2014-05-25 (×4): 325 mg via ORAL
  Filled 2014-05-23 (×7): qty 1

## 2014-05-23 MED ORDER — ONDANSETRON HCL 4 MG/2ML IJ SOLN
INTRAMUSCULAR | Status: AC
  Filled 2014-05-23: qty 2

## 2014-05-23 MED ORDER — HYDROMORPHONE HCL 2 MG/ML IJ SOLN
INTRAMUSCULAR | Status: AC
  Filled 2014-05-23: qty 1

## 2014-05-23 MED ORDER — HYDROMORPHONE HCL 1 MG/ML IJ SOLN
1.0000 mg | INTRAMUSCULAR | Status: DC | PRN
  Administered 2014-05-23 – 2014-05-24 (×5): 1 mg via INTRAVENOUS
  Filled 2014-05-23 (×5): qty 1

## 2014-05-23 MED ORDER — CHLORHEXIDINE GLUCONATE 4 % EX LIQD: 60.0000 mL | Freq: Once | CUTANEOUS | Status: DC

## 2014-05-23 MED ORDER — MIDAZOLAM HCL 2 MG/2ML IJ SOLN
INTRAMUSCULAR | Status: AC
  Filled 2014-05-23: qty 2

## 2014-05-23 MED ORDER — TRANEXAMIC ACID 100 MG/ML IV SOLN
1000.0000 mg | INTRAVENOUS | Status: AC
  Administered 2014-05-23: 1000 mg via INTRAVENOUS
  Filled 2014-05-23: qty 10

## 2014-05-23 MED ORDER — ONDANSETRON HCL 4 MG/2ML IJ SOLN
INTRAMUSCULAR | Status: DC | PRN
  Administered 2014-05-23: 4 mg via INTRAVENOUS

## 2014-05-23 MED ORDER — ALUM & MAG HYDROXIDE-SIMETH 200-200-20 MG/5ML PO SUSP: 30.0000 mL | ORAL | Status: DC | PRN

## 2014-05-23 MED ORDER — ROCURONIUM BROMIDE 100 MG/10ML IV SOLN
INTRAVENOUS | Status: AC
  Filled 2014-05-23: qty 1

## 2014-05-23 MED ORDER — METHOCARBAMOL 1000 MG/10ML IJ SOLN
500.0000 mg | Freq: Four times a day (QID) | INTRAVENOUS | Status: DC | PRN
  Administered 2014-05-23 – 2014-05-24 (×2): 500 mg via INTRAVENOUS
  Filled 2014-05-23 (×4): qty 5

## 2014-05-23 MED ORDER — FENTANYL CITRATE 0.05 MG/ML IJ SOLN
INTRAMUSCULAR | Status: DC | PRN
Start: 2014-05-23 — End: 2014-05-23
  Administered 2014-05-23: 100 ug via INTRAVENOUS
  Administered 2014-05-23 (×2): 25 ug via INTRAVENOUS
  Administered 2014-05-23: 50 ug via INTRAVENOUS
  Administered 2014-05-23: 100 ug via INTRAVENOUS
  Administered 2014-05-23: 50 ug via INTRAVENOUS

## 2014-05-23 MED ORDER — MIDAZOLAM HCL 5 MG/5ML IJ SOLN
INTRAMUSCULAR | Status: DC | PRN
  Administered 2014-05-23 (×2): 1 mg via INTRAVENOUS

## 2014-05-23 MED ORDER — LACTATED RINGERS IV SOLN: INTRAVENOUS | Status: DC

## 2014-05-23 MED ORDER — NEOSTIGMINE METHYLSULFATE 10 MG/10ML IV SOLN
INTRAVENOUS | Status: DC | PRN
  Administered 2014-05-23: 5 mg via INTRAVENOUS

## 2014-05-23 MED ORDER — HYDROMORPHONE HCL 1 MG/ML IJ SOLN
0.2500 mg | INTRAMUSCULAR | Status: DC | PRN
  Administered 2014-05-23 (×2): 0.5 mg via INTRAVENOUS

## 2014-05-23 MED ORDER — HYDROCHLOROTHIAZIDE 12.5 MG PO CAPS
12.5000 mg | ORAL_CAPSULE | Freq: Every day | ORAL | Status: DC
  Administered 2014-05-24 – 2014-05-25 (×2): 12.5 mg via ORAL
  Filled 2014-05-23 (×2): qty 1

## 2014-05-23 MED ORDER — MENTHOL 3 MG MT LOZG: 1.0000 | LOZENGE | OROMUCOSAL | Status: DC | PRN

## 2014-05-23 MED ORDER — ONDANSETRON HCL 4 MG/2ML IJ SOLN
4.0000 mg | Freq: Four times a day (QID) | INTRAMUSCULAR | Status: DC | PRN
Start: 2014-05-23 — End: 2014-05-25

## 2014-05-23 MED ORDER — HYDROCODONE-ACETAMINOPHEN 5-325 MG PO TABS
1.0000 | ORAL_TABLET | ORAL | Status: DC | PRN
  Administered 2014-05-24: 2 via ORAL
  Filled 2014-05-23: qty 2

## 2014-05-23 MED ORDER — DEXAMETHASONE SODIUM PHOSPHATE 10 MG/ML IJ SOLN
INTRAMUSCULAR | Status: AC
  Filled 2014-05-23: qty 1

## 2014-05-23 MED ORDER — ACETAMINOPHEN 650 MG RE SUPP: 650.0000 mg | Freq: Four times a day (QID) | RECTAL | Status: DC | PRN

## 2014-05-23 MED ORDER — BUPIVACAINE HCL (PF) 0.25 % IJ SOLN
INTRAMUSCULAR | Status: DC | PRN
  Administered 2014-05-23: 20 mL

## 2014-05-23 MED ORDER — HYDROMORPHONE HCL 1 MG/ML IJ SOLN
INTRAMUSCULAR | Status: DC | PRN
  Administered 2014-05-23 (×4): 0.5 mg via INTRAVENOUS

## 2014-05-23 MED ORDER — SODIUM CHLORIDE 0.9 % IR SOLN
Status: DC | PRN
  Administered 2014-05-23: 500 mL

## 2014-05-23 MED ORDER — LIDOCAINE HCL (CARDIAC) 20 MG/ML IV SOLN
INTRAVENOUS | Status: AC
  Filled 2014-05-23: qty 5

## 2014-05-23 MED ORDER — FLEET ENEMA 7-19 GM/118ML RE ENEM: 1.0000 | ENEMA | Freq: Once | RECTAL | Status: AC | PRN

## 2014-05-23 MED ORDER — ONDANSETRON HCL 4 MG PO TABS: 4.0000 mg | ORAL_TABLET | Freq: Four times a day (QID) | ORAL | Status: DC | PRN

## 2014-05-23 MED ORDER — DEXTROSE 5 % IV SOLN
3.0000 g | INTRAVENOUS | Status: DC | PRN
  Administered 2014-05-23: 3 g via INTRAVENOUS

## 2014-05-23 MED ORDER — SUCCINYLCHOLINE CHLORIDE 20 MG/ML IJ SOLN
INTRAMUSCULAR | Status: DC | PRN
  Administered 2014-05-23: 120 mg via INTRAVENOUS

## 2014-05-23 MED ORDER — SODIUM CHLORIDE 0.9 % IJ SOLN
INTRAMUSCULAR | Status: AC
  Filled 2014-05-23: qty 50

## 2014-05-23 MED ORDER — GLYCOPYRROLATE 0.2 MG/ML IJ SOLN
INTRAMUSCULAR | Status: AC
  Filled 2014-05-23: qty 4

## 2014-05-23 MED ORDER — DEXAMETHASONE SODIUM PHOSPHATE 10 MG/ML IJ SOLN
INTRAMUSCULAR | Status: DC | PRN
  Administered 2014-05-23: 10 mg via INTRAVENOUS

## 2014-05-23 MED ORDER — GLYCOPYRROLATE 0.2 MG/ML IJ SOLN
INTRAMUSCULAR | Status: DC | PRN
  Administered 2014-05-23: .8 mg via INTRAVENOUS

## 2014-05-23 MED ORDER — BISACODYL 5 MG PO TBEC: 5.0000 mg | DELAYED_RELEASE_TABLET | Freq: Every day | ORAL | Status: DC | PRN

## 2014-05-23 MED ORDER — POLYMYXIN B SULFATE 500000 UNITS IJ SOLR
INTRAMUSCULAR | Status: AC
  Filled 2014-05-23: qty 1

## 2014-05-23 MED ORDER — LISINOPRIL 20 MG PO TABS
20.0000 mg | ORAL_TABLET | Freq: Every day | ORAL | Status: DC
  Administered 2014-05-24 – 2014-05-25 (×2): 20 mg via ORAL
  Filled 2014-05-23 (×2): qty 1

## 2014-05-23 MED ORDER — HYDROMORPHONE HCL 2 MG PO TABS
4.0000 mg | ORAL_TABLET | ORAL | Status: DC | PRN
  Administered 2014-05-23 – 2014-05-25 (×5): 4 mg via ORAL
  Filled 2014-05-23 (×5): qty 2

## 2014-05-23 MED ORDER — FENTANYL CITRATE 0.05 MG/ML IJ SOLN
INTRAMUSCULAR | Status: AC
  Filled 2014-05-23: qty 5

## 2014-05-23 MED ORDER — CELECOXIB 200 MG PO CAPS
200.0000 mg | ORAL_CAPSULE | Freq: Two times a day (BID) | ORAL | Status: DC
  Administered 2014-05-23 – 2014-05-25 (×4): 200 mg via ORAL
  Filled 2014-05-23 (×5): qty 1

## 2014-05-23 MED ORDER — ACETAMINOPHEN 325 MG PO TABS
650.0000 mg | ORAL_TABLET | Freq: Four times a day (QID) | ORAL | Status: DC | PRN
  Administered 2014-05-24: 650 mg via ORAL
  Filled 2014-05-23: qty 2

## 2014-05-23 MED ORDER — PHENOL 1.4 % MT LIQD
1.0000 | OROMUCOSAL | Status: DC | PRN
  Filled 2014-05-23: qty 177

## 2014-05-23 MED ORDER — SODIUM CHLORIDE 0.9 % IJ SOLN
INTRAMUSCULAR | Status: DC | PRN
  Administered 2014-05-23: 20 mL

## 2014-05-23 MED ORDER — THROMBIN 5000 UNITS EX SOLR
CUTANEOUS | Status: AC
  Filled 2014-05-23: qty 5000

## 2014-05-23 MED ORDER — PROPOFOL 10 MG/ML IV BOLUS
INTRAVENOUS | Status: DC | PRN
  Administered 2014-05-23: 250 mg via INTRAVENOUS

## 2014-05-23 MED ORDER — GELATIN ABSORBABLE MT POWD
OROMUCOSAL | Status: DC | PRN
  Administered 2014-05-23: 14:00:00 via TOPICAL

## 2014-05-23 MED ORDER — LACTATED RINGERS IV SOLN
INTRAVENOUS | Status: DC
  Administered 2014-05-23 – 2014-05-24 (×2): via INTRAVENOUS

## 2014-05-23 MED ORDER — FENTANYL CITRATE 0.05 MG/ML IJ SOLN
INTRAMUSCULAR | Status: AC
  Filled 2014-05-23: qty 2

## 2014-05-23 MED ORDER — LISINOPRIL-HYDROCHLOROTHIAZIDE 20-12.5 MG PO TABS: 1.0000 | ORAL_TABLET | Freq: Every morning | ORAL | Status: DC

## 2014-05-23 SURGICAL SUPPLY — 77 items
ANCH SUT 2 5.5 BABSR ASCP (Orthopedic Implant) ×1 IMPLANT
ANCHOR PEEK ZIP 5.5 NDL NO2 (Orthopedic Implant) ×2 IMPLANT
BAG DECANTER FOR FLEXI CONT (MISCELLANEOUS) ×3 IMPLANT
BAG SPEC THK2 15X12 ZIP CLS (MISCELLANEOUS)
BAG ZIPLOCK 12X15 (MISCELLANEOUS) IMPLANT
BANDAGE ELASTIC 4 VELCRO ST LF (GAUZE/BANDAGES/DRESSINGS) ×3 IMPLANT
BANDAGE ELASTIC 6 VELCRO ST LF (GAUZE/BANDAGES/DRESSINGS) ×3 IMPLANT
BANDAGE ESMARK 6X9 LF (GAUZE/BANDAGES/DRESSINGS) ×1 IMPLANT
BLADE SAG 18X100X1.27 (BLADE) ×3 IMPLANT
BLADE SAW SGTL 11.0X1.19X90.0M (BLADE) ×3 IMPLANT
BNDG CMPR 9X6 STRL LF SNTH (GAUZE/BANDAGES/DRESSINGS) ×1
BNDG ESMARK 6X9 LF (GAUZE/BANDAGES/DRESSINGS) ×3
BONE CEMENT GENTAMICIN (Cement) ×6 IMPLANT
CAP KNEE TOTAL 3 SIGMA ×2 IMPLANT
CEMENT BONE GENTAMICIN 40 (Cement) ×2 IMPLANT
CUFF TOURN SGL QUICK 34 (TOURNIQUET CUFF) ×3
CUFF TRNQT CYL 34X4X40X1 (TOURNIQUET CUFF) ×1 IMPLANT
DRAPE EXTREMITY T 121X128X90 (DRAPE) ×3 IMPLANT
DRAPE INCISE IOBAN 66X45 STRL (DRAPES) ×2 IMPLANT
DRAPE POUCH INSTRU U-SHP 10X18 (DRAPES) ×3 IMPLANT
DRAPE U-SHAPE 47X51 STRL (DRAPES) ×3 IMPLANT
DRSG AQUACEL AG ADV 3.5X10 (GAUZE/BANDAGES/DRESSINGS) ×3 IMPLANT
DRSG AQUACEL AG ADV 3.5X14 (GAUZE/BANDAGES/DRESSINGS) ×2 IMPLANT
DRSG PAD ABDOMINAL 8X10 ST (GAUZE/BANDAGES/DRESSINGS) IMPLANT
DRSG TEGADERM 4X4.75 (GAUZE/BANDAGES/DRESSINGS) ×3 IMPLANT
DURAPREP 26ML APPLICATOR (WOUND CARE) ×3 IMPLANT
ELECT REM PT RETURN 9FT ADLT (ELECTROSURGICAL) ×3
ELECTRODE REM PT RTRN 9FT ADLT (ELECTROSURGICAL) ×1 IMPLANT
EVACUATOR 1/8 PVC DRAIN (DRAIN) ×3 IMPLANT
FACESHIELD WRAPAROUND (MASK) ×15 IMPLANT
FACESHIELD WRAPAROUND OR TEAM (MASK) ×5 IMPLANT
GAUZE SPONGE 2X2 8PLY STRL LF (GAUZE/BANDAGES/DRESSINGS) ×1 IMPLANT
GLOVE BIOGEL PI IND STRL 6.5 (GLOVE) ×1 IMPLANT
GLOVE BIOGEL PI IND STRL 8 (GLOVE) ×1 IMPLANT
GLOVE BIOGEL PI INDICATOR 6.5 (GLOVE) ×2
GLOVE BIOGEL PI INDICATOR 8 (GLOVE) ×2
GLOVE ECLIPSE 8.0 STRL XLNG CF (GLOVE) ×6 IMPLANT
GLOVE SURG SS PI 6.5 STRL IVOR (GLOVE) ×3 IMPLANT
GOWN STRL REUS W/TWL LRG LVL3 (GOWN DISPOSABLE) ×3 IMPLANT
GOWN STRL REUS W/TWL XL LVL3 (GOWN DISPOSABLE) ×7 IMPLANT
HANDPIECE INTERPULSE COAX TIP (DISPOSABLE) ×3
IMMOBILIZER KNEE 20 (SOFTGOODS) ×3
IMMOBILIZER KNEE 20 THIGH 36 (SOFTGOODS) ×1 IMPLANT
KIT BASIN OR (CUSTOM PROCEDURE TRAY) ×3 IMPLANT
LIQUID BAND (GAUZE/BANDAGES/DRESSINGS) ×3 IMPLANT
MANIFOLD NEPTUNE II (INSTRUMENTS) ×3 IMPLANT
NDL SAFETY ECLIPSE 18X1.5 (NEEDLE) IMPLANT
NEEDLE HYPO 18GX1.5 SHARP (NEEDLE)
NEEDLE HYPO 22GX1.5 SAFETY (NEEDLE) ×6 IMPLANT
NS IRRIG 1000ML POUR BTL (IV SOLUTION) IMPLANT
PACK TOTAL JOINT (CUSTOM PROCEDURE TRAY) ×3 IMPLANT
PADDING CAST COTTON 6X4 STRL (CAST SUPPLIES) ×2 IMPLANT
PEN SKIN MARKING BROAD (MISCELLANEOUS) ×3 IMPLANT
POSITIONER SURGICAL ARM (MISCELLANEOUS) ×3 IMPLANT
SET HNDPC FAN SPRY TIP SCT (DISPOSABLE) ×1 IMPLANT
SET PAD KNEE POSITIONER (MISCELLANEOUS) ×3 IMPLANT
SPONGE GAUZE 2X2 STER 10/PKG (GAUZE/BANDAGES/DRESSINGS) ×2
SPONGE LAP 18X18 X RAY DECT (DISPOSABLE) ×2 IMPLANT
SPONGE SURGIFOAM ABS GEL 100 (HEMOSTASIS) ×3 IMPLANT
STAPLER VISISTAT 35W (STAPLE) IMPLANT
SUCTION FRAZIER 12FR DISP (SUCTIONS) ×3 IMPLANT
SUT BONE WAX W31G (SUTURE) ×3 IMPLANT
SUT MNCRL AB 4-0 PS2 18 (SUTURE) ×3 IMPLANT
SUT VIC AB 1 CT1 27 (SUTURE) ×9
SUT VIC AB 1 CT1 27XBRD ANTBC (SUTURE) ×2 IMPLANT
SUT VIC AB 2-0 CT1 27 (SUTURE) ×9
SUT VIC AB 2-0 CT1 TAPERPNT 27 (SUTURE) ×3 IMPLANT
SUT VLOC 180 0 24IN GS25 (SUTURE) ×3 IMPLANT
SYR 20CC LL (SYRINGE) ×6 IMPLANT
SYR 50ML LL SCALE MARK (SYRINGE) ×3 IMPLANT
TOWEL OR 17X26 10 PK STRL BLUE (TOWEL DISPOSABLE) ×3 IMPLANT
TOWEL OR NON WOVEN STRL DISP B (DISPOSABLE) ×2 IMPLANT
TOWER CARTRIDGE SMART MIX (DISPOSABLE) ×3 IMPLANT
TRAY FOLEY CATH 14FRSI W/METER (CATHETERS) ×3 IMPLANT
WATER STERILE IRR 1500ML POUR (IV SOLUTION) ×3 IMPLANT
WRAP KNEE MAXI GEL POST OP (GAUZE/BANDAGES/DRESSINGS) ×3 IMPLANT
YANKAUER SUCT BULB TIP 10FT TU (MISCELLANEOUS) ×3 IMPLANT

## 2014-05-23 NOTE — Anesthesia Procedure Notes (Signed)
Procedure Name: Intubation Date/Time: 05/23/2014 12:31 PM Performed by: Carleene Cooper A Pre-anesthesia Checklist: Timeout performed, Patient identified, Emergency Drugs available, Suction available and Patient being monitored Patient Re-evaluated:Patient Re-evaluated prior to inductionOxygen Delivery Method: Circle system utilized Preoxygenation: Pre-oxygenation with 100% oxygen Intubation Type: IV induction Ventilation: Mask ventilation without difficulty Laryngoscope Size: Mac and 4 Grade View: Grade I Tube type: Oral Tube size: 7.5 mm Number of attempts: 1 Airway Equipment and Method: Stylet Placement Confirmation: breath sounds checked- equal and bilateral,  ETT inserted through vocal cords under direct vision and positive ETCO2 Secured at: 22 cm Tube secured with: Tape Dental Injury: Teeth and Oropharynx as per pre-operative assessment

## 2014-05-23 NOTE — Progress Notes (Signed)
Utilization review completed.  

## 2014-05-23 NOTE — Anesthesia Postprocedure Evaluation (Signed)
  Anesthesia Post-op Note  Patient: Andrew Haley  Procedure(s) Performed: Procedure(s) (LRB): RIGHT TOTAL KNEE ARTHROPLASTY (Right)  Patient Location: PACU  Anesthesia Type: General  Level of Consciousness: awake and alert   Airway and Oxygen Therapy: Patient Spontanous Breathing  Post-op Pain: mild  Post-op Assessment: Post-op Vital signs reviewed, Patient's Cardiovascular Status Stable, Respiratory Function Stable, Patent Airway and No signs of Nausea or vomiting  Last Vitals:  Filed Vitals:   05/23/14 1600  BP:   Pulse:   Temp:   Resp: 18    Post-op Vital Signs: stable   Complications: No apparent anesthesia complications

## 2014-05-23 NOTE — Transfer of Care (Signed)
Immediate Anesthesia Transfer of Care Note  Patient: Andrew Haley  Procedure(s) Performed: Procedure(s): RIGHT TOTAL KNEE ARTHROPLASTY (Right)  Patient Location: PACU  Anesthesia Type:General  Level of Consciousness: awake, alert , oriented and patient cooperative  Airway & Oxygen Therapy: Patient Spontanous Breathing and Patient connected to face mask oxygen  Post-op Assessment: Report given to RN and Post -op Vital signs reviewed and stable  Post vital signs: Reviewed and stable  Last Vitals:  Filed Vitals:   05/23/14 1009  BP: 149/89  Pulse: 80  Temp: 36.4 C  Resp: 18    Complications: No apparent anesthesia complications

## 2014-05-23 NOTE — Brief Op Note (Signed)
05/23/2014  2:31 PM  PATIENT:  Andrew Haley  49 y.o. male  PRE-OPERATIVE DIAGNOSIS:  right knee Primary osteoarthritis and Morbid Obesity.Flexion Contractures.  POST-OPERATIVE DIAGNOSIS:  right knee Primary  Osteoarthritis and Morbid Obesity  PROCEDURE:  Procedure(s): RIGHT TOTAL KNEE ARTHROPLASTY (Right) and release of Contractores  SURGEON:  Surgeon(s) and Role:    * Latanya Maudlin, MD - Primary  PHYSICIAN ASSISTANT:Amber Hillside PA   ASSISTANTS: Ardeen Jourdain  ANESTHESIA:   general  EBL:  Total I/O In: 2000 [I.V.:2000] Out: 250 [Urine:225; Blood:25]  BLOOD ADMINISTERED:none  DRAINS: (Two ) Hemovact drain(s) in the Right Knee with  Suction Open   LOCAL MEDICATIONS USED:  MARCAINE 20cc 68f 0.25%with Epinephrine and Exparel 20cc mixed with 20cc of Normal Saline.    SPECIMEN:  No Specimen  DISPOSITION OF SPECIMEN:  N/A  COUNTS:  YES  TOURNIQUET:  * Missing tourniquet times found for documented tourniquets in log:  [id number] *  DICTATION: .Other Dictation: Dictation Number [id number]  PLAN OF CARE: Admit to inpatient   PATIENT DISPOSITION:  Stable in OR   Delay start of Pharmacological VTE agent (>24hrs) due to surgical blood loss or risk of bleeding: yes

## 2014-05-23 NOTE — Interval H&P Note (Signed)
History and Physical Interval Note:  05/23/2014 12:00 PM  Andrew Haley  has presented today for surgery, with the diagnosis of right knee osteoarthritis  The various methods of treatment have been discussed with the patient and family. After consideration of risks, benefits and other options for treatment, the patient has consented to  Procedure(s): RIGHT TOTAL KNEE ARTHROPLASTY (Right) as a surgical intervention .  The patient's history has been reviewed, patient examined, no change in status, stable for surgery.  I have reviewed the patient's chart and labs.  Questions were answered to the patient's satisfaction.     Vastie Douty A

## 2014-05-23 NOTE — Anesthesia Preprocedure Evaluation (Addendum)
Anesthesia Evaluation  Patient identified by MRN, date of birth, ID band Patient awake    Reviewed: Allergy & Precautions, NPO status , Patient's Chart, lab work & pertinent test results  Airway Mallampati: III  TM Distance: >3 FB Neck ROM: Full    Dental  (+) Missing, Dental Advisory Given Right upper front tooth missing:   Pulmonary neg pulmonary ROS,  breath sounds clear to auscultation  Pulmonary exam normal       Cardiovascular hypertension, Pt. on medications Rhythm:Regular Rate:Normal     Neuro/Psych negative neurological ROS  negative psych ROS   GI/Hepatic negative GI ROS, Neg liver ROS,   Endo/Other  Morbid obesity  Renal/GU negative Renal ROS  negative genitourinary   Musculoskeletal negative musculoskeletal ROS (+)   Abdominal (+) + obese,   Peds negative pediatric ROS (+)  Hematology negative hematology ROS (+)   Anesthesia Other Findings   Reproductive/Obstetrics negative OB ROS                          Anesthesia Physical Anesthesia Plan  ASA: III  Anesthesia Plan: General   Post-op Pain Management:    Induction: Intravenous  Airway Management Planned: Oral ETT  Additional Equipment:   Intra-op Plan:   Post-operative Plan: Extubation in OR  Informed Consent:   Plan Discussed with: Surgeon  Anesthesia Plan Comments:         Anesthesia Quick Evaluation

## 2014-05-24 LAB — CBC
HCT: 33.7 % — ABNORMAL LOW (ref 39.0–52.0)
Hemoglobin: 10.7 g/dL — ABNORMAL LOW (ref 13.0–17.0)
MCH: 26.4 pg (ref 26.0–34.0)
MCHC: 31.8 g/dL (ref 30.0–36.0)
MCV: 83 fL (ref 78.0–100.0)
Platelets: 302 10*3/uL (ref 150–400)
RBC: 4.06 MIL/uL — ABNORMAL LOW (ref 4.22–5.81)
RDW: 13.5 % (ref 11.5–15.5)
WBC: 12.5 10*3/uL — ABNORMAL HIGH (ref 4.0–10.5)

## 2014-05-24 LAB — BASIC METABOLIC PANEL
Anion gap: 7 (ref 5–15)
BUN: 15 mg/dL (ref 6–23)
CALCIUM: 8.3 mg/dL — AB (ref 8.4–10.5)
CHLORIDE: 99 mmol/L (ref 96–112)
CO2: 29 mmol/L (ref 19–32)
CREATININE: 0.79 mg/dL (ref 0.50–1.35)
GFR calc Af Amer: >90 mL/min (ref >90)
GFR calc non Af Amer: >90 mL/min (ref >90)
Glucose, Bld: 123 mg/dL — ABNORMAL HIGH (ref 70–99)
Potassium: 3.7 mmol/L (ref 3.5–5.1)
Sodium: 135 mmol/L (ref 135–145)

## 2014-05-24 MED ORDER — DIAZEPAM 2 MG PO TABS
2.0000 mg | ORAL_TABLET | Freq: Four times a day (QID) | ORAL | Status: DC | PRN
  Administered 2014-05-24 (×3): 2 mg via ORAL
  Filled 2014-05-24 (×3): qty 1

## 2014-05-24 MED ORDER — OXYCODONE HCL 5 MG PO TABS
10.0000 mg | ORAL_TABLET | ORAL | Status: DC | PRN
  Administered 2014-05-24: 20 mg via ORAL
  Administered 2014-05-24 (×3): 10 mg via ORAL
  Administered 2014-05-25 (×3): 20 mg via ORAL
  Filled 2014-05-24: qty 4
  Filled 2014-05-24 (×3): qty 2
  Filled 2014-05-24 (×3): qty 4

## 2014-05-24 MED ORDER — DOCUSATE SODIUM 100 MG PO CAPS
100.0000 mg | ORAL_CAPSULE | Freq: Two times a day (BID) | ORAL | Status: DC
  Administered 2014-05-24 – 2014-05-25 (×3): 100 mg via ORAL

## 2014-05-24 NOTE — Progress Notes (Signed)
OT Cancellation Note  Patient Details Name: Andrew Haley MRN: [id number] DOB: September 17, 1965   Cancelled Treatment:    Reason Eval/Treat Not Completed: PT screened, no needs identified, will sign off.  Pt has had recent knee surgery and feels like he has everything down.  Foch Rosenwald 05/24/2014, 1:17 PM  Lesle Chris, OTR/L [phone] 05/24/2014

## 2014-05-24 NOTE — Evaluation (Signed)
Physical Therapy Evaluation Patient Details Name: DENISE BRAMBLETT MRN: [id number] DOB: 30-May-1965 Today's Date: 05/24/2014   History of Present Illness  [name]  Clinical Impression  Patient reports this surgery is much better than previous in January. Pt. Will benefit from OPT to address problems listed in note below.    Follow Up Recommendations Home health PT;Supervision/Assistance - 24 hour    Equipment Recommendations  None recommended by PT    Recommendations for Other Services       Precautions / Restrictions Precautions Precautions: Knee;Fall Required Braces or Orthoses: Knee Immobilizer - Right Knee Immobilizer - Right: Discontinue once straight leg raise with < 10 degree lag Restrictions Weight Bearing Restrictions: No      Mobility  Bed Mobility Overal bed mobility: Needs Assistance Bed Mobility: Supine to Sit     Supine to sit: Min assist     General bed mobility comments: R leg support  Transfers Overall transfer level: Needs assistance Equipment used: Rolling walker (2 wheeled) Transfers: Sit to/from Stand Sit to Stand: Min assist;From elevated surface         General transfer comment: cues for sequence , hand placement  Ambulation/Gait Ambulation/Gait assistance: Min assist Ambulation Distance (Feet): 150 Feet Assistive device: Rolling walker (2 wheeled) Gait Pattern/deviations: Step-to pattern;Antalgic;Decreased stance time - right;Decreased step length - right     General Gait Details: cues for sequence and  posture  Stairs            Wheelchair Mobility    Modified Rankin (Stroke Patients Only)       Balance                                             Pertinent Vitals/Pain Pain Assessment: 0-10 Pain Score: 8  Pain Descriptors / Indicators: Aching Pain Intervention(s): Monitored during session;Premedicated before session;Repositioned;Ice applied    Home Living Family/patient expects to be discharged  to:: Private residence Living Arrangements: Other relatives Available Help at Discharge: Friend(s) Type of Home: Mobile home Home Access: Stairs to enter Entrance Stairs-Rails: None Entrance Stairs-Number of Steps: 2 Home Layout: One level Home Equipment: Environmental consultant - 2 wheels;Crutches Additional Comments: going to stay with neighbors    Prior Function Level of Independence: Independent               Hand Dominance   Dominant Hand: Right    Extremity/Trunk Assessment               Lower Extremity Assessment: RLE deficits/detail RLE Deficits / Details: requires assist for SLR       Communication      Cognition Arousal/Alertness: Awake/alert Behavior During Therapy: WFL for tasks assessed/performed Overall Cognitive Status: Within Functional Limits for tasks assessed                      General Comments      Exercises        Assessment/Plan    PT Assessment Patient needs continued PT services  PT Diagnosis Difficulty walking;Acute pain   PT Problem List Decreased strength;Decreased range of motion;Decreased activity tolerance;Decreased mobility;Decreased knowledge of precautions;Decreased safety awareness;Decreased knowledge of use of DME;Pain  PT Treatment Interventions DME instruction;Gait training;Stair training;Functional mobility training;Therapeutic exercise;Patient/family education   PT Goals (Current goals can be found in the Care Plan section) Acute Rehab PT Goals Patient Stated Goal: to go  home PT Goal Formulation: With patient Time For Goal Achievement: 05/31/14 Potential to Achieve Goals: Good    Frequency 7X/week   Barriers to discharge        Co-evaluation               End of Session Equipment Utilized During Treatment: Right knee immobilizer Activity Tolerance: Patient tolerated treatment well Patient left: in bed;with call bell/phone within reach Nurse Communication: Mobility status         Time:  7342-8768 PT Time Calculation (min) (ACUTE ONLY): 22 min   Charges:   PT Evaluation $Initial PT Evaluation Tier I: 1 Procedure     PT G CodesClaretha Cooper 05/24/2014, 11:14 AM

## 2014-05-24 NOTE — Op Note (Signed)
NAMEJOZEF, EISENBEIS NO.:  [id number]  MEDICAL RECORD NO.:  [id number]  LOCATION:  7591                         FACILITY:  [facility]  PHYSICIAN:  Kipp Brood. Roi Jafari, M.D.DATE OF BIRTH:  Mar 26, 1965  DATE OF PROCEDURE:  05/23/2014 DATE OF DISCHARGE:                              OPERATIVE REPORT   SURGEON:  Kipp Brood. Quint Chestnut, MD  OPERATIVE ASSISTANT:  Ardeen Jourdain, PA  PREOPERATIVE DIAGNOSES: 1. Severe flexion contractures of the right knee. 2. Severe primary osteoarthritis with bone on bone. 3. Morbid obesity.  POSTOPERATIVE DIAGNOSES: 1. Severe flexion contractures of the right knee. 2. Severe primary osteoarthritis with bone on bone. 3. Morbid obesity.  OPERATION: 1. Right total knee arthroplasty utilizing DePuy system.  All 3     components were cemented and gentamicin was used in the cement. 2. I did a lateral release. 3. We utilized 1 anchor for fixation of the patellar tendon.  DESCRIPTION OF PROCEDURE:  Under general anesthesia with the patient on the operating table, routine orthopedic prep and draping of the right lower extremity was carried out.  The appropriate time-out was first carried out.  I also marked the appropriate right leg in the holding area.  At this time, after the prep and draping and after the time-out, the leg was exsanguinated with Esmarch, tourniquet was elevated at 325 mmHg.  The knee then was placed in a DeMayo knee holder and flexed.  An anterior approach to the knee was carried out.  Bleeders were identified and cauterized.  I created 2 flaps in usual fashion.  I then carried out a median parapatellar incision reflecting patella laterally and removed the numerous spurs from the patella, tibia, and femur.  After that I did medial and lateral meniscectomies.  I did an excision of the anterior- posterior cruciate ligaments as well.  At this time, the initial drill hole was made in the intercondylar notch.  I then inserted the  canal finder and made sure we were going to the canal.  Following that, I thoroughly irrigated out the femoral canal, then removed 12 mm thickness off the distal femur.  The contractures of the knee were markedly obese. Note, right before surgery, he had 2 g of IV Ancef.  He was also given tranexamic acid.  After this, we then measured the femur to be a size 4. We made our appropriate anterior-posterior chamfer cuts for a size 4 right posterior cruciate sacrificing femoral component.  Next, attention was directed to the tibial plateau.  Initial drill hole was made in the tibial plateau and then the guide rod was inserted intramedullary.  At this time, I removed 8 mm thickness off the affected medial side. Following that, we then went back with several soft cuts to make sure we had a nice even cut.  Following that, we then inserted our spacer blocks and removed the posterior spurs from the distal femur medially and laterally.  We also had to carry further release posteromedially.  At this particular time, we then inserted our spacer blocks, had good stability with a 12.5 mm thickness.  We then went on and continued our preparation.  We cut our  keel cut out of the tibial plateau.  I then cut the notch cut out of the distal femur.  Trial components were inserted.  We first tried a 10 mm thickness insert.  It was little loose so we went to 12.5 and it was stable.  We then did a resurfacing procedure on the patella for a size 41 patella.  Three drill holes were made in the articular surface of the patella.  The trial component was inserted and it fit very nicely.  We then removed all trial components, thoroughly water picked out the knee, dried the knee up and cemented all 3 components in simultaneously.  Once the cement was hardened, we removed all loose pieces of cement.  I thoroughly water picked out the posterior knee as well to make sure there were no loose cement present.  I then  injected 20 mL of 0.25% Marcaine with epinephrine into the surrounding soft tissue.  We then inserted our permanent rotating platform size 4, 12.5 mm thickness, reduced the knee, went through motion and had excellent function.  We felt patella was a little tight laterally, so we did a partial lateral release as well. Two Hemovac drains were inserted.  We used two at this time because the first surgery on the opposite knee the 1 Hemovac blocked off and the patient was in quite a bit of discomfort.  So after this, we then inserted 1 anchor in the proximal tibia and then anchored down the remaining part of the patellar tendon.  Remaining part of the wound then was closed in layers over 2 Hemovac drains.  We did utilize the subcuticular closing suture.  Sterile dressings were applied.  Abdomen was closed.  We injected our mixture of 20 mL of Exparel with 20 mL of normal saline before the wound was closed.          ______________________________ Kipp Brood Gladstone Lighter, M.D.     RAG/MEDQ  D:  05/23/2014  T:  05/24/2014  Job:  [id number]

## 2014-05-24 NOTE — Progress Notes (Signed)
   Subjective: 1 Day Post-Op Procedure(s) (LRB): RIGHT TOTAL KNEE ARTHROPLASTY (Right) Patient reports pain as moderate.   Patient seen in rounds with Dr. Gladstone Lighter. Patient is well, but has had some minor complaints of muscle spasms. He reports that he didn't get much rest last night but his knee is feeling better than the left one did post op day one. No SOB or chest pain.    Objective: Vital signs in last 24 hours: Temp:  [97.6 F (36.4 C)-99 F (37.2 C)] 99 F (37.2 C) (04/13 0444) Pulse Rate:  [80-100] 85 (04/13 0444) Resp:  [12-18] 16 (04/13 0444) BP: (137-173)/(78-103) 137/78 mmHg (04/13 0444) SpO2:  [92 %-100 %] 96 % (04/13 0444) Weight:  [141.976 kg (313 lb)] 141.976 kg (313 lb) (04/12 1009)  Intake/Output from previous day:  Intake/Output Summary (Last 24 hours) at 05/24/14 0812 Last data filed at 05/24/14 0700  Gross per 24 hour  Intake 5323.34 ml  Output   2350 ml  Net 2973.34 ml     Labs:  Recent Labs  05/24/14 0440  HGB 10.7*    Recent Labs  05/24/14 0440  WBC 12.5*  RBC 4.06*  HCT 33.7*  PLT 302    Recent Labs  05/24/14 0440  NA 135  K 3.7  CL 99  CO2 29  BUN 15  CREATININE 0.79  GLUCOSE 123*  CALCIUM 8.3*    EXAM General - Patient is Alert and Oriented Extremity - Neurologically intact Intact pulses distally Dorsiflexion/Plantar flexion intact No cellulitis present Compartment soft Dressing - dressing C/D/I Motor Function - intact, moving foot and toes well on exam.  Hemovac pulled without difficulty.  Past Medical History  Diagnosis Date  . Hypertension   . Hypertriglyceridemia   . Numbness of fingers of both hands   . Arthritis   . Complication of anesthesia     slow to wake up    Assessment/Plan: 1 Day Post-Op Procedure(s) (LRB): RIGHT TOTAL KNEE ARTHROPLASTY (Right) Active Problems:   History of total knee arthroplasty  Estimated body mass index is 39.12 kg/(m^2) as calculated from the following:   Height as of  this encounter: 6\' 3"  (1.905 m).   Weight as of this encounter: 141.976 kg (313 lb). Advance diet Up with therapy D/C IV fluids when tolerating POs well  DVT Prophylaxis - Xarelto Weight-Bearing as tolerated D/C O2 and Pulse OX and try on Room Air  He is doing fair. No issues with hematoma in the knee as he had with the left TKA. Will do PT today. May adjust muscle relaxer depending on how he does. Plan for discharge home tomorrow.   Ardeen Jourdain, PA-C Orthopaedic Surgery 05/24/2014, 8:12 AM

## 2014-05-24 NOTE — Discharge Instructions (Addendum)
INSTRUCTIONS AFTER JOINT REPLACEMENT  ° °Remove items at home which could result in a fall. This includes throw rugs or furniture in walking pathways °ICE to the affected joint every three hours while awake for 30 minutes at a time, for at least the first 3-5 days, and then as needed for pain and swelling.  Continue to use ice for pain and swelling. You may notice swelling that will progress down to the foot and ankle.  This is normal after surgery.  Elevate your leg when you are not up walking on it.   °Continue to use the breathing machine you got in the hospital (incentive spirometer) which will help keep your temperature down.  It is common for your temperature to cycle up and down following surgery, especially at night when you are not up moving around and exerting yourself.  The breathing machine keeps your lungs expanded and your temperature down. ° ° °DIET:  As you were doing prior to hospitalization, we recommend a well-balanced diet. ° °DRESSING / WOUND CARE / SHOWERING ° °Keep the surgical dressing until follow up.  The dressing is water proof, so you can shower without any extra covering.  IF THE DRESSING FALLS OFF or the wound gets wet inside, change the dressing with sterile gauze.  Please use good hand washing techniques before changing the dressing.  Do not use any lotions or creams on the incision until instructed by your surgeon.   ° °ACTIVITY ° °Increase activity slowly as tolerated, but follow the weight bearing instructions below.   °No driving for 6 weeks or until further direction given by your physician.  You cannot drive while taking narcotics.  °No lifting or carrying greater than 10 lbs. until further directed by your surgeon. °Avoid periods of inactivity such as sitting longer than an hour when not asleep. This helps prevent blood clots.  °You may return to work once you are authorized by your doctor.  ° ° ° °WEIGHT BEARING  ° °Weight bearing as tolerated with assist device (walker, cane,  etc) as directed, use it as long as suggested by your surgeon or therapist, typically at least 4-6 weeks. ° ° °EXERCISES ° °Results after joint replacement surgery are often greatly improved when you follow the exercise, range of motion and muscle strengthening exercises prescribed by your doctor. Safety measures are also important to protect the joint from further injury. Any time any of these exercises cause you to have increased pain or swelling, decrease what you are doing until you are comfortable again and then slowly increase them. If you have problems or questions, call your caregiver or physical therapist for advice.  ° °Rehabilitation is important following a joint replacement. After just a few days of immobilization, the muscles of the leg can become weakened and shrink (atrophy).  These exercises are designed to build up the tone and strength of the thigh and leg muscles and to improve motion. Often times heat used for twenty to thirty minutes before working out will loosen up your tissues and help with improving the range of motion but do not use heat for the first two weeks following surgery (sometimes heat can increase post-operative swelling).  ° °These exercises can be done on a training (exercise) mat, on the floor, on a table or on a bed. Use whatever works the best and is most comfortable for you.    Use music or television while you are exercising so that the exercises are a pleasant break in your   day. This will make your life better with the exercises acting as a break in your routine that you can look forward to.   Perform all exercises about fifteen times, three times per day or as directed.  You should exercise both the operative leg and the other leg as well.   Exercises include:   Quad Sets - Tighten up the muscle on the front of the thigh (Quad) and hold for 5-10 seconds.   Straight Leg Raises - With your knee straight (if you were given a brace, keep it on), lift the leg to 60  degrees, hold for 3 seconds, and slowly lower the leg.  Perform this exercise against resistance later as your leg gets stronger.  Leg Slides: Lying on your back, slowly slide your foot toward your buttocks, bending your knee up off the floor (only go as far as is comfortable). Then slowly slide your foot back down until your leg is flat on the floor again.  Angel Wings: Lying on your back spread your legs to the side as far apart as you can without causing discomfort.  Hamstring Strength:  Lying on your back, push your heel against the floor with your leg straight by tightening up the muscles of your buttocks.  Repeat, but this time bend your knee to a comfortable angle, and push your heel against the floor.  You may put a pillow under the heel to make it more comfortable if necessary.   A rehabilitation program following joint replacement surgery can speed recovery and prevent re-injury in the future due to weakened muscles. Contact your doctor or a physical therapist for more information on knee rehabilitation.    CONSTIPATION  Constipation is defined medically as fewer than three stools per week and severe constipation as less than one stool per week.  Even if you have a regular bowel pattern at home, your normal regimen is likely to be disrupted due to multiple reasons following surgery.  Combination of anesthesia, postoperative narcotics, change in appetite and fluid intake all can affect your bowels.   YOU MUST use at least one of the following options; they are listed in order of increasing strength to get the job done.  They are all available over the counter, and you may need to use some, POSSIBLY even all of these options:    Drink plenty of fluids (prune juice may be helpful) and high fiber foods Colace 100 mg by mouth twice a day  Senokot for constipation as directed and as needed Dulcolax (bisacodyl), take with full glass of water  Miralax (polyethylene glycol) once or twice a day as  needed.  If you have tried all these things and are unable to have a bowel movement in the first 3-4 days after surgery call either your surgeon or your primary doctor.    If you experience loose stools or diarrhea, hold the medications until you stool forms back up.  If your symptoms do not get better within 1 week or if they get worse, check with your doctor.  If you experience "the worst abdominal pain ever" or develop nausea or vomiting, please contact the office immediately for further recommendations for treatment.   ITCHING:  If you experience itching with your medications, try taking only a single pain pill, or even half a pain pill at a time.  You can also use Benadryl over the counter for itching or also to help with sleep.   TED HOSE STOCKINGS:  Use stockings on  both legs until for at least 2 weeks or as directed by physician office. They may be removed at night for sleeping.  MEDICATIONS:  See your medication summary on the After Visit Summary that nursing will review with you.  You may have some home medications which will be placed on hold until you complete the course of blood thinner medication.  It is important for you to complete the blood thinner medication as prescribed.  PRECAUTIONS:  If you experience chest pain or shortness of breath - call 911 immediately for transfer to the hospital emergency department.   If you develop a fever greater that 101 F, purulent drainage from wound, increased redness or drainage from wound, foul odor from the wound/dressing, or calf pain - CONTACT YOUR SURGEON.                                                   FOLLOW-UP APPOINTMENTS:  If you do not already have a post-op appointment, please call the office for an appointment to be seen by your surgeon.  Guidelines for how soon to be seen are listed in your After Visit Summary, but are typically between 1-4 weeks after surgery.  OTHER INSTRUCTIONS:  Do NOT take the diazepam (Valium) and  methocarbamol (Robaxin) simultaneously. Alternate every 4-6 hours. Reduce to only the Robaxin once the muscle spasms are under control.    MAKE SURE YOU:  Understand these instructions.  Get help right away if you are not doing well or get worse.    Thank you for letting us be a part of your medical care team.  It is a privilege we respect greatly.  We hope these instructions will help you stay on track for a fast and full recovery!  Information on my medicine - XARELTO (Rivaroxaban)  This medication education was reviewed with me or my healthcare representative as part of my discharge preparation.  The pharmacist that spoke with me during my hospital stay was:  Angela [facility]  Why was Xarelto prescribed for you? Xarelto was prescribed for you to reduce the risk of blood clots forming after orthopedic surgery. The medical term for these abnormal blood clots is venous thromboembolism (VTE).  What do you need to know about xarelto ? Take your Xarelto ONCE DAILY at the same time every day. You may take it either with or without food.  If you have difficulty swallowing the tablet whole, you may crush it and mix in applesauce just prior to taking your dose.  Take Xarelto exactly as prescribed by your doctor and DO NOT stop taking Xarelto without talking to the doctor who prescribed the medication.  Stopping without other VTE prevention medication to take the place of Xarelto may increase your risk of developing a clot.  After discharge, you should have regular check-up appointments with your healthcare provider that is prescribing your Xarelto.    What do you do if you miss a dose? If you miss a dose, take it as soon as you remember on the same day then continue your regularly scheduled once daily regimen the next day. Do not take two doses of Xarelto on the same day.   Important Safety Information A possible side effect of Xarelto is bleeding. You should call your  healthcare provider right away if you experience any of the following: ?  Bleeding from an injury or your nose that does not stop. ? Unusual colored urine (red or dark brown) or unusual colored stools (red or black). ? Unusual bruising for unknown reasons. ? A serious fall or if you hit your head (even if there is no bleeding).  Some medicines may interact with Xarelto and might increase your risk of bleeding while on Xarelto. To help avoid this, consult your healthcare provider or pharmacist prior to using any new prescription or non-prescription medications, including herbals, vitamins, non-steroidal anti-inflammatory drugs (NSAIDs) and supplements.  This website has more information on Xarelto: [email or web address]

## 2014-05-24 NOTE — Progress Notes (Signed)
Physical Therapy Treatment Patient Details Name: Andrew Haley MRN: [id number] DOB: 27-Nov-1965 Today's Date: 05/24/2014    History of Present Illness [name]    PT Comments    C/o spasms, not tolerating ROM very well, does tolerate ambulation.   Follow Up Recommendations  Home health PT;Supervision/Assistance - 24 hour     Equipment Recommendations  None recommended by PT    Recommendations for Other Services       Precautions / Restrictions Precautions Precautions: Knee;Fall Required Braces or Orthoses: Knee Immobilizer - Right Knee Immobilizer - Right: Discontinue once straight leg raise with < 10 degree lag    Mobility  Bed Mobility   Bed Mobility: Supine to Sit;Sit to Supine     Supine to sit: Min assist Sit to supine: Min assist   General bed mobility comments: R leg support, use of rail  Transfers Overall transfer level: Needs assistance Equipment used: Rolling walker (2 wheeled) Transfers: Sit to/from Stand Sit to Stand: Min assist;From elevated surface         General transfer comment: cues for sequence , hand placement  Ambulation/Gait Ambulation/Gait assistance: Min assist Ambulation Distance (Feet): 125 Feet Assistive device: Rolling walker (2 wheeled) Gait Pattern/deviations: Step-to pattern;Antalgic     General Gait Details: cues for sequence and  posture, decreased weight on R leg.   Stairs            Wheelchair Mobility    Modified Rankin (Stroke Patients Only)       Balance                                    Cognition Arousal/Alertness: Awake/alert Behavior During Therapy: Anxious                        Exercises Total Joint Exercises Ankle Circles/Pumps: AROM;Both;10 reps Quad Sets: AROM;Both;10 reps;Supine Heel Slides: AAROM;Right;10 reps;Supine Straight Leg Raises: AAROM;Right;10 reps;Supine Goniometric ROM: 15-35 knee flexion, very guarded    General Comments        Pertinent  Vitals/Pain Pain Score: 9  Pain Location: R knee, spasms in back of Knee Pain Descriptors / Indicators: Pressure;Spasm;Heaviness Pain Intervention(s): Limited activity within patient's tolerance;Monitored during session;Premedicated before session;Repositioned;Ice applied    Home Living                      Prior Function            PT Goals (current goals can now be found in the care plan section) Progress towards PT goals: Progressing toward goals    Frequency  7X/week    PT Plan Current plan remains appropriate    Co-evaluation             End of Session   Activity Tolerance: Patient limited by pain Patient left: in bed;with call bell/phone within reach     Time: 1440-1502 PT Time Calculation (min) (ACUTE ONLY): 22 min  Charges:  $Gait Training: 8-22 mins                    G Codes:      Claretha Cooper 05/24/2014, 5:50 PM Tresa Endo PT [phone]

## 2014-05-25 DIAGNOSIS — D62 Acute posthemorrhagic anemia: Secondary | ICD-10-CM | POA: Diagnosis not present

## 2014-05-25 LAB — URINALYSIS, ROUTINE W REFLEX MICROSCOPIC
Bilirubin Urine: NEGATIVE
Glucose, UA: NEGATIVE mg/dL
Hgb urine dipstick: NEGATIVE
Ketones, ur: NEGATIVE mg/dL
Leukocytes, UA: NEGATIVE
Nitrite: NEGATIVE
Protein, ur: NEGATIVE mg/dL
Specific Gravity, Urine: 1.026 (ref 1.005–1.030)
Urobilinogen, UA: 1 mg/dL (ref 0.0–1.0)
pH: 5.5 (ref 5.0–8.0)

## 2014-05-25 LAB — CBC
HEMATOCRIT: 30.3 % — AB (ref 39.0–52.0)
HEMOGLOBIN: 9.7 g/dL — AB (ref 13.0–17.0)
MCH: 26.5 pg (ref 26.0–34.0)
MCHC: 32 g/dL (ref 30.0–36.0)
MCV: 82.8 fL (ref 78.0–100.0)
Platelets: 186 10*3/uL (ref 150–400)
RBC: 3.66 MIL/uL — ABNORMAL LOW (ref 4.22–5.81)
RDW: 13.7 % (ref 11.5–15.5)
WBC: 9.4 10*3/uL (ref 4.0–10.5)

## 2014-05-25 LAB — BASIC METABOLIC PANEL
ANION GAP: 6 (ref 5–15)
BUN: 15 mg/dL (ref 6–23)
CALCIUM: 8.3 mg/dL — AB (ref 8.4–10.5)
CO2: 32 mmol/L (ref 19–32)
Chloride: 97 mmol/L (ref 96–112)
Creatinine, Ser: 0.75 mg/dL (ref 0.50–1.35)
GFR calc Af Amer: >90 mL/min (ref >90)
GFR calc non Af Amer: >90 mL/min (ref >90)
Glucose, Bld: 123 mg/dL — ABNORMAL HIGH (ref 70–99)
POTASSIUM: 3.6 mmol/L (ref 3.5–5.1)
Sodium: 135 mmol/L (ref 135–145)

## 2014-05-25 MED ORDER — DIAZEPAM 2 MG PO TABS: 2.0000 mg | ORAL_TABLET | Freq: Four times a day (QID) | ORAL | Status: DC | PRN

## 2014-05-25 MED ORDER — FERROUS SULFATE 325 (65 FE) MG PO TABS: 325.0000 mg | ORAL_TABLET | Freq: Three times a day (TID) | ORAL | Status: DC

## 2014-05-25 MED ORDER — TAMSULOSIN HCL 0.4 MG PO CAPS: 0.4000 mg | ORAL_CAPSULE | Freq: Every day | ORAL | Status: DC

## 2014-05-25 MED ORDER — SODIUM CHLORIDE 0.9 % IV BOLUS (SEPSIS)
250.0000 mL | Freq: Once | INTRAVENOUS | Status: AC
  Administered 2014-05-25: 250 mL via INTRAVENOUS

## 2014-05-25 MED ORDER — TAMSULOSIN HCL 0.4 MG PO CAPS
0.4000 mg | ORAL_CAPSULE | Freq: Every day | ORAL | Status: DC
  Administered 2014-05-25: 0.4 mg via ORAL
  Filled 2014-05-25: qty 1

## 2014-05-25 MED ORDER — OXYCODONE HCL 10 MG PO TABS: 10.0000 mg | ORAL_TABLET | ORAL | Status: DC | PRN

## 2014-05-25 MED ORDER — METHOCARBAMOL 500 MG PO TABS: 500.0000 mg | ORAL_TABLET | Freq: Four times a day (QID) | ORAL | Status: DC | PRN

## 2014-05-25 MED ORDER — RIVAROXABAN 10 MG PO TABS: 10.0000 mg | ORAL_TABLET | Freq: Every day | ORAL | Status: DC

## 2014-05-25 NOTE — Care Management Note (Signed)
    Page 1 of 1   05/25/2014     12:22:43 PM CARE MANAGEMENT NOTE 05/25/2014  Patient:  Andrew Haley, Andrew Haley   Account Number:  [id number]  Date Initiated:  05/25/2014  Documentation initiated by:  Sunday Spillers  Subjective/Objective Assessment:   49 yo male admitted s/p Right total knee arthroplasty     Action/Plan:   Discharge planning   Anticipated DC Date:  05/25/2014   Anticipated DC Plan:  [place]  CM consult      [facility] Choice  HOME HEALTH   Choice offered to / List presented to:  C-1 Patient        [place] arranged  HH-2 PT      [place].   Status of service:  Completed, signed off Medicare Important Message given?   (If response is "NO", the following Medicare IM given date fields will be blank) Date Medicare IM given:   Medicare IM given by:   Date Additional Medicare IM given:   Additional Medicare IM given by:    Discharge Disposition:  [place]  Per UR Regulation:  Reviewed for med. necessity/level of care/duration of stay  If discussed at [place] of Stay Meetings, dates discussed:    Comments:  42 Spoke with patient at bedside. Offered choice for [facility], wants [facility]. Contacted with [facility] for referral. Has all DME needed.

## 2014-05-25 NOTE — Progress Notes (Signed)
Physical Therapy Treatment Patient Details Name: Andrew Haley MRN: [id number] DOB: 1966/01/12 Today's Date: 05/25/2014    History of Present Illness R TKR    PT Comments    POD # 2 AM session; pt in chair; C/o of a lot pain; placed immobilizer on   pt stood up ambulate 80 ft; pt required cues for remaining close to walker and sequence for gait (due to taking longer steps at times); while ambulating rest x3-4 due pt c/o pain; returned back to room; pt into bed; activity limited due to pain; provided copy of exercises to pt; applied ice to Knee; advised RN pt needing pain meds.   Follow Up Recommendations  Home health PT;Supervision/Assistance - 24 hour     Equipment Recommendations  None recommended by PT    Recommendations for Other Services       Precautions / Restrictions Precautions Precautions: Knee;Fall Required Braces or Orthoses: Knee Immobilizer - Right Restrictions Weight Bearing Restrictions: No    Mobility  Bed Mobility Overal bed mobility: Needs Assistance Bed Mobility: Sit to Supine       Sit to supine: Min assist   General bed mobility comments: Pt required R leg rupport back to bed  Transfers Overall transfer level: Needs assistance Equipment used: Rolling walker (2 wheeled) Transfers: Sit to/from Stand Sit to Stand: Min assist         General transfer comment: cues for sequence , hand placement, use of Blue assistive strap for R Leg  Ambulation/Gait Ambulation/Gait assistance: Min assist Ambulation Distance (Feet): 80 Feet Assistive device: Rolling walker (2 wheeled) Gait Pattern/deviations: Step-through pattern     General Gait Details: Cues for sequence and posture; positioning within walker; pt would rest periodically while ambulating;    Stairs            Wheelchair Mobility    Modified Rankin (Stroke Patients Only)       Balance                                    Cognition Arousal/Alertness:  Awake/alert Behavior During Therapy: WFL for tasks assessed/performed Overall Cognitive Status: Within Functional Limits for tasks assessed                      Exercises Total Joint Exercises Ankle Circles/Pumps: AROM;Both;20 reps;Supine Quad Sets: AROM;Right;10 reps;Supine Towel Squeeze: AROM;Both;10 reps Heel Slides: AAROM;Right;Supine;5 reps (Pt in C/O extreme pain ) Hip ABduction/ADduction: Right;Supine;5 reps;AROM Straight Leg Raises: AAROM;Right;10 reps;Supine    General Comments        Pertinent Vitals/Pain Pain Assessment: 0-10 Pain Score: 8  Pain Location: R Knee  Pain Descriptors / Indicators: Aching;Grimacing;Constant;Cramping Pain Intervention(s): Limited activity within patient's tolerance;Monitored during session;Premedicated before session;Repositioned;Ice applied;Patient requesting pain meds-RN notified    Home Living                      Prior Function            PT Goals (current goals can now be found in the care plan section) Progress towards PT goals: Progressing toward goals    Frequency  7X/week    PT Plan      Co-evaluation             End of Session Equipment Utilized During Treatment: Right knee immobilizer;Gait belt Activity Tolerance: Patient limited by pain Patient left: in bed;with call bell/phone within  reach     Time:   1035-1100    Charges:                       G Codes:      [place] PTA 05/25/2014, 1:27 PM  Reviewed above  Rica Koyanagi  PTA [facility]  Acute  Rehab Pager      [phone]

## 2014-05-25 NOTE — Progress Notes (Signed)
   Subjective: 2 Days Post-Op Procedure(s) (LRB): RIGHT TOTAL KNEE ARTHROPLASTY (Right) Patient reports pain as moderate.   Patient seen in rounds for Dr. Gladstone Lighter. Patient is well, but has had some minor complaints of urinary retention and dysuria. No SOB or chest pain. Positive flatus. No BM yet. Reports that he has some pain but much more under control that previously with the left TKA.  Plan is to go Home after hospital stay.  Objective: Vital signs in last 24 hours: Temp:  [97.7 F (36.5 C)-98.8 F (37.1 C)] 98.4 F (36.9 C) (04/14 0605) Pulse Rate:  [88-100] 100 (04/14 0605) Resp:  [16-18] 18 (04/14 0605) BP: (125-145)/(62-79) 144/76 mmHg (04/14 0605) SpO2:  [94 %-99 %] 96 % (04/14 0605)  Intake/Output from previous day:  Intake/Output Summary (Last 24 hours) at 05/25/14 0734 Last data filed at 05/25/14 0605  Gross per 24 hour  Intake 568.33 ml  Output   1975 ml  Net -1406.67 ml     Labs:  Recent Labs  05/24/14 0440 05/25/14 0520  HGB 10.7* 9.7*    Recent Labs  05/24/14 0440 05/25/14 0520  WBC 12.5* 9.4  RBC 4.06* 3.66*  HCT 33.7* 30.3*  PLT 302 186    Recent Labs  05/24/14 0440 05/25/14 0520  NA 135 135  K 3.7 3.6  CL 99 97  CO2 29 32  BUN 15 15  CREATININE 0.79 0.75  GLUCOSE 123* 123*  CALCIUM 8.3* 8.3*    EXAM General - Patient is Alert and Oriented Extremity - Neurologically intact Intact pulses distally Dorsiflexion/Plantar flexion intact No cellulitis present Compartment soft Dressing/Incision - clean, dry, no drainage Motor Function - intact, moving foot and toes well on exam.   Past Medical History  Diagnosis Date  . Hypertension   . Hypertriglyceridemia   . Numbness of fingers of both hands   . Arthritis   . Complication of anesthesia     slow to wake up    Assessment/Plan: 2 Days Post-Op Procedure(s) (LRB): RIGHT TOTAL KNEE ARTHROPLASTY (Right) Active Problems:   History of total knee arthroplasty   Postoperative  anemia due to acute blood loss  Estimated body mass index is 39.12 kg/(m^2) as calculated from the following:   Height as of this encounter: 6\' 3"  (1.905 m).   Weight as of this encounter: 141.976 kg (313 lb). Advance diet Up with therapy Discharge home with home health if urinary symptoms relieved  DVT Prophylaxis - Xarelto Weight-Bearing as tolerated   Will continue PT today. Will check UA for UTI. Will add Flomax to encourage urinary outflow. DC home if urinary symptoms relieved.   Ardeen Jourdain, PA-C Orthopaedic Surgery 05/25/2014, 7:34 AM

## 2014-05-25 NOTE — Progress Notes (Signed)
Physical Therapy Treatment Patient Details Name: Andrew Haley MRN: [id number] DOB: [birth year] Today's Date: 05/25/2014    History of Present Illness [name]    PT Comments    POD # 2 pm session.  Pt given Dilaudid pain meds which controlled pain much better to where pt could complete his TE's and amb in hallway with decreased pain.  Pt ready for D/C to home.    Follow Up Recommendations  Home health PT;Supervision/Assistance - 24 hour     Equipment Recommendations  None recommended by PT (has all from prior TKR)    Recommendations for Other Services       Precautions / Restrictions Precautions Precautions: Knee;Fall Precaution Comments: instructed pt on KI use for amb  Required Braces or Orthoses: Knee Immobilizer - Right Knee Immobilizer - Right: Discontinue once straight leg raise with < 10 degree lag Restrictions Weight Bearing Restrictions: No    Mobility  Bed Mobility Overal bed mobility: Needs Assistance Bed Mobility: Supine to Sit;Sit to Supine     Supine to sit: Min guard Sit to supine: Min guard   General bed mobility comments: pt uses momentum and leg lifter to get self out/in bed.  Transfers Overall transfer level: Needs assistance Equipment used: Rolling walker (2 wheeled) Transfers: Sit to/from Stand Sit to Stand: Min guard         General transfer comment: 25% VC's on proper hand placement and uses momentum to self rise  Ambulation/Gait Ambulation/Gait assistance: Supervision;Min guard Ambulation Distance (Feet): 80 Feet Assistive device: Rolling walker (2 wheeled) Gait Pattern/deviations: Step-through pattern Gait velocity: decreased   General Gait Details: increased ability and tolerance.   5/10 pain vs 8/10 pain this am.   Stairs            Wheelchair Mobility    Modified Rankin (Stroke Patients Only)       Balance                                    Cognition Arousal/Alertness: Awake/alert Behavior During  Therapy: WFL for tasks assessed/performed Overall Cognitive Status: Within Functional Limits for tasks assessed                      Exercises Total Joint Exercises Ankle Circles/Pumps: AROM;Both;20 reps;Supine Quad Sets: AROM;Right;10 reps;Supine Towel Squeeze: AROM;Both;10 reps Heel Slides: AAROM;Right;Supine;5 reps (Pt in C/O extreme pain ) Hip ABduction/ADduction: Right;Supine;5 reps;AROM Straight Leg Raises: AAROM;Right;10 reps;Supine    General Comments        Pertinent Vitals/Pain Pain Assessment: 0-10 Pain Score: 5  Pain Location: R knee Pain Descriptors / Indicators: Aching;Sore Pain Intervention(s): Monitored during session;Premedicated before session;Repositioned;Ice applied    Home Living                      Prior Function            PT Goals (current goals can now be found in the care plan section) Progress towards PT goals: Progressing toward goals    Frequency  7X/week    PT Plan      Co-evaluation             End of Session Equipment Utilized During Treatment: Right knee immobilizer;Gait belt Activity Tolerance: Patient tolerated treatment well Patient left: in bed;with call bell/phone within reach     Time: 1400-1425 PT Time Calculation (min) (ACUTE ONLY): 25 min  Charges:  $Gait Training: 8-22 mins $Therapeutic Activity: 8-22 mins                    G Codes:      Rica Koyanagi  PTA [facility]  Acute  Rehab Pager      [phone]

## 2014-05-26 DIAGNOSIS — Z96651 Presence of right artificial knee joint: Secondary | ICD-10-CM | POA: Diagnosis not present

## 2014-05-26 DIAGNOSIS — Z471 Aftercare following joint replacement surgery: Secondary | ICD-10-CM | POA: Diagnosis not present

## 2014-05-29 DIAGNOSIS — Z96651 Presence of right artificial knee joint: Secondary | ICD-10-CM | POA: Diagnosis not present

## 2014-05-29 DIAGNOSIS — Z471 Aftercare following joint replacement surgery: Secondary | ICD-10-CM | POA: Diagnosis not present

## 2014-05-29 NOTE — Discharge Summary (Signed)
Physician Discharge Summary   Patient ID: Andrew Haley MRN: [id number] DOB/AGE: Oct 04, 1965 49 y.o.  Admit date: 05/23/2014 Discharge date: 05/25/2014  Primary Diagnosis: Primary osteoarthritis, right knee  Admission Diagnoses:  Past Medical History  Diagnosis Date  . Hypertension   . Hypertriglyceridemia   . Numbness of fingers of both hands   . Arthritis   . Complication of anesthesia     slow to wake up   Discharge Diagnoses:   Active Problems:   History of total knee arthroplasty   Postoperative anemia due to acute blood loss  Estimated body mass index is 39.12 kg/(m^2) as calculated from the following:   Height as of this encounter: $RemoveBeforeD'6\' 3"'[id number]$  (1.905 m).   Weight as of this encounter: 141.976 kg (313 lb).  Procedure:  Procedure(s) (LRB): RIGHT TOTAL KNEE ARTHROPLASTY (Right)   Consults: None  HPI: Andrew Haley, 49 y.o. male, has a history of pain and functional disability in the right knee due to arthritis and has failed non-surgical conservative treatments for greater than 12 weeks to includeNSAID's and/or analgesics, corticosteriod injections and activity modification. Onset of symptoms was gradual, starting 8 years ago with gradually worsening course since that time. The patient noted prior procedures on the knee to include arthroscopy and menisectomy on the right knee(s). Patient currently rates pain in the right knee(s) at 7 out of 10 with activity. Patient has night pain, worsening of pain with activity and weight bearing, pain that interferes with activities of daily living, pain with passive range of motion, crepitus and joint swelling. Patient has evidence of periarticular osteophytes and joint space narrowing by imaging studies. There is no active infection.  Laboratory Data: Admission on 05/23/2014, Discharged on 05/25/2014  Component Date Value Ref Range Status  . ABO/RH(D) 05/23/2014 A POS   Final  . Antibody Screen 05/23/2014 NEG   Final  . Sample  Expiration 05/23/2014 05/26/2014   Final  . WBC 05/24/2014 12.5* 4.0 - 10.5 K/uL Final  . RBC 05/24/2014 4.06* 4.22 - 5.81 MIL/uL Final  . Hemoglobin 05/24/2014 10.7* 13.0 - 17.0 g/dL Final  . HCT 05/24/2014 33.7* 39.0 - 52.0 % Final  . MCV 05/24/2014 83.0  78.0 - 100.0 fL Final  . MCH 05/24/2014 26.4  26.0 - 34.0 pg Final  . MCHC 05/24/2014 31.8  30.0 - 36.0 g/dL Final  . RDW 05/24/2014 13.5  11.5 - 15.5 % Final  . Platelets 05/24/2014 302  150 - 400 K/uL Final  . Sodium 05/24/2014 135  135 - 145 mmol/L Final  . Potassium 05/24/2014 3.7  3.5 - 5.1 mmol/L Final  . Chloride 05/24/2014 99  96 - 112 mmol/L Final  . CO2 05/24/2014 29  19 - 32 mmol/L Final  . Glucose, Bld 05/24/2014 123* 70 - 99 mg/dL Final  . BUN 05/24/2014 15  6 - 23 mg/dL Final  . Creatinine, Ser 05/24/2014 0.79  0.50 - 1.35 mg/dL Final  . Calcium 05/24/2014 8.3* 8.4 - 10.5 mg/dL Final  . GFR calc non Af Amer 05/24/2014 >90  >90 mL/min Final  . GFR calc Af Amer 05/24/2014 >90  >90 mL/min Final   Comment: (NOTE) The eGFR has been calculated using the CKD EPI equation. This calculation has not been validated in all clinical situations. eGFR's persistently <90 mL/min signify possible Chronic Kidney Disease.   . Anion gap 05/24/2014 7  5 - 15 Final  . WBC 05/25/2014 9.4  4.0 - 10.5 K/uL Final  . RBC 05/25/2014 3.66* 4.22 -  5.81 MIL/uL Final  . Hemoglobin 05/25/2014 9.7* 13.0 - 17.0 g/dL Final  . HCT 05/25/2014 30.3* 39.0 - 52.0 % Final  . MCV 05/25/2014 82.8  78.0 - 100.0 fL Final  . MCH 05/25/2014 26.5  26.0 - 34.0 pg Final  . MCHC 05/25/2014 32.0  30.0 - 36.0 g/dL Final  . RDW 05/25/2014 13.7  11.5 - 15.5 % Final  . Platelets 05/25/2014 186  150 - 400 K/uL Final   Comment: SPECIMEN CHECKED FOR CLOTS REPEATED TO VERIFY DELTA CHECK NOTED PLATELET COUNT CONFIRMED BY SMEAR   . Sodium 05/25/2014 135  135 - 145 mmol/L Final  . Potassium 05/25/2014 3.6  3.5 - 5.1 mmol/L Final  . Chloride 05/25/2014 97  96 - 112 mmol/L  Final  . CO2 05/25/2014 32  19 - 32 mmol/L Final  . Glucose, Bld 05/25/2014 123* 70 - 99 mg/dL Final  . BUN 05/25/2014 15  6 - 23 mg/dL Final  . Creatinine, Ser 05/25/2014 0.75  0.50 - 1.35 mg/dL Final  . Calcium 05/25/2014 8.3* 8.4 - 10.5 mg/dL Final  . GFR calc non Af Amer 05/25/2014 >90  >90 mL/min Final  . GFR calc Af Amer 05/25/2014 >90  >90 mL/min Final   Comment: (NOTE) The eGFR has been calculated using the CKD EPI equation. This calculation has not been validated in all clinical situations. eGFR's persistently <90 mL/min signify possible Chronic Kidney Disease.   . Anion gap 05/25/2014 6  5 - 15 Final  . Color, Urine 05/25/2014 Andrew Haley* YELLOW Final   BIOCHEMICALS MAY BE AFFECTED BY COLOR  . APPearance 05/25/2014 CLEAR  CLEAR Final  . Specific Gravity, Urine 05/25/2014 1.026  1.005 - 1.030 Final  . pH 05/25/2014 5.5  5.0 - 8.0 Final  . Glucose, UA 05/25/2014 NEGATIVE  NEGATIVE mg/dL Final  . Hgb urine dipstick 05/25/2014 NEGATIVE  NEGATIVE Final  . Bilirubin Urine 05/25/2014 NEGATIVE  NEGATIVE Final  . Ketones, ur 05/25/2014 NEGATIVE  NEGATIVE mg/dL Final  . Protein, ur 05/25/2014 NEGATIVE  NEGATIVE mg/dL Final  . Urobilinogen, UA 05/25/2014 1.0  0.0 - 1.0 mg/dL Final  . Nitrite 05/25/2014 NEGATIVE  NEGATIVE Final  . Leukocytes, UA 05/25/2014 NEGATIVE  NEGATIVE Final   MICROSCOPIC NOT DONE ON URINES WITH NEGATIVE PROTEIN, BLOOD, LEUKOCYTES, NITRITE, OR GLUCOSE <1000 mg/dL.  Hospital Outpatient Visit on 05/16/2014  Component Date Value Ref Range Status  . aPTT 05/16/2014 29  24 - 37 seconds Final  . WBC 05/16/2014 6.8  4.0 - 10.5 K/uL Final  . RBC 05/16/2014 4.56  4.22 - 5.81 MIL/uL Final  . Hemoglobin 05/16/2014 11.9* 13.0 - 17.0 g/dL Final  . HCT 05/16/2014 37.9* 39.0 - 52.0 % Final  . MCV 05/16/2014 83.1  78.0 - 100.0 fL Final  . MCH 05/16/2014 26.1  26.0 - 34.0 pg Final  . MCHC 05/16/2014 31.4  30.0 - 36.0 g/dL Final  . RDW 05/16/2014 13.4  11.5 - 15.5 % Final  .  Platelets 05/16/2014 283  150 - 400 K/uL Final  . Neutrophils Relative % 05/16/2014 66  43 - 77 % Final  . Neutro Abs 05/16/2014 4.5  1.7 - 7.7 K/uL Final  . Lymphocytes Relative 05/16/2014 21  12 - 46 % Final  . Lymphs Abs 05/16/2014 1.5  0.7 - 4.0 K/uL Final  . Monocytes Relative 05/16/2014 6  3 - 12 % Final  . Monocytes Absolute 05/16/2014 0.4  0.1 - 1.0 K/uL Final  . Eosinophils Relative 05/16/2014 7* 0 -  5 % Final  . Eosinophils Absolute 05/16/2014 0.4  0.0 - 0.7 K/uL Final  . Basophils Relative 05/16/2014 0  0 - 1 % Final  . Basophils Absolute 05/16/2014 0.0  0.0 - 0.1 K/uL Final  . Sodium 05/16/2014 140  135 - 145 mmol/L Final  . Potassium 05/16/2014 3.8  3.5 - 5.1 mmol/L Final  . Chloride 05/16/2014 103  96 - 112 mmol/L Final  . CO2 05/16/2014 25  19 - 32 mmol/L Final  . Glucose, Bld 05/16/2014 107* 70 - 99 mg/dL Final  . BUN 05/16/2014 19  6 - 23 mg/dL Final  . Creatinine, Ser 05/16/2014 1.14  0.50 - 1.35 mg/dL Final  . Calcium 05/16/2014 8.5  8.4 - 10.5 mg/dL Final  . Total Protein 05/16/2014 7.0  6.0 - 8.3 g/dL Final  . Albumin 05/16/2014 3.8  3.5 - 5.2 g/dL Final  . AST 05/16/2014 20  0 - 37 U/L Final  . ALT 05/16/2014 18  0 - 53 U/L Final  . Alkaline Phosphatase 05/16/2014 63  39 - 117 U/L Final  . Total Bilirubin 05/16/2014 0.3  0.3 - 1.2 mg/dL Final  . GFR calc non Af Amer 05/16/2014 73* >90 mL/min Final  . GFR calc Af Amer 05/16/2014 85* >90 mL/min Final   Comment: (NOTE) The eGFR has been calculated using the CKD EPI equation. This calculation has not been validated in all clinical situations. eGFR's persistently <90 mL/min signify possible Chronic Kidney Disease.   . Anion gap 05/16/2014 12  5 - 15 Final  . Prothrombin Time 05/16/2014 13.0  11.6 - 15.2 seconds Final  . INR 05/16/2014 0.98  0.00 - 1.49 Final  . Color, Urine 05/16/2014 YELLOW  YELLOW Final  . APPearance 05/16/2014 CLEAR  CLEAR Final  . Specific Gravity, Urine 05/16/2014 1.024  1.005 - 1.030 Final   . pH 05/16/2014 5.5  5.0 - 8.0 Final  . Glucose, UA 05/16/2014 NEGATIVE  NEGATIVE mg/dL Final  . Hgb urine dipstick 05/16/2014 NEGATIVE  NEGATIVE Final  . Bilirubin Urine 05/16/2014 NEGATIVE  NEGATIVE Final  . Ketones, ur 05/16/2014 NEGATIVE  NEGATIVE mg/dL Final  . Protein, ur 05/16/2014 NEGATIVE  NEGATIVE mg/dL Final  . Urobilinogen, UA 05/16/2014 1.0  0.0 - 1.0 mg/dL Final  . Nitrite 05/16/2014 NEGATIVE  NEGATIVE Final  . Leukocytes, UA 05/16/2014 NEGATIVE  NEGATIVE Final   MICROSCOPIC NOT DONE ON URINES WITH NEGATIVE PROTEIN, BLOOD, LEUKOCYTES, NITRITE, OR GLUCOSE <1000 mg/dL.  Marland Kitchen MRSA, PCR 05/16/2014 NEGATIVE  NEGATIVE Final  . Staphylococcus aureus 05/16/2014 POSITIVE* NEGATIVE Final   Comment:        The Xpert SA Assay (FDA approved for NASAL specimens in patients over 43 years of age), is one component of a comprehensive surveillance program.  Test performance has been validated by [facility] for patients greater than or equal to 84 year old. It is not intended to diagnose infection nor to guide or monitor treatment.      X-Rays:No results found.   Hospital Course: Andrew Haley is a 49 y.o. who was admitted to [facility]. They were brought to the operating room on 05/23/2014 and underwent Procedure(s): RIGHT TOTAL KNEE ARTHROPLASTY.  Patient tolerated the procedure well and was later transferred to the recovery room and then to the orthopaedic floor for postoperative care.  They were given PO and IV analgesics for pain control following their surgery.  They were given 24 hours of postoperative antibiotics of  Anti-infectives    Start  Dose/Rate Route Frequency Ordered Stop   05/23/14 1830  ceFAZolin (ANCEF) IVPB 1 g/50 mL premix     1 g 100 mL/hr over 30 Minutes Intravenous Every 6 hours 05/23/14 1554 05/24/14 0100   05/23/14 1415  polymyxin B 500,000 Units, bacitracin 50,000 Units in sodium chloride irrigation 0.9 % 500 mL irrigation  Status:  Discontinued        As needed 05/23/14 1415 05/23/14 1507   05/23/14 0600  ceFAZolin (ANCEF) 3 g in dextrose 5 % 50 mL IVPB  Status:  Discontinued     3 g 160 mL/hr over 30 Minutes Intravenous On call to O.R. 05/22/14 1318 05/23/14 1553     and started on DVT prophylaxis in the form of Xarelto.   PT and OT were ordered for total joint protocol.  Discharge planning consulted to help with postop disposition and equipment needs.  Patient had a good night on the evening of surgery.  They started to get up OOB with therapy on day one. Hemovac drain was pulled without difficulty.  He had some issues with muscle spasms but saw improvement with Valium. Continued to work with therapy into day two.  The patient had progressed with therapy and meeting their goals.  Incision was healing well.  Patient was seen in rounds and was ready to go home.   Diet: Cardiac diet Activity:WBAT Follow-up:in 2 weeks Disposition - Home Discharged Condition: stable   Discharge Instructions    Call MD / Call 911    Complete by:  As directed   If you experience chest pain or shortness of breath, CALL 911 and be transported to the hospital emergency room.  If you develope a fever above 101 F, pus (white drainage) or increased drainage or redness at the wound, or calf pain, call your surgeon's office.     Constipation Prevention    Complete by:  As directed   Drink plenty of fluids.  Prune juice may be helpful.  You may use a stool softener, such as Colace (over the counter) 100 mg twice a day.  Use MiraLax (over the counter) for constipation as needed.     Diet - low sodium heart healthy    Complete by:  As directed      Discharge instructions    Complete by:  As directed   INSTRUCTIONS AFTER JOINT REPLACEMENT   Remove items at home which could result in a fall. This includes throw rugs or furniture in walking pathways ICE to the affected joint every three hours while awake for 30 minutes at a time, for at least the first 3-5 days,  and then as needed for pain and swelling.  Continue to use ice for pain and swelling. You may notice swelling that will progress down to the foot and ankle.  This is normal after surgery.  Elevate your leg when you are not up walking on it.   Continue to use the breathing machine you got in the hospital (incentive spirometer) which will help keep your temperature down.  It is common for your temperature to cycle up and down following surgery, especially at night when you are not up moving around and exerting yourself.  The breathing machine keeps your lungs expanded and your temperature down.   DIET:  As you were doing prior to hospitalization, we recommend a well-balanced diet.  DRESSING / WOUND CARE / SHOWERING  Keep the surgical dressing until follow up.  The dressing is water proof, so you can shower  without any extra covering.  IF THE DRESSING FALLS OFF or the wound gets wet inside, change the dressing with sterile gauze.  Please use good hand washing techniques before changing the dressing.  Do not use any lotions or creams on the incision until instructed by your surgeon.    ACTIVITY  Increase activity slowly as tolerated, but follow the weight bearing instructions below.   No driving for 6 weeks or until further direction given by your physician.  You cannot drive while taking narcotics.  No lifting or carrying greater than 10 lbs. until further directed by your surgeon. Avoid periods of inactivity such as sitting longer than an hour when not asleep. This helps prevent blood clots.  You may return to work once you are authorized by your doctor.     WEIGHT BEARING   Weight bearing as tolerated with assist device (walker, cane, etc) as directed, use it as long as suggested by your surgeon or therapist, typically at least 4-6 weeks.   EXERCISES  Results after joint replacement surgery are often greatly improved when you follow the exercise, range of motion and muscle strengthening  exercises prescribed by your doctor. Safety measures are also important to protect the joint from further injury. Any time any of these exercises cause you to have increased pain or swelling, decrease what you are doing until you are comfortable again and then slowly increase them. If you have problems or questions, call your caregiver or physical therapist for advice.   Rehabilitation is important following a joint replacement. After just a few days of immobilization, the muscles of the leg can become weakened and shrink (atrophy).  These exercises are designed to build up the tone and strength of the thigh and leg muscles and to improve motion. Often times heat used for twenty to thirty minutes before working out will loosen up your tissues and help with improving the range of motion but do not use heat for the first two weeks following surgery (sometimes heat can increase post-operative swelling).   These exercises can be done on a training (exercise) mat, on the floor, on a table or on a bed. Use whatever works the best and is most comfortable for you.    Use music or television while you are exercising so that the exercises are a pleasant break in your day. This will make your life better with the exercises acting as a break in your routine that you can look forward to.   Perform all exercises about fifteen times, three times per day or as directed.  You should exercise both the operative leg and the other leg as well.   Exercises include:   Quad Sets - Tighten up the muscle on the front of the thigh (Quad) and hold for 5-10 seconds.   Straight Leg Raises - With your knee straight (if you were given a brace, keep it on), lift the leg to 60 degrees, hold for 3 seconds, and slowly lower the leg.  Perform this exercise against resistance later as your leg gets stronger.  Leg Slides: Lying on your back, slowly slide your foot toward your buttocks, bending your knee up off the floor (only go as far as is  comfortable). Then slowly slide your foot back down until your leg is flat on the floor again.  Angel Wings: Lying on your back spread your legs to the side as far apart as you can without causing discomfort.  Hamstring Strength:  Lying on your back,  push your heel against the floor with your leg straight by tightening up the muscles of your buttocks.  Repeat, but this time bend your knee to a comfortable angle, and push your heel against the floor.  You may put a pillow under the heel to make it more comfortable if necessary.   A rehabilitation program following joint replacement surgery can speed recovery and prevent re-injury in the future due to weakened muscles. Contact your doctor or a physical therapist for more information on knee rehabilitation.    CONSTIPATION  Constipation is defined medically as fewer than three stools per week and severe constipation as less than one stool per week.  Even if you have a regular bowel pattern at home, your normal regimen is likely to be disrupted due to multiple reasons following surgery.  Combination of anesthesia, postoperative narcotics, change in appetite and fluid intake all can affect your bowels.   YOU MUST use at least one of the following options; they are listed in order of increasing strength to get the job done.  They are all available over the counter, and you may need to use some, POSSIBLY even all of these options:    Drink plenty of fluids (prune juice may be helpful) and high fiber foods Colace 100 mg by mouth twice a day  Senokot for constipation as directed and as needed Dulcolax (bisacodyl), take with full glass of water  Miralax (polyethylene glycol) once or twice a day as needed.  If you have tried all these things and are unable to have a bowel movement in the first 3-4 days after surgery call either your surgeon or your primary doctor.    If you experience loose stools or diarrhea, hold the medications until you stool forms back  up.  If your symptoms do not get better within 1 week or if they get worse, check with your doctor.  If you experience "the worst abdominal pain ever" or develop nausea or vomiting, please contact the office immediately for further recommendations for treatment.   ITCHING:  If you experience itching with your medications, try taking only a single pain pill, or even half a pain pill at a time.  You can also use Benadryl over the counter for itching or also to help with sleep.   TED HOSE STOCKINGS:  Use stockings on both legs until for at least 2 weeks or as directed by physician office. They may be removed at night for sleeping.  MEDICATIONS:  See your medication summary on the "After Visit Summary" that nursing will review with you.  You may have some home medications which will be placed on hold until you complete the course of blood thinner medication.  It is important for you to complete the blood thinner medication as prescribed.  PRECAUTIONS:  If you experience chest pain or shortness of breath - call 911 immediately for transfer to the hospital emergency department.   If you develop a fever greater that 101 F, purulent drainage from wound, increased redness or drainage from wound, foul odor from the wound/dressing, or calf pain - CONTACT YOUR SURGEON.                                                   FOLLOW-UP APPOINTMENTS:  If you do not already have a post-op appointment, please call the office  for an appointment to be seen by your surgeon.  Guidelines for how soon to be seen are listed in your "After Visit Summary", but are typically between 1-4 weeks after surgery.  OTHER INSTRUCTIONS:  Do NOT take the diazepam (Valium) and methocarbamol (Robaxin) simultaneously. Alternate every 4-6 hours. Reduce to only the Robaxin once the muscle spasms are under control.    MAKE SURE YOU:  Understand these instructions.  Get help right away if you are not doing well or get worse.    Thank you for  letting us be a part of your medical care team.  It is a privilege we respect greatly.  We hope these instructions will help you stay on track for a fast and full recovery!     Increase activity slowly as tolerated    Complete by:  As directed             Medication List    STOP taking these medications        cyclobenzaprine 5 MG tablet  Commonly known as:  FLEXERIL     diclofenac 75 MG EC tablet  Commonly known as:  VOLTAREN     HYDROcodone-acetaminophen 10-325 MG per tablet  Commonly known as:  NORCO     HYDROmorphone 4 MG tablet  Commonly known as:  DILAUDID      TAKE these medications        diazepam 2 MG tablet  Commonly known as:  VALIUM  Take 1 tablet (2 mg total) by mouth every 6 (six) hours as needed for anxiety.     DSS 100 MG Caps  Take 100 mg by mouth 2 (two) times daily.     ferrous sulfate 325 (65 FE) MG tablet  Take 1 tablet (325 mg total) by mouth 3 (three) times daily after meals.     lisinopril-hydrochlorothiazide 20-12.5 MG per tablet  Commonly known as:  PRINZIDE,ZESTORETIC  Take 1 tablet by mouth every morning.     methocarbamol 500 MG tablet  Commonly known as:  ROBAXIN  Take 1 tablet (500 mg total) by mouth every 6 (six) hours as needed for muscle spasms.     Oxycodone HCl 10 MG Tabs  Take 1-2 tablets (10-20 mg total) by mouth every 4 (four) hours as needed for moderate pain.     rivaroxaban 10 MG Tabs tablet  Commonly known as:  XARELTO  Take 1 tablet (10 mg total) by mouth daily with breakfast.     tamsulosin 0.4 MG Caps capsule  Commonly known as:  FLOMAX  Take 1 capsule (0.4 mg total) by mouth daily.           Follow-up Information    Follow up with GIOFFRE,RONALD A, MD. Schedule an appointment as soon as possible for a visit in 2 weeks.   Specialty:  Orthopedic Surgery   Contact information:   [address] [place] [address] [phone]       Follow up with [facility].    Contact information:   [address] [place] [address] [phone]       Signed: Ardeen Jourdain, PA-C Orthopaedic Surgery 05/29/2014, 8:38 AM

## 2014-05-30 DIAGNOSIS — Z471 Aftercare following joint replacement surgery: Secondary | ICD-10-CM | POA: Diagnosis not present

## 2014-05-30 DIAGNOSIS — Z96651 Presence of right artificial knee joint: Secondary | ICD-10-CM | POA: Diagnosis not present

## 2014-06-01 DIAGNOSIS — Z96651 Presence of right artificial knee joint: Secondary | ICD-10-CM | POA: Diagnosis not present

## 2014-06-01 DIAGNOSIS — Z471 Aftercare following joint replacement surgery: Secondary | ICD-10-CM | POA: Diagnosis not present

## 2014-06-02 DIAGNOSIS — Z96651 Presence of right artificial knee joint: Secondary | ICD-10-CM | POA: Diagnosis not present

## 2014-06-02 DIAGNOSIS — Z471 Aftercare following joint replacement surgery: Secondary | ICD-10-CM | POA: Diagnosis not present

## 2014-06-05 DIAGNOSIS — Z96651 Presence of right artificial knee joint: Secondary | ICD-10-CM | POA: Diagnosis not present

## 2014-06-05 DIAGNOSIS — Z471 Aftercare following joint replacement surgery: Secondary | ICD-10-CM | POA: Diagnosis not present

## 2014-06-07 DIAGNOSIS — Z96651 Presence of right artificial knee joint: Secondary | ICD-10-CM | POA: Diagnosis not present

## 2014-06-07 DIAGNOSIS — Z471 Aftercare following joint replacement surgery: Secondary | ICD-10-CM | POA: Diagnosis not present

## 2014-06-08 DIAGNOSIS — Z96651 Presence of right artificial knee joint: Secondary | ICD-10-CM | POA: Diagnosis not present

## 2014-06-08 DIAGNOSIS — Z471 Aftercare following joint replacement surgery: Secondary | ICD-10-CM | POA: Diagnosis not present

## 2014-06-13 DIAGNOSIS — Z471 Aftercare following joint replacement surgery: Secondary | ICD-10-CM | POA: Diagnosis not present

## 2014-06-13 DIAGNOSIS — R2689 Other abnormalities of gait and mobility: Secondary | ICD-10-CM | POA: Diagnosis not present

## 2014-06-13 DIAGNOSIS — Z96651 Presence of right artificial knee joint: Secondary | ICD-10-CM | POA: Diagnosis not present

## 2014-06-14 DIAGNOSIS — R2689 Other abnormalities of gait and mobility: Secondary | ICD-10-CM | POA: Diagnosis not present

## 2014-06-14 DIAGNOSIS — Z471 Aftercare following joint replacement surgery: Secondary | ICD-10-CM | POA: Diagnosis not present

## 2014-06-14 DIAGNOSIS — Z96651 Presence of right artificial knee joint: Secondary | ICD-10-CM | POA: Diagnosis not present

## 2014-06-15 DIAGNOSIS — Z6837 Body mass index (BMI) 37.0-37.9, adult: Secondary | ICD-10-CM | POA: Diagnosis not present

## 2014-06-15 DIAGNOSIS — M1991 Primary osteoarthritis, unspecified site: Secondary | ICD-10-CM | POA: Diagnosis not present

## 2014-06-15 DIAGNOSIS — G894 Chronic pain syndrome: Secondary | ICD-10-CM | POA: Diagnosis not present

## 2014-06-16 DIAGNOSIS — Z96651 Presence of right artificial knee joint: Secondary | ICD-10-CM | POA: Diagnosis not present

## 2014-06-16 DIAGNOSIS — R2689 Other abnormalities of gait and mobility: Secondary | ICD-10-CM | POA: Diagnosis not present

## 2014-06-16 DIAGNOSIS — Z471 Aftercare following joint replacement surgery: Secondary | ICD-10-CM | POA: Diagnosis not present

## 2014-06-20 DIAGNOSIS — Z96651 Presence of right artificial knee joint: Secondary | ICD-10-CM | POA: Diagnosis not present

## 2014-06-20 DIAGNOSIS — M25551 Pain in right hip: Secondary | ICD-10-CM | POA: Diagnosis not present

## 2014-06-20 DIAGNOSIS — Z471 Aftercare following joint replacement surgery: Secondary | ICD-10-CM | POA: Diagnosis not present

## 2014-06-21 DIAGNOSIS — Z96651 Presence of right artificial knee joint: Secondary | ICD-10-CM | POA: Diagnosis not present

## 2014-06-21 DIAGNOSIS — R2689 Other abnormalities of gait and mobility: Secondary | ICD-10-CM | POA: Diagnosis not present

## 2014-06-21 DIAGNOSIS — Z471 Aftercare following joint replacement surgery: Secondary | ICD-10-CM | POA: Diagnosis not present

## 2014-06-23 DIAGNOSIS — Z96651 Presence of right artificial knee joint: Secondary | ICD-10-CM | POA: Diagnosis not present

## 2014-06-23 DIAGNOSIS — Z471 Aftercare following joint replacement surgery: Secondary | ICD-10-CM | POA: Diagnosis not present

## 2014-06-23 DIAGNOSIS — R2689 Other abnormalities of gait and mobility: Secondary | ICD-10-CM | POA: Diagnosis not present

## 2014-06-27 DIAGNOSIS — Z471 Aftercare following joint replacement surgery: Secondary | ICD-10-CM | POA: Diagnosis not present

## 2014-06-27 DIAGNOSIS — Z96651 Presence of right artificial knee joint: Secondary | ICD-10-CM | POA: Diagnosis not present

## 2014-06-27 DIAGNOSIS — R2689 Other abnormalities of gait and mobility: Secondary | ICD-10-CM | POA: Diagnosis not present

## 2014-06-28 DIAGNOSIS — Z96651 Presence of right artificial knee joint: Secondary | ICD-10-CM | POA: Diagnosis not present

## 2014-06-28 DIAGNOSIS — Z471 Aftercare following joint replacement surgery: Secondary | ICD-10-CM | POA: Diagnosis not present

## 2014-06-28 DIAGNOSIS — R2689 Other abnormalities of gait and mobility: Secondary | ICD-10-CM | POA: Diagnosis not present

## 2014-06-30 DIAGNOSIS — Z471 Aftercare following joint replacement surgery: Secondary | ICD-10-CM | POA: Diagnosis not present

## 2014-06-30 DIAGNOSIS — Z96651 Presence of right artificial knee joint: Secondary | ICD-10-CM | POA: Diagnosis not present

## 2014-06-30 DIAGNOSIS — R2689 Other abnormalities of gait and mobility: Secondary | ICD-10-CM | POA: Diagnosis not present

## 2014-07-04 DIAGNOSIS — Z471 Aftercare following joint replacement surgery: Secondary | ICD-10-CM | POA: Diagnosis not present

## 2014-07-04 DIAGNOSIS — R2689 Other abnormalities of gait and mobility: Secondary | ICD-10-CM | POA: Diagnosis not present

## 2014-07-04 DIAGNOSIS — Z96651 Presence of right artificial knee joint: Secondary | ICD-10-CM | POA: Diagnosis not present

## 2014-07-05 DIAGNOSIS — Z96651 Presence of right artificial knee joint: Secondary | ICD-10-CM | POA: Diagnosis not present

## 2014-07-05 DIAGNOSIS — R2689 Other abnormalities of gait and mobility: Secondary | ICD-10-CM | POA: Diagnosis not present

## 2014-07-05 DIAGNOSIS — Z471 Aftercare following joint replacement surgery: Secondary | ICD-10-CM | POA: Diagnosis not present

## 2014-07-06 DIAGNOSIS — R2689 Other abnormalities of gait and mobility: Secondary | ICD-10-CM | POA: Diagnosis not present

## 2014-07-06 DIAGNOSIS — Z96651 Presence of right artificial knee joint: Secondary | ICD-10-CM | POA: Diagnosis not present

## 2014-07-06 DIAGNOSIS — Z471 Aftercare following joint replacement surgery: Secondary | ICD-10-CM | POA: Diagnosis not present

## 2014-07-12 DIAGNOSIS — R2689 Other abnormalities of gait and mobility: Secondary | ICD-10-CM | POA: Diagnosis not present

## 2014-07-12 DIAGNOSIS — Z96651 Presence of right artificial knee joint: Secondary | ICD-10-CM | POA: Diagnosis not present

## 2014-07-12 DIAGNOSIS — Z471 Aftercare following joint replacement surgery: Secondary | ICD-10-CM | POA: Diagnosis not present

## 2014-07-19 DIAGNOSIS — R2689 Other abnormalities of gait and mobility: Secondary | ICD-10-CM | POA: Diagnosis not present

## 2014-07-19 DIAGNOSIS — Z471 Aftercare following joint replacement surgery: Secondary | ICD-10-CM | POA: Diagnosis not present

## 2014-07-19 DIAGNOSIS — Z96651 Presence of right artificial knee joint: Secondary | ICD-10-CM | POA: Diagnosis not present

## 2014-07-20 DIAGNOSIS — R2689 Other abnormalities of gait and mobility: Secondary | ICD-10-CM | POA: Diagnosis not present

## 2014-07-20 DIAGNOSIS — Z96651 Presence of right artificial knee joint: Secondary | ICD-10-CM | POA: Diagnosis not present

## 2014-07-20 DIAGNOSIS — Z471 Aftercare following joint replacement surgery: Secondary | ICD-10-CM | POA: Diagnosis not present

## 2014-07-25 DIAGNOSIS — Z471 Aftercare following joint replacement surgery: Secondary | ICD-10-CM | POA: Diagnosis not present

## 2014-07-25 DIAGNOSIS — R2689 Other abnormalities of gait and mobility: Secondary | ICD-10-CM | POA: Diagnosis not present

## 2014-07-25 DIAGNOSIS — Z96651 Presence of right artificial knee joint: Secondary | ICD-10-CM | POA: Diagnosis not present

## 2014-07-26 DIAGNOSIS — R2689 Other abnormalities of gait and mobility: Secondary | ICD-10-CM | POA: Diagnosis not present

## 2014-07-26 DIAGNOSIS — Z471 Aftercare following joint replacement surgery: Secondary | ICD-10-CM | POA: Diagnosis not present

## 2014-07-26 DIAGNOSIS — Z96651 Presence of right artificial knee joint: Secondary | ICD-10-CM | POA: Diagnosis not present

## 2014-07-28 DIAGNOSIS — Z471 Aftercare following joint replacement surgery: Secondary | ICD-10-CM | POA: Diagnosis not present

## 2014-07-28 DIAGNOSIS — Z96651 Presence of right artificial knee joint: Secondary | ICD-10-CM | POA: Diagnosis not present

## 2014-07-28 DIAGNOSIS — R2689 Other abnormalities of gait and mobility: Secondary | ICD-10-CM | POA: Diagnosis not present

## 2014-08-11 DIAGNOSIS — Z96651 Presence of right artificial knee joint: Secondary | ICD-10-CM | POA: Diagnosis not present

## 2014-08-11 DIAGNOSIS — Z471 Aftercare following joint replacement surgery: Secondary | ICD-10-CM | POA: Diagnosis not present

## 2014-08-11 DIAGNOSIS — R2689 Other abnormalities of gait and mobility: Secondary | ICD-10-CM | POA: Diagnosis not present

## 2014-08-30 DIAGNOSIS — M1991 Primary osteoarthritis, unspecified site: Secondary | ICD-10-CM | POA: Diagnosis not present

## 2014-08-30 DIAGNOSIS — Z1389 Encounter for screening for other disorder: Secondary | ICD-10-CM | POA: Diagnosis not present

## 2014-08-30 DIAGNOSIS — Z6839 Body mass index (BMI) 39.0-39.9, adult: Secondary | ICD-10-CM | POA: Diagnosis not present

## 2014-08-30 DIAGNOSIS — G894 Chronic pain syndrome: Secondary | ICD-10-CM | POA: Diagnosis not present

## 2014-09-04 DIAGNOSIS — M9902 Segmental and somatic dysfunction of thoracic region: Secondary | ICD-10-CM | POA: Diagnosis not present

## 2014-09-04 DIAGNOSIS — M9901 Segmental and somatic dysfunction of cervical region: Secondary | ICD-10-CM | POA: Diagnosis not present

## 2014-09-04 DIAGNOSIS — M5412 Radiculopathy, cervical region: Secondary | ICD-10-CM | POA: Diagnosis not present

## 2014-09-04 DIAGNOSIS — M546 Pain in thoracic spine: Secondary | ICD-10-CM | POA: Diagnosis not present

## 2014-11-02 DIAGNOSIS — Z471 Aftercare following joint replacement surgery: Secondary | ICD-10-CM | POA: Diagnosis not present

## 2014-11-02 DIAGNOSIS — Z96651 Presence of right artificial knee joint: Secondary | ICD-10-CM | POA: Diagnosis not present

## 2014-11-19 DIAGNOSIS — Z79899 Other long term (current) drug therapy: Secondary | ICD-10-CM | POA: Diagnosis not present

## 2014-11-19 DIAGNOSIS — M25511 Pain in right shoulder: Secondary | ICD-10-CM | POA: Diagnosis not present

## 2014-12-06 DIAGNOSIS — Z1389 Encounter for screening for other disorder: Secondary | ICD-10-CM | POA: Diagnosis not present

## 2014-12-06 DIAGNOSIS — Z6841 Body Mass Index (BMI) 40.0 and over, adult: Secondary | ICD-10-CM | POA: Diagnosis not present

## 2014-12-06 DIAGNOSIS — M722 Plantar fascial fibromatosis: Secondary | ICD-10-CM | POA: Diagnosis not present

## 2014-12-06 DIAGNOSIS — G894 Chronic pain syndrome: Secondary | ICD-10-CM | POA: Diagnosis not present

## 2014-12-06 DIAGNOSIS — S4350XD Sprain of unspecified acromioclavicular joint, subsequent encounter: Secondary | ICD-10-CM | POA: Diagnosis not present

## 2015-01-09 DIAGNOSIS — M722 Plantar fascial fibromatosis: Secondary | ICD-10-CM | POA: Diagnosis not present

## 2015-01-09 DIAGNOSIS — Z23 Encounter for immunization: Secondary | ICD-10-CM | POA: Diagnosis not present

## 2015-01-09 DIAGNOSIS — Z6841 Body Mass Index (BMI) 40.0 and over, adult: Secondary | ICD-10-CM | POA: Diagnosis not present

## 2015-01-09 DIAGNOSIS — M2012 Hallux valgus (acquired), left foot: Secondary | ICD-10-CM | POA: Diagnosis not present

## 2015-01-11 DIAGNOSIS — M5412 Radiculopathy, cervical region: Secondary | ICD-10-CM | POA: Diagnosis not present

## 2015-01-11 DIAGNOSIS — M9903 Segmental and somatic dysfunction of lumbar region: Secondary | ICD-10-CM | POA: Diagnosis not present

## 2015-01-11 DIAGNOSIS — M9901 Segmental and somatic dysfunction of cervical region: Secondary | ICD-10-CM | POA: Diagnosis not present

## 2015-01-11 DIAGNOSIS — M545 Low back pain: Secondary | ICD-10-CM | POA: Diagnosis not present

## 2015-02-11 DIAGNOSIS — C61 Malignant neoplasm of prostate: Secondary | ICD-10-CM

## 2015-02-11 HISTORY — DX: Malignant neoplasm of prostate: C61

## 2015-03-01 DIAGNOSIS — Z1389 Encounter for screening for other disorder: Secondary | ICD-10-CM | POA: Diagnosis not present

## 2015-03-01 DIAGNOSIS — G894 Chronic pain syndrome: Secondary | ICD-10-CM | POA: Diagnosis not present

## 2015-03-01 DIAGNOSIS — H6121 Impacted cerumen, right ear: Secondary | ICD-10-CM | POA: Diagnosis not present

## 2015-03-01 DIAGNOSIS — I10 Essential (primary) hypertension: Secondary | ICD-10-CM | POA: Diagnosis not present

## 2015-03-01 DIAGNOSIS — Z6841 Body Mass Index (BMI) 40.0 and over, adult: Secondary | ICD-10-CM | POA: Diagnosis not present

## 2015-03-08 DIAGNOSIS — M9901 Segmental and somatic dysfunction of cervical region: Secondary | ICD-10-CM | POA: Diagnosis not present

## 2015-03-08 DIAGNOSIS — M545 Low back pain: Secondary | ICD-10-CM | POA: Diagnosis not present

## 2015-03-08 DIAGNOSIS — M9903 Segmental and somatic dysfunction of lumbar region: Secondary | ICD-10-CM | POA: Diagnosis not present

## 2015-03-08 DIAGNOSIS — M5412 Radiculopathy, cervical region: Secondary | ICD-10-CM | POA: Diagnosis not present

## 2015-04-04 DIAGNOSIS — Z6841 Body Mass Index (BMI) 40.0 and over, adult: Secondary | ICD-10-CM | POA: Diagnosis not present

## 2015-04-04 DIAGNOSIS — Z23 Encounter for immunization: Secondary | ICD-10-CM | POA: Diagnosis not present

## 2015-04-04 DIAGNOSIS — Z1389 Encounter for screening for other disorder: Secondary | ICD-10-CM | POA: Diagnosis not present

## 2015-04-04 DIAGNOSIS — Z0000 Encounter for general adult medical examination without abnormal findings: Secondary | ICD-10-CM | POA: Diagnosis not present

## 2015-04-04 DIAGNOSIS — I10 Essential (primary) hypertension: Secondary | ICD-10-CM | POA: Diagnosis not present

## 2015-05-16 DIAGNOSIS — Z6841 Body Mass Index (BMI) 40.0 and over, adult: Secondary | ICD-10-CM | POA: Diagnosis not present

## 2015-05-16 DIAGNOSIS — G894 Chronic pain syndrome: Secondary | ICD-10-CM | POA: Diagnosis not present

## 2015-05-16 DIAGNOSIS — Z1389 Encounter for screening for other disorder: Secondary | ICD-10-CM | POA: Diagnosis not present

## 2015-05-21 DIAGNOSIS — R3915 Urgency of urination: Secondary | ICD-10-CM | POA: Diagnosis not present

## 2015-05-21 DIAGNOSIS — R972 Elevated prostate specific antigen [PSA]: Secondary | ICD-10-CM | POA: Diagnosis not present

## 2015-05-21 DIAGNOSIS — R351 Nocturia: Secondary | ICD-10-CM | POA: Diagnosis not present

## 2015-07-13 DIAGNOSIS — G894 Chronic pain syndrome: Secondary | ICD-10-CM | POA: Diagnosis not present

## 2015-07-13 DIAGNOSIS — Z6841 Body Mass Index (BMI) 40.0 and over, adult: Secondary | ICD-10-CM | POA: Diagnosis not present

## 2015-07-13 DIAGNOSIS — R972 Elevated prostate specific antigen [PSA]: Secondary | ICD-10-CM | POA: Diagnosis not present

## 2015-08-03 DIAGNOSIS — R3915 Urgency of urination: Secondary | ICD-10-CM | POA: Diagnosis not present

## 2015-08-03 DIAGNOSIS — R972 Elevated prostate specific antigen [PSA]: Secondary | ICD-10-CM | POA: Diagnosis not present

## 2015-08-30 DIAGNOSIS — C61 Malignant neoplasm of prostate: Secondary | ICD-10-CM | POA: Diagnosis not present

## 2015-08-30 DIAGNOSIS — R972 Elevated prostate specific antigen [PSA]: Secondary | ICD-10-CM | POA: Diagnosis not present

## 2015-09-13 DIAGNOSIS — C61 Malignant neoplasm of prostate: Secondary | ICD-10-CM | POA: Diagnosis not present

## 2015-09-13 DIAGNOSIS — R972 Elevated prostate specific antigen [PSA]: Secondary | ICD-10-CM | POA: Diagnosis not present

## 2015-09-14 DIAGNOSIS — M5412 Radiculopathy, cervical region: Secondary | ICD-10-CM | POA: Diagnosis not present

## 2015-09-14 DIAGNOSIS — M9902 Segmental and somatic dysfunction of thoracic region: Secondary | ICD-10-CM | POA: Diagnosis not present

## 2015-09-14 DIAGNOSIS — M9901 Segmental and somatic dysfunction of cervical region: Secondary | ICD-10-CM | POA: Diagnosis not present

## 2015-09-14 DIAGNOSIS — M9903 Segmental and somatic dysfunction of lumbar region: Secondary | ICD-10-CM | POA: Diagnosis not present

## 2015-09-14 DIAGNOSIS — M546 Pain in thoracic spine: Secondary | ICD-10-CM | POA: Diagnosis not present

## 2015-09-14 DIAGNOSIS — M545 Low back pain: Secondary | ICD-10-CM | POA: Diagnosis not present

## 2015-09-28 DIAGNOSIS — C61 Malignant neoplasm of prostate: Secondary | ICD-10-CM | POA: Diagnosis not present

## 2015-09-28 DIAGNOSIS — R972 Elevated prostate specific antigen [PSA]: Secondary | ICD-10-CM | POA: Diagnosis not present

## 2015-11-13 DIAGNOSIS — C61 Malignant neoplasm of prostate: Secondary | ICD-10-CM | POA: Diagnosis not present

## 2015-11-16 DIAGNOSIS — L918 Other hypertrophic disorders of the skin: Secondary | ICD-10-CM | POA: Diagnosis not present

## 2015-11-16 DIAGNOSIS — M1991 Primary osteoarthritis, unspecified site: Secondary | ICD-10-CM | POA: Diagnosis not present

## 2015-11-16 DIAGNOSIS — Z6841 Body Mass Index (BMI) 40.0 and over, adult: Secondary | ICD-10-CM | POA: Diagnosis not present

## 2015-11-16 DIAGNOSIS — G894 Chronic pain syndrome: Secondary | ICD-10-CM | POA: Diagnosis not present

## 2015-11-20 ENCOUNTER — Other Ambulatory Visit: Payer: Self-pay | Admitting: Urology

## 2015-11-20 NOTE — Progress Notes (Signed)
Surgery on 11/29/15.  Need to sign off orders in EPIc. Thank You.

## 2015-11-23 DIAGNOSIS — M6281 Muscle weakness (generalized): Secondary | ICD-10-CM | POA: Diagnosis not present

## 2015-11-23 DIAGNOSIS — C61 Malignant neoplasm of prostate: Secondary | ICD-10-CM | POA: Diagnosis not present

## 2015-11-26 DIAGNOSIS — M6281 Muscle weakness (generalized): Secondary | ICD-10-CM | POA: Diagnosis not present

## 2015-11-26 DIAGNOSIS — N393 Stress incontinence (female) (male): Secondary | ICD-10-CM | POA: Diagnosis not present

## 2015-11-26 DIAGNOSIS — R278 Other lack of coordination: Secondary | ICD-10-CM | POA: Diagnosis not present

## 2015-11-26 NOTE — Patient Instructions (Addendum)
Andrew Haley  11/26/2015   Your procedure is scheduled on: 11/29/2015    Report to [facility] Main  Entrance take [place]  elevators to 3rd floor to  [place] at   0930 AM.  Call this number if you have problems the morning of surgery [phone]   Remember: ONLY 1 PERSON MAY GO WITH YOU TO SHORT STAY TO GET  READY MORNING OF [place].  Do not eat food or drink liquids :After Midnight.             Clear liqiuid diet the day before surgery                Magnesium Citrate- Drink by 12 noon day before surgery.               Fleets enema- nite before surgery.       Take these medicines the morning of surgery with A SIP OF WATER: Valium if needed, Oxycodone if needed, Flomax                                 You may not have any metal on your body including hair pins and              piercings  Do not wear jewelry,  lotions, powders or perfumes, deodorant                           Men may shave face and neck.   Do not bring valuables to the hospital. [place].  Contacts, dentures or bridgework may not be worn into surgery.  Leave suitcase in the car. After surgery it may be brought to your room.     Special Instructions:               Please read over the following fact sheets you were given: _____________________________________________________________________                CLEAR LIQUID DIET   Foods Allowed                                                                     Foods Excluded  Coffee and tea, regular and decaf                             liquids that you cannot  Plain Jell-O in any flavor                                             see through such as: Fruit ices (not with fruit pulp)  milk, soups, orange juice  Iced Popsicles                                    All solid food Carbonated beverages, regular and diet                                     Cranberry, grape and apple juices Sports drinks like Gatorade Lightly seasoned clear broth or consume(fat free) Sugar, honey syrup  Sample Menu Breakfast                                Lunch                                     Supper Cranberry juice                    Beef broth                            Chicken broth Jell-O                                     Grape juice                           Apple juice Coffee or tea                        Jell-O                                      Popsicle                                                Coffee or tea                        Coffee or tea  _____________________________________________________________________  [facility] - Preparing for Surgery Before surgery, you can play an important role.  Because skin is not sterile, your skin needs to be as free of germs as possible.  You can reduce the number of germs on your skin by washing with CHG (chlorahexidine gluconate) soap before surgery.  CHG is an antiseptic cleaner which kills germs and bonds with the skin to continue killing germs even after washing. Please DO NOT use if you have an allergy to CHG or antibacterial soaps.  If your skin becomes reddened/irritated stop using the CHG and inform your nurse when you arrive at Short Stay. Do not shave (including legs and underarms) for at least 48 hours prior to the first CHG shower.  You may shave your face/neck. Please follow these instructions carefully:  1.  Shower with CHG Soap the night before surgery and the  morning of Surgery.  2.  If you choose to  wash your hair, wash your hair first as usual with your  normal  shampoo.  3.  After you shampoo, rinse your hair and body thoroughly to remove the  shampoo.                           4.  Use CHG as you would any other liquid soap.  You can apply chg directly  to the skin and wash                       Gently with a scrungie or clean washcloth.  5.  Apply the CHG Soap to your  body ONLY FROM THE NECK DOWN.   Do not use on face/ open                           Wound or open sores. Avoid contact with eyes, ears mouth and genitals (private parts).                       Wash face,  Genitals (private parts) with your normal soap.             6.  Wash thoroughly, paying special attention to the area where your surgery  will be performed.  7.  Thoroughly rinse your body with warm water from the neck down.  8.  DO NOT shower/wash with your normal soap after using and rinsing off  the CHG Soap.                9.  Pat yourself dry with a clean towel.            10.  Wear clean pajamas.            11.  Place clean sheets on your bed the night of your first shower and do not  sleep with pets. Day of Surgery : Do not apply any lotions/deodorants the morning of surgery.  Please wear clean clothes to the hospital/surgery center.  FAILURE TO FOLLOW THESE INSTRUCTIONS MAY RESULT IN THE CANCELLATION OF YOUR SURGERY PATIENT SIGNATURE_________________________________  NURSE SIGNATURE__________________________________  ________________________________________________________________________  WHAT IS A BLOOD TRANSFUSION? Blood Transfusion Information  A transfusion is the replacement of blood or some of its parts. Blood is made up of multiple cells which provide different functions.  Red blood cells carry oxygen and are used for blood loss replacement.  White blood cells fight against infection.  Platelets control bleeding.  Plasma helps clot blood.  Other blood products are available for specialized needs, such as hemophilia or other clotting disorders. BEFORE THE TRANSFUSION  Who gives blood for transfusions?   Healthy volunteers who are fully evaluated to make sure their blood is safe. This is blood bank blood. Transfusion therapy is the safest it has ever been in the practice of medicine. Before blood is taken from a donor, a complete history is taken to make sure that  person has no history of diseases nor engages in risky social behavior (examples are intravenous drug use or sexual activity with multiple partners). The donor's travel history is screened to minimize risk of transmitting infections, such as malaria. The donated blood is tested for signs of infectious diseases, such as HIV and hepatitis. The blood is then tested to be sure it is compatible with you in order to minimize the chance of a transfusion reaction. If you or  a relative donates blood, this is often done in anticipation of surgery and is not appropriate for emergency situations. It takes many days to process the donated blood. RISKS AND COMPLICATIONS Although transfusion therapy is very safe and saves many lives, the main dangers of transfusion include:   Getting an infectious disease.  Developing a transfusion reaction. This is an allergic reaction to something in the blood you were given. Every precaution is taken to prevent this. The decision to have a blood transfusion has been considered carefully by your caregiver before blood is given. Blood is not given unless the benefits outweigh the risks. AFTER THE TRANSFUSION  Right after receiving a blood transfusion, you will usually feel much better and more energetic. This is especially true if your red blood cells have gotten low (anemic). The transfusion raises the level of the red blood cells which carry oxygen, and this usually causes an energy increase.  The nurse administering the transfusion will monitor you carefully for complications. HOME CARE INSTRUCTIONS  No special instructions are needed after a transfusion. You may find your energy is better. Speak with your caregiver about any limitations on activity for underlying diseases you may have. SEEK MEDICAL CARE IF:   Your condition is not improving after your transfusion.  You develop redness or irritation at the intravenous (IV) site. SEEK IMMEDIATE MEDICAL CARE IF:  Any of the  following symptoms occur over the next 12 hours:  Shaking chills.  You have a temperature by mouth above 102 F (38.9 C), not controlled by medicine.  Chest, back, or muscle pain.  People around you feel you are not acting correctly or are confused.  Shortness of breath or difficulty breathing.  Dizziness and fainting.  You get a rash or develop hives.  You have a decrease in urine output.  Your urine turns a dark color or changes to pink, red, or brown. Any of the following symptoms occur over the next 10 days:  You have a temperature by mouth above 102 F (38.9 C), not controlled by medicine.  Shortness of breath.  Weakness after normal activity.  The white part of the eye turns yellow (jaundice).  You have a decrease in the amount of urine or are urinating less often.  Your urine turns a dark color or changes to pink, red, or brown. Document Released: 01/25/2000 Document Revised: 04/21/2011 Document Reviewed: 09/13/2007 ExitCare Patient Information 2014 [place].  _______________________________________________________________________  Incentive Spirometer  An incentive spirometer is a tool that can help keep your lungs clear and active. This tool measures how well you are filling your lungs with each breath. Taking long deep breaths may help reverse or decrease the chance of developing breathing (pulmonary) problems (especially infection) following:  A long period of time when you are unable to move or be active. BEFORE THE PROCEDURE   If the spirometer includes an indicator to show your best effort, your nurse or respiratory therapist will set it to a desired goal.  If possible, sit up straight or lean slightly forward. Try not to slouch.  Hold the incentive spirometer in an upright position. INSTRUCTIONS FOR USE  1. Sit on the edge of your bed if possible, or sit up as far as you can in bed or on a chair. 2. Hold the incentive spirometer in an upright  position. 3. Breathe out normally. 4. Place the mouthpiece in your mouth and seal your lips tightly around it. 5. Breathe in slowly and as deeply as possible,  raising the piston or the ball toward the top of the column. 6. Hold your breath for 3-5 seconds or for as long as possible. Allow the piston or ball to fall to the bottom of the column. 7. Remove the mouthpiece from your mouth and breathe out normally. 8. Rest for a few seconds and repeat Steps 1 through 7 at least 10 times every 1-2 hours when you are awake. Take your time and take a few normal breaths between deep breaths. 9. The spirometer may include an indicator to show your best effort. Use the indicator as a goal to work toward during each repetition. 10. After each set of 10 deep breaths, practice coughing to be sure your lungs are clear. If you have an incision (the cut made at the time of surgery), support your incision when coughing by placing a pillow or rolled up towels firmly against it. Once you are able to get out of bed, walk around indoors and cough well. You may stop using the incentive spirometer when instructed by your caregiver.  RISKS AND COMPLICATIONS  Take your time so you do not get dizzy or light-headed.  If you are in pain, you may need to take or ask for pain medication before doing incentive spirometry. It is harder to take a deep breath if you are having pain. AFTER USE  Rest and breathe slowly and easily.  It can be helpful to keep track of a log of your progress. Your caregiver can provide you with a simple table to help with this. If you are using the spirometer at home, follow these instructions: [place] IF:   You are having difficultly using the spirometer.  You have trouble using the spirometer as often as instructed.  Your pain medication is not giving enough relief while using the spirometer.  You develop fever of 100.5 F (38.1 C) or higher. SEEK IMMEDIATE MEDICAL CARE IF:    You cough up bloody sputum that had not been present before.  You develop fever of 102 F (38.9 C) or greater.  You develop worsening pain at or near the incision site. MAKE SURE YOU:   Understand these instructions.  Will watch your condition.  Will get help right away if you are not doing well or get worse. Document Released: 06/09/2006 Document Revised: 04/21/2011 Document Reviewed: 08/10/2006 [facility] Patient Information 2014 [place], [place].   ________________________________________________________________________

## 2015-11-28 ENCOUNTER — Encounter (HOSPITAL_COMMUNITY): Payer: Self-pay

## 2015-11-28 ENCOUNTER — Ambulatory Visit (HOSPITAL_COMMUNITY)
Admission: RE | Admit: 2015-11-28 | Discharge: 2015-11-28 | Disposition: A | Discharge status: Home / Self Care | Payer: Commercial Managed Care - HMO | Source: Ambulatory Visit | Attending: Urology | Admitting: Urology

## 2015-11-28 ENCOUNTER — Encounter (HOSPITAL_COMMUNITY)
Admission: RE | Admit: 2015-11-28 | Discharge: 2015-11-28 | Disposition: A | Discharge status: Home / Self Care | Payer: Commercial Managed Care - HMO | Source: Ambulatory Visit | Attending: Urology | Admitting: Urology

## 2015-11-28 DIAGNOSIS — Z01810 Encounter for preprocedural cardiovascular examination: Secondary | ICD-10-CM

## 2015-11-28 DIAGNOSIS — Z79899 Other long term (current) drug therapy: Secondary | ICD-10-CM | POA: Diagnosis not present

## 2015-11-28 DIAGNOSIS — Z01818 Encounter for other preprocedural examination: Secondary | ICD-10-CM | POA: Insufficient documentation

## 2015-11-28 DIAGNOSIS — C61 Malignant neoplasm of prostate: Secondary | ICD-10-CM | POA: Diagnosis not present

## 2015-11-28 DIAGNOSIS — Z791 Long term (current) use of non-steroidal anti-inflammatories (NSAID): Secondary | ICD-10-CM | POA: Diagnosis not present

## 2015-11-28 DIAGNOSIS — R918 Other nonspecific abnormal finding of lung field: Secondary | ICD-10-CM | POA: Diagnosis not present

## 2015-11-28 DIAGNOSIS — Z79891 Long term (current) use of opiate analgesic: Secondary | ICD-10-CM | POA: Diagnosis not present

## 2015-11-28 LAB — BASIC METABOLIC PANEL
Anion gap: 10 (ref 5–15)
BUN: 15 mg/dL (ref 6–20)
CO2: 28 mmol/L (ref 22–32)
Calcium: 8.9 mg/dL (ref 8.9–10.3)
Chloride: 100 mmol/L — ABNORMAL LOW (ref 101–111)
Creatinine, Ser: 0.84 mg/dL (ref 0.61–1.24)
GFR calc Af Amer: >60 mL/min (ref >60)
Glucose, Bld: 92 mg/dL (ref 65–99)
Potassium: 4 mmol/L (ref 3.5–5.1)
SODIUM: 138 mmol/L (ref 135–145)

## 2015-11-28 LAB — CBC
HCT: 39.8 % (ref 39.0–52.0)
Hemoglobin: 12.6 g/dL — ABNORMAL LOW (ref 13.0–17.0)
MCH: 26.8 pg (ref 26.0–34.0)
MCHC: 31.7 g/dL (ref 30.0–36.0)
MCV: 84.5 fL (ref 78.0–100.0)
Platelets: 248 10*3/uL (ref 150–400)
RBC: 4.71 MIL/uL (ref 4.22–5.81)
RDW: 12.8 % (ref 11.5–15.5)
WBC: 7.4 10*3/uL (ref 4.0–10.5)

## 2015-11-28 MED ORDER — DEXTROSE 5 % IV SOLN
3.0000 g | INTRAVENOUS | Status: AC
  Administered 2015-11-29: 3 g via INTRAVENOUS
  Filled 2015-11-28: qty 3

## 2015-11-28 NOTE — Progress Notes (Signed)
Patient scored a 6 on the STOP BANG Assessment Tool during preop appointment for surgery on 11/29/15.  This score evaluates the patient as a High Risk for Obstructive Sleep Apnea using this tool.Juluis Rainier.

## 2015-11-28 NOTE — H&P (Signed)
Office Visit Report     11/13/2015   --------------------------------------------------------------------------------   Andrew Haley  MRN: [id number]  PRIMARY CARE:    DOB: 07/29/1965, 50 year old Male  REFERRING:  Brad L. Exie Parody, MD  SSN: -[id number]  PROVIDER:  Raynelle Bring, M.D.    LOCATION:  [facility] [phone]   --------------------------------------------------------------------------------   CC/HPI: CC: Prostate Cancer   Physician requesting consult: Dr. Clyde Lundborg  PCP: Dr. Redmond School   Andrew Haley is a 50 year old gentleman who was noted to have an elevated PSA of 7.9 that was 11.9 when repeated. He underwent a TRUS biopsy of the prostate on 08/30/15 demonstrating Gleason 3+3=6 adenocarcinoma of the prostate in 1 out of 18 biopsy cores.   Family history: None   Imaging studies: None   PMH: He has a history of hypertension.  PSH: Bilateral TKA.   TNM stage: cT1c Nx Mx  PSA: 11.9  Gleason score: 3+3=6  Biopsy (08/30/15): 1/18 cores positive  Left: Benign  Right: R mid (30%, 3+3=6)  Prostate volume: 114 cc  PSAD: 0.10   Nomogram  OC disease: 68%  EPE: 31%  SVI: 1%  LNI: 1%  PFS (5 year, 10 year): 94%,89%   Urinary function: IPSS is 13.  Erectile function: SHIM score is 20. He states that he can achieve erections adequate for recurrence without the need for medication.     ALLERGIES: No Known Drug Allergies    MEDICATIONS: Lisinopril  Diclofenac Sodium  Hydrocodone-Acetaminophen 10 mg-325 mg tablet     GU PSH: None   NON-GU PSH: Total Knee Replacement, Bilateral    GU PMH: None   NON-GU PMH: Arthritis Hypertension    FAMILY HISTORY: Death of family member - Father, Mother   SOCIAL HISTORY: Marital Status: Single Current Smoking Status: Patient has never smoked.  Drinks 2 drinks per day.  Drinks 2 caffeinated drinks per day. 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    REVIEW OF SYSTEMS:    GU Review Male:   Patient  reports get up at night to urinate, trouble starting your streams, and have to strain to urinate . Patient denies frequent urination, hard to postpone urination, burning/ pain with urination, leakage of urine, and stream starts and stops.  Gastrointestinal (Upper):   Patient denies nausea and vomiting.  Gastrointestinal (Lower):   Patient denies diarrhea and constipation.  Constitutional:   Patient denies fever, night sweats, weight loss, and fatigue.  Skin:   Patient denies skin rash/ lesion and itching.  Eyes:   Patient denies blurred vision and double vision.  Ears/ Nose/ Throat:   Patient reports sinus problems. Patient denies sore throat.  Hematologic/Lymphatic:   Patient denies swollen glands and easy bruising.  Cardiovascular:   Patient denies leg swelling and chest pains.  Respiratory:   Patient denies cough and shortness of breath.  Endocrine:   Patient denies excessive thirst.  Musculoskeletal:   Patient reports joint pain. Patient denies back pain.  Neurological:   Patient denies headaches and dizziness.  Psychologic:   Patient denies depression and anxiety.   VITAL SIGNS:      11/13/2015 09:42 AM  Weight 305 lb / 138.35 kg  Height 75 in / 190.5 cm  BP 148/108 mmHg  Pulse 89 /min  BMI 38.1 kg/m   GU PHYSICAL EXAMINATION:    Prostate: His exam is limited by his body habitus. The apical portion of the prostate is somewhat firm but consistently so. No discrete nodularity  noted.   MULTI-SYSTEM PHYSICAL EXAMINATION:    Constitutional: Well-nourished. No physical deformities. Normally developed. Good grooming.  Neck: Neck symmetrical, not swollen. Normal tracheal position.  Respiratory: No labored breathing, no use of accessory muscles.   Cardiovascular: Normal temperature, normal extremity pulses, no swelling, no varicosities.  Lymphatic: No enlargement of neck, axillae, groin.  Skin: No paleness, no jaundice, no cyanosis. No lesion, no ulcer, no rash.  Neurologic /  Psychiatric: Oriented to time, oriented to place, oriented to person. No depression, no anxiety, no agitation.  Gastrointestinal: Obese, soft, nondistended.   Eyes: Normal conjunctivae. Normal eyelids.  Ears, Nose, Mouth, and Throat: Left ear no scars, no lesions, no masses. Right ear no scars, no lesions, no masses. Nose no scars, no lesions, no masses. Normal hearing. Normal lips.  Musculoskeletal: Normal gait and station of head and neck.     PAST DATA REVIEWED:  Source Of History:  Patient  Lab Test Review:   PSA  Records Review:   Pathology Reports, Previous Patient Records  Urine Test Review:   Urinalysis   PROCEDURES:          Urinalysis - 81003 Dipstick Dipstick Cont'd  Specimen: Voided Bilirubin: Neg  Color: Orange Ketones: Neg  Appearance: Clear Blood: Neg  Specific Gravity: >= 1.030 Protein: Trace  pH: 5.5 Urobilinogen: 1.0  Glucose: Neg Nitrites: Neg    Leukocyte Esterase: Neg    ASSESSMENT:      ICD-10 Details  1 GU:   Prostate Cancer - C61    PLAN:           Schedule Return Visit: Other See Visit Notes             Note: Will call to schedule surgery  Return Visit: Next Available Appointment - PT/OT Referral             Note: Prior to surgery          Document Letter(s):  Created for Patient: Clinical Summary         Notes:   1. Prostate cancer: I had a detailed discussion with Andrew Haley today. The patient was counseled about the natural history of prostate cancer and the standard treatment options that are available for prostate cancer. It was explained to him how his age and life expectancy, clinical stage, Gleason score, and PSA affect his prognosis, the decision to proceed with additional staging studies, as well as how that information influences recommended treatment strategies. We discussed the roles for active surveillance, radiation therapy, surgical therapy, androgen deprivation, as well as ablative therapy options for the treatment of prostate cancer  as appropriate to his individual cancer situation. We discussed the risks and benefits of these options with regard to their impact on cancer control and also in terms of potential adverse events, complications, and impact on quality of life particularly related to urinary and sexual function. The patient was encouraged to ask questions throughout the discussion today and all questions were answered to his stated satisfaction. In addition, the patient was provided with and/or directed to appropriate resources and literature for further education about prostate cancer and treatment options.   We discussed surgical therapy for prostate cancer including the different available surgical approaches. We discussed, in detail, the risks and expectations of surgery with regard to cancer control, urinary control, and erectile function as well as the expected postoperative recovery process. Additional risks of surgery including but not limited to bleeding, infection, hernia formation, nerve damage, lymphocele formation, bowel/rectal injury  potentially necessitating colostomy, damage to the urinary tract resulting in urine leakage, urethral stricture, and the cardiopulmonary risks such as myocardial infarction, stroke, death, venothromboembolism, etc. were explained. The risk of open surgical conversion for robotic/laparoscopic prostatectomy was also discussed.   We did discuss the options of active surveillance versus curative therapy options and the pros and cons of each approach. I did express my concern about his PSA which technically puts and into the intermediate risk category. However, he does have a very enlarged prostate that may explain his PSA. We discussed the option of proceeding with an MRI and repeat biopsy within 6 months of his initial diagnosis versus proceeding with definitive therapy.   After discussing options, considering his young age, and large prostate, and developing lower urinary tract symptoms,  he is most interested in proceeding with curative therapy and would like to proceed with surgical therapy. He will be scheduled for a bilateral nerve sparing robot-assisted laparoscopic radical prostatectomy and pelvic lymphadenectomy. Considering his obesity and his enlarged prostate, he understands the increased complexity of this procedure although it should be able to be performed without major risk for complication.   Cc: Dr. Redmond School  Dr. Clyde Lundborg          * Signed by Raynelle Bring, M.D. on 11/13/15 at 2:15 PM (EDT)*

## 2015-11-29 ENCOUNTER — Inpatient Hospital Stay (HOSPITAL_COMMUNITY): Payer: Commercial Managed Care - HMO | Admitting: Anesthesiology

## 2015-11-29 ENCOUNTER — Encounter (HOSPITAL_COMMUNITY): Payer: Self-pay | Admitting: *Deleted

## 2015-11-29 ENCOUNTER — Inpatient Hospital Stay (HOSPITAL_COMMUNITY)
Admission: RE | Admit: 2015-11-29 | Discharge: 2015-11-30 | DRG: 708 | Disposition: A | Discharge status: Home / Self Care | Payer: Commercial Managed Care - HMO | Source: Ambulatory Visit | Attending: Urology | Admitting: Urology

## 2015-11-29 ENCOUNTER — Encounter (HOSPITAL_COMMUNITY)
Admission: RE | Disposition: A | Discharge status: Home / Self Care | Payer: Self-pay | Source: Ambulatory Visit | Attending: Urology

## 2015-11-29 DIAGNOSIS — Z791 Long term (current) use of non-steroidal anti-inflammatories (NSAID): Secondary | ICD-10-CM

## 2015-11-29 DIAGNOSIS — Z79899 Other long term (current) drug therapy: Secondary | ICD-10-CM

## 2015-11-29 DIAGNOSIS — C61 Malignant neoplasm of prostate: Principal | ICD-10-CM | POA: Diagnosis present

## 2015-11-29 DIAGNOSIS — Z79891 Long term (current) use of opiate analgesic: Secondary | ICD-10-CM

## 2015-11-29 HISTORY — PX: ROBOT ASSISTED LAPAROSCOPIC RADICAL PROSTATECTOMY: SHX5141

## 2015-11-29 HISTORY — PX: LYMPHADENECTOMY: SHX5960

## 2015-11-29 LAB — TYPE AND SCREEN
ABO/RH(D): A POS
ANTIBODY SCREEN: NEGATIVE

## 2015-11-29 LAB — HEMOGLOBIN AND HEMATOCRIT, BLOOD
HCT: 37.4 % — ABNORMAL LOW (ref 39.0–52.0)
Hemoglobin: 12.2 g/dL — ABNORMAL LOW (ref 13.0–17.0)

## 2015-11-29 SURGERY — XI ROBOTIC ASSISTED LAPAROSCOPIC RADICAL PROSTATECTOMY LEVEL 3
Anesthesia: General

## 2015-11-29 MED ORDER — HYDROCODONE-ACETAMINOPHEN 10-325 MG PO TABS: 1.0000 | ORAL_TABLET | Freq: Four times a day (QID) | ORAL | 0 refills | Status: DC | PRN

## 2015-11-29 MED ORDER — HYDROMORPHONE HCL 1 MG/ML IJ SOLN
0.5000 mg | INTRAMUSCULAR | Status: DC | PRN
  Administered 2015-11-29: 1 mg via INTRAVENOUS
  Filled 2015-11-29: qty 1

## 2015-11-29 MED ORDER — EPHEDRINE 5 MG/ML INJ
INTRAVENOUS | Status: AC
Start: 2015-11-29 — End: 2015-11-29
  Filled 2015-11-29: qty 10

## 2015-11-29 MED ORDER — SODIUM CHLORIDE 0.9 % IV BOLUS (SEPSIS): 1000.0000 mL | Freq: Once | INTRAVENOUS | Status: DC

## 2015-11-29 MED ORDER — ONDANSETRON HCL 4 MG/2ML IJ SOLN
INTRAMUSCULAR | Status: AC
  Filled 2015-11-29: qty 2

## 2015-11-29 MED ORDER — LISINOPRIL 20 MG PO TABS
20.0000 mg | ORAL_TABLET | Freq: Every day | ORAL | Status: DC
  Administered 2015-11-30: 20 mg via ORAL
  Filled 2015-11-29: qty 1

## 2015-11-29 MED ORDER — CEFAZOLIN SODIUM-DEXTROSE 2-4 GM/100ML-% IV SOLN
2.0000 g | Freq: Three times a day (TID) | INTRAVENOUS | Status: AC
  Administered 2015-11-29 – 2015-11-30 (×2): 2 g via INTRAVENOUS
  Filled 2015-11-29 (×4): qty 100

## 2015-11-29 MED ORDER — BUPIVACAINE-EPINEPHRINE 0.25% -1:200000 IJ SOLN
INTRAMUSCULAR | Status: AC
  Filled 2015-11-29: qty 1

## 2015-11-29 MED ORDER — LISINOPRIL-HYDROCHLOROTHIAZIDE 20-12.5 MG PO TABS: 1.0000 | ORAL_TABLET | Freq: Every morning | ORAL | Status: DC

## 2015-11-29 MED ORDER — ONDANSETRON HCL 4 MG/2ML IJ SOLN
INTRAMUSCULAR | Status: DC | PRN
  Administered 2015-11-29: 4 mg via INTRAVENOUS

## 2015-11-29 MED ORDER — FENTANYL CITRATE (PF) 100 MCG/2ML IJ SOLN
INTRAMUSCULAR | Status: AC
  Filled 2015-11-29: qty 2

## 2015-11-29 MED ORDER — FENTANYL CITRATE (PF) 100 MCG/2ML IJ SOLN
INTRAMUSCULAR | Status: DC | PRN
  Administered 2015-11-29: 50 ug via INTRAVENOUS
  Administered 2015-11-29: 100 ug via INTRAVENOUS
  Administered 2015-11-29 (×2): 50 ug via INTRAVENOUS
  Administered 2015-11-29: 100 ug via INTRAVENOUS

## 2015-11-29 MED ORDER — OXYCODONE HCL 5 MG PO TABS
5.0000 mg | ORAL_TABLET | ORAL | Status: DC | PRN
  Administered 2015-11-29 – 2015-11-30 (×2): 5 mg via ORAL
  Filled 2015-11-29 (×2): qty 1

## 2015-11-29 MED ORDER — DEXAMETHASONE SODIUM PHOSPHATE 10 MG/ML IJ SOLN
INTRAMUSCULAR | Status: DC | PRN
  Administered 2015-11-29: 10 mg via INTRAVENOUS

## 2015-11-29 MED ORDER — DEXMEDETOMIDINE HCL IN NACL 200 MCG/50ML IV SOLN
INTRAVENOUS | Status: DC | PRN
  Administered 2015-11-29: .5 ug/kg/h via INTRAVENOUS

## 2015-11-29 MED ORDER — MIDAZOLAM HCL 2 MG/2ML IJ SOLN
INTRAMUSCULAR | Status: AC
  Filled 2015-11-29: qty 2

## 2015-11-29 MED ORDER — OXYBUTYNIN CHLORIDE 5 MG PO TABS: 5.0000 mg | ORAL_TABLET | Freq: Three times a day (TID) | ORAL | Status: DC | PRN

## 2015-11-29 MED ORDER — ACETAMINOPHEN 325 MG PO TABS: 650.0000 mg | ORAL_TABLET | ORAL | Status: DC | PRN

## 2015-11-29 MED ORDER — DEXMEDETOMIDINE HCL IN NACL 200 MCG/50ML IV SOLN
INTRAVENOUS | Status: AC
Start: 2015-11-29 — End: 2015-11-29
  Filled 2015-11-29: qty 50

## 2015-11-29 MED ORDER — PROMETHAZINE HCL 25 MG/ML IJ SOLN
INTRAMUSCULAR | Status: AC
  Filled 2015-11-29: qty 1

## 2015-11-29 MED ORDER — PROPOFOL 10 MG/ML IV BOLUS
INTRAVENOUS | Status: AC
  Filled 2015-11-29: qty 20

## 2015-11-29 MED ORDER — ROCURONIUM BROMIDE 50 MG/5ML IV SOSY
PREFILLED_SYRINGE | INTRAVENOUS | Status: AC
  Filled 2015-11-29: qty 10

## 2015-11-29 MED ORDER — EPHEDRINE SULFATE 50 MG/ML IJ SOLN
INTRAMUSCULAR | Status: DC | PRN
  Administered 2015-11-29: 10 mg via INTRAVENOUS

## 2015-11-29 MED ORDER — MIDAZOLAM HCL 5 MG/5ML IJ SOLN
INTRAMUSCULAR | Status: DC | PRN
  Administered 2015-11-29: 2 mg via INTRAVENOUS

## 2015-11-29 MED ORDER — FENTANYL CITRATE (PF) 250 MCG/5ML IJ SOLN
INTRAMUSCULAR | Status: AC
  Filled 2015-11-29: qty 5

## 2015-11-29 MED ORDER — HYDROCHLOROTHIAZIDE 12.5 MG PO CAPS
12.5000 mg | ORAL_CAPSULE | Freq: Every day | ORAL | Status: DC
  Administered 2015-11-30: 12.5 mg via ORAL
  Filled 2015-11-29: qty 1

## 2015-11-29 MED ORDER — LIDOCAINE 2% (20 MG/ML) 5 ML SYRINGE
INTRAMUSCULAR | Status: DC | PRN
  Administered 2015-11-29: 80 mg via INTRAVENOUS

## 2015-11-29 MED ORDER — LIDOCAINE 2% (20 MG/ML) 5 ML SYRINGE
INTRAMUSCULAR | Status: AC
  Filled 2015-11-29: qty 5

## 2015-11-29 MED ORDER — DIPHENHYDRAMINE HCL 50 MG/ML IJ SOLN: 12.5000 mg | Freq: Four times a day (QID) | INTRAMUSCULAR | Status: DC | PRN

## 2015-11-29 MED ORDER — SUGAMMADEX SODIUM 500 MG/5ML IV SOLN
INTRAVENOUS | Status: AC
Start: 2015-11-29 — End: 2015-11-29
  Filled 2015-11-29: qty 5

## 2015-11-29 MED ORDER — DEXTROSE-NACL 5-0.45 % IV SOLN
INTRAVENOUS | Status: DC
  Administered 2015-11-29 – 2015-11-30 (×2): via INTRAVENOUS

## 2015-11-29 MED ORDER — DIPHENHYDRAMINE HCL 12.5 MG/5ML PO ELIX: 12.5000 mg | ORAL_SOLUTION | Freq: Four times a day (QID) | ORAL | Status: DC | PRN

## 2015-11-29 MED ORDER — DOCUSATE SODIUM 100 MG PO CAPS
100.0000 mg | ORAL_CAPSULE | Freq: Two times a day (BID) | ORAL | Status: DC
  Administered 2015-11-30: 100 mg via ORAL
  Filled 2015-11-29 (×2): qty 1

## 2015-11-29 MED ORDER — SODIUM CHLORIDE 0.9 % IR SOLN
Status: DC | PRN
Start: 2015-11-29 — End: 2015-11-29
  Administered 2015-11-29: 1000 mL

## 2015-11-29 MED ORDER — BUPIVACAINE-EPINEPHRINE 0.25% -1:200000 IJ SOLN
INTRAMUSCULAR | Status: DC | PRN
  Administered 2015-11-29: 48 mL

## 2015-11-29 MED ORDER — HYDROMORPHONE HCL 1 MG/ML IJ SOLN
INTRAMUSCULAR | Status: AC
  Filled 2015-11-29: qty 1

## 2015-11-29 MED ORDER — ROCURONIUM BROMIDE 10 MG/ML (PF) SYRINGE
PREFILLED_SYRINGE | INTRAVENOUS | Status: DC | PRN
  Administered 2015-11-29: 50 mg via INTRAVENOUS
  Administered 2015-11-29 (×2): 20 mg via INTRAVENOUS
  Administered 2015-11-29: 30 mg via INTRAVENOUS
  Administered 2015-11-29: 20 mg via INTRAVENOUS

## 2015-11-29 MED ORDER — LACTATED RINGERS IV SOLN
INTRAVENOUS | Status: DC | PRN
  Administered 2015-11-29: 13:00:00

## 2015-11-29 MED ORDER — DEXAMETHASONE SODIUM PHOSPHATE 10 MG/ML IJ SOLN
INTRAMUSCULAR | Status: AC
  Filled 2015-11-29: qty 1

## 2015-11-29 MED ORDER — ACETAMINOPHEN 500 MG PO TABS
1000.0000 mg | ORAL_TABLET | Freq: Four times a day (QID) | ORAL | Status: DC
  Administered 2015-11-29 – 2015-11-30 (×3): 1000 mg via ORAL
  Filled 2015-11-29 (×3): qty 2

## 2015-11-29 MED ORDER — LACTATED RINGERS IV SOLN
INTRAVENOUS | Status: DC
  Administered 2015-11-29: 1000 mL via INTRAVENOUS
  Administered 2015-11-29: 14:00:00 via INTRAVENOUS

## 2015-11-29 MED ORDER — PROMETHAZINE HCL 25 MG/ML IJ SOLN
6.2500 mg | INTRAMUSCULAR | Status: DC | PRN
  Administered 2015-11-29: 6.25 mg via INTRAVENOUS

## 2015-11-29 MED ORDER — ONDANSETRON HCL 4 MG/2ML IJ SOLN: 4.0000 mg | INTRAMUSCULAR | Status: DC | PRN

## 2015-11-29 MED ORDER — ROCURONIUM BROMIDE 50 MG/5ML IV SOSY
PREFILLED_SYRINGE | INTRAVENOUS | Status: AC
  Filled 2015-11-29: qty 5

## 2015-11-29 MED ORDER — PROPOFOL 10 MG/ML IV BOLUS
INTRAVENOUS | Status: DC | PRN
  Administered 2015-11-29: 300 mg via INTRAVENOUS

## 2015-11-29 MED ORDER — HYDROMORPHONE HCL 1 MG/ML IJ SOLN
0.2500 mg | INTRAMUSCULAR | Status: DC | PRN
  Administered 2015-11-29: 0.25 mg via INTRAVENOUS
  Administered 2015-11-29 (×2): 0.5 mg via INTRAVENOUS

## 2015-11-29 MED ORDER — SENNA 8.6 MG PO TABS
1.0000 | ORAL_TABLET | Freq: Two times a day (BID) | ORAL | Status: DC
  Administered 2015-11-30: 8.6 mg via ORAL
  Filled 2015-11-29 (×2): qty 1

## 2015-11-29 MED ORDER — SULFAMETHOXAZOLE-TRIMETHOPRIM 800-160 MG PO TABS: 1.0000 | ORAL_TABLET | Freq: Two times a day (BID) | ORAL | 0 refills | Status: DC

## 2015-11-29 MED ORDER — STERILE WATER FOR IRRIGATION IR SOLN
Status: DC | PRN
  Administered 2015-11-29: 1000 mL

## 2015-11-29 MED ORDER — SUGAMMADEX SODIUM 200 MG/2ML IV SOLN
INTRAVENOUS | Status: DC | PRN
  Administered 2015-11-29: 300 mg via INTRAVENOUS

## 2015-11-29 MED ORDER — HEPARIN SODIUM (PORCINE) 1000 UNIT/ML IJ SOLN
INTRAMUSCULAR | Status: AC
  Filled 2015-11-29: qty 1

## 2015-11-29 SURGICAL SUPPLY — 54 items
ADH SKN CLS APL DERMABOND .7 (GAUZE/BANDAGES/DRESSINGS)
APPLICATOR COTTON TIP 6IN STRL (MISCELLANEOUS) ×4 IMPLANT
CATH FOLEY 2WAY SLVR 18FR 30CC (CATHETERS) ×4 IMPLANT
CATH ROBINSON RED A/P 16FR (CATHETERS) ×4 IMPLANT
CATH ROBINSON RED A/P 8FR (CATHETERS) ×4 IMPLANT
CATH TIEMANN FOLEY 18FR 5CC (CATHETERS) ×4 IMPLANT
CHLORAPREP W/TINT 26ML (MISCELLANEOUS) ×4 IMPLANT
CLIP LIGATING HEM O LOK PURPLE (MISCELLANEOUS) ×8 IMPLANT
COVER SURGICAL LIGHT HANDLE (MISCELLANEOUS) ×4 IMPLANT
COVER TIP SHEARS 8 DVNC (MISCELLANEOUS) ×2 IMPLANT
COVER TIP SHEARS 8MM DA VINCI (MISCELLANEOUS) ×2
CUTTER ECHEON FLEX ENDO 45 340 (ENDOMECHANICALS) ×4 IMPLANT
DECANTER SPIKE VIAL GLASS SM (MISCELLANEOUS) ×4 IMPLANT
DERMABOND ADVANCED (GAUZE/BANDAGES/DRESSINGS)
DERMABOND ADVANCED .7 DNX12 (GAUZE/BANDAGES/DRESSINGS) IMPLANT
DRAPE ARM DVNC X/XI (DISPOSABLE) ×8 IMPLANT
DRAPE COLUMN DVNC XI (DISPOSABLE) ×2 IMPLANT
DRAPE DA VINCI XI ARM (DISPOSABLE) ×8
DRAPE DA VINCI XI COLUMN (DISPOSABLE) ×2
DRAPE SURG IRRIG POUCH 19X23 (DRAPES) ×4 IMPLANT
DRSG TEGADERM 4X4.75 (GAUZE/BANDAGES/DRESSINGS) ×4 IMPLANT
ELECT REM PT RETURN 9FT ADLT (ELECTROSURGICAL) ×4
ELECTRODE REM PT RTRN 9FT ADLT (ELECTROSURGICAL) ×2 IMPLANT
GLOVE BIO SURGEON STRL SZ 6.5 (GLOVE) ×3 IMPLANT
GLOVE BIO SURGEONS STRL SZ 6.5 (GLOVE) ×1
GLOVE BIOGEL M STRL SZ7.5 (GLOVE) ×8 IMPLANT
GOWN STRL REUS W/TWL LRG LVL3 (GOWN DISPOSABLE) ×16 IMPLANT
HOLDER FOLEY CATH W/STRAP (MISCELLANEOUS) ×4 IMPLANT
IRRIG SUCT STRYKERFLOW 2 WTIP (MISCELLANEOUS) ×4
IRRIGATION SUCT STRKRFLW 2 WTP (MISCELLANEOUS) ×2 IMPLANT
IV LACTATED RINGERS 1000ML (IV SOLUTION) ×2 IMPLANT
NDL SAFETY ECLIPSE 18X1.5 (NEEDLE) ×2 IMPLANT
NEEDLE HYPO 18GX1.5 SHARP (NEEDLE) ×4
PACK ROBOT UROLOGY CUSTOM (CUSTOM PROCEDURE TRAY) ×4 IMPLANT
RELOAD GREEN ECHELON 45 (STAPLE) ×4 IMPLANT
SEAL CANN UNIV 5-8 DVNC XI (MISCELLANEOUS) ×8 IMPLANT
SEAL XI 5MM-8MM UNIVERSAL (MISCELLANEOUS) ×8
SOLUTION ELECTROLUBE (MISCELLANEOUS) ×4 IMPLANT
SUT ETHILON 3 0 PS 1 (SUTURE) ×4 IMPLANT
SUT MNCRL 3 0 RB1 (SUTURE) ×2 IMPLANT
SUT MNCRL 3 0 VIOLET RB1 (SUTURE) ×2 IMPLANT
SUT MNCRL AB 4-0 PS2 18 (SUTURE) ×8 IMPLANT
SUT MONOCRYL 3 0 RB1 (SUTURE) ×4
SUT VIC AB 0 CT1 27 (SUTURE) ×4
SUT VIC AB 0 CT1 27XBRD ANTBC (SUTURE) ×2 IMPLANT
SUT VIC AB 0 UR5 27 (SUTURE) ×4 IMPLANT
SUT VIC AB 2-0 SH 27 (SUTURE) ×4
SUT VIC AB 2-0 SH 27X BRD (SUTURE) ×2 IMPLANT
SUT VICRYL 0 UR6 27IN ABS (SUTURE) ×8 IMPLANT
SYR 27GX1/2 1ML LL SAFETY (SYRINGE) ×4 IMPLANT
TOWEL OR 17X26 10 PK STRL BLUE (TOWEL DISPOSABLE) ×4 IMPLANT
TOWEL OR NON WOVEN STRL DISP B (DISPOSABLE) ×4 IMPLANT
TUBING INSUFFLATION 10FT LAP (TUBING) IMPLANT
WATER STERILE IRR 1500ML POUR (IV SOLUTION) ×4 IMPLANT

## 2015-11-29 NOTE — Transfer of Care (Signed)
Immediate Anesthesia Transfer of Care Note  Patient: Andrew Haley  Procedure(s) Performed: Procedure(s): XI ROBOTIC ASSISTED LAPAROSCOPIC RADICAL PROSTATECTOMY LEVEL 3 (N/A) LYMPHADENECTOMY (Bilateral)  Patient Location: PACU  Anesthesia Type:General  Level of Consciousness:  sedated, patient cooperative and responds to stimulation  Airway & Oxygen Therapy:Patient Spontanous Breathing and Patient connected to face mask oxgen  Post-op Assessment:  Report given to PACU RN and Post -op Vital signs reviewed and stable  Post vital signs:  Reviewed and stable  Last Vitals:  Vitals:   11/29/15 0901  BP: (!) 152/103  Pulse: 80  Resp: 18  Temp: 123XX123 C    Complications: No apparent anesthesia complications

## 2015-11-29 NOTE — Interval H&P Note (Signed)
History and Physical Interval Note:  11/29/2015 10:21 AM  Andrew Haley  has presented today for surgery, with the diagnosis of PROSTATE CANCER  The various methods of treatment have been discussed with the patient and family. After consideration of risks, benefits and other options for treatment, the patient has consented to  Procedure(s): XI ROBOTIC ASSISTED LAPAROSCOPIC RADICAL PROSTATECTOMY LEVEL 3 (N/A) LYMPHADENECTOMY (Bilateral) as a surgical intervention .  The patient's history has been reviewed, patient examined, no change in status, stable for surgery.  I have reviewed the patient's chart and labs.  Questions were answered to the patient's satisfaction.     Shivansh Hardaway,LES

## 2015-11-29 NOTE — Progress Notes (Signed)
Patient ID: Andrew Haley, male   DOB: [birth year], 50 y.o.   MRN: [id number]  Post-op note  Subjective: The patient is doing well.  No complaints.  Objective: Vital signs in last 24 hours: Temp:  [97.6 F (36.4 C)-98.8 F (37.1 C)] 98.8 F (37.1 C) (10/19 1614) Pulse Rate:  [78-93] 88 (10/19 1614) Resp:  [14-26] 18 (10/19 1614) BP: (128-152)/(70-103) 148/80 (10/19 1614) SpO2:  [93 %-100 %] 98 % (10/19 1614) Weight:  [142.9 kg (315 lb)] 142.9 kg (315 lb) (10/19 0859)  Intake/Output from previous day: No intake/output data recorded. Intake/Output this shift: Total I/O In: 2050 [I.V.:2050] Out: 300 [Urine:100; Drains:50; Blood:150]  Physical Exam:  General: Alert and oriented. Abdomen: Soft, Nondistended. Incisions: Clean and dry. GU: Urine clearing  Lab Results:  Recent Labs  11/28/15 0840 11/29/15 1450  HGB 12.6* 12.2*  HCT 39.8 37.4*    Assessment/Plan: POD#0   1) Continue to monitor, ambulate, IS   Pryor Curia. MD   LOS: 0 days   Genesi Stefanko,LES 11/29/2015, 5:50 PM

## 2015-11-29 NOTE — Discharge Instructions (Signed)

## 2015-11-29 NOTE — Discharge Summary (Signed)
Physician Discharge Summary   Date of admission: 11/29/2015  Date of discharge: 11/30/2015  Admission diagnosis: Prostate Cancer  Discharge diagnosis: Prostate Cancer  History and Physical: For full details, please see admission history and physical. Briefly, Andrew Haley is a 50 y.o. gentleman with localized prostate cancer.  After discussing management/treatment options, he elected to proceed with surgical treatment.  Hospital Course: Andrew Haley was taken to the operating room on 11/29/2015 and underwent a robotic assisted laparoscopic radical prostatectomy. He tolerated this procedure well and without complications. Postoperatively, he was able to be transferred to a regular hospital room following recovery from anesthesia.  He was able to begin ambulating the night of surgery. He remained hemodynamically stable overnight.  He had excellent urine output with appropriately minimal output from his pelvic drain and his pelvic drain was removed on POD #1.  He was transitioned to oral pain medication, tolerated a clear liquid diet, and had met all discharge criteria and was able to be discharged home later on POD#1.  Laboratory values:  Recent Labs  11/28/15 0840 11/29/15 1450 11/30/15 0548  HGB 12.6* 12.2* 11.8*  HCT 39.8 37.4* 36.8*    Disposition: Home  Discharge instruction: He was instructed to be ambulatory but to refrain from heavy lifting, strenuous activity, or driving. He was instructed on urethral catheter care.  Discharge medications:    Medication List    STOP taking these medications   diclofenac 75 MG EC tablet Commonly known as:  VOLTAREN   DSS 100 MG Caps   rivaroxaban 10 MG Tabs tablet Commonly known as:  XARELTO     TAKE these medications   diazepam 2 MG tablet Commonly known as:  VALIUM Take 1 tablet (2 mg total) by mouth every 6 (six) hours as needed for anxiety.   ferrous sulfate 325 (65 FE) MG tablet Take 1 tablet (325 mg total) by mouth 3 (three)  times daily after meals.   HYDROcodone-acetaminophen 10-325 MG tablet Commonly known as:  NORCO Take 1 tablet by mouth every 6 (six) hours as needed for moderate pain or severe pain. What changed:  reasons to take this   lisinopril-hydrochlorothiazide 20-12.5 MG tablet Commonly known as:  PRINZIDE,ZESTORETIC Take 1 tablet by mouth every morning.   sulfamethoxazole-trimethoprim 800-160 MG tablet Commonly known as:  BACTRIM DS,SEPTRA DS Take 1 tablet by mouth 2 (two) times daily. Start the day prior to foley removal appointment       Followup: He will followup in 1 week for catheter removal and to discuss his surgical pathology results.

## 2015-11-29 NOTE — Op Note (Signed)
Preoperative diagnosis: Clinically localized adenocarcinoma of the prostate (clinical stage T1c Nx Mx)  Postoperative diagnosis: Clinically localized adenocarcinoma of the prostate (clinical stage T1c Nx Mx)  Procedure:  1. Robotic assisted laparoscopic radical prostatectomy (bilateral nerve sparing) 2. Bilateral robotic assisted laparoscopic pelvic lymphadenectomy  Surgeon: Pryor Curia. M.D.  Assistant: Debbrah Alar, PA-C  An assistant was required for this surgical procedure.  The duties of the assistant included but were not limited to suctioning, passing suture, camera manipulation, retraction. This procedure would not be able to be performed without an Environmental consultant.  Resident: Dr. Cleotis Lema  Anesthesia: General  Complications: None  EBL: 150 mL  IVF:  1700 mL crystalloid  Specimens: 1. Prostate and seminal vesicles 2. Right pelvic lymph nodes 3. Left pelvic lymph nodes  Disposition of specimens: Pathology  Drains: 1. 20 Fr coude catheter 2. # 19 Blake pelvic drain  Indication: Andrew Haley is a 50 y.o. year old patient with clinically localized prostate cancer.  After a thorough review of the management options for treatment of prostate cancer, he elected to proceed with surgical therapy and the above procedure(s).  We have discussed the potential benefits and risks of the procedure, side effects of the proposed treatment, the likelihood of the patient achieving the goals of the procedure, and any potential problems that might occur during the procedure or recuperation. Informed consent has been obtained.  Description of procedure:  The patient was taken to the operating room and a general anesthetic was administered. He was given preoperative antibiotics, placed in the dorsal lithotomy position, and prepped and draped in the usual sterile fashion. Next a preoperative timeout was performed. A urethral catheter was placed into the bladder and a site was selected  near the umbilicus for placement of the camera port. This was placed using a standard open Hassan technique which allowed entry into the peritoneal cavity under direct vision and without difficulty. An 8 mm robotic port was placed and a pneumoperitoneum established. The camera was then used to inspect the abdomen and there was no evidence of any intra-abdominal injuries or other abnormalities. The remaining abdominal ports were then placed. 8 mm robotic ports were placed in the right lower quadrant, left lower quadrant, and far left lateral abdominal wall. A 5 mm port was placed in the right upper quadrant and a 12 mm port was placed in the right lateral abdominal wall for laparoscopic assistance. All ports were placed under direct vision without difficulty. The surgical cart was then docked.   Utilizing the cautery scissors, the bladder was reflected posteriorly allowing entry into the space of Retzius and identification of the endopelvic fascia and prostate. The periprostatic fat was then removed from the prostate allowing full exposure of the endopelvic fascia. The endopelvic fascia was then incised from the apex back to the base of the prostate bilaterally and the underlying levator muscle fibers were swept laterally off the prostate thereby isolating the dorsal venous complex. The dorsal vein was then stapled and divided with a 45 mm Flex Echelon stapler. Attention then turned to the bladder neck which was divided anteriorly thereby allowing entry into the bladder and exposure of the urethral catheter. The catheter balloon was deflated and the catheter was brought into the operative field and used to retract the prostate anteriorly. The posterior bladder neck was then examined and was divided allowing further dissection between the bladder and prostate posteriorly until the vasa deferentia and seminal vessels were identified. The vasa deferentia were  isolated, divided, and lifted anteriorly. The seminal  vesicles were dissected down to their tips with care to control the seminal vascular arterial blood supply. These structures were then lifted anteriorly and the space between Denonvillier's fascia and the anterior rectum was developed with a combination of sharp and blunt dissection. This isolated the vascular pedicles of the prostate.  The lateral prostatic fascia was then sharply incised allowing release of the neurovascular bundles bilaterally. The vascular pedicles of the prostate were then ligated with Weck clips between the prostate and neurovascular bundles and divided with sharp cold scissor dissection resulting in neurovascular bundle preservation. The neurovascular bundles were then separated off the apex of the prostate and urethra bilaterally.  The urethra was then sharply transected allowing the prostate specimen to be disarticulated. The pelvis was copiously irrigated and hemostasis was ensured. There was no evidence for rectal injury.  Attention then turned to the right pelvic sidewall. The fibrofatty tissue between the external iliac vein, confluence of the iliac vessels, hypogastric artery, and Cooper's ligament was dissected free from the pelvic sidewall with care to preserve the obturator nerve. Weck clips were used for lymphostasis and hemostasis. An identical procedure was performed on the contralateral side and the lymphatic packets were removed for permanent pathologic analysis.  Attention then turned to the urethral anastomosis. A 2-0 Vicryl slip knot was placed between Denonvillier's fascia, the posterior bladder neck, and the posterior urethra to reapproximate these structures. A double-armed 3-0 Monocryl suture was then used to perform a 360 running tension-free anastomosis between the bladder neck and urethra. A new urethral catheter was then placed into the bladder and irrigated. There were no blood clots within the bladder and the anastomosis appeared to be watertight. A #19  Blake drain was then brought through the left lateral 8 mm port site and positioned appropriately within the pelvis. It was secured to the skin with a nylon suture. The surgical cart was then undocked. The right lateral 12 mm port site was closed at the fascial level with a 0 Vicryl suture placed laparoscopically. All remaining ports were then removed under direct vision. The prostate specimen was removed intact within the Endopouch retrieval bag via the periumbilical camera port site. This fascial opening was closed with two running 0 Vicryl sutures. 0.25% Marcaine was then injected into all port sites and all incisions were reapproximated at the skin level with 4-0 Monocryl subcuticular sutures and Liquiband. The patient appeared to tolerate the procedure well and without complications. The patient was able to be extubated and transferred to the recovery unit in satisfactory condition.   Pryor Curia MD

## 2015-11-29 NOTE — Anesthesia Procedure Notes (Signed)
Procedure Name: Intubation Date/Time: 11/29/2015 11:15 AM Performed by: Talbot Grumbling Pre-anesthesia Checklist: Patient identified, Emergency Drugs available, Suction available and Patient being monitored Patient Re-evaluated:Patient Re-evaluated prior to inductionOxygen Delivery Method: Circle system utilized Preoxygenation: Pre-oxygenation with 100% oxygen Intubation Type: IV induction Ventilation: Mask ventilation without difficulty Laryngoscope Size: Mac and 4 Grade View: Grade II Tube type: Oral Tube size: 8.0 mm Number of attempts: 1 Airway Equipment and Method: Stylet Placement Confirmation: ETT inserted through vocal cords under direct vision,  positive ETCO2 and breath sounds checked- equal and bilateral Secured at: 23 cm Tube secured with: Tape Dental Injury: Teeth and Oropharynx as per pre-operative assessment

## 2015-11-29 NOTE — Anesthesia Postprocedure Evaluation (Signed)
Anesthesia Post Note  Patient: Andrew Haley  Procedure(s) Performed: Procedure(s) (LRB): XI ROBOTIC ASSISTED LAPAROSCOPIC RADICAL PROSTATECTOMY LEVEL 3 (N/A) LYMPHADENECTOMY (Bilateral)  Patient location during evaluation: PACU Anesthesia Type: General Level of consciousness: awake and alert Pain management: pain level controlled Vital Signs Assessment: post-procedure vital signs reviewed and stable Respiratory status: spontaneous breathing, nonlabored ventilation, respiratory function stable and patient connected to nasal cannula oxygen Cardiovascular status: blood pressure returned to baseline and stable Postop Assessment: no signs of nausea or vomiting Anesthetic complications: no    Last Vitals:  Vitals:   11/29/15 1530 11/29/15 1545  BP: (!) 145/78 (!) 148/118  Pulse: 90 93  Resp: (!) 22 17  Temp: 36.5 C     Last Pain:  Vitals:   11/29/15 1545  TempSrc:   PainSc: Asleep                 Montez Hageman

## 2015-11-29 NOTE — Progress Notes (Signed)
UROLOGY PROGRESS NOTES  Assessment/Plan: Andrew Haley is a 50 y.o. male with a history of arthritis, HTN, prostate cancer s/p RALP 11/29/15 with Dr. Alinda Money.   Interval/Plan: Saline lock, continue clear liquid diet, OOB, POD1 suppository. JP removal now. AFVSS.  Neuro: - pain control with PRN tylenol, oxycodone, dilaudid - PRN benadryl  Respiratory: SORA  CV: HDS - continue home lisinopril-HCTZ 20-12.5mg  qd  FEN/GI: - saline lock, replete lytes prn, clear liquid diet - bowel regimen with colace/senna - PRN zofran  GU: - monitor UOP - Foley catheter in place - oxybutynin 5mg  TID PRN  Heme/ID: - afebrile, stable - Hb 11.8, continue to monitor - 2 doses of postop Ancef  PPx: OOB/IS  Dispo: Floor status   Subjective: + flatus. No n/v. Pain controlled.   Objective:  Vital signs in last 24 hours: Temp:  [97.6 F (36.4 C)-98.8 F (37.1 C)] 97.7 F (36.5 C) (10/20 0400) Pulse Rate:  [78-97] 78 (10/20 0400) Resp:  [14-26] 16 (10/20 0400) BP: (124-152)/(70-103) 152/89 (10/20 0400) SpO2:  [93 %-100 %] 96 % (10/20 0400) Weight:  [315 lb (142.9 kg)] 315 lb (142.9 kg) (10/19 0859)  10/19 0701 - 10/20 0700 In: 4532.5 [P.O.:800; I.V.:3532.5; IV Piggyback:200] Out: 3 [Urine:4200; Drains:130; Blood:150]    Physical Exam:  General:  well-developed and well-nourished male in NAD, lying in bed, alert & oriented, pleasant HEENT: [place]/AT, EOMI, sclera anicteric, hearing grossly intact, no nasal discharge, MMM Respiratory: nonlabored respirations, satting well on RA, symmetrical chest rise Cardiovascular: pulse regular rate & rhythm Abdominal: soft, NTTP, nondistended, surgical incisions c/d/i without signs of exudate/erythema GU: Foley catheter draining yellow urine Extremities: warm, well-perfused, no c/c/e Neuro: no focal deficits   Data Review: Results for orders placed or performed during the hospital encounter of 11/29/15 (from the past 24 hour(s))  Hemoglobin  and hematocrit, blood     Status: Abnormal   Collection Time: 11/29/15  2:50 PM  Result Value Ref Range   Hemoglobin 12.2 (L) 13.0 - 17.0 g/dL   HCT 37.4 (L) 39.0 - 52.0 %  Hemoglobin and hematocrit, blood     Status: Abnormal   Collection Time: 11/30/15  5:48 AM  Result Value Ref Range   Hemoglobin 11.8 (L) 13.0 - 17.0 g/dL   HCT 36.8 (L) 39.0 - 52.0 %    Imaging: None

## 2015-11-29 NOTE — Anesthesia Preprocedure Evaluation (Signed)
Anesthesia Evaluation  Patient identified by MRN, date of birth, ID band Patient awake    Reviewed: Allergy & Precautions, NPO status , Patient's Chart, lab work & pertinent test results  Airway Mallampati: II  TM Distance: >3 FB Neck ROM: Full    Dental  (+) Dental Advisory Given   Pulmonary neg pulmonary ROS,    breath sounds clear to auscultation       Cardiovascular hypertension, Pt. on medications  Rhythm:Regular Rate:Normal     Neuro/Psych negative neurological ROS     GI/Hepatic negative GI ROS, Neg liver ROS,   Endo/Other  negative endocrine ROS  Renal/GU negative Renal ROS     Musculoskeletal  (+) Arthritis ,   Abdominal   Peds  Hematology  (+) anemia ,   Anesthesia Other Findings   Reproductive/Obstetrics                             Lab Results  Component Value Date   WBC 7.4 11/28/2015   HGB 12.6 (L) 11/28/2015   HCT 39.8 11/28/2015   MCV 84.5 11/28/2015   PLT 248 11/28/2015   Lab Results  Component Value Date   CREATININE 0.84 11/28/2015   BUN 15 11/28/2015   NA 138 11/28/2015   K 4.0 11/28/2015   CL 100 (L) 11/28/2015   CO2 28 11/28/2015    Anesthesia Physical Anesthesia Plan  ASA: II  Anesthesia Plan: General   Post-op Pain Management:    Induction: Intravenous  Airway Management Planned: Oral ETT  Additional Equipment:   Intra-op Plan:   Post-operative Plan: Extubation in OR  Informed Consent: I have reviewed the patients History and Physical, chart, labs and discussed the procedure including the risks, benefits and alternatives for the proposed anesthesia with the patient or authorized representative who has indicated his/her understanding and acceptance.   Dental advisory given  Plan Discussed with: CRNA  Anesthesia Plan Comments:         Anesthesia Quick Evaluation

## 2015-11-30 LAB — HEMOGLOBIN AND HEMATOCRIT, BLOOD
HCT: 36.8 % — ABNORMAL LOW (ref 39.0–52.0)
Hemoglobin: 11.8 g/dL — ABNORMAL LOW (ref 13.0–17.0)

## 2015-11-30 MED ORDER — HYDROCODONE-ACETAMINOPHEN 5-325 MG PO TABS
1.0000 | ORAL_TABLET | Freq: Four times a day (QID) | ORAL | Status: DC | PRN
Start: 2015-11-30 — End: 2015-11-30
  Administered 2015-11-30: 1 via ORAL
  Filled 2015-11-30: qty 1

## 2015-11-30 MED ORDER — BISACODYL 10 MG RE SUPP
10.0000 mg | Freq: Once | RECTAL | Status: AC
  Administered 2015-11-30: 10 mg via RECTAL
  Filled 2015-11-30: qty 1

## 2015-11-30 MED ORDER — BISACODYL 10 MG RE SUPP
10.0000 mg | Freq: Once | RECTAL | Status: AC
  Filled 2015-11-30: qty 1

## 2015-11-30 NOTE — Care Management Note (Signed)
Case Management Note  Patient Details  Name: Andrew Haley MRN: [id number] Date of Birth: [birth year]  Subjective/Objective:                    Action/Plan:d/c home no needs or orders.   Expected Discharge Date:                  Expected Discharge Plan:  Home/Self Care  In-House Referral:     Discharge planning Services  CM Consult  Post Acute Care Choice:    Choice offered to:     DME Arranged:    DME Agency:     HH Arranged:    [place] Agency:     Status of Service:  Completed, signed off  If discussed at [employer] of Stay Meetings, dates discussed:    Additional Comments:  Dessa Phi, RN 11/30/2015, 12:17 PM

## 2015-11-30 NOTE — Plan of Care (Signed)
Problem: Activity: Goal: Risk for activity intolerance will decrease Outcome: Progressing Ambulated 150 ft.

## 2015-11-30 NOTE — Progress Notes (Signed)
JP drain removed, pt tolerated well

## 2015-11-30 NOTE — Progress Notes (Signed)
Patient remains alert and oriented x4, ambulatory without assist. Pt has ambulated once per hour. Diet has been tolerated well. Discharge/foley care instructions reviewed with pt. Questions and concerns were denied. Overnight bag, leg bag, alcohol gauze and tape  supplies sent

## 2015-12-31 DIAGNOSIS — M6281 Muscle weakness (generalized): Secondary | ICD-10-CM | POA: Diagnosis not present

## 2015-12-31 DIAGNOSIS — R278 Other lack of coordination: Secondary | ICD-10-CM | POA: Diagnosis not present

## 2015-12-31 DIAGNOSIS — N393 Stress incontinence (female) (male): Secondary | ICD-10-CM | POA: Diagnosis not present

## 2016-01-29 DIAGNOSIS — N393 Stress incontinence (female) (male): Secondary | ICD-10-CM | POA: Diagnosis not present

## 2016-01-29 DIAGNOSIS — M6281 Muscle weakness (generalized): Secondary | ICD-10-CM | POA: Diagnosis not present

## 2016-01-29 DIAGNOSIS — M62838 Other muscle spasm: Secondary | ICD-10-CM | POA: Diagnosis not present

## 2016-02-13 DIAGNOSIS — C61 Malignant neoplasm of prostate: Secondary | ICD-10-CM | POA: Diagnosis not present

## 2016-02-13 DIAGNOSIS — I10 Essential (primary) hypertension: Secondary | ICD-10-CM | POA: Diagnosis not present

## 2016-02-13 DIAGNOSIS — Z23 Encounter for immunization: Secondary | ICD-10-CM | POA: Diagnosis not present

## 2016-02-13 DIAGNOSIS — M1991 Primary osteoarthritis, unspecified site: Secondary | ICD-10-CM | POA: Diagnosis not present

## 2016-02-13 DIAGNOSIS — Z6841 Body Mass Index (BMI) 40.0 and over, adult: Secondary | ICD-10-CM | POA: Diagnosis not present

## 2016-02-13 DIAGNOSIS — J329 Chronic sinusitis, unspecified: Secondary | ICD-10-CM | POA: Diagnosis not present

## 2016-02-13 DIAGNOSIS — G894 Chronic pain syndrome: Secondary | ICD-10-CM | POA: Diagnosis not present

## 2016-02-13 DIAGNOSIS — Z1389 Encounter for screening for other disorder: Secondary | ICD-10-CM | POA: Diagnosis not present

## 2016-02-22 DIAGNOSIS — M546 Pain in thoracic spine: Secondary | ICD-10-CM | POA: Diagnosis not present

## 2016-02-22 DIAGNOSIS — M5412 Radiculopathy, cervical region: Secondary | ICD-10-CM | POA: Diagnosis not present

## 2016-02-22 DIAGNOSIS — M9902 Segmental and somatic dysfunction of thoracic region: Secondary | ICD-10-CM | POA: Diagnosis not present

## 2016-02-22 DIAGNOSIS — M9901 Segmental and somatic dysfunction of cervical region: Secondary | ICD-10-CM | POA: Diagnosis not present

## 2016-02-25 DIAGNOSIS — M5412 Radiculopathy, cervical region: Secondary | ICD-10-CM | POA: Diagnosis not present

## 2016-02-25 DIAGNOSIS — M546 Pain in thoracic spine: Secondary | ICD-10-CM | POA: Diagnosis not present

## 2016-02-25 DIAGNOSIS — M9901 Segmental and somatic dysfunction of cervical region: Secondary | ICD-10-CM | POA: Diagnosis not present

## 2016-02-25 DIAGNOSIS — M9902 Segmental and somatic dysfunction of thoracic region: Secondary | ICD-10-CM | POA: Diagnosis not present

## 2016-02-27 DIAGNOSIS — M5412 Radiculopathy, cervical region: Secondary | ICD-10-CM | POA: Diagnosis not present

## 2016-02-27 DIAGNOSIS — M546 Pain in thoracic spine: Secondary | ICD-10-CM | POA: Diagnosis not present

## 2016-02-27 DIAGNOSIS — M9902 Segmental and somatic dysfunction of thoracic region: Secondary | ICD-10-CM | POA: Diagnosis not present

## 2016-02-27 DIAGNOSIS — M9901 Segmental and somatic dysfunction of cervical region: Secondary | ICD-10-CM | POA: Diagnosis not present

## 2016-03-03 DIAGNOSIS — M546 Pain in thoracic spine: Secondary | ICD-10-CM | POA: Diagnosis not present

## 2016-03-03 DIAGNOSIS — M9901 Segmental and somatic dysfunction of cervical region: Secondary | ICD-10-CM | POA: Diagnosis not present

## 2016-03-03 DIAGNOSIS — M9902 Segmental and somatic dysfunction of thoracic region: Secondary | ICD-10-CM | POA: Diagnosis not present

## 2016-03-03 DIAGNOSIS — M5412 Radiculopathy, cervical region: Secondary | ICD-10-CM | POA: Diagnosis not present

## 2016-03-14 DIAGNOSIS — N5201 Erectile dysfunction due to arterial insufficiency: Secondary | ICD-10-CM | POA: Diagnosis not present

## 2016-03-14 DIAGNOSIS — N393 Stress incontinence (female) (male): Secondary | ICD-10-CM | POA: Diagnosis not present

## 2016-03-14 DIAGNOSIS — C61 Malignant neoplasm of prostate: Secondary | ICD-10-CM | POA: Diagnosis not present

## 2016-05-05 DIAGNOSIS — Z6841 Body Mass Index (BMI) 40.0 and over, adult: Secondary | ICD-10-CM | POA: Diagnosis not present

## 2016-05-05 DIAGNOSIS — M5412 Radiculopathy, cervical region: Secondary | ICD-10-CM | POA: Diagnosis not present

## 2016-05-05 DIAGNOSIS — G894 Chronic pain syndrome: Secondary | ICD-10-CM | POA: Diagnosis not present

## 2016-05-05 DIAGNOSIS — I10 Essential (primary) hypertension: Secondary | ICD-10-CM | POA: Diagnosis not present

## 2016-05-06 ENCOUNTER — Encounter (HOSPITAL_COMMUNITY): Payer: Self-pay | Admitting: Emergency Medicine

## 2016-05-06 ENCOUNTER — Emergency Department (HOSPITAL_COMMUNITY)
Admission: EM | Admit: 2016-05-06 | Discharge: 2016-05-06 | Disposition: A | Discharge status: Home / Self Care | Payer: Commercial Managed Care - HMO | Attending: Emergency Medicine | Admitting: Emergency Medicine

## 2016-05-06 ENCOUNTER — Emergency Department (HOSPITAL_COMMUNITY): Payer: Commercial Managed Care - HMO

## 2016-05-06 DIAGNOSIS — I10 Essential (primary) hypertension: Secondary | ICD-10-CM | POA: Diagnosis not present

## 2016-05-06 DIAGNOSIS — Y929 Unspecified place or not applicable: Secondary | ICD-10-CM | POA: Diagnosis not present

## 2016-05-06 DIAGNOSIS — M79641 Pain in right hand: Secondary | ICD-10-CM | POA: Diagnosis not present

## 2016-05-06 DIAGNOSIS — S82832A Other fracture of upper and lower end of left fibula, initial encounter for closed fracture: Secondary | ICD-10-CM | POA: Insufficient documentation

## 2016-05-06 DIAGNOSIS — Y9389 Activity, other specified: Secondary | ICD-10-CM | POA: Diagnosis not present

## 2016-05-06 DIAGNOSIS — S82831A Other fracture of upper and lower end of right fibula, initial encounter for closed fracture: Secondary | ICD-10-CM | POA: Diagnosis not present

## 2016-05-06 DIAGNOSIS — M25572 Pain in left ankle and joints of left foot: Secondary | ICD-10-CM | POA: Diagnosis not present

## 2016-05-06 DIAGNOSIS — Y999 Unspecified external cause status: Secondary | ICD-10-CM | POA: Insufficient documentation

## 2016-05-06 DIAGNOSIS — Z8546 Personal history of malignant neoplasm of prostate: Secondary | ICD-10-CM | POA: Insufficient documentation

## 2016-05-06 DIAGNOSIS — E669 Obesity, unspecified: Secondary | ICD-10-CM | POA: Diagnosis not present

## 2016-05-06 DIAGNOSIS — G5621 Lesion of ulnar nerve, right upper limb: Secondary | ICD-10-CM | POA: Diagnosis not present

## 2016-05-06 DIAGNOSIS — W1789XA Other fall from one level to another, initial encounter: Secondary | ICD-10-CM | POA: Insufficient documentation

## 2016-05-06 DIAGNOSIS — Z79899 Other long term (current) drug therapy: Secondary | ICD-10-CM | POA: Diagnosis not present

## 2016-05-06 DIAGNOSIS — S99912A Unspecified injury of left ankle, initial encounter: Secondary | ICD-10-CM | POA: Diagnosis present

## 2016-05-06 DIAGNOSIS — R531 Weakness: Secondary | ICD-10-CM | POA: Diagnosis not present

## 2016-05-06 DIAGNOSIS — R404 Transient alteration of awareness: Secondary | ICD-10-CM | POA: Diagnosis not present

## 2016-05-06 DIAGNOSIS — M130 Polyarthritis, unspecified: Secondary | ICD-10-CM | POA: Diagnosis not present

## 2016-05-06 MED ORDER — HYDROCODONE-ACETAMINOPHEN 5-325 MG PO TABS: 1.0000 | ORAL_TABLET | ORAL | 0 refills | Status: DC | PRN

## 2016-05-06 MED ORDER — SODIUM CHLORIDE 0.9 % IV SOLN: INTRAVENOUS | Status: DC

## 2016-05-06 MED ORDER — HYDROMORPHONE HCL 1 MG/ML IJ SOLN
1.0000 mg | Freq: Once | INTRAMUSCULAR | Status: AC
  Administered 2016-05-06: 1 mg via INTRAVENOUS
  Filled 2016-05-06: qty 1

## 2016-05-06 MED ORDER — HYDROCODONE-ACETAMINOPHEN 5-325 MG PO TABS: 2.0000 | ORAL_TABLET | ORAL | 0 refills | Status: DC | PRN

## 2016-05-06 NOTE — ED Notes (Signed)
Posterior split applied to left lower leg by Probation officer.  CMS intact. Education given with patient verbalizes understanding.

## 2016-05-06 NOTE — ED Triage Notes (Signed)
Pt fell through trailer and was hung upside down by left ankle.

## 2016-05-06 NOTE — ED Provider Notes (Signed)
[facility] DEPT Provider Note   CSN: [id number] Arrival date & time: 05/06/16  2029     History   Chief Complaint Chief Complaint  Patient presents with  . Ankle Pain    HPI Andrew Haley is a 51 y.o. male.  HPI Patient presents to the emergency room for evaluation of left ankle pain. Patient was working on a trailer that had a wooden floor. As the patient was standing on the floor it broke and he fell through. Patient's left ankle fell through the board, then he fell backwards off the trailer.  All of the patient's weight was being supported by his left ankle.  He has severe pain in the left ankle and left calf. EMS was called to transport him to the emergency room. Patient denies any other injuries. Past Medical History:  Diagnosis Date  . Arthritis   . Complication of anesthesia    slow to wake up  . Hypertension   . Hypertriglyceridemia   . Numbness of fingers of both hands     Patient Active Problem List   Diagnosis Date Noted  . Prostate cancer ([place]) 11/29/2015  . Postoperative anemia due to acute blood loss 05/25/2014  . History of total knee arthroplasty 05/23/2014  . H/O total knee replacement 02/22/2014    Past Surgical History:  Procedure Laterality Date  . HAND SURGERY  2003  . LESION REMOVAL Left 09/23/2013   Procedure: MINOR EXCISION 3 CM SKIN LESION OF LEFT THIGH ;  Surgeon: Jamesetta So, MD;  Location: [facility];  Service: General;  Laterality: Left;  . LYMPHADENECTOMY Bilateral 11/29/2015   Procedure: LYMPHADENECTOMY;  Surgeon: Raynelle Bring, MD;  Location: [facility];  Service: Urology;  Laterality: Bilateral;  . ROBOT ASSISTED LAPAROSCOPIC RADICAL PROSTATECTOMY N/A 11/29/2015   Procedure: XI ROBOTIC ASSISTED LAPAROSCOPIC RADICAL PROSTATECTOMY LEVEL 3;  Surgeon: Raynelle Bring, MD;  Location: [facility];  Service: Urology;  Laterality: N/A;  . TOTAL KNEE ARTHROPLASTY Left 02/22/2014   Procedure: LEFT TOTAL KNEE ARTHROPLASTY;  Surgeon: Tobi Bastos, MD;   Location: [facility];  Service: Orthopedics;  Laterality: Left;  . TOTAL KNEE ARTHROPLASTY Right 05/23/2014   Procedure: RIGHT TOTAL KNEE ARTHROPLASTY;  Surgeon: Latanya Maudlin, MD;  Location: [facility];  Service: Orthopedics;  Laterality: Right;       Home Medications    Prior to Admission medications   Medication Sig Start Date End Date Taking? Authorizing Provider  diazepam (VALIUM) 2 MG tablet Take 1 tablet (2 mg total) by mouth every 6 (six) hours as needed for anxiety. Patient not taking: Reported on 11/28/2015 05/25/14   Ardeen Jourdain, PA-C  ferrous sulfate 325 (65 FE) MG tablet Take 1 tablet (325 mg total) by mouth 3 (three) times daily after meals. Patient not taking: Reported on 11/28/2015 05/25/14   Ardeen Jourdain, PA-C  HYDROcodone-acetaminophen (NORCO/VICODIN) 5-325 MG tablet Take 1 tablet by mouth every 4 (four) hours as needed. 05/06/16   Dorie Rank, MD  lisinopril-hydrochlorothiazide (PRINZIDE,ZESTORETIC) 20-12.5 MG per tablet Take 1 tablet by mouth every morning.    Historical Provider, MD  sulfamethoxazole-trimethoprim (BACTRIM DS,SEPTRA DS) 800-160 MG tablet Take 1 tablet by mouth 2 (two) times daily. Start the day prior to foley removal appointment 11/29/15   Debbrah Alar, PA-C    Family History No family history on file.  Social History Social History  Substance Use Topics  . Smoking status: Never Smoker  . Smokeless tobacco: Never Used  . Alcohol use Yes     Comment:  OCCASIONAL     Allergies   Patient has no known allergies.   Review of Systems Review of Systems  All other systems reviewed and are negative.    Physical Exam Updated Vital Signs BP (!) 150/98 (BP Location: Right Arm)   Pulse 79   Temp 98.2 F (36.8 C) (Oral)   Resp 18   SpO2 100%   Physical Exam  Constitutional: He appears well-developed and well-nourished. No distress.  HENT:  Head: Normocephalic and atraumatic.  Right Ear: External ear normal.  Left Ear: External ear normal.    Eyes: Conjunctivae are normal. Right eye exhibits no discharge. Left eye exhibits no discharge. No scleral icterus.  Neck: Neck supple. No tracheal deviation present.  Cardiovascular: Normal rate.   Pulmonary/Chest: Effort normal. No stridor. No respiratory distress.  Abdominal: He exhibits no distension.  Musculoskeletal: He exhibits no edema.       Left ankle: He exhibits decreased range of motion, swelling and deformity. Tenderness. Lateral malleolus and medial malleolus tenderness found.       Left lower leg: He exhibits tenderness. He exhibits no bony tenderness, no edema and no deformity.  Capillary Refill brisk, strong dorsalis pedis pulse, patient is able to move his toes  Neurological: He is alert. Cranial nerve deficit: no gross deficits.  Skin: Skin is warm and dry. No rash noted.  Psychiatric: He has a normal mood and affect.  Nursing note and vitals reviewed.    ED Treatments / Results    Radiology Dg Tibia/fibula Left  Result Date: 05/06/2016 CLINICAL DATA:  Pain after fall EXAM: LEFT TIBIA AND FIBULA - 2 VIEW COMPARISON:  Same day ankle radiographs FINDINGS: Acute, closed, oblique distal fibular diaphyseal fracture extending into the ankle joint is noted. There is slight lateral and dorsal displacement of the distal fracture fragment. There is overlying soft tissue swelling about the malleoli. A nondisplaced acute, closed fibular neck fracture is identified. Intact left total knee arthroplasty with approximately 1 mm of lucency between the tibial tray and lateral tibial plateau, within acceptable limits and likely not reflecting loosening of the hardware. IMPRESSION: 1. Acute, closed, fibular neck fracture. 2. Acute, closed, oblique distal fibular diaphyseal fracture with extension into the ankle joint and adjacent surrounding soft tissue swelling. 1/4 shaft with dorsal and lateral displacement of the distal fracture fragment. Electronically Signed   By: Ashley Royalty M.D.   On:  05/06/2016 21:44   Dg Ankle Complete Left  Result Date: 05/06/2016 CLINICAL DATA:  Left ankle pain after fall EXAM: LEFT ANKLE COMPLETE - 3+ VIEW COMPARISON:  None. FINDINGS: Acute, closed, oblique distal diaphyseal fracture of the fibula with lateral and posterior displacement approximately 1/4 shaft width. Ankle mortise is maintained. Fracture lucency appears to extend into the ankle joint. No widening of the ankle mortise. Well corticated ossifications are seen off the tip of the medial malleolus consistent with old remote trauma. Soft tissue swelling is seen of the ankle about the malleoli. Small plantar calcaneal enthesophyte is noted. Subtalar and midfoot articulations are maintained. Degenerative joint space narrowing and spurring across the talonavicular joint. IMPRESSION: 1. Acute, closed, oblique distal diaphyseal fracture of the fibula with lateral and posterior displacement approximately 1/4 shaft width. There appears be intra-articular extension of the fracture into the ankle joint. No widening of the ankle mortise. 2. Small plantar calcaneal enthesophyte. Electronically Signed   By: Ashley Royalty M.D.   On: 05/06/2016 21:40    Procedures Procedures (including critical care time)  Medications Ordered  in ED Medications  0.9 %  sodium chloride infusion (not administered)  HYDROmorphone (DILAUDID) injection 1 mg (1 mg Intravenous Given 05/06/16 2058)     Initial Impression / Assessment and Plan / ED Course  I have reviewed the triage vital signs and the nursing notes.  Pertinent labs & imaging results that were available during my care of the patient were reviewed by me and considered in my medical decision making (see chart for details).   Fx noted above.  Splint ordered by me and applied in the ED by ED tech.  Crutches given.  Discussed non weightbearing.  Follow up with the orthopedic doctor.     Final Clinical Impressions(s) / ED Diagnoses   Final diagnoses:  Closed fracture  of proximal end of left fibula, unspecified fracture morphology, initial encounter  Closed fracture of distal end of left fibula, unspecified fracture morphology, initial encounter    New Prescriptions New Prescriptions   HYDROCODONE-ACETAMINOPHEN (NORCO/VICODIN) 5-325 MG TABLET    Take 1 tablet by mouth every 4 (four) hours as needed.     Dorie Rank, MD 05/06/16 2212

## 2016-05-06 NOTE — ED Notes (Signed)
Patient given prepack of Norco to go with verbal instructions. Patient verbally understands.

## 2016-05-06 NOTE — ED Notes (Signed)
Patients foot elevated and ice back applied.

## 2016-05-06 NOTE — Discharge Instructions (Signed)
Contact your orthopedic doctor to arrange follow up, keep your leg elevated, apply ice to the area, use the crutches, maintain the splint and do not put any weight on your left leg

## 2016-05-12 DIAGNOSIS — S8262XD Displaced fracture of lateral malleolus of left fibula, subsequent encounter for closed fracture with routine healing: Secondary | ICD-10-CM | POA: Diagnosis not present

## 2016-05-13 DIAGNOSIS — S9305XA Dislocation of left ankle joint, initial encounter: Secondary | ICD-10-CM | POA: Diagnosis not present

## 2016-05-13 DIAGNOSIS — S8262XA Displaced fracture of lateral malleolus of left fibula, initial encounter for closed fracture: Secondary | ICD-10-CM | POA: Diagnosis not present

## 2016-05-13 DIAGNOSIS — G8918 Other acute postprocedural pain: Secondary | ICD-10-CM | POA: Diagnosis not present

## 2016-05-13 DIAGNOSIS — S93432A Sprain of tibiofibular ligament of left ankle, initial encounter: Secondary | ICD-10-CM | POA: Diagnosis not present

## 2016-05-19 DIAGNOSIS — S8262XD Displaced fracture of lateral malleolus of left fibula, subsequent encounter for closed fracture with routine healing: Secondary | ICD-10-CM | POA: Diagnosis not present

## 2016-05-21 MED FILL — Hydrocodone-Acetaminophen Tab 5-325 MG: ORAL | Qty: 6 | Status: AC

## 2016-05-28 DIAGNOSIS — S8262XD Displaced fracture of lateral malleolus of left fibula, subsequent encounter for closed fracture with routine healing: Secondary | ICD-10-CM | POA: Diagnosis not present

## 2016-05-29 DIAGNOSIS — M130 Polyarthritis, unspecified: Secondary | ICD-10-CM | POA: Diagnosis not present

## 2016-05-29 DIAGNOSIS — E669 Obesity, unspecified: Secondary | ICD-10-CM | POA: Diagnosis not present

## 2016-05-29 DIAGNOSIS — M5412 Radiculopathy, cervical region: Secondary | ICD-10-CM | POA: Diagnosis not present

## 2016-05-29 DIAGNOSIS — G5621 Lesion of ulnar nerve, right upper limb: Secondary | ICD-10-CM | POA: Diagnosis not present

## 2016-05-29 DIAGNOSIS — I10 Essential (primary) hypertension: Secondary | ICD-10-CM | POA: Diagnosis not present

## 2016-05-29 DIAGNOSIS — M79641 Pain in right hand: Secondary | ICD-10-CM | POA: Diagnosis not present

## 2016-05-29 DIAGNOSIS — G5622 Lesion of ulnar nerve, left upper limb: Secondary | ICD-10-CM | POA: Diagnosis not present

## 2016-06-25 DIAGNOSIS — S8362XD Sprain of the superior tibiofibular joint and ligament, left knee, subsequent encounter: Secondary | ICD-10-CM | POA: Diagnosis not present

## 2016-07-25 DIAGNOSIS — S8262XD Displaced fracture of lateral malleolus of left fibula, subsequent encounter for closed fracture with routine healing: Secondary | ICD-10-CM | POA: Diagnosis not present

## 2016-08-01 DIAGNOSIS — Z1389 Encounter for screening for other disorder: Secondary | ICD-10-CM | POA: Diagnosis not present

## 2016-08-01 DIAGNOSIS — Z6841 Body Mass Index (BMI) 40.0 and over, adult: Secondary | ICD-10-CM | POA: Diagnosis not present

## 2016-08-01 DIAGNOSIS — G894 Chronic pain syndrome: Secondary | ICD-10-CM | POA: Diagnosis not present

## 2016-08-28 DIAGNOSIS — I10 Essential (primary) hypertension: Secondary | ICD-10-CM | POA: Diagnosis not present

## 2016-08-28 DIAGNOSIS — Z1389 Encounter for screening for other disorder: Secondary | ICD-10-CM | POA: Diagnosis not present

## 2016-08-28 DIAGNOSIS — Z6841 Body Mass Index (BMI) 40.0 and over, adult: Secondary | ICD-10-CM | POA: Diagnosis not present

## 2016-08-28 DIAGNOSIS — G8321 Monoplegia of upper limb affecting right dominant side: Secondary | ICD-10-CM | POA: Diagnosis not present

## 2016-08-28 DIAGNOSIS — C61 Malignant neoplasm of prostate: Secondary | ICD-10-CM | POA: Diagnosis not present

## 2016-08-28 DIAGNOSIS — M1991 Primary osteoarthritis, unspecified site: Secondary | ICD-10-CM | POA: Diagnosis not present

## 2016-08-28 DIAGNOSIS — G894 Chronic pain syndrome: Secondary | ICD-10-CM | POA: Diagnosis not present

## 2016-09-10 DIAGNOSIS — G459 Transient cerebral ischemic attack, unspecified: Secondary | ICD-10-CM | POA: Diagnosis not present

## 2016-09-10 DIAGNOSIS — G8321 Monoplegia of upper limb affecting right dominant side: Secondary | ICD-10-CM | POA: Diagnosis not present

## 2016-09-10 DIAGNOSIS — R531 Weakness: Secondary | ICD-10-CM | POA: Diagnosis not present

## 2016-09-10 DIAGNOSIS — J328 Other chronic sinusitis: Secondary | ICD-10-CM | POA: Diagnosis not present

## 2016-09-19 DIAGNOSIS — G459 Transient cerebral ischemic attack, unspecified: Secondary | ICD-10-CM | POA: Diagnosis not present

## 2016-10-29 DIAGNOSIS — N5201 Erectile dysfunction due to arterial insufficiency: Secondary | ICD-10-CM | POA: Diagnosis not present

## 2016-10-29 DIAGNOSIS — C61 Malignant neoplasm of prostate: Secondary | ICD-10-CM | POA: Diagnosis not present

## 2016-12-03 DIAGNOSIS — E669 Obesity, unspecified: Secondary | ICD-10-CM | POA: Diagnosis not present

## 2016-12-03 DIAGNOSIS — M1991 Primary osteoarthritis, unspecified site: Secondary | ICD-10-CM | POA: Diagnosis not present

## 2016-12-03 DIAGNOSIS — Z6841 Body Mass Index (BMI) 40.0 and over, adult: Secondary | ICD-10-CM | POA: Diagnosis not present

## 2016-12-03 DIAGNOSIS — Z23 Encounter for immunization: Secondary | ICD-10-CM | POA: Diagnosis not present

## 2016-12-03 DIAGNOSIS — G894 Chronic pain syndrome: Secondary | ICD-10-CM | POA: Diagnosis not present

## 2017-01-29 DIAGNOSIS — M1991 Primary osteoarthritis, unspecified site: Secondary | ICD-10-CM | POA: Diagnosis not present

## 2017-01-29 DIAGNOSIS — Z0001 Encounter for general adult medical examination with abnormal findings: Secondary | ICD-10-CM | POA: Diagnosis not present

## 2017-01-29 DIAGNOSIS — G894 Chronic pain syndrome: Secondary | ICD-10-CM | POA: Diagnosis not present

## 2017-01-29 DIAGNOSIS — Z0000 Encounter for general adult medical examination without abnormal findings: Secondary | ICD-10-CM | POA: Diagnosis not present

## 2017-01-29 DIAGNOSIS — Z1389 Encounter for screening for other disorder: Secondary | ICD-10-CM | POA: Diagnosis not present

## 2017-01-29 DIAGNOSIS — R0602 Shortness of breath: Secondary | ICD-10-CM | POA: Diagnosis not present

## 2017-01-29 DIAGNOSIS — Z6841 Body Mass Index (BMI) 40.0 and over, adult: Secondary | ICD-10-CM | POA: Diagnosis not present

## 2017-03-06 DIAGNOSIS — R0683 Snoring: Secondary | ICD-10-CM | POA: Diagnosis not present

## 2017-03-10 DIAGNOSIS — G4730 Sleep apnea, unspecified: Secondary | ICD-10-CM | POA: Diagnosis not present

## 2017-03-27 DIAGNOSIS — Z0001 Encounter for general adult medical examination with abnormal findings: Secondary | ICD-10-CM | POA: Diagnosis not present

## 2017-04-28 DIAGNOSIS — G4733 Obstructive sleep apnea (adult) (pediatric): Secondary | ICD-10-CM | POA: Diagnosis not present

## 2017-05-13 ENCOUNTER — Encounter: Payer: Self-pay | Admitting: Internal Medicine

## 2017-05-18 DIAGNOSIS — G4733 Obstructive sleep apnea (adult) (pediatric): Secondary | ICD-10-CM | POA: Diagnosis not present

## 2017-05-20 DIAGNOSIS — C61 Malignant neoplasm of prostate: Secondary | ICD-10-CM | POA: Diagnosis not present

## 2017-05-20 DIAGNOSIS — N5201 Erectile dysfunction due to arterial insufficiency: Secondary | ICD-10-CM | POA: Diagnosis not present

## 2017-05-20 DIAGNOSIS — R351 Nocturia: Secondary | ICD-10-CM | POA: Diagnosis not present

## 2017-06-03 DIAGNOSIS — Z6841 Body Mass Index (BMI) 40.0 and over, adult: Secondary | ICD-10-CM | POA: Diagnosis not present

## 2017-06-03 DIAGNOSIS — I10 Essential (primary) hypertension: Secondary | ICD-10-CM | POA: Diagnosis not present

## 2017-06-03 DIAGNOSIS — Z1389 Encounter for screening for other disorder: Secondary | ICD-10-CM | POA: Diagnosis not present

## 2017-06-03 DIAGNOSIS — G894 Chronic pain syndrome: Secondary | ICD-10-CM | POA: Diagnosis not present

## 2017-06-17 DIAGNOSIS — G4733 Obstructive sleep apnea (adult) (pediatric): Secondary | ICD-10-CM | POA: Diagnosis not present

## 2017-07-08 DIAGNOSIS — G4733 Obstructive sleep apnea (adult) (pediatric): Secondary | ICD-10-CM | POA: Diagnosis not present

## 2017-07-08 DIAGNOSIS — J22 Unspecified acute lower respiratory infection: Secondary | ICD-10-CM | POA: Diagnosis not present

## 2017-07-08 DIAGNOSIS — Z1389 Encounter for screening for other disorder: Secondary | ICD-10-CM | POA: Diagnosis not present

## 2017-07-08 DIAGNOSIS — Z6839 Body mass index (BMI) 39.0-39.9, adult: Secondary | ICD-10-CM | POA: Diagnosis not present

## 2017-07-18 DIAGNOSIS — G4733 Obstructive sleep apnea (adult) (pediatric): Secondary | ICD-10-CM | POA: Diagnosis not present

## 2017-07-30 ENCOUNTER — Telehealth: Payer: Self-pay | Admitting: *Deleted

## 2017-07-30 ENCOUNTER — Other Ambulatory Visit: Payer: Self-pay | Admitting: *Deleted

## 2017-07-30 ENCOUNTER — Ambulatory Visit (INDEPENDENT_AMBULATORY_CARE_PROVIDER_SITE_OTHER): Payer: Medicare HMO | Admitting: Gastroenterology

## 2017-07-30 ENCOUNTER — Encounter: Payer: Self-pay | Admitting: Gastroenterology

## 2017-07-30 ENCOUNTER — Encounter: Payer: Self-pay | Admitting: *Deleted

## 2017-07-30 DIAGNOSIS — Z1211 Encounter for screening for malignant neoplasm of colon: Secondary | ICD-10-CM

## 2017-07-30 MED ORDER — PEG 3350-KCL-NA BICARB-NACL 420 G PO SOLR: 4000.0000 mL | Freq: Once | ORAL | 0 refills | Status: AC

## 2017-07-30 NOTE — Assessment & Plan Note (Signed)
52 year old pleasant male presenting with need for initial screening colonoscopy. He has no concerning lower or upper GI symptoms; denies any family history of colorectal cancer or polyps. Will need Propofol due to long-standing need for narcotics due to chronic back pain.  Proceed with TCS with Dr. Gala Romney in near future: the risks, benefits, and alternatives have been discussed with the patient in detail. The patient states understanding and desires to proceed. Propofol due to polypharmacy

## 2017-07-30 NOTE — Progress Notes (Signed)
Primary Care Physician:  Redmond School, MD Primary Gastroenterologist:  Dr. Gala Romney   Chief Complaint  Patient presents with  . Colonoscopy    consult    HPI:   Andrew Haley is a 52 y.o. male presenting today at the request of Dr. Gerarda Fraction for initial screening colonoscopy. He was brought into the office due to need for Propofol.  Has chronic back pain and takes Norco once per day. Occasional alcohol on the weekends socially. No GERD, dysphagia, loss of appetite, N/V, melena, hematochezia, changes in bowel habits, constipation, diarrhea. No concerning upper or lower GI symptoms. No FH of colorectal cancer or polyps.   Past Medical History:  Diagnosis Date  . Arthritis   . Complication of anesthesia    slow to wake up  . Hypertension   . Hypertriglyceridemia   . Numbness of fingers of both hands   . Prostate cancer ([place]) 2017  . Sleep apnea    CPAP    Past Surgical History:  Procedure Laterality Date  . HAND SURGERY  2003   cysts   . LESION REMOVAL Left 09/23/2013   Procedure: MINOR EXCISION 3 CM SKIN LESION OF LEFT THIGH ;  Surgeon: Jamesetta So, MD;  Location: [facility];  Service: General;  Laterality: Left;  . LYMPHADENECTOMY Bilateral 11/29/2015   Procedure: LYMPHADENECTOMY;  Surgeon: Raynelle Bring, MD;  Location: [facility];  Service: Urology;  Laterality: Bilateral;  . ROBOT ASSISTED LAPAROSCOPIC RADICAL PROSTATECTOMY N/A 11/29/2015   Procedure: XI ROBOTIC ASSISTED LAPAROSCOPIC RADICAL PROSTATECTOMY LEVEL 3;  Surgeon: Raynelle Bring, MD;  Location: [facility];  Service: Urology;  Laterality: N/A;  . TOTAL KNEE ARTHROPLASTY Left 02/22/2014   Procedure: LEFT TOTAL KNEE ARTHROPLASTY;  Surgeon: Tobi Bastos, MD;  Location: [facility];  Service: Orthopedics;  Laterality: Left;  . TOTAL KNEE ARTHROPLASTY Right 05/23/2014   Procedure: RIGHT TOTAL KNEE ARTHROPLASTY;  Surgeon: Latanya Maudlin, MD;  Location: [facility];  Service: Orthopedics;  Laterality: Right;    Current Outpatient  Medications  Medication Sig Dispense Refill  . diclofenac (VOLTAREN) 75 MG EC tablet Take 2 tablets by mouth daily.    . diphenhydrAMINE-APAP, sleep, (GOODYS PM PO) Take by mouth as needed.    Marland Kitchen HYDROcodone-acetaminophen (NORCO) 10-325 MG tablet Take 1 tablet by mouth daily.    Marland Kitchen lisinopril-hydrochlorothiazide (PRINZIDE,ZESTORETIC) 20-12.5 MG per tablet Take 1 tablet by mouth every morning.     No current facility-administered medications for this visit.     Allergies as of 07/30/2017  . (No Known Allergies)    Family History  Problem Relation Age of Onset  . Heart disease Mother   . Throat cancer Father   . Colon cancer Neg Hx   . Colon polyps Neg Hx     Social History   Socioeconomic History  . Marital status: Single    Spouse name: Not on file  . Number of children: Not on file  . Years of education: Not on file  . Highest education level: Not on file  Occupational History  . Occupation: disability  Social Needs  . Financial resource strain: Not on file  . Food insecurity:    Worry: Not on file    Inability: Not on file  . Transportation needs:    Medical: Not on file    Non-medical: Not on file  Tobacco Use  . Smoking status: Never Smoker  . Smokeless tobacco: Never Used  Substance and Sexual Activity  . Alcohol use: Yes  Comment: OCCASIONAL  . Drug use: No  . Sexual activity: Not on file  Lifestyle  . Physical activity:    Days per week: Not on file    Minutes per session: Not on file  . Stress: Not on file  Relationships  . Social connections:    Talks on phone: Not on file    Gets together: Not on file    Attends religious service: Not on file    Active member of club or organization: Not on file    Attends meetings of clubs or organizations: Not on file    Relationship status: Not on file  . Intimate partner violence:    Fear of current or ex partner: Not on file    Emotionally abused: Not on file    Physically abused: Not on file    Forced  sexual activity: Not on file  Other Topics Concern  . Not on file  Social History Narrative  . Not on file    Review of Systems: Gen: Denies any fever, chills, fatigue, weight loss, lack of appetite.  CV: Denies chest pain, heart palpitations, peripheral edema, syncope.  Resp: Denies shortness of breath at rest or with exertion. Denies wheezing or cough.  GI: see HPI  GU : Denies urinary burning, urinary frequency, urinary hesitancy MS: see HPI  Derm: Denies rash, itching, dry skin Psych: Denies depression, anxiety, memory loss, and confusion Heme: Denies bruising, bleeding, and enlarged lymph nodes.  Physical Exam: BP (!) 146/90   Pulse 91   Temp (!) 96.5 F (35.8 C) (Oral)   Ht 6\' 3"  (1.905 m)   Wt 294 lb 3.2 oz (133.4 kg)   BMI 36.77 kg/m  General:   Alert and oriented. Pleasant and cooperative. Well-nourished and well-developed.  Head:  Normocephalic and atraumatic. Eyes:  Without icterus, sclera clear and conjunctiva pink.  Ears:  Normal auditory acuity. Nose:  No deformity, discharge,  or lesions. Mouth:  No deformity or lesions, oral mucosa pink.  Lungs:  Clear to auscultation bilaterally. No wheezes, rales, or rhonchi. No distress.  Heart:  S1, S2 present without murmurs appreciated.  Abdomen:  +BS, soft, non-tender and non-distended. No HSM noted. No guarding or rebound. No masses appreciated.  Rectal:  Deferred  Msk:  Symmetrical without gross deformities. Normal posture. Extremities:  Without  edema. Neurologic:  Alert and  oriented x4;  grossly normal neurologically. Psych:  Alert and cooperative. Normal mood and affect.

## 2017-07-30 NOTE — Telephone Encounter (Signed)
Pre-op scheduled for 09/21/17 at 12:45pm. Letter mailed. Patient aware

## 2017-07-30 NOTE — Patient Instructions (Signed)
We have arranged a colonoscopy with Dr. Rourk in the near future.  Further recommendations to follow!  It was a pleasure to see you today. I strive to create trusting relationships with patients to provide genuine, compassionate, and quality care. I value your feedback. If you receive a survey regarding your visit,  I greatly appreciate you taking time to fill this out.   Bruno Leach W. Anyssa Sharpless, PhD, ANP-BC [facility]    

## 2017-07-30 NOTE — Progress Notes (Signed)
CC'D TO PCP °

## 2017-08-11 ENCOUNTER — Telehealth: Payer: Self-pay | Admitting: *Deleted

## 2017-08-11 NOTE — Telephone Encounter (Signed)
Called [employer] and spoke with Mae (PA rep intake). Was advised no PA is required for TCS. Ref #[id number]

## 2017-08-17 DIAGNOSIS — G4733 Obstructive sleep apnea (adult) (pediatric): Secondary | ICD-10-CM | POA: Diagnosis not present

## 2017-08-31 DIAGNOSIS — B880 Other acariasis: Secondary | ICD-10-CM | POA: Diagnosis not present

## 2017-08-31 DIAGNOSIS — Z6841 Body Mass Index (BMI) 40.0 and over, adult: Secondary | ICD-10-CM | POA: Diagnosis not present

## 2017-08-31 DIAGNOSIS — G894 Chronic pain syndrome: Secondary | ICD-10-CM | POA: Diagnosis not present

## 2017-09-16 NOTE — Patient Instructions (Signed)
Andrew Haley  09/16/2017     @PREFPERIOPPHARMACY @   Your procedure is scheduled on  09/28/2017 .  Report to [facility] at  1030  A.M.  Call this number if you have problems the morning of surgery:  [phone]   Remember:  Do not eat or drink after midnight.  You may drink clear liquids until(follow the instructions given to you).  Clear liquids allowed are:                    Water, Juice (non-citric and without pulp), Carbonated beverages, Clear Tea, Black Coffee only, Plain Jell-O only, Gatorade and Plain Popsicles only    Take these medicines the morning of surgery with A SIP OF WATER  Voltaren, hydrocodone (if needed), lisinopril.    Do not wear jewelry, make-up or nail polish.  Do not wear lotions, powders, or perfumes, or deodorant.  Do not shave 48 hours prior to surgery.  Men may shave face and neck.  Do not bring valuables to the hospital.  [facility] is not responsible for any belongings or valuables.  Contacts, dentures or bridgework may not be worn into surgery.  Leave your suitcase in the car.  After surgery it may be brought to your room.  For patients admitted to the hospital, discharge time will be determined by your treatment team.  Patients discharged the day of surgery will not be allowed to drive home.   Name and phone number of your driver:   family Special instructions:  Follow the diet and prep instructions given to you by Dr Roseanne Kaufman office.  Please read over the following fact sheets that you were given. Anesthesia Post-op Instructions and Care and Recovery After Surgery       Colonoscopy, Adult A colonoscopy is an exam to look at the large intestine. It is done to check for problems, such as:  Lumps (tumors).  Growths (polyps).  Swelling (inflammation).  Bleeding.  What happens before the procedure? Eating and drinking Follow instructions from your doctor about eating and drinking. These instructions may include:  A few  days before the procedure - follow a low-fiber diet. ? Avoid nuts. ? Avoid seeds. ? Avoid dried fruit. ? Avoid raw fruits. ? Avoid vegetables.  1-3 days before the procedure - follow a clear liquid diet. Avoid liquids that have red or purple dye. Drink only clear liquids, such as: ? Clear broth or bouillon. ? Black coffee or tea. ? Clear juice. ? Clear soft drinks or sports drinks. ? Gelatin dessert. ? Popsicles.  On the day of the procedure - do not eat or drink anything during the 2 hours before the procedure.  Bowel prep If you were prescribed an oral bowel prep:  Take it as told by your doctor. Starting the day before your procedure, you will need to drink a lot of liquid. The liquid will cause you to poop (have bowel movements) until your poop is almost clear or light green.  If your skin or butt gets irritated from diarrhea, you may: ? Wipe the area with wipes that have medicine in them, such as adult wet wipes with aloe and vitamin E. ? Put something on your skin that soothes the area, such as petroleum jelly.  If you throw up (vomit) while drinking the bowel prep, take a break for up to 60 minutes. Then begin the bowel prep again. If you keep throwing up and  you cannot take the bowel prep without throwing up, call your doctor.  General instructions  Ask your doctor about changing or stopping your normal medicines. This is important if you take diabetes medicines or blood thinners.  Plan to have someone take you home from the hospital or clinic. What happens during the procedure?  An IV tube may be put into one of your veins.  You will be given medicine to help you relax (sedative).  To reduce your risk of infection: ? Your doctors will wash their hands. ? Your anal area will be washed with soap.  You will be asked to lie on your side with your knees bent.  Your doctor will get a long, thin, flexible tube ready. The tube will have a camera and a light on the  end.  The tube will be put into your anus.  The tube will be gently put into your large intestine.  Air will be delivered into your large intestine to keep it open. You may feel some pressure or cramping.  The camera will be used to take photos.  A small tissue sample may be removed from your body to be looked at under a microscope (biopsy). If any possible problems are found, the tissue will be sent to a lab for testing.  If small growths are found, your doctor may remove them and have them checked for cancer.  The tube that was put into your anus will be slowly removed. The procedure may vary among doctors and hospitals. What happens after the procedure?  Your doctor will check on you often until the medicines you were given have worn off.  Do not drive for 24 hours after the procedure.  You may have a small amount of blood in your poop.  You may pass gas.  You may have mild cramps or bloating in your belly (abdomen).  It is up to you to get the results of your procedure. Ask your doctor, or the department performing the procedure, when your results will be ready. This information is not intended to replace advice given to you by your health care provider. Make sure you discuss any questions you have with your health care provider. Document Released: 03/01/2010 Document Revised: 11/28/2015 Document Reviewed: 04/10/2015 Elsevier Interactive Patient Education  2017 [employer]  Colonoscopy, Adult, Care After This sheet gives you information about how to care for yourself after your procedure. Your health care provider may also give you more specific instructions. If you have problems or questions, contact your health care provider. What can I expect after the procedure? After the procedure, it is common to have:  A small amount of blood in your stool for 24 hours after the procedure.  Some gas.  Mild abdominal cramping or bloating.  Follow these instructions at  home: General instructions   For the first 24 hours after the procedure: ? Do not drive or use machinery. ? Do not sign important documents. ? Do not drink alcohol. ? Do your regular daily activities at a slower pace than normal. ? Eat soft, easy-to-digest foods. ? Rest often.  Take over-the-counter or prescription medicines only as told by your health care provider.  It is up to you to get the results of your procedure. Ask your health care provider, or the department performing the procedure, when your results will be ready. Relieving cramping and bloating  Try walking around when you have cramps or feel bloated.  Apply heat to your abdomen as told  by your health care provider. Use a heat source that your health care provider recommends, such as a moist heat pack or a heating pad. ? Place a towel between your skin and the heat source. ? Leave the heat on for 20-30 minutes. ? Remove the heat if your skin turns bright red. This is especially important if you are unable to feel pain, heat, or cold. You may have a greater risk of getting burned. Eating and drinking  Drink enough fluid to keep your urine clear or pale yellow.  Resume your normal diet as instructed by your health care provider. Avoid heavy or fried foods that are hard to digest.  Avoid drinking alcohol for as long as instructed by your health care provider. Contact a health care provider if:  You have blood in your stool 2-3 days after the procedure. Get help right away if:  You have more than a small spotting of blood in your stool.  You pass large blood clots in your stool.  Your abdomen is swollen.  You have nausea or vomiting.  You have a fever.  You have increasing abdominal pain that is not relieved with medicine. This information is not intended to replace advice given to you by your health care provider. Make sure you discuss any questions you have with your health care provider. Document Released:  09/11/2003 Document Revised: 10/22/2015 Document Reviewed: 04/10/2015 Elsevier Interactive Patient Education  2018 [place] Anesthesia is a term that refers to techniques, procedures, and medicines that help a person stay safe and comfortable during a medical procedure. Monitored anesthesia care, or sedation, is one type of anesthesia. Your anesthesia specialist may recommend sedation if you will be having a procedure that does not require you to be unconscious, such as:  Cataract surgery.  A dental procedure.  A biopsy.  A colonoscopy.  During the procedure, you may receive a medicine to help you relax (sedative). There are three levels of sedation:  Mild sedation. At this level, you may feel awake and relaxed. You will be able to follow directions.  Moderate sedation. At this level, you will be sleepy. You may not remember the procedure.  Deep sedation. At this level, you will be asleep. You will not remember the procedure.  The more medicine you are given, the deeper your level of sedation will be. Depending on how you respond to the procedure, the anesthesia specialist may change your level of sedation or the type of anesthesia to fit your needs. An anesthesia specialist will monitor you closely during the procedure. Let your health care provider know about:  Any allergies you have.  All medicines you are taking, including vitamins, herbs, eye drops, creams, and over-the-counter medicines.  Any use of steroids (by mouth or as a cream).  Any problems you or family members have had with sedatives and anesthetic medicines.  Any blood disorders you have.  Any surgeries you have had.  Any medical conditions you have, such as sleep apnea.  Whether you are pregnant or may be pregnant.  Any use of cigarettes, alcohol, or street drugs. What are the risks? Generally, this is a safe procedure. However, problems may occur, including:  Getting too  much medicine (oversedation).  Nausea.  Allergic reaction to medicines.  Trouble breathing. If this happens, a breathing tube may be used to help with breathing. It will be removed when you are awake and breathing on your own.  Heart trouble.  Lung trouble.  Before the procedure Staying hydrated Follow instructions from your health care provider about hydration, which may include:  Up to 2 hours before the procedure - you may continue to drink clear liquids, such as water, clear fruit juice, black coffee, and plain tea.  Eating and drinking restrictions Follow instructions from your health care provider about eating and drinking, which may include:  8 hours before the procedure - stop eating heavy meals or foods such as meat, fried foods, or fatty foods.  6 hours before the procedure - stop eating light meals or foods, such as toast or cereal.  6 hours before the procedure - stop drinking milk or drinks that contain milk.  2 hours before the procedure - stop drinking clear liquids.  Medicines Ask your health care provider about:  Changing or stopping your regular medicines. This is especially important if you are taking diabetes medicines or blood thinners.  Taking medicines such as aspirin and ibuprofen. These medicines can thin your blood. Do not take these medicines before your procedure if your health care provider instructs you not to.  Tests and exams  You will have a physical exam.  You may have blood tests done to show: ? How well your kidneys and liver are working. ? How well your blood can clot.  General instructions  Plan to have someone take you home from the hospital or clinic.  If you will be going home right after the procedure, plan to have someone with you for 24 hours.  What happens during the procedure?  Your blood pressure, heart rate, breathing, level of pain and overall condition will be monitored.  An IV tube will be inserted into one of  your veins.  Your anesthesia specialist will give you medicines as needed to keep you comfortable during the procedure. This may mean changing the level of sedation.  The procedure will be performed. After the procedure  Your blood pressure, heart rate, breathing rate, and blood oxygen level will be monitored until the medicines you were given have worn off.  Do not drive for 24 hours if you received a sedative.  You may: ? Feel sleepy, clumsy, or nauseous. ? Feel forgetful about what happened after the procedure. ? Have a sore throat if you had a breathing tube during the procedure. ? Vomit. This information is not intended to replace advice given to you by your health care provider. Make sure you discuss any questions you have with your health care provider. Document Released: 10/23/2004 Document Revised: 07/06/2015 Document Reviewed: 05/20/2015 Elsevier Interactive Patient Education  2018 [place], Care After These instructions provide you with information about caring for yourself after your procedure. Your health care provider may also give you more specific instructions. Your treatment has been planned according to current medical practices, but problems sometimes occur. Call your health care provider if you have any problems or questions after your procedure. What can I expect after the procedure? After your procedure, it is common to:  Feel sleepy for several hours.  Feel clumsy and have poor balance for several hours.  Feel forgetful about what happened after the procedure.  Have poor judgment for several hours.  Feel nauseous or vomit.  Have a sore throat if you had a breathing tube during the procedure.  Follow these instructions at home: For at least 24 hours after the procedure:   Do not: ? Participate in activities in which you could fall or become injured. ? Drive. ?  Use heavy machinery. ? Drink alcohol. ? Take sleeping pills  or medicines that cause drowsiness. ? Make important decisions or sign legal documents. ? Take care of children on your own.  Rest. Eating and drinking  Follow the diet that is recommended by your health care provider.  If you vomit, drink water, juice, or soup when you can drink without vomiting.  Make sure you have little or no nausea before eating solid foods. General instructions  Have a responsible adult stay with you until you are awake and alert.  Take over-the-counter and prescription medicines only as told by your health care provider.  If you smoke, do not smoke without supervision.  Keep all follow-up visits as told by your health care provider. This is important. Contact a health care provider if:  You keep feeling nauseous or you keep vomiting.  You feel light-headed.  You develop a rash.  You have a fever. Get help right away if:  You have trouble breathing. This information is not intended to replace advice given to you by your health care provider. Make sure you discuss any questions you have with your health care provider. Document Released: 05/20/2015 Document Revised: 09/19/2015 Document Reviewed: 05/20/2015 Elsevier Interactive Patient Education  [employer].

## 2017-09-21 ENCOUNTER — Encounter (HOSPITAL_COMMUNITY)
Admission: RE | Admit: 2017-09-21 | Discharge: 2017-09-21 | Disposition: A | Discharge status: Home / Self Care | Payer: Medicare HMO | Source: Ambulatory Visit | Attending: Internal Medicine | Admitting: Internal Medicine

## 2017-09-23 ENCOUNTER — Encounter (HOSPITAL_COMMUNITY)
Admission: RE | Admit: 2017-09-23 | Discharge: 2017-09-23 | Disposition: A | Discharge status: Home / Self Care | Payer: Medicare HMO | Source: Ambulatory Visit | Attending: Internal Medicine | Admitting: Internal Medicine

## 2017-09-23 ENCOUNTER — Encounter (HOSPITAL_COMMUNITY): Payer: Self-pay

## 2017-09-23 ENCOUNTER — Other Ambulatory Visit: Payer: Self-pay

## 2017-09-23 DIAGNOSIS — R9431 Abnormal electrocardiogram [ECG] [EKG]: Secondary | ICD-10-CM | POA: Diagnosis not present

## 2017-09-23 DIAGNOSIS — Z01812 Encounter for preprocedural laboratory examination: Secondary | ICD-10-CM | POA: Diagnosis not present

## 2017-09-23 DIAGNOSIS — Z01810 Encounter for preprocedural cardiovascular examination: Secondary | ICD-10-CM | POA: Diagnosis not present

## 2017-09-23 DIAGNOSIS — I4510 Unspecified right bundle-branch block: Secondary | ICD-10-CM | POA: Insufficient documentation

## 2017-09-23 LAB — BASIC METABOLIC PANEL
Anion gap: 9 (ref 5–15)
BUN: 18 mg/dL (ref 6–20)
CHLORIDE: 105 mmol/L (ref 98–111)
CO2: 28 mmol/L (ref 22–32)
CREATININE: 0.95 mg/dL (ref 0.61–1.24)
Calcium: 8.9 mg/dL (ref 8.9–10.3)
GFR calc Af Amer: >60 mL/min (ref >60)
GFR calc non Af Amer: >60 mL/min (ref >60)
GLUCOSE: 167 mg/dL — AB (ref 70–99)
Potassium: 3.6 mmol/L (ref 3.5–5.1)
Sodium: 142 mmol/L (ref 135–145)

## 2017-09-23 LAB — CBC WITH DIFFERENTIAL/PLATELET
Basophils Absolute: 0 10*3/uL (ref 0.0–0.1)
Basophils Relative: 1 %
EOS ABS: 0.4 10*3/uL (ref 0.0–0.7)
Eosinophils Relative: 6 %
HCT: 39.2 % (ref 39.0–52.0)
Hemoglobin: 12.8 g/dL — ABNORMAL LOW (ref 13.0–17.0)
Lymphocytes Relative: 24 %
Lymphs Abs: 1.4 10*3/uL (ref 0.7–4.0)
MCH: 27.9 pg (ref 26.0–34.0)
MCHC: 32.7 g/dL (ref 30.0–36.0)
MCV: 85.4 fL (ref 78.0–100.0)
MONO ABS: 0.3 10*3/uL (ref 0.1–1.0)
MONOS PCT: 5 %
Neutro Abs: 3.9 10*3/uL (ref 1.7–7.7)
Neutrophils Relative %: 64 %
Platelets: 232 10*3/uL (ref 150–400)
RBC: 4.59 MIL/uL (ref 4.22–5.81)
RDW: 13.1 % (ref 11.5–15.5)
WBC: 6 10*3/uL (ref 4.0–10.5)

## 2017-09-28 ENCOUNTER — Ambulatory Visit (HOSPITAL_COMMUNITY)
Admission: RE | Admit: 2017-09-28 | Discharge: 2017-09-28 | Disposition: A | Discharge status: Home / Self Care | Payer: Medicare HMO | Source: Ambulatory Visit | Attending: Internal Medicine | Admitting: Internal Medicine

## 2017-09-28 ENCOUNTER — Encounter (HOSPITAL_COMMUNITY): Payer: Self-pay | Admitting: Anesthesiology

## 2017-09-28 ENCOUNTER — Ambulatory Visit (HOSPITAL_COMMUNITY): Payer: Medicare HMO | Admitting: Anesthesiology

## 2017-09-28 ENCOUNTER — Encounter (HOSPITAL_COMMUNITY)
Admission: RE | Disposition: A | Discharge status: Home / Self Care | Payer: Self-pay | Source: Ambulatory Visit | Attending: Internal Medicine

## 2017-09-28 DIAGNOSIS — K5730 Diverticulosis of large intestine without perforation or abscess without bleeding: Secondary | ICD-10-CM | POA: Diagnosis not present

## 2017-09-28 DIAGNOSIS — Z9079 Acquired absence of other genital organ(s): Secondary | ICD-10-CM | POA: Diagnosis not present

## 2017-09-28 DIAGNOSIS — Z1211 Encounter for screening for malignant neoplasm of colon: Secondary | ICD-10-CM | POA: Diagnosis not present

## 2017-09-28 DIAGNOSIS — M1990 Unspecified osteoarthritis, unspecified site: Secondary | ICD-10-CM | POA: Diagnosis not present

## 2017-09-28 DIAGNOSIS — J45909 Unspecified asthma, uncomplicated: Secondary | ICD-10-CM | POA: Diagnosis not present

## 2017-09-28 DIAGNOSIS — I10 Essential (primary) hypertension: Secondary | ICD-10-CM | POA: Insufficient documentation

## 2017-09-28 DIAGNOSIS — G4730 Sleep apnea, unspecified: Secondary | ICD-10-CM | POA: Insufficient documentation

## 2017-09-28 DIAGNOSIS — E781 Pure hyperglyceridemia: Secondary | ICD-10-CM | POA: Diagnosis not present

## 2017-09-28 DIAGNOSIS — Z79899 Other long term (current) drug therapy: Secondary | ICD-10-CM | POA: Insufficient documentation

## 2017-09-28 DIAGNOSIS — K640 First degree hemorrhoids: Secondary | ICD-10-CM | POA: Insufficient documentation

## 2017-09-28 DIAGNOSIS — Z8546 Personal history of malignant neoplasm of prostate: Secondary | ICD-10-CM | POA: Insufficient documentation

## 2017-09-28 DIAGNOSIS — K648 Other hemorrhoids: Secondary | ICD-10-CM | POA: Diagnosis not present

## 2017-09-28 HISTORY — PX: COLONOSCOPY WITH PROPOFOL: SHX5780

## 2017-09-28 SURGERY — COLONOSCOPY WITH PROPOFOL
Anesthesia: Monitor Anesthesia Care

## 2017-09-28 MED ORDER — LACTATED RINGERS IV SOLN: INTRAVENOUS | Status: DC

## 2017-09-28 MED ORDER — IPRATROPIUM-ALBUTEROL 0.5-2.5 (3) MG/3ML IN SOLN: 3.0000 mL | Freq: Four times a day (QID) | RESPIRATORY_TRACT | Status: DC

## 2017-09-28 MED ORDER — HYDROCODONE-ACETAMINOPHEN 7.5-325 MG PO TABS: 1.0000 | ORAL_TABLET | Freq: Once | ORAL | Status: DC | PRN

## 2017-09-28 MED ORDER — MIDAZOLAM HCL 5 MG/5ML IJ SOLN
INTRAMUSCULAR | Status: DC | PRN
  Administered 2017-09-28: 2 mg via INTRAVENOUS

## 2017-09-28 MED ORDER — IPRATROPIUM-ALBUTEROL 0.5-2.5 (3) MG/3ML IN SOLN
3.0000 mL | Freq: Once | RESPIRATORY_TRACT | Status: AC
  Administered 2017-09-28: 3 mL via RESPIRATORY_TRACT

## 2017-09-28 MED ORDER — PROPOFOL 10 MG/ML IV BOLUS
INTRAVENOUS | Status: AC
  Filled 2017-09-28: qty 40

## 2017-09-28 MED ORDER — PROMETHAZINE HCL 25 MG/ML IJ SOLN: 6.2500 mg | INTRAMUSCULAR | Status: DC | PRN

## 2017-09-28 MED ORDER — MIDAZOLAM HCL 2 MG/2ML IJ SOLN
INTRAMUSCULAR | Status: AC
  Filled 2017-09-28: qty 2

## 2017-09-28 MED ORDER — PROPOFOL 500 MG/50ML IV EMUL
INTRAVENOUS | Status: DC | PRN
  Administered 2017-09-28: 125 ug/kg/min via INTRAVENOUS

## 2017-09-28 MED ORDER — PROPOFOL 10 MG/ML IV BOLUS
INTRAVENOUS | Status: DC | PRN
  Administered 2017-09-28 (×7): 20 mg via INTRAVENOUS

## 2017-09-28 MED ORDER — MEPERIDINE HCL 100 MG/ML IJ SOLN: 6.2500 mg | INTRAMUSCULAR | Status: DC | PRN

## 2017-09-28 MED ORDER — HYDROMORPHONE HCL 1 MG/ML IJ SOLN: 0.2500 mg | INTRAMUSCULAR | Status: DC | PRN

## 2017-09-28 MED ORDER — IPRATROPIUM-ALBUTEROL 0.5-2.5 (3) MG/3ML IN SOLN
RESPIRATORY_TRACT | Status: AC
  Filled 2017-09-28: qty 3

## 2017-09-28 MED ORDER — LACTATED RINGERS IV SOLN
INTRAVENOUS | Status: DC
  Administered 2017-09-28: 11:00:00 via INTRAVENOUS

## 2017-09-28 NOTE — Discharge Instructions (Signed)
Colonoscopy Discharge Instructions  Read the instructions outlined below and refer to this sheet in the next few weeks. These discharge instructions provide you with general information on caring for yourself after you leave the hospital. Your doctor may also give you specific instructions. While your treatment has been planned according to the most current medical practices available, unavoidable complications occasionally occur. If you have any problems or questions after discharge, call Dr. Gala Romney at [phone]. ACTIVITY  You may resume your regular activity, but move at a slower pace for the next 24 hours.   Take frequent rest periods for the next 24 hours.   Walking will help get rid of the air and reduce the bloated feeling in your belly (abdomen).   No driving for 24 hours (because of the medicine (anesthesia) used during the test).    Do not sign any important legal documents or operate any machinery for 24 hours (because of the anesthesia used during the test).  NUTRITION  Drink plenty of fluids.   You may resume your normal diet as instructed by your doctor.   Begin with a light meal and progress to your normal diet. Heavy or fried foods are harder to digest and may make you feel sick to your stomach (nauseated).   Avoid alcoholic beverages for 24 hours or as instructed.  MEDICATIONS  You may resume your normal medications unless your doctor tells you otherwise.  WHAT YOU CAN EXPECT TODAY  Some feelings of bloating in the abdomen.   Passage of more gas than usual.   Spotting of blood in your stool or on the toilet paper.  IF YOU HAD POLYPS REMOVED DURING THE COLONOSCOPY:  No aspirin products for 7 days or as instructed.   No alcohol for 7 days or as instructed.   Eat a soft diet for the next 24 hours.  FINDING OUT THE RESULTS OF YOUR TEST Not all test results are available during your visit. If your test results are not back during the visit, make an appointment  with your caregiver to find out the results. Do not assume everything is normal if you have not heard from your caregiver or the medical facility. It is important for you to follow up on all of your test results.  SEEK IMMEDIATE MEDICAL ATTENTION IF:  You have more than a spotting of blood in your stool.   Your belly is swollen (abdominal distention).   You are nauseated or vomiting.   You have a temperature over 101.   You have abdominal pain or discomfort that is severe or gets worse throughout the day.   Diverticulosis information provided  You were found to have diverticulosis day. No evidence of polyp or cancer.  Recommended he have a repeat colonoscopy in 10 years.  Should you develop any interim symptoms such as rectal bleeding, please notify us at once.     Diverticulosis Diverticulosis is a condition that develops when small pouches (diverticula) form in the wall of the large intestine (colon). The colon is where water is absorbed and stool is formed. The pouches form when the inside layer of the colon pushes through weak spots in the outer layers of the colon. You may have a few pouches or many of them. What are the causes? The cause of this condition is not known. What increases the risk? The following factors may make you more likely to develop this condition:  Being older than age 55. Your risk for this condition increases with age.  Diverticulosis is rare among people younger than age 81. By age 56, many people have it.  Eating a low-fiber diet.  Having frequent constipation.  Being overweight.  Not getting enough exercise.  Smoking.  Taking over-the-counter pain medicines, like aspirin and ibuprofen.  Having a family history of diverticulosis.  What are the signs or symptoms? In most people, there are no symptoms of this condition. If you do have symptoms, they may include:  Bloating.  Cramps in the abdomen.  Constipation or diarrhea.  Pain in the  lower left side of the abdomen.  How is this diagnosed? This condition is most often diagnosed during an exam for other colon problems. Because diverticulosis usually has no symptoms, it often cannot be diagnosed independently. This condition may be diagnosed by:  Using a flexible scope to examine the colon (colonoscopy).  Taking an X-ray of the colon after dye has been put into the colon (barium enema).  Doing a CT scan.  How is this treated? You may not need treatment for this condition if you have never developed an infection related to diverticulosis. If you have had an infection before, treatment may include:  Eating a high-fiber diet. This may include eating more fruits, vegetables, and grains.  Taking a fiber supplement.  Taking a live bacteria supplement (probiotic).  Taking medicine to relax your colon.  Taking antibiotic medicines.  Follow these instructions at home:  Drink 6-8 glasses of water or more each day to prevent constipation.  Try not to strain when you have a bowel movement.  If you have had an infection before: ? Eat more fiber as directed by your health care provider or your diet and nutrition specialist (dietitian). ? Take a fiber supplement or probiotic, if your health care provider approves.  Take over-the-counter and prescription medicines only as told by your health care provider.  If you were prescribed an antibiotic, take it as told by your health care provider. Do not stop taking the antibiotic even if you start to feel better.  Keep all follow-up visits as told by your health care provider. This is important. Contact a health care provider if:  You have pain in your abdomen.  You have bloating.  You have cramps.  You have not had a bowel movement in 3 days. Get help right away if:  Your pain gets worse.  Your bloating becomes very bad.  You have a fever or chills, and your symptoms suddenly get worse.  You vomit.  You have  bowel movements that are bloody or black.  You have bleeding from your rectum. Summary  Diverticulosis is a condition that develops when small pouches (diverticula) form in the wall of the large intestine (colon).  You may have a few pouches or many of them.  This condition is most often diagnosed during an exam for other colon problems.  If you have had an infection related to diverticulosis, treatment may include increasing the fiber in your diet, taking supplements, or taking medicines. This information is not intended to replace advice given to you by your health care provider. Make sure you discuss any questions you have with your health care provider. Document Released: 10/25/2003 Document Revised: 12/17/2015 Document Reviewed: 12/17/2015 Elsevier Interactive Patient Education  2017 [place], Care After These instructions provide you with information about caring for yourself after your procedure. Your health care provider may also give you more specific instructions. Your treatment has been planned according to  current medical practices, but problems sometimes occur. Call your health care provider if you have any problems or questions after your procedure. What can I expect after the procedure? After your procedure, it is common to:  Feel sleepy for several hours.  Feel clumsy and have poor balance for several hours.  Feel forgetful about what happened after the procedure.  Have poor judgment for several hours.  Feel nauseous or vomit.  Have a sore throat if you had a breathing tube during the procedure.  Follow these instructions at home: For at least 24 hours after the procedure:   Do not: ? Participate in activities in which you could fall or become injured. ? Drive. ? Use heavy machinery. ? Drink alcohol. ? Take sleeping pills or medicines that cause drowsiness. ? Make important decisions or sign legal documents. ? Take care  of children on your own.  Rest. Eating and drinking  Follow the diet that is recommended by your health care provider.  If you vomit, drink water, juice, or soup when you can drink without vomiting.  Make sure you have little or no nausea before eating solid foods. General instructions  Have a responsible adult stay with you until you are awake and alert.  Take over-the-counter and prescription medicines only as told by your health care provider.  If you smoke, do not smoke without supervision.  Keep all follow-up visits as told by your health care provider. This is important. Contact a health care provider if:  You keep feeling nauseous or you keep vomiting.  You feel light-headed.  You develop a rash.  You have a fever. Get help right away if:  You have trouble breathing. This information is not intended to replace advice given to you by your health care provider. Make sure you discuss any questions you have with your health care provider. Document Released: 05/20/2015 Document Revised: 09/19/2015 Document Reviewed: 05/20/2015 Elsevier Interactive Patient Education  [employer].

## 2017-09-28 NOTE — Anesthesia Preprocedure Evaluation (Signed)
Anesthesia Evaluation  Patient identified by MRN, date of birth, ID band Patient awake    Reviewed: Allergy & Precautions, H&P , NPO status , Patient's Chart, lab work & pertinent test results, reviewed documented beta blocker date and time   History of Anesthesia Complications (+) history of anesthetic complications  Airway Mallampati: III  TM Distance: >3 FB Neck ROM: full    Dental no notable dental hx. (+) Dental Advidsory Given   Pulmonary neg pulmonary ROS, shortness of breath and with exertion, asthma , sleep apnea ,    Pulmonary exam normal breath sounds clear to auscultation       Cardiovascular Exercise Tolerance: Good hypertension, On Medications negative cardio ROS   Rhythm:regular Rate:Normal     Neuro/Psych negative neurological ROS  negative psych ROS   GI/Hepatic negative GI ROS, Neg liver ROS,   Endo/Other  negative endocrine ROS  Renal/GU negative Renal ROS  negative genitourinary   Musculoskeletal  (+) Arthritis ,   Abdominal   Peds  Hematology negative hematology ROS (+) anemia ,   Anesthesia Other Findings Congestion, sinuses.  Did not use inhaler this am or last night. Obesity  Reproductive/Obstetrics negative OB ROS                             Anesthesia Physical Anesthesia Plan  ASA: III  Anesthesia Plan: MAC   Post-op Pain Management:    Induction:   PONV Risk Score and Plan:   Airway Management Planned:   Additional Equipment:   Intra-op Plan:   Post-operative Plan:   Informed Consent: I have reviewed the patients History and Physical, chart, labs and discussed the procedure including the risks, benefits and alternatives for the proposed anesthesia with the patient or authorized representative who has indicated his/her understanding and acceptance.   Dental Advisory Given  Plan Discussed with: CRNA and Anesthesiologist  Anesthesia Plan  Comments:         Anesthesia Quick Evaluation

## 2017-09-28 NOTE — Anesthesia Postprocedure Evaluation (Signed)
Anesthesia Post Note  Patient: Andrew Haley  Procedure(s) Performed: COLONOSCOPY WITH PROPOFOL (N/A )  Patient location during evaluation: PACU Anesthesia Type: MAC Level of consciousness: awake Pain management: pain level controlled Vital Signs Assessment: post-procedure vital signs reviewed and stable Respiratory status: spontaneous breathing Cardiovascular status: stable Postop Assessment: no apparent nausea or vomiting Anesthetic complications: no     Last Vitals:  Vitals:   09/28/17 1400 09/28/17 1415  BP: 138/86 134/90  Pulse: 83 78  Resp:  19  Temp: 36.6 C   SpO2: 97% 100%    Last Pain:  Vitals:   09/28/17 1400  PainSc: 0-No pain                 Everette Rank

## 2017-09-28 NOTE — Op Note (Addendum)
[facility] Patient Name: Andrew Haley Procedure Date: 09/28/2017 1:21 PM MRN: [id number] Date of Birth: 14-Apr-1965 Attending MD: Norvel Richards , MD CSN: [id number] Age: 52 Admit Type: Outpatient Procedure:                Colonoscopy Indications:              Screening for colorectal malignant neoplasm Providers:                Norvel Richards, MD, Jeanann Lewandowsky. Sharon Seller, RN,                            Randa Spike, Technician Referring MD:             Redmond School, MD Medicines:                Propofol per Anesthesia Complications:            No immediate complications. Estimated Blood Loss:     Estimated blood loss: none. Procedure:                Pre-Anesthesia Assessment:                           - Prior to the procedure, a History and Physical                            was performed, and patient medications and                            allergies were reviewed. The patient's tolerance of                            previous anesthesia was also reviewed. The risks                            and benefits of the procedure and the sedation                            options and risks were discussed with the patient.                            All questions were answered, and informed consent                            was obtained. Prior Anticoagulants: The patient has                            taken no previous anticoagulant or antiplatelet                            agents. ASA Grade Assessment: II - A patient with                            mild systemic disease. After reviewing the risks  and benefits, the patient was deemed in                            satisfactory condition to undergo the procedure.                           After obtaining informed consent, the colonoscope                            was passed under direct vision. Throughout the                            procedure, the patient's blood pressure, pulse, and                      oxygen saturations were monitored continuously. The                            CF-HQ190L ([id number]) scope was introduced through                            the and advanced to the the cecum, identified by                            appendiceal orifice and ileocecal valve. The                            colonoscopy was performed without difficulty. The                            patient tolerated the procedure well. The quality                            of the bowel preparation was adequate. The                            ileocecal valve, appendiceal orifice, and rectum                            were photographed. The entire colon was well                            visualized. Scope In: 1:35:25 PM Scope Out: 1:52:08 PM Scope Withdrawal Time: 0 hours 12 minutes 50 seconds  Total Procedure Duration: 0 hours 16 minutes 43 seconds  Findings:      Internal hemorrhoids were found during retroflexion. The hemorrhoids       were moderate, medium-sized and Grade I (internal hemorrhoids that do       not prolapse).      Multiple small and large-mouthed diverticula were found in the sigmoid       colon and descending colon.      The exam was otherwise without abnormality on direct and retroflexion       views. Impression:               - Internal hemorrhoids.                           -  Diverticulosis in the sigmoid colon and in the                            descending colon.                           - The examination was otherwise normal on direct                            and retroflexion views.                           - No specimens collected. Moderate Sedation:      Moderate (conscious) sedation was personally administered by an       anesthesia professional. The following parameters were monitored: oxygen       saturation, heart rate, blood pressure, respiratory rate, EKG, adequacy       of pulmonary ventilation, and response to care. Total physician        intraservice time was 20 minutes. Recommendation:           - Patient has a contact number available for                            emergencies. The signs and symptoms of potential                            delayed complications were discussed with the                            patient. Return to normal activities tomorrow.                            Written discharge instructions were provided to the                            patient.                           - Resume previous diet.                           - Continue present medications. repeat screening                            coloncopy in 10 years. if you develop any interim                            symptoms, such as rectal bleeding, please let us                            know. Procedure Code(s):        --- Professional ---                           [id number], Colonoscopy, flexible; diagnostic, including  collection of specimen(s) by brushing or washing,                            when performed (separate procedure) Diagnosis Code(s):        --- Professional ---                           Z12.11, Encounter for screening for malignant                            neoplasm of colon                           K64.0, First degree hemorrhoids                           K57.30, Diverticulosis of large intestine without                            perforation or abscess without bleeding CPT copyright 2018 American Medical Association. All rights reserved. The codes documented in this report are preliminary and upon coder review may  be revised to meet current compliance requirements. [name]. [name], MD Norvel Richards, MD 09/28/2017 1:58:43 PM This report has been signed electronically. Number of Addenda: 0

## 2017-09-28 NOTE — Progress Notes (Signed)
CC'D TO PCP °

## 2017-09-28 NOTE — H&P (Signed)
@LOGO @   Primary Care Physician:  Redmond School, MD Primary Gastroenterologist:  Dr. Gala Romney  Pre-Procedure History & Physical: HPI:  Andrew Haley is a 52 y.o. male is here for a screening colonoscopy.   No bowel symptoms. No family history of colon cancer. No prior colonoscopy.  Past Medical History:  Diagnosis Date  . Arthritis   . Complication of anesthesia    slow to wake up  . Hypertension   . Hypertriglyceridemia   . Numbness of fingers of both hands   . Prostate cancer ([place]) 2017  . Sleep apnea    CPAP    Past Surgical History:  Procedure Laterality Date  . HAND SURGERY Right 2003   cysts   . LESION REMOVAL Left 09/23/2013   Procedure: MINOR EXCISION 3 CM SKIN LESION OF LEFT THIGH ;  Surgeon: Jamesetta So, MD;  Location: [facility];  Service: General;  Laterality: Left;  . LYMPHADENECTOMY Bilateral 11/29/2015   Procedure: LYMPHADENECTOMY;  Surgeon: Raynelle Bring, MD;  Location: [facility];  Service: Urology;  Laterality: Bilateral;  . ROBOT ASSISTED LAPAROSCOPIC RADICAL PROSTATECTOMY N/A 11/29/2015   Procedure: XI ROBOTIC ASSISTED LAPAROSCOPIC RADICAL PROSTATECTOMY LEVEL 3;  Surgeon: Raynelle Bring, MD;  Location: [facility];  Service: Urology;  Laterality: N/A;  . TOTAL KNEE ARTHROPLASTY Left 02/22/2014   Procedure: LEFT TOTAL KNEE ARTHROPLASTY;  Surgeon: Tobi Bastos, MD;  Location: [facility];  Service: Orthopedics;  Laterality: Left;  . TOTAL KNEE ARTHROPLASTY Right 05/23/2014   Procedure: RIGHT TOTAL KNEE ARTHROPLASTY;  Surgeon: Latanya Maudlin, MD;  Location: [facility];  Service: Orthopedics;  Laterality: Right;    Prior to Admission medications   Medication Sig Start Date End Date Taking? Authorizing Provider  diclofenac (VOLTAREN) 75 MG EC tablet Take 150 mg by mouth daily.  07/23/17  Yes [provider]  HYDROcodone-acetaminophen (NORCO) 10-325 MG tablet Take 1 tablet by mouth See admin instructions. Take 1 tablet every morning and may take a second dose during the day  as needed for pain 07/03/17  Yes [provider]  lisinopril-hydrochlorothiazide (PRINZIDE,ZESTORETIC) 20-12.5 MG per tablet Take 1 tablet by mouth every morning.   Yes [provider]    Allergies as of 07/30/2017  . (No Known Allergies)    Family History  Problem Relation Age of Onset  . Heart disease Mother   . Throat cancer Father   . Colon cancer Neg Hx   . Colon polyps Neg Hx     Social History   Socioeconomic History  . Marital status: Single    Spouse name: Not on file  . Number of children: Not on file  . Years of education: Not on file  . Highest education level: Not on file  Occupational History  . Occupation: disability  Social Needs  . Financial resource strain: Not on file  . Food insecurity:    Worry: Not on file    Inability: Not on file  . Transportation needs:    Medical: Not on file    Non-medical: Not on file  Tobacco Use  . Smoking status: Never Smoker  . Smokeless tobacco: Never Used  Substance and Sexual Activity  . Alcohol use: Yes    Comment: OCCASIONAL  . Drug use: No  . Sexual activity: Yes    Birth control/protection: None  Lifestyle  . Physical activity:    Days per week: Not on file    Minutes per session: Not on file  . Stress: Not on file  Relationships  . Social connections:    Talks on phone: Not on file    Gets together: Not on file    Attends religious service: Not on file    Active member of club or organization: Not on file    Attends meetings of clubs or organizations: Not on file    Relationship status: Not on file  . Intimate partner violence:    Fear of current or ex partner: Not on file    Emotionally abused: Not on file    Physically abused: Not on file    Forced sexual activity: Not on file  Other Topics Concern  . Not on file  Social History Narrative  . Not on file    Review of Systems: See HPI, otherwise negative ROS  Physical Exam: BP (!) 147/89 (BP Location: Left Arm)   Pulse 73    Temp 97.8 F (36.6 C)   Resp 12   SpO2 100%  General:   Alert,  Well-developed, well-nourished, pleasant and cooperative in NAD Lungs:  Clear throughout to auscultation.   No wheezes, crackles, or rhonchi. No acute distress. Heart:  Regular rate and rhythm; no murmurs, clicks, rubs,  or gallops. Abdomen:  Soft, nontender and nondistended. No masses, hepatosplenomegaly or hernias noted. Normal bowel sounds, without guarding, and without rebound.    Impression/Plan: Andrew Haley is now here to undergo a screening colonoscopy.  First-ever average or screening examination.  Risks, benefits, limitations, imponderables and alternatives regarding colonoscopy have been reviewed with the patient. Questions have been answered. All parties agreeable.     Notice:  This dictation was prepared with Dragon dictation along with smaller phrase technology. Any transcriptional errors that result from this process are unintentional and may not be corrected upon review.

## 2017-09-28 NOTE — Transfer of Care (Signed)
Immediate Anesthesia Transfer of Care Note  Patient: Andrew Haley  Procedure(s) Performed: COLONOSCOPY WITH PROPOFOL (N/A )  Patient Location: PACU  Anesthesia Type:MAC  Level of Consciousness: awake and patient cooperative  Airway & Oxygen Therapy: Patient Spontanous Breathing and Patient connected to nasal cannula oxygen  Post-op Assessment: Report given to RN and Post -op Vital signs reviewed and stable  Post vital signs: Reviewed and stable  Last Vitals:  Vitals Value Taken Time  BP    Temp    Pulse 85 09/28/2017  2:02 PM  Resp 15 09/28/2017  2:02 PM  SpO2 97 % 09/28/2017  2:02 PM  Vitals shown include unvalidated device data.  Last Pain:  Vitals:   09/28/17 1328  PainSc: 0-No pain         Complications: No apparent anesthesia complications

## 2017-10-01 ENCOUNTER — Encounter (HOSPITAL_COMMUNITY): Payer: Self-pay | Admitting: Internal Medicine

## 2017-10-07 DIAGNOSIS — Z6841 Body Mass Index (BMI) 40.0 and over, adult: Secondary | ICD-10-CM | POA: Diagnosis not present

## 2017-10-07 DIAGNOSIS — G894 Chronic pain syndrome: Secondary | ICD-10-CM | POA: Diagnosis not present

## 2017-10-07 DIAGNOSIS — M170 Bilateral primary osteoarthritis of knee: Secondary | ICD-10-CM | POA: Diagnosis not present

## 2017-10-07 DIAGNOSIS — J019 Acute sinusitis, unspecified: Secondary | ICD-10-CM | POA: Diagnosis not present

## 2017-12-02 DIAGNOSIS — G894 Chronic pain syndrome: Secondary | ICD-10-CM | POA: Diagnosis not present

## 2017-12-02 DIAGNOSIS — M170 Bilateral primary osteoarthritis of knee: Secondary | ICD-10-CM | POA: Diagnosis not present

## 2017-12-02 DIAGNOSIS — Z6841 Body Mass Index (BMI) 40.0 and over, adult: Secondary | ICD-10-CM | POA: Diagnosis not present

## 2017-12-02 DIAGNOSIS — C61 Malignant neoplasm of prostate: Secondary | ICD-10-CM | POA: Diagnosis not present

## 2017-12-02 DIAGNOSIS — Z23 Encounter for immunization: Secondary | ICD-10-CM | POA: Diagnosis not present

## 2017-12-02 DIAGNOSIS — Z1389 Encounter for screening for other disorder: Secondary | ICD-10-CM | POA: Diagnosis not present

## 2017-12-02 DIAGNOSIS — R5383 Other fatigue: Secondary | ICD-10-CM | POA: Diagnosis not present

## 2017-12-02 DIAGNOSIS — Z0000 Encounter for general adult medical examination without abnormal findings: Secondary | ICD-10-CM | POA: Diagnosis not present

## 2017-12-02 DIAGNOSIS — Z0001 Encounter for general adult medical examination with abnormal findings: Secondary | ICD-10-CM | POA: Diagnosis not present

## 2017-12-25 DIAGNOSIS — C61 Malignant neoplasm of prostate: Secondary | ICD-10-CM | POA: Diagnosis not present

## 2018-04-07 DIAGNOSIS — Z6841 Body Mass Index (BMI) 40.0 and over, adult: Secondary | ICD-10-CM | POA: Diagnosis not present

## 2018-04-07 DIAGNOSIS — M170 Bilateral primary osteoarthritis of knee: Secondary | ICD-10-CM | POA: Diagnosis not present

## 2018-04-07 DIAGNOSIS — G894 Chronic pain syndrome: Secondary | ICD-10-CM | POA: Diagnosis not present

## 2018-04-07 DIAGNOSIS — E291 Testicular hypofunction: Secondary | ICD-10-CM | POA: Diagnosis not present

## 2018-05-03 ENCOUNTER — Other Ambulatory Visit: Payer: Self-pay

## 2018-05-03 ENCOUNTER — Emergency Department (HOSPITAL_COMMUNITY)
Admission: EM | Admit: 2018-05-03 | Discharge: 2018-05-03 | Disposition: A | Discharge status: Home / Self Care | Payer: Medicare HMO | Attending: Emergency Medicine | Admitting: Emergency Medicine

## 2018-05-03 ENCOUNTER — Encounter (HOSPITAL_COMMUNITY): Payer: Self-pay | Admitting: Emergency Medicine

## 2018-05-03 ENCOUNTER — Emergency Department (HOSPITAL_COMMUNITY): Payer: Medicare HMO

## 2018-05-03 DIAGNOSIS — Z791 Long term (current) use of non-steroidal anti-inflammatories (NSAID): Secondary | ICD-10-CM | POA: Diagnosis not present

## 2018-05-03 DIAGNOSIS — M545 Low back pain: Secondary | ICD-10-CM | POA: Diagnosis present

## 2018-05-03 DIAGNOSIS — I10 Essential (primary) hypertension: Secondary | ICD-10-CM | POA: Diagnosis not present

## 2018-05-03 DIAGNOSIS — M5441 Lumbago with sciatica, right side: Secondary | ICD-10-CM | POA: Diagnosis not present

## 2018-05-03 DIAGNOSIS — M5431 Sciatica, right side: Secondary | ICD-10-CM | POA: Diagnosis not present

## 2018-05-03 DIAGNOSIS — S3992XA Unspecified injury of lower back, initial encounter: Secondary | ICD-10-CM | POA: Diagnosis not present

## 2018-05-03 DIAGNOSIS — Z79899 Other long term (current) drug therapy: Secondary | ICD-10-CM | POA: Insufficient documentation

## 2018-05-03 MED ORDER — KETOROLAC TROMETHAMINE 30 MG/ML IJ SOLN
30.0000 mg | Freq: Once | INTRAMUSCULAR | Status: AC
  Administered 2018-05-03: 30 mg via INTRAMUSCULAR
  Filled 2018-05-03: qty 1

## 2018-05-03 MED ORDER — HYDROMORPHONE HCL 1 MG/ML IJ SOLN
1.0000 mg | Freq: Once | INTRAMUSCULAR | Status: AC
  Administered 2018-05-03: 1 mg via INTRAMUSCULAR
  Filled 2018-05-03: qty 1

## 2018-05-03 MED ORDER — METHOCARBAMOL 750 MG PO TABS: 750.0000 mg | ORAL_TABLET | Freq: Four times a day (QID) | ORAL | 0 refills | Status: DC

## 2018-05-03 NOTE — ED Provider Notes (Signed)
[facility] EMERGENCY DEPARTMENT Provider Note   CSN: [id number] Arrival date & time: 05/03/18  1353    History   Chief Complaint Chief Complaint  Patient presents with  . Back Pain    HPI Andrew Haley is a 53 y.o. male with a history of HTN, hypertriglyceridemia, arthritis and chronic bilateral knee pain s/p bil knee arthroscopy presenting with right lower back pain which he woke with late last night.  He reports severe pain with radiation into the right buttock and posterior thigh which is worsened with movement and attempts at standing.  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   There has been no weakness or numbness in the lower extremities and no urinary or bowel retention or incontinence.  Patient does have a history of prostate cancer.  The patient takes diclofenac daily and prn hydrocodone, usually not daily, but has run out of this medicine.  He is scheduled to see his pcp tomorrow.      The history is provided by the patient.    Past Medical History:  Diagnosis Date  . Arthritis   . Complication of anesthesia    slow to wake up  . Hypertension   . Hypertriglyceridemia   . Numbness of fingers of both hands   . Prostate cancer ([place]) 2017  . Sleep apnea    CPAP    Patient Active Problem List   Diagnosis Date Noted  . Encounter for screening colonoscopy 07/30/2017  . Prostate cancer ([place]) 11/29/2015  . Postoperative anemia due to acute blood loss 05/25/2014  . History of total knee arthroplasty 05/23/2014  . H/O total knee replacement 02/22/2014    Past Surgical History:  Procedure Laterality Date  . COLONOSCOPY WITH PROPOFOL N/A 09/28/2017   Procedure: COLONOSCOPY WITH PROPOFOL;  Surgeon: Daneil Dolin, MD;  Location: [facility];  Service: Endoscopy;  Laterality: N/A;  12:00pm  . HAND SURGERY Right 2003   cysts    . LESION REMOVAL Left 09/23/2013   Procedure: MINOR EXCISION 3 CM SKIN LESION OF LEFT THIGH ;  Surgeon: Jamesetta So, MD;  Location: [facility];  Service: General;  Laterality: Left;  . LYMPHADENECTOMY Bilateral 11/29/2015   Procedure: LYMPHADENECTOMY;  Surgeon: Raynelle Bring, MD;  Location: [facility];  Service: Urology;  Laterality: Bilateral;  . ROBOT ASSISTED LAPAROSCOPIC RADICAL PROSTATECTOMY N/A 11/29/2015   Procedure: XI ROBOTIC ASSISTED LAPAROSCOPIC RADICAL PROSTATECTOMY LEVEL 3;  Surgeon: Raynelle Bring, MD;  Location: [facility];  Service: Urology;  Laterality: N/A;  . TOTAL KNEE ARTHROPLASTY Left 02/22/2014   Procedure: LEFT TOTAL KNEE ARTHROPLASTY;  Surgeon: Tobi Bastos, MD;  Location: [facility];  Service: Orthopedics;  Laterality: Left;  . TOTAL KNEE ARTHROPLASTY Right 05/23/2014   Procedure: RIGHT TOTAL KNEE ARTHROPLASTY;  Surgeon: Latanya Maudlin, MD;  Location: [facility];  Service: Orthopedics;  Laterality: Right;        Home Medications    Prior to Admission medications   Medication Sig Start Date End Date Taking? Authorizing Provider  diclofenac (VOLTAREN) 75 MG EC tablet Take 150 mg by mouth daily.  07/23/17   [provider]  HYDROcodone-acetaminophen (NORCO) 10-325 MG tablet Take 1 tablet by mouth See admin instructions. Take 1 tablet every morning and may take a second dose during the day as needed for pain 07/03/17  [provider]  lisinopril-hydrochlorothiazide (PRINZIDE,ZESTORETIC) 20-12.5 MG per tablet Take 1 tablet by mouth every morning.    [provider]  methocarbamol (ROBAXIN-750) 750 MG tablet Take 1 tablet (750 mg total) by mouth 4 (four) times daily. 05/03/18   Evalee Jefferson, PA-C    Family History Family History  Problem Relation Age of Onset  . Heart disease Mother   . Throat cancer Father   . Colon cancer Neg Hx   . Colon polyps Neg Hx     Social History Social History   Tobacco Use  . Smoking status: Never Smoker  . Smokeless  tobacco: Never Used  Substance Use Topics  . Alcohol use: Yes    Comment: OCCASIONAL  . Drug use: No     Allergies   Patient has no known allergies.   Review of Systems Review of Systems   Physical Exam Updated Vital Signs BP (!) 143/107 (BP Location: Right Arm)   Pulse 100   Temp 98.3 F (36.8 C) (Oral)   Resp 20   Ht 6\' 3"  (1.905 m)   Wt 133.8 kg   SpO2 99%   BMI 36.87 kg/m   Physical Exam Vitals signs and nursing note reviewed.  Constitutional:      Appearance: He is well-developed.  HENT:     Head: Normocephalic.  Eyes:     Conjunctiva/sclera: Conjunctivae normal.  Neck:     Musculoskeletal: Normal range of motion and neck supple.  Cardiovascular:     Rate and Rhythm: Normal rate.     Comments: Pedal pulses normal. Pulmonary:     Effort: Pulmonary effort is normal.  Abdominal:     General: Bowel sounds are normal. There is no distension.     Palpations: Abdomen is soft. There is no mass.  Musculoskeletal: Normal range of motion.     Lumbar back: He exhibits tenderness. He exhibits no swelling, no edema and no spasm.  Skin:    General: Skin is warm and dry.  Neurological:     Mental Status: He is alert.     Sensory: No sensory deficit.     Motor: No tremor or atrophy.     Gait: Gait normal.     Deep Tendon Reflexes:     Reflex Scores:      Patellar reflexes are 1+ on the right side and 1+ on the left side.      Achilles reflexes are 1+ on the right side and 1+ on the left side.    Comments: No strength deficit noted in hip and knee flexor and extensor muscle groups.  Ankle flexion and extension intact.       ED Treatments / Results  Labs (all labs ordered are listed, but only abnormal results are displayed) Labs Reviewed - No data to display  EKG None  Radiology Dg Lumbar Spine Complete  Result Date: 05/03/2018 CLINICAL DATA:  Status post fall 4 days ago. EXAM: LUMBAR SPINE - COMPLETE 4+ VIEW COMPARISON:  None. FINDINGS: There is no  evidence of lumbar spine fracture. Alignment is normal. Degenerative disc disease with disc height loss at L3-4 and L4-5 with bilateral facet arthropathy. Bilateral facet arthropathy at L5-S1. IMPRESSION: No acute osseous injury of the lumbar spine. Electronically Signed   By: Kathreen Devoid   On: 05/03/2018 15:47    Procedures Procedures (including critical care time)  Medications Ordered in ED Medications  HYDROmorphone (DILAUDID) injection 1 mg (1 mg Intramuscular Given 05/03/18 1546)  ketorolac (TORADOL) 30  MG/ML injection 30 mg (30 mg Intramuscular Given 05/03/18 1545)     Initial Impression / Assessment and Plan / ED Course  I have reviewed the triage vital signs and the nursing notes.  Pertinent labs & imaging results that were available during my care of the patient were reviewed by me and considered in my medical decision making (see chart for details).         Patient was given an IM dose of Dilaudid and Toradol with significant improvement in his pain symptoms.  He was able to ambulate without deficit after this treatment.  The narcotic database was reviewed and he last received 180 hydrocodone tablets on February 26.  When the patient was asked about this, he states that perhaps he does have a bottle in his safe, he will have to go home and look.  He also has a refill of his diclofenac waiting for him at his pharmacy.  I have added Robaxin.  We discussed heat therapy.  He already has follow-up care with his PCP for recheck in 1 day.   I am unable to reproduce patellar reflexes bilaterally, however he has had bilateral total knee arthroplasty which I suspect is the source of this reflex deficit.  No urinary or bowel retention or incontinence.  Patient is ambulatory, no foot drop. Final Clinical Impressions(s) / ED Diagnoses   Final diagnoses:  Sciatica of right side    ED Discharge Orders         Ordered    methocarbamol (ROBAXIN-750) 750 MG tablet  4 times daily     05/03/18  1633           Evalee Jefferson, PA-C 05/03/18 1711    Maudie Flakes, MD 05/05/18 1122

## 2018-05-03 NOTE — ED Notes (Signed)
After triage, patient states "I remember about 5 days ago someone threw a bale of hay and hit the back of my legs and knocked me flat on my back but it hasn't hurt until today." Patient tearful at triage.

## 2018-05-03 NOTE — Discharge Instructions (Addendum)
Get your diclofenac filled as discussed and get this started.  Add the muscle relaxer and try using a heating pad for 20 minutes 3-4 times daily over your lower back and right hip/buttock area where the sciatic nerve pain is most prominent.  Keep your appointment with [facility] tomorrow.  Avoid any activity that worsens your pain.

## 2018-05-03 NOTE — ED Triage Notes (Signed)
Patient complaining of severe lower back pain starting this morning. Denies known injury. 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

## 2018-05-04 ENCOUNTER — Other Ambulatory Visit (HOSPITAL_COMMUNITY): Payer: Self-pay | Admitting: Physician Assistant

## 2018-05-04 DIAGNOSIS — Z1389 Encounter for screening for other disorder: Secondary | ICD-10-CM | POA: Diagnosis not present

## 2018-05-04 DIAGNOSIS — M5410 Radiculopathy, site unspecified: Secondary | ICD-10-CM | POA: Diagnosis not present

## 2018-05-04 DIAGNOSIS — M5126 Other intervertebral disc displacement, lumbar region: Secondary | ICD-10-CM | POA: Diagnosis not present

## 2018-05-04 DIAGNOSIS — Z6839 Body mass index (BMI) 39.0-39.9, adult: Secondary | ICD-10-CM | POA: Diagnosis not present

## 2018-05-05 ENCOUNTER — Other Ambulatory Visit: Payer: Self-pay

## 2018-05-05 ENCOUNTER — Other Ambulatory Visit (HOSPITAL_COMMUNITY): Payer: Self-pay | Admitting: Physician Assistant

## 2018-05-05 ENCOUNTER — Ambulatory Visit (HOSPITAL_COMMUNITY)
Admission: RE | Admit: 2018-05-05 | Discharge: 2018-05-05 | Disposition: A | Discharge status: Home / Self Care | Payer: Medicare HMO | Source: Ambulatory Visit | Attending: Physician Assistant | Admitting: Physician Assistant

## 2018-05-05 DIAGNOSIS — Z77018 Contact with and (suspected) exposure to other hazardous metals: Secondary | ICD-10-CM | POA: Diagnosis not present

## 2018-05-05 DIAGNOSIS — M795 Residual foreign body in soft tissue: Secondary | ICD-10-CM

## 2018-05-05 DIAGNOSIS — M48061 Spinal stenosis, lumbar region without neurogenic claudication: Secondary | ICD-10-CM | POA: Diagnosis not present

## 2018-05-05 DIAGNOSIS — M5410 Radiculopathy, site unspecified: Secondary | ICD-10-CM | POA: Insufficient documentation

## 2018-05-05 DIAGNOSIS — M5126 Other intervertebral disc displacement, lumbar region: Secondary | ICD-10-CM | POA: Diagnosis not present

## 2018-05-05 DIAGNOSIS — M47817 Spondylosis without myelopathy or radiculopathy, lumbosacral region: Secondary | ICD-10-CM | POA: Diagnosis not present

## 2018-05-20 DIAGNOSIS — M5136 Other intervertebral disc degeneration, lumbar region: Secondary | ICD-10-CM | POA: Diagnosis not present

## 2018-06-02 DIAGNOSIS — B029 Zoster without complications: Secondary | ICD-10-CM | POA: Diagnosis not present

## 2018-06-02 DIAGNOSIS — Z6839 Body mass index (BMI) 39.0-39.9, adult: Secondary | ICD-10-CM | POA: Diagnosis not present

## 2018-06-02 DIAGNOSIS — G894 Chronic pain syndrome: Secondary | ICD-10-CM | POA: Diagnosis not present

## 2018-06-30 DIAGNOSIS — G894 Chronic pain syndrome: Secondary | ICD-10-CM | POA: Diagnosis not present

## 2018-06-30 DIAGNOSIS — M179 Osteoarthritis of knee, unspecified: Secondary | ICD-10-CM | POA: Diagnosis not present

## 2018-07-29 DIAGNOSIS — G894 Chronic pain syndrome: Secondary | ICD-10-CM | POA: Diagnosis not present

## 2018-07-29 DIAGNOSIS — M1991 Primary osteoarthritis, unspecified site: Secondary | ICD-10-CM | POA: Diagnosis not present

## 2018-07-29 DIAGNOSIS — M170 Bilateral primary osteoarthritis of knee: Secondary | ICD-10-CM | POA: Diagnosis not present

## 2018-08-11 DIAGNOSIS — C61 Malignant neoplasm of prostate: Secondary | ICD-10-CM | POA: Diagnosis not present

## 2018-08-11 DIAGNOSIS — N5201 Erectile dysfunction due to arterial insufficiency: Secondary | ICD-10-CM | POA: Diagnosis not present

## 2018-08-26 DIAGNOSIS — J683 Other acute and subacute respiratory conditions due to chemicals, gases, fumes and vapors: Secondary | ICD-10-CM | POA: Diagnosis not present

## 2018-08-26 DIAGNOSIS — G894 Chronic pain syndrome: Secondary | ICD-10-CM | POA: Diagnosis not present

## 2018-08-26 DIAGNOSIS — M170 Bilateral primary osteoarthritis of knee: Secondary | ICD-10-CM | POA: Diagnosis not present

## 2018-08-26 DIAGNOSIS — M1991 Primary osteoarthritis, unspecified site: Secondary | ICD-10-CM | POA: Diagnosis not present

## 2018-09-29 DIAGNOSIS — R0600 Dyspnea, unspecified: Secondary | ICD-10-CM | POA: Diagnosis not present

## 2018-09-29 DIAGNOSIS — G894 Chronic pain syndrome: Secondary | ICD-10-CM | POA: Diagnosis not present

## 2018-10-28 DIAGNOSIS — G894 Chronic pain syndrome: Secondary | ICD-10-CM | POA: Diagnosis not present

## 2018-10-28 DIAGNOSIS — C61 Malignant neoplasm of prostate: Secondary | ICD-10-CM | POA: Diagnosis not present

## 2018-10-28 DIAGNOSIS — M170 Bilateral primary osteoarthritis of knee: Secondary | ICD-10-CM | POA: Diagnosis not present

## 2018-11-10 DIAGNOSIS — M179 Osteoarthritis of knee, unspecified: Secondary | ICD-10-CM | POA: Diagnosis not present

## 2018-11-10 DIAGNOSIS — G894 Chronic pain syndrome: Secondary | ICD-10-CM | POA: Diagnosis not present

## 2018-11-10 DIAGNOSIS — I10 Essential (primary) hypertension: Secondary | ICD-10-CM | POA: Diagnosis not present

## 2018-11-10 DIAGNOSIS — C61 Malignant neoplasm of prostate: Secondary | ICD-10-CM | POA: Diagnosis not present

## 2018-11-24 DIAGNOSIS — G894 Chronic pain syndrome: Secondary | ICD-10-CM | POA: Diagnosis not present

## 2018-12-14 DIAGNOSIS — Z6841 Body Mass Index (BMI) 40.0 and over, adult: Secondary | ICD-10-CM | POA: Diagnosis not present

## 2018-12-14 DIAGNOSIS — E291 Testicular hypofunction: Secondary | ICD-10-CM | POA: Diagnosis not present

## 2018-12-14 DIAGNOSIS — C61 Malignant neoplasm of prostate: Secondary | ICD-10-CM | POA: Diagnosis not present

## 2018-12-14 DIAGNOSIS — Z1389 Encounter for screening for other disorder: Secondary | ICD-10-CM | POA: Diagnosis not present

## 2018-12-14 DIAGNOSIS — Z0001 Encounter for general adult medical examination with abnormal findings: Secondary | ICD-10-CM | POA: Diagnosis not present

## 2018-12-14 DIAGNOSIS — Z23 Encounter for immunization: Secondary | ICD-10-CM | POA: Diagnosis not present

## 2018-12-14 DIAGNOSIS — G894 Chronic pain syndrome: Secondary | ICD-10-CM | POA: Diagnosis not present

## 2019-01-14 DIAGNOSIS — M25572 Pain in left ankle and joints of left foot: Secondary | ICD-10-CM | POA: Diagnosis not present

## 2019-01-19 DIAGNOSIS — M1991 Primary osteoarthritis, unspecified site: Secondary | ICD-10-CM | POA: Diagnosis not present

## 2019-01-19 DIAGNOSIS — G894 Chronic pain syndrome: Secondary | ICD-10-CM | POA: Diagnosis not present

## 2019-02-21 DIAGNOSIS — M25572 Pain in left ankle and joints of left foot: Secondary | ICD-10-CM | POA: Diagnosis not present

## 2019-02-23 DIAGNOSIS — G894 Chronic pain syndrome: Secondary | ICD-10-CM | POA: Diagnosis not present

## 2019-02-23 DIAGNOSIS — M1991 Primary osteoarthritis, unspecified site: Secondary | ICD-10-CM | POA: Diagnosis not present

## 2019-02-23 DIAGNOSIS — I10 Essential (primary) hypertension: Secondary | ICD-10-CM | POA: Diagnosis not present

## 2019-03-21 DIAGNOSIS — M25572 Pain in left ankle and joints of left foot: Secondary | ICD-10-CM | POA: Diagnosis not present

## 2019-03-22 DIAGNOSIS — G894 Chronic pain syndrome: Secondary | ICD-10-CM | POA: Diagnosis not present

## 2019-04-21 DIAGNOSIS — G894 Chronic pain syndrome: Secondary | ICD-10-CM | POA: Diagnosis not present

## 2019-04-21 DIAGNOSIS — N529 Male erectile dysfunction, unspecified: Secondary | ICD-10-CM | POA: Diagnosis not present

## 2019-05-11 DIAGNOSIS — F4542 Pain disorder with related psychological factors: Secondary | ICD-10-CM | POA: Diagnosis not present

## 2019-05-11 DIAGNOSIS — I10 Essential (primary) hypertension: Secondary | ICD-10-CM | POA: Diagnosis not present

## 2019-05-11 DIAGNOSIS — G894 Chronic pain syndrome: Secondary | ICD-10-CM | POA: Diagnosis not present

## 2019-06-20 DIAGNOSIS — G894 Chronic pain syndrome: Secondary | ICD-10-CM | POA: Diagnosis not present

## 2019-07-25 DIAGNOSIS — G894 Chronic pain syndrome: Secondary | ICD-10-CM | POA: Diagnosis not present

## 2019-08-12 DIAGNOSIS — M25572 Pain in left ankle and joints of left foot: Secondary | ICD-10-CM | POA: Diagnosis not present

## 2019-08-22 DIAGNOSIS — G894 Chronic pain syndrome: Secondary | ICD-10-CM | POA: Diagnosis not present

## 2019-09-26 DIAGNOSIS — L989 Disorder of the skin and subcutaneous tissue, unspecified: Secondary | ICD-10-CM | POA: Diagnosis not present

## 2019-09-26 DIAGNOSIS — G894 Chronic pain syndrome: Secondary | ICD-10-CM | POA: Diagnosis not present

## 2019-09-26 DIAGNOSIS — M1991 Primary osteoarthritis, unspecified site: Secondary | ICD-10-CM | POA: Diagnosis not present

## 2019-09-26 DIAGNOSIS — Z6836 Body mass index (BMI) 36.0-36.9, adult: Secondary | ICD-10-CM | POA: Diagnosis not present

## 2019-09-26 DIAGNOSIS — E6609 Other obesity due to excess calories: Secondary | ICD-10-CM | POA: Diagnosis not present

## 2019-09-26 DIAGNOSIS — I10 Essential (primary) hypertension: Secondary | ICD-10-CM | POA: Diagnosis not present

## 2019-09-29 ENCOUNTER — Ambulatory Visit: Payer: Medicare HMO

## 2019-10-11 DIAGNOSIS — G894 Chronic pain syndrome: Secondary | ICD-10-CM | POA: Diagnosis not present

## 2019-10-11 DIAGNOSIS — I10 Essential (primary) hypertension: Secondary | ICD-10-CM | POA: Diagnosis not present

## 2019-10-11 DIAGNOSIS — F4542 Pain disorder with related psychological factors: Secondary | ICD-10-CM | POA: Diagnosis not present

## 2019-10-26 DIAGNOSIS — M170 Bilateral primary osteoarthritis of knee: Secondary | ICD-10-CM | POA: Diagnosis not present

## 2019-10-26 DIAGNOSIS — G894 Chronic pain syndrome: Secondary | ICD-10-CM | POA: Diagnosis not present

## 2019-10-26 DIAGNOSIS — M179 Osteoarthritis of knee, unspecified: Secondary | ICD-10-CM | POA: Diagnosis not present

## 2019-10-26 DIAGNOSIS — C61 Malignant neoplasm of prostate: Secondary | ICD-10-CM | POA: Diagnosis not present

## 2019-10-28 DIAGNOSIS — M25572 Pain in left ankle and joints of left foot: Secondary | ICD-10-CM | POA: Diagnosis not present

## 2019-11-10 DIAGNOSIS — F4542 Pain disorder with related psychological factors: Secondary | ICD-10-CM | POA: Diagnosis not present

## 2019-11-10 DIAGNOSIS — I10 Essential (primary) hypertension: Secondary | ICD-10-CM | POA: Diagnosis not present

## 2019-11-10 DIAGNOSIS — G894 Chronic pain syndrome: Secondary | ICD-10-CM | POA: Diagnosis not present

## 2019-11-23 DIAGNOSIS — M179 Osteoarthritis of knee, unspecified: Secondary | ICD-10-CM | POA: Diagnosis not present

## 2019-11-23 DIAGNOSIS — G894 Chronic pain syndrome: Secondary | ICD-10-CM | POA: Diagnosis not present

## 2019-11-23 DIAGNOSIS — Z6835 Body mass index (BMI) 35.0-35.9, adult: Secondary | ICD-10-CM | POA: Diagnosis not present

## 2019-11-23 DIAGNOSIS — M1991 Primary osteoarthritis, unspecified site: Secondary | ICD-10-CM | POA: Diagnosis not present

## 2019-11-30 DIAGNOSIS — M170 Bilateral primary osteoarthritis of knee: Secondary | ICD-10-CM | POA: Diagnosis not present

## 2019-11-30 DIAGNOSIS — Z6835 Body mass index (BMI) 35.0-35.9, adult: Secondary | ICD-10-CM | POA: Diagnosis not present

## 2019-11-30 DIAGNOSIS — Z0001 Encounter for general adult medical examination with abnormal findings: Secondary | ICD-10-CM | POA: Diagnosis not present

## 2019-11-30 DIAGNOSIS — Z0000 Encounter for general adult medical examination without abnormal findings: Secondary | ICD-10-CM | POA: Diagnosis not present

## 2019-11-30 DIAGNOSIS — Z1389 Encounter for screening for other disorder: Secondary | ICD-10-CM | POA: Diagnosis not present

## 2019-11-30 DIAGNOSIS — Z1331 Encounter for screening for depression: Secondary | ICD-10-CM | POA: Diagnosis not present

## 2019-11-30 DIAGNOSIS — R600 Localized edema: Secondary | ICD-10-CM | POA: Diagnosis not present

## 2019-11-30 DIAGNOSIS — G894 Chronic pain syndrome: Secondary | ICD-10-CM | POA: Diagnosis not present

## 2019-12-10 DIAGNOSIS — I10 Essential (primary) hypertension: Secondary | ICD-10-CM | POA: Diagnosis not present

## 2019-12-10 DIAGNOSIS — G894 Chronic pain syndrome: Secondary | ICD-10-CM | POA: Diagnosis not present

## 2019-12-10 DIAGNOSIS — F4542 Pain disorder with related psychological factors: Secondary | ICD-10-CM | POA: Diagnosis not present

## 2019-12-21 DIAGNOSIS — M25572 Pain in left ankle and joints of left foot: Secondary | ICD-10-CM | POA: Diagnosis not present

## 2019-12-21 DIAGNOSIS — M19072 Primary osteoarthritis, left ankle and foot: Secondary | ICD-10-CM | POA: Diagnosis not present

## 2019-12-22 ENCOUNTER — Other Ambulatory Visit: Payer: Self-pay | Admitting: Orthopaedic Surgery

## 2019-12-22 DIAGNOSIS — M19072 Primary osteoarthritis, left ankle and foot: Secondary | ICD-10-CM

## 2019-12-22 DIAGNOSIS — G894 Chronic pain syndrome: Secondary | ICD-10-CM | POA: Diagnosis not present

## 2019-12-22 DIAGNOSIS — M1991 Primary osteoarthritis, unspecified site: Secondary | ICD-10-CM | POA: Diagnosis not present

## 2020-01-10 DIAGNOSIS — F4542 Pain disorder with related psychological factors: Secondary | ICD-10-CM | POA: Diagnosis not present

## 2020-01-10 DIAGNOSIS — I10 Essential (primary) hypertension: Secondary | ICD-10-CM | POA: Diagnosis not present

## 2020-01-10 DIAGNOSIS — G894 Chronic pain syndrome: Secondary | ICD-10-CM | POA: Diagnosis not present

## 2020-01-19 DIAGNOSIS — G894 Chronic pain syndrome: Secondary | ICD-10-CM | POA: Diagnosis not present

## 2020-01-19 DIAGNOSIS — M170 Bilateral primary osteoarthritis of knee: Secondary | ICD-10-CM | POA: Diagnosis not present

## 2020-02-29 ENCOUNTER — Other Ambulatory Visit: Payer: Self-pay

## 2020-02-29 ENCOUNTER — Ambulatory Visit
Admission: RE | Admit: 2020-02-29 | Discharge: 2020-02-29 | Disposition: A | Discharge status: Home / Self Care | Payer: Medicare HMO | Source: Ambulatory Visit | Attending: Orthopaedic Surgery | Admitting: Orthopaedic Surgery

## 2020-02-29 DIAGNOSIS — Z6835 Body mass index (BMI) 35.0-35.9, adult: Secondary | ICD-10-CM | POA: Diagnosis not present

## 2020-02-29 DIAGNOSIS — M19072 Primary osteoarthritis, left ankle and foot: Secondary | ICD-10-CM | POA: Diagnosis not present

## 2020-02-29 DIAGNOSIS — E6609 Other obesity due to excess calories: Secondary | ICD-10-CM | POA: Diagnosis not present

## 2020-02-29 DIAGNOSIS — R6883 Chills (without fever): Secondary | ICD-10-CM | POA: Diagnosis not present

## 2020-02-29 DIAGNOSIS — Z981 Arthrodesis status: Secondary | ICD-10-CM | POA: Diagnosis not present

## 2020-02-29 DIAGNOSIS — G894 Chronic pain syndrome: Secondary | ICD-10-CM | POA: Diagnosis not present

## 2020-03-06 ENCOUNTER — Other Ambulatory Visit: Payer: Self-pay

## 2020-03-06 ENCOUNTER — Other Ambulatory Visit: Payer: Medicare HMO

## 2020-03-06 DIAGNOSIS — Z20822 Contact with and (suspected) exposure to covid-19: Secondary | ICD-10-CM | POA: Diagnosis not present

## 2020-03-07 LAB — SARS-COV-2, NAA 2 DAY TAT

## 2020-03-07 LAB — NOVEL CORONAVIRUS, NAA: SARS-CoV-2, NAA: DETECTED — AB

## 2020-03-08 ENCOUNTER — Telehealth: Payer: Self-pay | Admitting: Infectious Diseases

## 2020-03-08 NOTE — Telephone Encounter (Signed)
Called to discuss with patient about COVID-19 symptoms and the use of one of the available treatments for those with mild to moderate Covid symptoms and at a high risk of hospitalization.  Pt appears to qualify for outpatient treatment due to co-morbid conditions and/or a member of an at-risk group in accordance with the FDA Emergency Use Authorization.   Symptom onset: 03/03/2020 Vaccinated: no  Booster? no Immunocompromised? no Qualifiers: BMI, sounds like someone with lung problems, smoker, HTN, unvaccinated   He will be set up for treatment tomorrow AM.    Janene Madeira

## 2020-03-09 ENCOUNTER — Ambulatory Visit (HOSPITAL_COMMUNITY): Payer: Medicare HMO

## 2020-03-10 DIAGNOSIS — F4542 Pain disorder with related psychological factors: Secondary | ICD-10-CM | POA: Diagnosis not present

## 2020-03-10 DIAGNOSIS — I10 Essential (primary) hypertension: Secondary | ICD-10-CM | POA: Diagnosis not present

## 2020-03-10 DIAGNOSIS — G894 Chronic pain syndrome: Secondary | ICD-10-CM | POA: Diagnosis not present

## 2020-03-15 ENCOUNTER — Ambulatory Visit: Payer: Medicare HMO

## 2020-04-13 DIAGNOSIS — N5201 Erectile dysfunction due to arterial insufficiency: Secondary | ICD-10-CM | POA: Diagnosis not present

## 2020-04-13 DIAGNOSIS — N393 Stress incontinence (female) (male): Secondary | ICD-10-CM | POA: Diagnosis not present

## 2020-04-13 DIAGNOSIS — C61 Malignant neoplasm of prostate: Secondary | ICD-10-CM | POA: Diagnosis not present

## 2020-04-24 DIAGNOSIS — G894 Chronic pain syndrome: Secondary | ICD-10-CM | POA: Diagnosis not present

## 2020-04-24 DIAGNOSIS — M179 Osteoarthritis of knee, unspecified: Secondary | ICD-10-CM | POA: Diagnosis not present

## 2020-04-24 DIAGNOSIS — M1991 Primary osteoarthritis, unspecified site: Secondary | ICD-10-CM | POA: Diagnosis not present

## 2020-05-28 DIAGNOSIS — Z1389 Encounter for screening for other disorder: Secondary | ICD-10-CM | POA: Diagnosis not present

## 2020-05-28 DIAGNOSIS — Z1331 Encounter for screening for depression: Secondary | ICD-10-CM | POA: Diagnosis not present

## 2020-05-28 DIAGNOSIS — G894 Chronic pain syndrome: Secondary | ICD-10-CM | POA: Diagnosis not present

## 2020-05-28 DIAGNOSIS — Z6833 Body mass index (BMI) 33.0-33.9, adult: Secondary | ICD-10-CM | POA: Diagnosis not present

## 2020-05-28 DIAGNOSIS — R7309 Other abnormal glucose: Secondary | ICD-10-CM | POA: Diagnosis not present

## 2020-05-28 DIAGNOSIS — R5383 Other fatigue: Secondary | ICD-10-CM | POA: Diagnosis not present

## 2020-06-27 DIAGNOSIS — Z6834 Body mass index (BMI) 34.0-34.9, adult: Secondary | ICD-10-CM | POA: Diagnosis not present

## 2020-06-27 DIAGNOSIS — M1991 Primary osteoarthritis, unspecified site: Secondary | ICD-10-CM | POA: Diagnosis not present

## 2020-06-27 DIAGNOSIS — M2550 Pain in unspecified joint: Secondary | ICD-10-CM | POA: Diagnosis not present

## 2020-06-27 DIAGNOSIS — E6609 Other obesity due to excess calories: Secondary | ICD-10-CM | POA: Diagnosis not present

## 2020-06-27 DIAGNOSIS — I10 Essential (primary) hypertension: Secondary | ICD-10-CM | POA: Diagnosis not present

## 2020-06-27 DIAGNOSIS — M064 Inflammatory polyarthropathy: Secondary | ICD-10-CM | POA: Diagnosis not present

## 2020-07-18 ENCOUNTER — Observation Stay (HOSPITAL_COMMUNITY)
Admission: EM | Admit: 2020-07-18 | Discharge: 2020-07-19 | Disposition: A | Discharge status: Home / Self Care | Payer: Medicare HMO | Attending: Emergency Medicine | Admitting: Emergency Medicine

## 2020-07-18 ENCOUNTER — Other Ambulatory Visit: Payer: Self-pay

## 2020-07-18 ENCOUNTER — Emergency Department (HOSPITAL_COMMUNITY): Payer: Medicare HMO

## 2020-07-18 ENCOUNTER — Encounter (HOSPITAL_COMMUNITY): Payer: Self-pay

## 2020-07-18 DIAGNOSIS — M7989 Other specified soft tissue disorders: Secondary | ICD-10-CM | POA: Diagnosis not present

## 2020-07-18 DIAGNOSIS — Z8546 Personal history of malignant neoplasm of prostate: Secondary | ICD-10-CM | POA: Diagnosis not present

## 2020-07-18 DIAGNOSIS — Z20822 Contact with and (suspected) exposure to covid-19: Secondary | ICD-10-CM | POA: Insufficient documentation

## 2020-07-18 DIAGNOSIS — Z96653 Presence of artificial knee joint, bilateral: Secondary | ICD-10-CM | POA: Insufficient documentation

## 2020-07-18 DIAGNOSIS — E669 Obesity, unspecified: Secondary | ICD-10-CM

## 2020-07-18 DIAGNOSIS — L03116 Cellulitis of left lower limb: Secondary | ICD-10-CM | POA: Diagnosis not present

## 2020-07-18 DIAGNOSIS — Z79899 Other long term (current) drug therapy: Secondary | ICD-10-CM | POA: Diagnosis not present

## 2020-07-18 DIAGNOSIS — Z9889 Other specified postprocedural states: Secondary | ICD-10-CM | POA: Diagnosis not present

## 2020-07-18 DIAGNOSIS — D649 Anemia, unspecified: Secondary | ICD-10-CM | POA: Diagnosis not present

## 2020-07-18 DIAGNOSIS — R2242 Localized swelling, mass and lump, left lower limb: Secondary | ICD-10-CM | POA: Diagnosis not present

## 2020-07-18 DIAGNOSIS — I10 Essential (primary) hypertension: Secondary | ICD-10-CM | POA: Insufficient documentation

## 2020-07-18 DIAGNOSIS — G894 Chronic pain syndrome: Secondary | ICD-10-CM

## 2020-07-18 DIAGNOSIS — M19072 Primary osteoarthritis, left ankle and foot: Secondary | ICD-10-CM | POA: Diagnosis not present

## 2020-07-18 DIAGNOSIS — E66811 Obesity, class 1: Secondary | ICD-10-CM | POA: Diagnosis present

## 2020-07-18 HISTORY — DX: Obesity, unspecified: E66.9

## 2020-07-18 HISTORY — DX: Obesity, class 1: E66.811

## 2020-07-18 HISTORY — DX: Essential (primary) hypertension: I10

## 2020-07-18 LAB — CBC WITH DIFFERENTIAL/PLATELET
Abs Immature Granulocytes: 0.04 10*3/uL (ref 0.00–0.07)
Basophils Absolute: 0 10*3/uL (ref 0.0–0.1)
Basophils Relative: 0 %
Eosinophils Absolute: 0.1 10*3/uL (ref 0.0–0.5)
Eosinophils Relative: 1 %
HCT: 36.8 % — ABNORMAL LOW (ref 39.0–52.0)
Hemoglobin: 11.2 g/dL — ABNORMAL LOW (ref 13.0–17.0)
Immature Granulocytes: 0 %
Lymphocytes Relative: 7 %
Lymphs Abs: 0.7 10*3/uL (ref 0.7–4.0)
MCH: 26 pg (ref 26.0–34.0)
MCHC: 30.4 g/dL (ref 30.0–36.0)
MCV: 85.6 fL (ref 80.0–100.0)
Monocytes Absolute: 1.1 10*3/uL — ABNORMAL HIGH (ref 0.1–1.0)
Monocytes Relative: 10 %
Neutro Abs: 9 10*3/uL — ABNORMAL HIGH (ref 1.7–7.7)
Neutrophils Relative %: 82 %
Platelets: 323 10*3/uL (ref 150–400)
RBC: 4.3 MIL/uL (ref 4.22–5.81)
RDW: 13.1 % (ref 11.5–15.5)
WBC: 11.1 10*3/uL — ABNORMAL HIGH (ref 4.0–10.5)
nRBC: 0 % (ref 0.0–0.2)

## 2020-07-18 LAB — COMPREHENSIVE METABOLIC PANEL
ALT: 14 U/L (ref 0–44)
AST: 17 U/L (ref 15–41)
Albumin: 3.6 g/dL (ref 3.5–5.0)
Alkaline Phosphatase: 78 U/L (ref 38–126)
Anion gap: 8 (ref 5–15)
BUN: 10 mg/dL (ref 6–20)
CO2: 28 mmol/L (ref 22–32)
Calcium: 8.3 mg/dL — ABNORMAL LOW (ref 8.9–10.3)
Chloride: 100 mmol/L (ref 98–111)
Creatinine, Ser: 0.51 mg/dL — ABNORMAL LOW (ref 0.61–1.24)
GFR, Estimated: >60 mL/min (ref >60)
Glucose, Bld: 117 mg/dL — ABNORMAL HIGH (ref 70–99)
Potassium: 3.7 mmol/L (ref 3.5–5.1)
Sodium: 136 mmol/L (ref 135–145)
Total Bilirubin: 0.8 mg/dL (ref 0.3–1.2)
Total Protein: 7.2 g/dL (ref 6.5–8.1)

## 2020-07-18 LAB — RESP PANEL BY RT-PCR (FLU A&B, COVID) ARPGX2
Influenza A by PCR: NEGATIVE
Influenza B by PCR: NEGATIVE
SARS Coronavirus 2 by RT PCR: NEGATIVE

## 2020-07-18 MED ORDER — ONDANSETRON HCL 4 MG/2ML IJ SOLN: 4.0000 mg | Freq: Four times a day (QID) | INTRAMUSCULAR | Status: DC | PRN

## 2020-07-18 MED ORDER — OXYCODONE HCL 5 MG PO TABS
5.0000 mg | ORAL_TABLET | ORAL | Status: DC | PRN
  Administered 2020-07-19: 5 mg via ORAL
  Filled 2020-07-18 (×2): qty 1

## 2020-07-18 MED ORDER — ACETAMINOPHEN 325 MG PO TABS
650.0000 mg | ORAL_TABLET | Freq: Once | ORAL | Status: AC
  Administered 2020-07-18: 650 mg via ORAL
  Filled 2020-07-18: qty 2

## 2020-07-18 MED ORDER — ONDANSETRON HCL 4 MG/2ML IJ SOLN
4.0000 mg | Freq: Once | INTRAMUSCULAR | Status: AC
  Administered 2020-07-18: 4 mg via INTRAVENOUS
  Filled 2020-07-18: qty 2

## 2020-07-18 MED ORDER — SODIUM CHLORIDE 0.9 % IV SOLN: 2.0000 g | INTRAVENOUS | Status: DC

## 2020-07-18 MED ORDER — ACETAMINOPHEN 650 MG RE SUPP: 650.0000 mg | Freq: Four times a day (QID) | RECTAL | Status: DC | PRN

## 2020-07-18 MED ORDER — HYDROMORPHONE HCL 1 MG/ML IJ SOLN
1.0000 mg | Freq: Once | INTRAMUSCULAR | Status: AC
  Administered 2020-07-18: 1 mg via INTRAVENOUS
  Filled 2020-07-18: qty 1

## 2020-07-18 MED ORDER — SODIUM CHLORIDE 0.9 % IV BOLUS
1000.0000 mL | Freq: Once | INTRAVENOUS | Status: AC
  Administered 2020-07-18: 1000 mL via INTRAVENOUS

## 2020-07-18 MED ORDER — ACETAMINOPHEN 325 MG PO TABS: 650.0000 mg | ORAL_TABLET | Freq: Four times a day (QID) | ORAL | Status: DC | PRN

## 2020-07-18 MED ORDER — ONDANSETRON HCL 4 MG PO TABS: 4.0000 mg | ORAL_TABLET | Freq: Four times a day (QID) | ORAL | Status: DC | PRN

## 2020-07-18 MED ORDER — ENOXAPARIN SODIUM 40 MG/0.4ML IJ SOSY
40.0000 mg | PREFILLED_SYRINGE | INTRAMUSCULAR | Status: DC
  Administered 2020-07-18: 40 mg via SUBCUTANEOUS
  Filled 2020-07-18: qty 0.4

## 2020-07-18 MED ORDER — KETOROLAC TROMETHAMINE 30 MG/ML IJ SOLN
30.0000 mg | Freq: Four times a day (QID) | INTRAMUSCULAR | Status: AC
  Administered 2020-07-18 – 2020-07-19 (×2): 30 mg via INTRAVENOUS
  Filled 2020-07-18 (×2): qty 1

## 2020-07-18 MED ORDER — SODIUM CHLORIDE 0.9 % IV SOLN
2.0000 g | Freq: Once | INTRAVENOUS | Status: AC
  Administered 2020-07-18: 2 g via INTRAVENOUS
  Filled 2020-07-18: qty 20

## 2020-07-18 NOTE — ED Provider Notes (Signed)
[facility] EMERGENCY DEPARTMENT Provider Note   CSN: [id number] Arrival date & time: 07/18/20  1314     History Chief Complaint  Patient presents with  . Foot Swelling    Andrew Haley is a 55 y.o. male.  The history is provided by the patient. No language interpreter was used.  Leg Pain Location:  Ankle and foot Injury: no   Ankle location:  L ankle Pain details:    Quality:  Aching   Radiates to:  L leg   Severity:  Moderate   Onset quality:  Gradual   Timing:  Constant   Progression:  Worsening Chronicity:  New Foreign body present:  No foreign bodies Relieved by:  Nothing Worsened by:  Nothing Ineffective treatments:  None tried Associated symptoms: swelling   Pt reports he has a history of ankle fracture.  Pt reports he is suppose to have surgery to repair.  Pt reports he has had increased swelling over the past 2 days.  Pt reports increased swelling and redness      Past Medical History:  Diagnosis Date  . Arthritis   . Complication of anesthesia    slow to wake up  . Hypertension   . Hypertriglyceridemia   . Numbness of fingers of both hands   . Prostate cancer ([place]) 2017  . Sleep apnea    CPAP    Patient Active Problem List   Diagnosis Date Noted  . Encounter for screening colonoscopy 07/30/2017  . Prostate cancer ([place]) 11/29/2015  . Postoperative anemia due to acute blood loss 05/25/2014  . History of total knee arthroplasty 05/23/2014  . H/O total knee replacement 02/22/2014    Past Surgical History:  Procedure Laterality Date  . COLONOSCOPY WITH PROPOFOL N/A 09/28/2017   Procedure: COLONOSCOPY WITH PROPOFOL;  Surgeon: Daneil Dolin, MD;  [facility]: [facility];  Service: Endoscopy;  Laterality: N/A;  12:00pm  . HAND SURGERY Right 2003   cysts   . JOINT REPLACEMENT    . LESION REMOVAL Left 09/23/2013   Procedure: MINOR EXCISION 3 CM SKIN LESION OF LEFT THIGH ;  Surgeon: Jamesetta So, MD;  Location: [facility] ORS;  Service: General;  Laterality:  Left;  . LYMPHADENECTOMY Bilateral 11/29/2015   Procedure: LYMPHADENECTOMY;  Surgeon: Raynelle Bring, MD;  Location: [facility] ORS;  Service: Urology;  Laterality: Bilateral;  . ROBOT ASSISTED LAPAROSCOPIC RADICAL PROSTATECTOMY N/A 11/29/2015   Procedure: XI ROBOTIC ASSISTED LAPAROSCOPIC RADICAL PROSTATECTOMY LEVEL 3;  Surgeon: Raynelle Bring, MD;  Location: [facility] ORS;  Service: Urology;  Laterality: N/A;  . TOTAL KNEE ARTHROPLASTY Left 02/22/2014   Procedure: LEFT TOTAL KNEE ARTHROPLASTY;  Surgeon: Tobi Bastos, MD;  Location: [facility];  Service: Orthopedics;  Laterality: Left;  . TOTAL KNEE ARTHROPLASTY Right 05/23/2014   Procedure: RIGHT TOTAL KNEE ARTHROPLASTY;  Surgeon: Latanya Maudlin, MD;  Location: [facility];  Service: Orthopedics;  Laterality: Right;       Family History  Problem Relation Age of Onset  . Heart disease Mother   . Throat cancer Father   . Colon cancer Neg Hx   . Colon polyps Neg Hx     Social History   Tobacco Use  . Smoking status: Never Smoker  . Smokeless tobacco: Never Used  Vaping Use  . Vaping Use: Never used  Substance Use Topics  . Alcohol use: Yes    Comment: OCCASIONAL  . Drug use: No    Home Medications Prior to Admission medications   Medication Sig Start  Date End Date Taking? Authorizing Provider  diclofenac (VOLTAREN) 75 MG EC tablet Take 150 mg by mouth daily.  07/23/17   [provider]  HYDROcodone-acetaminophen (NORCO) 10-325 MG tablet Take 1 tablet by mouth See admin instructions. Take 1 tablet every morning and may take a second dose during the day as needed for pain 07/03/17   [provider]  lisinopril-hydrochlorothiazide (PRINZIDE,ZESTORETIC) 20-12.5 MG per tablet Take 1 tablet by mouth every morning.    [provider]  methocarbamol (ROBAXIN-750) 750 MG tablet Take 1 tablet (750 mg total) by mouth 4 (four) times daily. 05/03/18   Evalee Jefferson, PA-C    Allergies    Patient has no known allergies.  Review of Systems    Review of Systems  All other systems reviewed and are negative.   Physical Exam Updated Vital Signs BP (!) 170/112 (BP Location: Right Arm)   Pulse (!) 111   Temp 98.7 F (37.1 C) (Oral)   Resp 19   Ht 6\' 3"  (1.905 m)   Wt 113.4 kg   SpO2 100%   BMI 31.25 kg/m   Physical Exam Vitals and nursing note reviewed.  Constitutional:      Appearance: He is well-developed.  HENT:     Head: Normocephalic.  Pulmonary:     Effort: Pulmonary effort is normal.  Abdominal:     General: There is no distension.  Musculoskeletal:        General: Normal range of motion.     Cervical back: Normal range of motion.  Skin:    General: Skin is warm.     Findings: Erythema present.  Neurological:     Mental Status: He is alert and oriented to person, place, and time.  Psychiatric:        Mood and Affect: Mood normal.     ED Results / Procedures / Treatments   Labs (all labs ordered are listed, but only abnormal results are displayed) Labs Reviewed  CBC WITH DIFFERENTIAL/PLATELET - Abnormal; Notable for the following components:      Result Value   WBC 11.1 (*)    Hemoglobin 11.2 (*)    HCT 36.8 (*)    Neutro Abs 9.0 (*)    Monocytes Absolute 1.1 (*)    All other components within normal limits  COMPREHENSIVE METABOLIC PANEL - Abnormal; Notable for the following components:   Glucose, Bld 117 (*)    Creatinine, Ser 0.51 (*)    Calcium 8.3 (*)    All other components within normal limits  RESP PANEL BY RT-PCR (FLU A&B, COVID) ARPGX2    EKG None  Radiology US Venous Img Lower Unilateral Left  Result Date: 07/18/2020 CLINICAL DATA:  Swelling. EXAM: LEFT LOWER EXTREMITY VENOUS DOPPLER ULTRASOUND TECHNIQUE: Gray-scale sonography with compression, as well as color and duplex ultrasound, were performed to evaluate the deep venous system(s) from the level of the common femoral vein through the popliteal and proximal calf veins. COMPARISON:  None. FINDINGS: VENOUS Normal  compressibility of the common femoral, superficial femoral, and popliteal veins, as well as the visualized calf veins. Visualized portions of profunda femoral vein and great saphenous vein unremarkable. No filling defects to suggest DVT on grayscale or color Doppler imaging. Doppler waveforms show normal direction of venous flow, normal respiratory plasticity and response to augmentation. Limited views of the contralateral common femoral vein are unremarkable. IMPRESSION: No evidence of DVT. Electronically Signed   By: Margaretha Sheffield MD   On: 07/18/2020 16:37  DG Foot Complete Left  Result Date: 07/18/2020 CLINICAL DATA:  Left foot pain. EXAM: LEFT FOOT - COMPLETE 3+ VIEW COMPARISON:  None. FINDINGS: There is no evidence of an acute fracture or dislocation. A radiopaque fixation plate and screws are seen along the left lateral malleolus. Chronic deformities are also seen involving the distal left tibia. The second through fifth left toes are flexed in position and subsequently limited in evaluation. A small chronic versus congenital deformity is seen along the lateral aspect of the fifth left metatarsal. Mild degenerative changes are seen involving the mid left foot. Marked severity diffuse soft tissue swelling is seen throughout the left foot and left ankle. IMPRESSION: 1. Chronic, degenerative and postoperative changes without an acute osseous abnormality. 2. Diffuse soft tissue swelling. Electronically Signed   By: Virgina Norfolk M.D.   On: 07/18/2020 17:53    Procedures Procedures   Medications Ordered in ED Medications  cefTRIAXone (ROCEPHIN) 2 g in sodium chloride 0.9 % 100 mL IVPB (has no administration in time range)  sodium chloride 0.9 % bolus 1,000 mL (0 mLs Intravenous Stopped 07/18/20 1657)  HYDROmorphone (DILAUDID) injection 1 mg (1 mg Intravenous Given 07/18/20 1532)  ondansetron (ZOFRAN) injection 4 mg (4 mg Intravenous Given 07/18/20 1532)  acetaminophen (TYLENOL) tablet 650 mg (650  mg Oral Given 07/18/20 1532)  sodium chloride 0.9 % bolus 1,000 mL (1,000 mLs Intravenous New Bag/Given 07/18/20 1801)    ED Course  I have reviewed the triage vital signs and the nursing notes.  Pertinent labs & imaging results that were available during my care of the patient were reviewed by me and considered in my medical decision making (see chart for details).    MDM Rules/Calculators/A&P                          MDM:  Pt has a fever.  Pt reports severe pain.  Ultrasound obtained,  No dvt,  Xray no osteo.   Pt given rocephin 2 grams IV  Final Clinical Impression(s) / ED Diagnoses Final diagnoses:  Cellulitis of left leg    Rx / DC Orders ED Discharge Orders    None       Sidney Ace 07/18/20 1916    Milton Ferguson, MD 07/19/20 1045

## 2020-07-18 NOTE — ED Notes (Signed)
Patient transported to Ultrasound 

## 2020-07-18 NOTE — ED Notes (Signed)
Entered pts. Room pts. Had removed nasal cannula and O2 monitor. This nurse placed O2 monitor back on pt. Pts. O2 was 89. This nurse placed the nasal cannula back on pt. Nasal cannula is currently at 2 L of O2. Pts. O2 is 93. Educated pt. About the importance of keeping the nasal cannula on.

## 2020-07-18 NOTE — ED Notes (Signed)
Pt. Is on 3 L of O2 nasal cannula. Pts. O2 is 95.

## 2020-07-18 NOTE — H&P (Signed)
History and Physical    Andrew Haley JQB:[id number] DOB: [birth year] DOA: 07/18/2020  PCP: Redmond School, MD   Patient coming from: Home.   I have personally briefly reviewed patient's old medical records in [place]  Chief Complaint: Left leg/foot pain  HPI: Andrew Haley is a 55 y.o. male with medical history significant of osteoarthritis, class I obesity, essential hypertension, hypercholesterolemia, numbness of fingers, prostate cancer, sleep apnea on CPAP, left ankle surgery about a year ago who is coming to the emergency department due to acute worsening of a chronic LLE edema now with fever, chills, erythema, calor and TTP for the past 2 to 3 days.  He denies headache, sore throat, rhinorrhea, productive cough, wheezing or hemoptysis.  No chest pain, palpitations, diaphoresis, PND, orthopnea or pitting edema lower extremities.  No abdominal pain, nausea, emesis, diarrhea, constipation, melena or hematochezia.  Denies dysuria, frequency or hematuria.  Denies polyuria, polydipsia, polyphagia or blurred vision.  ED Course: Initial vital signs where temperature 104 F, pulse 100, respirations 20, BP 170/95 mmHg O2 sat 94% on room air.  The patient received 2000 mL of NS bolus, ondansetron 4 mg IVP, hydromorphone 1 mg IVP and ceftriaxone 2 g IVPB.  Labwork: CBC showed a white count 11.1 with 82% neutrophils, hemoglobin 11.2 g/dL platelets 323.  CMP showed a glucose of 117, creatinine was 0.51 and calcium 8.3 mg/dL which corrects to current albumin level to 8.7 mg/dL.  Imaging: Left foot x-ray showed chronic degenerative and postop changes without any acute osseous abnormalities.  There was diffuse soft tissue swelling.  Ultrasound the right lower extremity did not show any evidence of DVT.  Please see images and full radiology report for further detail.  Review of Systems: As per HPI otherwise all other systems reviewed and are negative.  Past Medical History:  Diagnosis Date  .  Arthritis   . Class 1 obesity 07/18/2020  . Complication of anesthesia    slow to wake up  . Essential hypertension 07/18/2020  . Hypertension   . Hypertriglyceridemia   . Numbness of fingers of both hands   . Prostate cancer ([place]) 2017  . Sleep apnea    CPAP    Past Surgical History:  Procedure Laterality Date  . COLONOSCOPY WITH PROPOFOL N/A 09/28/2017   Procedure: COLONOSCOPY WITH PROPOFOL;  Surgeon: Daneil Dolin, MD;  [facility];  Service: Endoscopy;  Laterality: N/A;  12:00pm  . HAND SURGERY Right 2003   cysts   . JOINT REPLACEMENT    . LESION REMOVAL Left 09/23/2013   Procedure: MINOR EXCISION 3 CM SKIN LESION OF LEFT THIGH ;  Surgeon: Jamesetta So, MD;  Location: [facility] ORS;  Service: General;  Laterality: Left;  . LYMPHADENECTOMY Bilateral 11/29/2015   Procedure: LYMPHADENECTOMY;  Surgeon: Raynelle Bring, MD;  Location[facility];  Service: Urology;  Laterality: Bilateral;  . ROBOT ASSISTED LAPAROSCOPIC RADICAL PROSTATECTOMY N/A 11/29/2015   Procedure: XI ROBOTIC ASSISTED LAPAROSCOPIC RADICAL PROSTATECTOMY LEVEL 3;  Surgeon: Raynelle Bring, MD;  Location: [facility];  Service: Urology;  Laterality: N/A;  . TOTAL KNEE ARTHROPLASTY Left 02/22/2014   Procedure: LEFT TOTAL KNEE ARTHROPLASTY;  Surgeon: Tobi Bastos, MD;  Location: [facility];  Service: Orthopedics;  Laterality: Left;  . TOTAL KNEE ARTHROPLASTY Right 05/23/2014   Procedure: RIGHT TOTAL KNEE ARTHROPLASTY;  Surgeon: Latanya Maudlin, MD;  Location: [facility];  Service: Orthopedics;  Laterality: Right;    Social History  reports that he has never smoked.  He has never used smokeless tobacco. He reports current alcohol use. He reports that he does not use drugs.  No Known Allergies  Family History  Problem Relation Age of Onset  . Heart disease Mother   . Throat cancer Father   . Colon cancer Neg Hx   . Colon polyps Neg Hx    Prior to Admission medications   Medication Sig Start Date End Date Taking? Authorizing  Provider  diclofenac (VOLTAREN) 75 MG EC tablet Take 150 mg by mouth daily.  07/23/17   [provider]  HYDROcodone-acetaminophen (NORCO) 10-325 MG tablet Take 1 tablet by mouth See admin instructions. Take 1 tablet every morning and may take a second dose during the day as needed for pain 07/03/17   [provider]  lisinopril-hydrochlorothiazide (PRINZIDE,ZESTORETIC) 20-12.5 MG per tablet Take 1 tablet by mouth every morning.    [provider]  methocarbamol (ROBAXIN-750) 750 MG tablet Take 1 tablet (750 mg total) by mouth 4 (four) times daily. 05/03/18   Evalee Jefferson, PA-C    Physical Exam: Vitals:   07/18/20 1339 07/18/20 1341 07/18/20 1657 07/18/20 1905  BP:  (!) 170/95 (!) 170/112   Pulse:  100 (!) 111   Resp:  20 19   Temp:  (!) 101.4 F (38.6 C) 98.7 F (37.1 C) 98.6 F (37 C)  TempSrc:  Oral Oral Oral  SpO2:  94% 100%   Weight: 113.4 kg     Height: 6\' 3"  (1.905 m)       Constitutional: NAD, calm, comfortable Eyes: PERRL, lids and conjunctivae normal.  Sclera mildly injected. ENMT: Mucous membranes are moist. Posterior pharynx clear of any exudate or lesions. Neck: normal, supple, no masses, no thyromegaly Respiratory: clear to auscultation bilaterally, no wheezing, no crackles. Normal respiratory effort. No accessory muscle use.  Cardiovascular: Regular rate and rhythm, no murmurs / rubs / gallops. No extremity edema. 2+ pedal pulses. No carotid bruits.  Abdomen: Obese, no distention.  No tenderness, no masses palpated. No hepatosplenomegaly. Bowel sounds positive.  Musculoskeletal: Left lower extremity edema with erythema, calor, TTP and decreased left foot/ankle ROM.  No clubbing / cyanosis.  No contractures. Normal muscle tone.  Skin: Positive erythema and edema on left lower extremity. Neurologic: CN 2-12 grossly intact. Sensation intact, DTR normal. Strength 5/5 in all 4.  Psychiatric: Normal judgment and insight. Alert and oriented x 3. Normal  mood.   Labs on Admission: I have personally reviewed following labs and imaging studies  CBC: Recent Labs  Lab 07/18/20 1617  WBC 11.1*  NEUTROABS 9.0*  HGB 11.2*  HCT 36.8*  MCV 85.6  PLT 626    Basic Metabolic Panel: Recent Labs  Lab 07/18/20 1617  NA 136  K 3.7  CL 100  CO2 28  GLUCOSE 117*  BUN 10  CREATININE 0.51*  CALCIUM 8.3*    GFR: Estimated Creatinine Clearance: 140.1 mL/min (A) (by C-G formula based on SCr of 0.51 mg/dL (L)).  Liver Function Tests: Recent Labs  Lab 07/18/20 1617  AST 17  ALT 14  ALKPHOS 78  BILITOT 0.8  PROT 7.2  ALBUMIN 3.6   Radiological Exams on Admission: US Venous Img Lower Unilateral Left  Result Date: 07/18/2020 CLINICAL DATA:  Swelling. EXAM: LEFT LOWER EXTREMITY VENOUS DOPPLER ULTRASOUND TECHNIQUE: Gray-scale sonography with compression, as well as color and duplex ultrasound, were performed to evaluate the deep venous system(s) from the level of the common femoral vein through the popliteal and proximal calf veins. COMPARISON:  None. FINDINGS: VENOUS Normal compressibility of the common femoral, superficial femoral, and popliteal veins, as well as the visualized calf veins. Visualized portions of profunda femoral vein and great saphenous vein unremarkable. No filling defects to suggest DVT on grayscale or color Doppler imaging. Doppler waveforms show normal direction of venous flow, normal respiratory plasticity and response to augmentation. Limited views of the contralateral common femoral vein are unremarkable. IMPRESSION: No evidence of DVT. Electronically Signed   By: Margaretha Sheffield MD   On: 07/18/2020 16:37   DG Foot Complete Left  Result Date: 07/18/2020 CLINICAL DATA:  Left foot pain. EXAM: LEFT FOOT - COMPLETE 3+ VIEW COMPARISON:  None. FINDINGS: There is no evidence of an acute fracture or dislocation. A radiopaque fixation plate and screws are seen along the left lateral malleolus. Chronic deformities are also seen  involving the distal left tibia. The second through fifth left toes are flexed in position and subsequently limited in evaluation. A small chronic versus congenital deformity is seen along the lateral aspect of the fifth left metatarsal. Mild degenerative changes are seen involving the mid left foot. Marked severity diffuse soft tissue swelling is seen throughout the left foot and left ankle. IMPRESSION: 1. Chronic, degenerative and postoperative changes without an acute osseous abnormality. 2. Diffuse soft tissue swelling. Electronically Signed   By: Virgina Norfolk M.D.   On: 07/18/2020 17:53    EKG: Independently reviewed.   Assessment/Plan Principal Problem:   Cellulitis of left lower extremity Observation/MedSurg. Oxycodone 5 mg p.o. every 4 hours as needed. Toradol 30 mg IVP every 6 hours x 2 doses. Resume diclofenac ER 75 mg p.o. twice daily. Continue ceftriaxone 2 g IVPB daily. Follow blood cultures and sensitivity.  Active Problems:   Normocytic anemia Monitor H&H. Check stool occult blood. Check anemia panel. Follow-up with PCP after discharge.    Hypocalcemia Repeat calcium and albumin level in AM. Further work-up depending on these results.    Essential hypertension Stopped medications after weight loss. However, resume losartan 50 mg p.o. daily while in the hospital. Follow-up BP, renal function and electrolytes. Follow-up with PCP as an outpatient.    Class 1 obesity Continue lifestyle modifications and weight loss. Follow-up with primary care provider.    Sleep apnea Resume CPAP at bedtime.    DVT prophylaxis: Lovenox SQ. Code Status:   Full code. Family Communication: Disposition Plan:   Patient is from:  Home.  Anticipated DC to:  Home.  Anticipated DC date:  07/20/2020.  Anticipated DC barriers: Clinical status.  Consults called:   Admission status:  Observation/MedSurg.  Severity of Illness:  High severity due to presenting with significant  erythema, tenderness, calor and TTP with decreased ROM of left ankle.  The patient will need to remain for IV antibiotic therapy for 2 to 3 days.  Reubin Milan MD [facility]  How to contact the [facility] Attending or Consulting provider [place] or covering provider during after hours [place], for this patient?   1. Check the care team in [facility] and look for a) attending/consulting [facility] provider listed and b) the [facility] team listed 2. Log into [email or web address] and use [place]'s universal password to access. If you do not have the password, please contact the hospital operator. 3. Locate the [facility] provider you are looking for under [facility] and page to a number that you can be directly reached. 4. If you still have difficulty reaching the provider, please page the [employer] (Director on Call) for  the Hospitalists listed on amion for assistance.  07/18/2020, 7:58 PM   This document was prepared using Dragon voice recognition software may contain some unintended transcription errors.

## 2020-07-18 NOTE — ED Triage Notes (Signed)
Pt to er, pt states that 1 year ago he hurt his L ankle, states that he has been getting cortisone shots, states that he was going to get a fusion of his ankle, but he got covid and couldn't get it.  States that over the past two days he has had increased pain.

## 2020-07-18 NOTE — ED Notes (Signed)
Assessed pt. Pts. O2 was 66 on RA. Placed pt. On nasal cannula at 4 L. Pts. O2 went to 92. Notified pt. That pts. O2 was 66 on RA and that they were on a nasal cannula. Went back to assess pt. Pt. Was 24 on 4 L. Placed pt. On a non rebreather. Pt. Is currently 100 on RA. Notified charge nurse and PA.

## 2020-07-18 NOTE — ED Notes (Signed)
ED TO INPATIENT HANDOFF REPORT  ED Nurse Name and Phone #:   S Name/Age/Gender Andrew Haley 55 y.o. male Room/Bed: APFT24/APFT24  Code Status   Code Status: Full Code  Home/SNF/Other Home Patient oriented to: self, place, time and situation Is this baseline? Yes   Triage Complete: Triage complete  Chief Complaint Cellulitis of left lower extremity [W46.659]  Triage Note Pt to er, pt states that 1 year ago he hurt his L ankle, states that he has been getting cortisone shots, states that he was going to get a fusion of his ankle, but he got covid and couldn't get it.  States that over the past two days he has had increased pain.      Allergies No Known Allergies  Level of Care/Admitting Diagnosis ED Disposition    ED Disposition Condition Comment   Admit  Hospital Area: [facility] [[id number]]  Level of Care: Med-Surg [16]  Covid Evaluation: Confirmed COVID Negative  Diagnosis: Cellulitis of left lower extremity [779390]  Admitting Physician: Reubin Milan [[id number]]  Attending Physician: Reubin Milan [[id number]]       B Medical/Surgery History Past Medical History:  Diagnosis Date  . Arthritis   . Class 1 obesity 07/18/2020  . Complication of anesthesia    slow to wake up  . Essential hypertension 07/18/2020  . Hypertension   . Hypertriglyceridemia   . Numbness of fingers of both hands   . Prostate cancer ([place]) 2017  . Sleep apnea    CPAP   Past Surgical History:  Procedure Laterality Date  . COLONOSCOPY WITH PROPOFOL N/A 09/28/2017   Procedure: COLONOSCOPY WITH PROPOFOL;  Surgeon: Daneil Dolin, MD;  Location[facility];  Service: Endoscopy;  Laterality: N/A;  12:00pm  . HAND SURGERY Right 2003   cysts   . JOINT REPLACEMENT    . LESION REMOVAL Left 09/23/2013   Procedure: MINOR EXCISION 3 CM SKIN LESION OF LEFT THIGH ;  Surgeon: Jamesetta So, MD;  Location: [facility] ORS;  Service: General;  Laterality: Left;  . LYMPHADENECTOMY Bilateral  11/29/2015   Procedure: LYMPHADENECTOMY;  Surgeon: Raynelle Bring, MD;  Location: [facility];  Service: Urology;  Laterality: Bilateral;  . ROBOT ASSISTED LAPAROSCOPIC RADICAL PROSTATECTOMY N/A 11/29/2015   Procedure: XI ROBOTIC ASSISTED LAPAROSCOPIC RADICAL PROSTATECTOMY LEVEL 3;  Surgeon: Raynelle Bring, MD;  Location: [facility];  Service: Urology;  Laterality: N/A;  . TOTAL KNEE ARTHROPLASTY Left 02/22/2014   Procedure: LEFT TOTAL KNEE ARTHROPLASTY;  Surgeon: Tobi Bastos, MD;  Location: [facility];  Service: Orthopedics;  Laterality: Left;  . TOTAL KNEE ARTHROPLASTY Right 05/23/2014   Procedure: RIGHT TOTAL KNEE ARTHROPLASTY;  Surgeon: Latanya Maudlin, MD;  Location: [facility];  Service: Orthopedics;  Laterality: Right;     A IV Location/Drains/Wounds Patient Lines/Drains/Airways Status    Active Line/Drains/Airways    Name Placement date Placement time Site Days   Peripheral IV 11/29/15 Left Hand 11/29/15  1051  Hand  1693   Peripheral IV 07/18/20 20 G Left;Posterior Forearm 07/18/20  1535  Forearm  less than 1   Closed System Drain 1 LLQ Bulb (JP) 19 Fr. 11/29/15  1343  LLQ  1693   Urethral Catheter Coude 18 Fr. 11/29/15  1338  Coude  1693   Incision - 6 Ports Abdomen 1: Left;Lower;Lateral 2: Left;Lateral;Upper 3: Superior;Umbilicus 4: Right;Lower;Lateral 5: Right;Medial;Upper 6: Right;Lateral;Upper;Left 11/29/15  1142  -- 1693          Intake/Output Last 24 hours  Intake/Output Summary (Last 24 hours) at 07/18/2020 2004 Last data filed at 07/18/2020 2002 Gross per 24 hour  Intake 2100 ml  Output --  Net 2100 ml    Labs/Imaging Results for orders placed or performed during the hospital encounter of 07/18/20 (from the past 48 hour(s))  CBC with Differential/Platelet     Status: Abnormal   Collection Time: 07/18/20  4:17 PM  Result Value Ref Range   WBC 11.1 (H) 4.0 - 10.5 K/uL   RBC 4.30 4.22 - 5.81 MIL/uL   Hemoglobin 11.2 (L) 13.0 - 17.0 g/dL   HCT 36.8 (L) 39.0 - 52.0 %   MCV 85.6  80.0 - 100.0 fL   MCH 26.0 26.0 - 34.0 pg   MCHC 30.4 30.0 - 36.0 g/dL   RDW 13.1 11.5 - 15.5 %   Platelets 323 150 - 400 K/uL   nRBC 0.0 0.0 - 0.2 %   Neutrophils Relative % 82 %   Neutro Abs 9.0 (H) 1.7 - 7.7 K/uL   Lymphocytes Relative 7 %   Lymphs Abs 0.7 0.7 - 4.0 K/uL   Monocytes Relative 10 %   Monocytes Absolute 1.1 (H) 0.1 - 1.0 K/uL   Eosinophils Relative 1 %   Eosinophils Absolute 0.1 0.0 - 0.5 K/uL   Basophils Relative 0 %   Basophils Absolute 0.0 0.0 - 0.1 K/uL   Immature Granulocytes 0 %   Abs Immature Granulocytes 0.04 0.00 - 0.07 K/uL    Comment: Performed at [facility], [address], [place], [place] [address]  Comprehensive metabolic panel     Status: Abnormal   Collection Time: 07/18/20  4:17 PM  Result Value Ref Range   Sodium 136 135 - 145 mmol/L   Potassium 3.7 3.5 - 5.1 mmol/L   Chloride 100 98 - 111 mmol/L   CO2 28 22 - 32 mmol/L   Glucose, Bld 117 (H) 70 - 99 mg/dL    Comment: Glucose reference range applies only to samples taken after fasting for at least 8 hours.   BUN 10 6 - 20 mg/dL   Creatinine, Ser 0.51 (L) 0.61 - 1.24 mg/dL   Calcium 8.3 (L) 8.9 - 10.3 mg/dL   Total Protein 7.2 6.5 - 8.1 g/dL   Albumin 3.6 3.5 - 5.0 g/dL   AST 17 15 - 41 U/L   ALT 14 0 - 44 U/L   Alkaline Phosphatase 78 38 - 126 U/L   Total Bilirubin 0.8 0.3 - 1.2 mg/dL   GFR, Estimated >60 >60 mL/min    Comment: (NOTE) Calculated using the CKD-EPI Creatinine Equation (2021)    Anion gap 8 5 - 15    Comment: Performed at [facility], [address], [place], [place] [address]  Resp Panel by RT-PCR (Flu A&B, Covid) Nasopharyngeal Swab     Status: None   Collection Time: 07/18/20  5:56 PM   Specimen: Nasopharyngeal Swab; Nasopharyngeal(NP) swabs in vial transport medium  Result Value Ref Range   SARS Coronavirus 2 by RT PCR NEGATIVE NEGATIVE    Comment: (NOTE) SARS-CoV-2 target nucleic acids are NOT DETECTED.  The SARS-CoV-2 RNA is generally detectable in upper  respiratory specimens during the acute phase of infection. The lowest concentration of SARS-CoV-2 viral copies this assay can detect is 138 copies/mL. A negative result does not preclude SARS-Cov-2 infection and should not be used as the sole basis for treatment or other patient management decisions. A negative result may occur with  improper specimen collection/handling, submission  of specimen other than nasopharyngeal swab, presence of viral mutation(s) within the areas targeted by this assay, and inadequate number of viral copies(<138 copies/mL). A negative result must be combined with clinical observations, patient history, and epidemiological information. The expected result is Negative.  Fact Sheet for Patients:  EntrepreneurPulse.com.au  Fact Sheet for Healthcare Providers:  IncredibleEmployment.be  This test is no t yet approved or cleared by the [place] FDA and  has been authorized for detection and/or diagnosis of SARS-CoV-2 by FDA under an Emergency Use Authorization (EUA). This EUA will remain  in effect (meaning this test can be used) for the duration of the COVID-19 declaration under Section 564(b)(1) of the Act, 21 U.S.C.section 360bbb-3(b)(1), unless the authorization is terminated  or revoked sooner.       Influenza A by PCR NEGATIVE NEGATIVE   Influenza B by PCR NEGATIVE NEGATIVE    Comment: (NOTE) The Xpert Xpress SARS-CoV-2/FLU/RSV plus assay is intended as an aid in the diagnosis of influenza from Nasopharyngeal swab specimens and should not be used as a sole basis for treatment. Nasal washings and aspirates are unacceptable for Xpert Xpress SARS-CoV-2/FLU/RSV testing.  Fact Sheet for Patients: EntrepreneurPulse.com.au  Fact Sheet for Healthcare Providers: IncredibleEmployment.be  This test is not yet approved or cleared by the [place] FDA and has been authorized for  detection and/or diagnosis of SARS-CoV-2 by FDA under an Emergency Use Authorization (EUA). This EUA will remain in effect (meaning this test can be used) for the duration of the COVID-19 declaration under Section 564(b)(1) of the Act, 21 U.S.C. section 360bbb-3(b)(1), unless the authorization is terminated or revoked.  Performed at [facility], [address], [place], [place] [address]    US Venous Img Lower Unilateral Left  Result Date: 07/18/2020 CLINICAL DATA:  Swelling. EXAM: LEFT LOWER EXTREMITY VENOUS DOPPLER ULTRASOUND TECHNIQUE: Gray-scale sonography with compression, as well as color and duplex ultrasound, were performed to evaluate the deep venous system(s) from the level of the common femoral vein through the popliteal and proximal calf veins. COMPARISON:  None. FINDINGS: VENOUS Normal compressibility of the common femoral, superficial femoral, and popliteal veins, as well as the visualized calf veins. Visualized portions of profunda femoral vein and great saphenous vein unremarkable. No filling defects to suggest DVT on grayscale or color Doppler imaging. Doppler waveforms show normal direction of venous flow, normal respiratory plasticity and response to augmentation. Limited views of the contralateral common femoral vein are unremarkable. IMPRESSION: No evidence of DVT. Electronically Signed   By: Margaretha Sheffield MD   On: 07/18/2020 16:37   DG Foot Complete Left  Result Date: 07/18/2020 CLINICAL DATA:  Left foot pain. EXAM: LEFT FOOT - COMPLETE 3+ VIEW COMPARISON:  None. FINDINGS: There is no evidence of an acute fracture or dislocation. A radiopaque fixation plate and screws are seen along the left lateral malleolus. Chronic deformities are also seen involving the distal left tibia. The second through fifth left toes are flexed in position and subsequently limited in evaluation. A small chronic versus congenital deformity is seen along the lateral aspect of the fifth left metatarsal.  Mild degenerative changes are seen involving the mid left foot. Marked severity diffuse soft tissue swelling is seen throughout the left foot and left ankle. IMPRESSION: 1. Chronic, degenerative and postoperative changes without an acute osseous abnormality. 2. Diffuse soft tissue swelling. Electronically Signed   By: Virgina Norfolk M.D.   On: 07/18/2020 17:53    Pending Labs Unresulted Labs (From admission, onward)  Start     Ordered   07/25/20 0500  Creatinine, serum  (enoxaparin (LOVENOX)    CrCl >/= 30 ml/min)  Weekly,   R     Comments: while on enoxaparin therapy    07/18/20 1939   07/19/20 0500  HIV Antibody (routine testing w rflx)  (HIV Antibody (Routine testing w reflex) panel)  Tomorrow morning,   R        07/18/20 1939   07/19/20 0500  CBC WITH DIFFERENTIAL  Tomorrow morning,   R        07/18/20 1939          Vitals/Pain Today's Vitals   07/18/20 1409 07/18/20 1608 07/18/20 1657 07/18/20 1905  BP:   (!) 170/112   Pulse:   (!) 111   Resp:   19   Temp:   98.7 F (37.1 C) 98.6 F (37 C)  TempSrc:   Oral Oral  SpO2:   100%   Weight:      Height:      PainSc: 10-Worst pain ever 10-Worst pain ever      Isolation Precautions Airborne and Contact precautions  Medications Medications  enoxaparin (LOVENOX) injection 40 mg (has no administration in time range)  acetaminophen (TYLENOL) tablet 650 mg (has no administration in time range)    Or  acetaminophen (TYLENOL) suppository 650 mg (has no administration in time range)  ondansetron (ZOFRAN) tablet 4 mg (has no administration in time range)    Or  ondansetron (ZOFRAN) injection 4 mg (has no administration in time range)  oxyCODONE (Oxy IR/ROXICODONE) immediate release tablet 5 mg (has no administration in time range)  cefTRIAXone (ROCEPHIN) 2 g in sodium chloride 0.9 % 100 mL IVPB (has no administration in time range)  sodium chloride 0.9 % bolus 1,000 mL (0 mLs Intravenous Stopped 07/18/20 1657)   HYDROmorphone (DILAUDID) injection 1 mg (1 mg Intravenous Given 07/18/20 1532)  ondansetron (ZOFRAN) injection 4 mg (4 mg Intravenous Given 07/18/20 1532)  acetaminophen (TYLENOL) tablet 650 mg (650 mg Oral Given 07/18/20 1532)  sodium chloride 0.9 % bolus 1,000 mL (0 mLs Intravenous Stopped 07/18/20 1901)  cefTRIAXone (ROCEPHIN) 2 g in sodium chloride 0.9 % 100 mL IVPB (0 g Intravenous Stopped 07/18/20 2002)    Mobility walks Low fall risk   Focused Assessments    R Recommendations: See Admitting Provider Note  Report given to:   Additional Notes:

## 2020-07-19 DIAGNOSIS — G894 Chronic pain syndrome: Secondary | ICD-10-CM | POA: Diagnosis not present

## 2020-07-19 DIAGNOSIS — I10 Essential (primary) hypertension: Secondary | ICD-10-CM

## 2020-07-19 DIAGNOSIS — E669 Obesity, unspecified: Secondary | ICD-10-CM | POA: Diagnosis not present

## 2020-07-19 DIAGNOSIS — L03116 Cellulitis of left lower limb: Secondary | ICD-10-CM | POA: Diagnosis not present

## 2020-07-19 LAB — CBC WITH DIFFERENTIAL/PLATELET
Abs Immature Granulocytes: 0.02 10*3/uL (ref 0.00–0.07)
Basophils Absolute: 0 10*3/uL (ref 0.0–0.1)
Basophils Relative: 0 %
Eosinophils Absolute: 0.1 10*3/uL (ref 0.0–0.5)
Eosinophils Relative: 1 %
HCT: 32.7 % — ABNORMAL LOW (ref 39.0–52.0)
Hemoglobin: 10.1 g/dL — ABNORMAL LOW (ref 13.0–17.0)
Immature Granulocytes: 0 %
Lymphocytes Relative: 5 %
Lymphs Abs: 0.5 10*3/uL — ABNORMAL LOW (ref 0.7–4.0)
MCH: 26.6 pg (ref 26.0–34.0)
MCHC: 30.9 g/dL (ref 30.0–36.0)
MCV: 86.1 fL (ref 80.0–100.0)
Monocytes Absolute: 1 10*3/uL (ref 0.1–1.0)
Monocytes Relative: 10 %
Neutro Abs: 8.4 10*3/uL — ABNORMAL HIGH (ref 1.7–7.7)
Neutrophils Relative %: 84 %
Platelets: 245 10*3/uL (ref 150–400)
RBC: 3.8 MIL/uL — ABNORMAL LOW (ref 4.22–5.81)
RDW: 13 % (ref 11.5–15.5)
WBC: 9.9 10*3/uL (ref 4.0–10.5)
nRBC: 0 % (ref 0.0–0.2)

## 2020-07-19 LAB — IRON AND TIBC
Iron: 15 ug/dL — ABNORMAL LOW (ref 45–182)
Saturation Ratios: 5 % — ABNORMAL LOW (ref 17.9–39.5)
TIBC: 299 ug/dL (ref 250–450)
UIBC: 284 ug/dL

## 2020-07-19 LAB — RETICULOCYTES
Immature Retic Fract: 14.3 % (ref 2.3–15.9)
RBC.: 3.88 MIL/uL — ABNORMAL LOW (ref 4.22–5.81)
Retic Count, Absolute: 71 10*3/uL (ref 19.0–186.0)
Retic Ct Pct: 1.8 % (ref 0.4–3.1)

## 2020-07-19 LAB — VITAMIN B12: Vitamin B-12: 56 pg/mL — ABNORMAL LOW (ref 180–914)

## 2020-07-19 LAB — FOLATE: Folate: 7.5 ng/mL (ref >5.9)

## 2020-07-19 LAB — FERRITIN: Ferritin: 47 ng/mL (ref 24–336)

## 2020-07-19 LAB — HIV ANTIBODY (ROUTINE TESTING W REFLEX): HIV Screen 4th Generation wRfx: NONREACTIVE

## 2020-07-19 MED ORDER — DICLOFENAC SODIUM 75 MG PO TBEC
75.0000 mg | DELAYED_RELEASE_TABLET | Freq: Every day | ORAL | Status: DC
  Administered 2020-07-19: 75 mg via ORAL
  Filled 2020-07-19: qty 1

## 2020-07-19 MED ORDER — METHOCARBAMOL 750 MG PO TABS: 750.0000 mg | ORAL_TABLET | Freq: Three times a day (TID) | ORAL | 0 refills | Status: DC | PRN

## 2020-07-19 MED ORDER — DICLOFENAC SODIUM 75 MG PO TBEC: 150.0000 mg | DELAYED_RELEASE_TABLET | Freq: Every day | ORAL | Status: DC

## 2020-07-19 MED ORDER — DOXYCYCLINE HYCLATE 50 MG PO CAPS: 50.0000 mg | ORAL_CAPSULE | Freq: Two times a day (BID) | ORAL | 0 refills | Status: DC

## 2020-07-19 MED ORDER — LOSARTAN POTASSIUM-HCTZ 50-12.5 MG PO TABS: 1.0000 | ORAL_TABLET | Freq: Every day | ORAL | 11 refills | Status: DC

## 2020-07-19 MED ORDER — LOSARTAN POTASSIUM 50 MG PO TABS
50.0000 mg | ORAL_TABLET | Freq: Every day | ORAL | Status: DC
  Administered 2020-07-19: 50 mg via ORAL
  Filled 2020-07-19: qty 1

## 2020-07-19 MED ORDER — ALLOPURINOL 300 MG PO TABS: 300.0000 mg | ORAL_TABLET | Freq: Every day | ORAL | 2 refills | Status: DC

## 2020-07-19 MED ORDER — IBUPROFEN 800 MG PO TABS: 800.0000 mg | ORAL_TABLET | Freq: Three times a day (TID) | ORAL | 0 refills | Status: DC | PRN

## 2020-07-19 NOTE — Discharge Summary (Signed)
Physician Discharge Summary  Andrew Haley GGY:[id number] DOB: 04/02/1965 DOA: 07/18/2020  PCP: Redmond School, MD  Admit date: 07/18/2020 Discharge date: 07/19/2020  Time spent: 35 minutes  Recommendations for Outpatient Follow-up:  Reassess blood pressure and adjust antihypertensive as needed. Repeat basic metabolic panel to evaluate lites and renal function. Reassess left lower extremity and assure complete resolution of cellulitic process.   Discharge Diagnoses:  Principal Problem:   Cellulitis of left lower extremity Active Problems:   Normocytic anemia   Hypocalcemia   Essential hypertension   Class 1 obesity   Chronic pain syndrome   Discharge Condition: Stable and improved.  Discharged home with instructions to follow along PCP in 10 days.  CODE STATUS: Full code PT  Diet recommendation: Heart healthy diet.  Filed Weights   07/18/20 1339  Weight: 113.4 kg    History of present illness:  As per H&P written by Dr. Olevia Bowens on 07/18/20 Andrew Haley is a 55 y.o. male with medical history significant of osteoarthritis, class I obesity, essential hypertension, hypercholesterolemia, numbness of fingers, prostate cancer, sleep apnea on CPAP, left ankle surgery about a year ago who is coming to the emergency department due to acute worsening of a chronic LLE edema now with fever, chills, erythema, calor and TTP for the past 2 to 3 days.  He denies headache, sore throat, rhinorrhea, productive cough, wheezing or hemoptysis.  No chest pain, palpitations, diaphoresis, PND, orthopnea or pitting edema lower extremities.  No abdominal pain, nausea, emesis, diarrhea, constipation, melena or hematochezia.  Denies dysuria, frequency or hematuria.  Denies polyuria, polydipsia, polyphagia or blurred vision.   ED Course: Initial vital signs where temperature 104 F, pulse 100, respirations 20, BP 170/95 mmHg O2 sat 94% on room air.  The patient received 2000 mL of NS bolus, ondansetron 4 mg IVP,  hydromorphone 1 mg IVP and ceftriaxone 2 g IVPB.  Hospital Course:  Left lower extremity cellulitis -No supportive deep and without open wounds or drainage. -Significant improvement in the erythematous changes and pain at time of discharge; patient afebrile, no nausea, no vomiting). -Discharge on doxycycline twice a day for 10 more days.-Advised to keep leg elevated and apply cold compresses.  History of gout -Will treat with allopurinol -Outpatient follow-up uric acid recommended. -Continue treatment with NSAIDs  Essential hypertension -Recent use of HCTZ and losartan -Heart healthy diet recommended -Advised electrolyte hydration.  Class I obesity/obstructive sleep apnea -Body mass index is 31.25 kg/m. -Low calorie diet, portion control and increase physical activity discussed with patient -Continue the use of CPAP nightly.  Procedures: See below for x-ray reports.  Consultations: None  Discharge Exam: Vitals:   07/18/20 2327 07/19/20 0539  BP:  (!) 145/91  Pulse: 92 86  Resp:  18  Temp:  97.7 F (36.5 C)  SpO2: 99% 99%    General: Afebrile, no chest pain, no nausea, no vomiting, no shortness of breath.  Reports improvement in pain and edema on left lower extremity. Still swollen. Cardiovascular: S1 and S2, no rubs, no gallops, no JVD. Respiratory: Good air movement bilaterally; no wheezing or crackles. Abdomen: Soft, nontender nondistended multiple times Extremities: No cyanosis or clubbing; left upper extremity in 1-2+ edema bilaterally. Improvement in erythematous changes and pain appreciated. No drainage or open wounds.   Discharge Instructions   Discharge Instructions     Diet - low sodium heart healthy   Complete by: As directed    Discharge instructions   Complete by: As directed  Take medications are prescribed Maintain adequate hydration Arrange follow up with PCP in 10 days Keep legs elevated as much as possible and apply cold compresses as  discussed.   Increase activity slowly   Complete by: As directed       Allergies as of 07/19/2020   No Known Allergies      Medication List     STOP taking these medications    diclofenac 75 MG EC tablet Commonly known as: VOLTAREN   HYDROcodone-acetaminophen 10-325 MG tablet Commonly known as: NORCO   lisinopril-hydrochlorothiazide 20-12.5 MG tablet Commonly known as: ZESTORETIC   losartan 50 MG tablet Commonly known as: COZAAR       TAKE these medications    allopurinol 300 MG tablet Commonly known as: Zyloprim Take 1 tablet (300 mg total) by mouth daily.   doxycycline 50 MG capsule Commonly known as: VIBRAMYCIN Take 1 capsule (50 mg total) by mouth 2 (two) times daily for 20 doses.   ibuprofen 800 MG tablet Commonly known as: ADVIL Take 1 tablet (800 mg total) by mouth every 8 (eight) hours as needed.   losartan-hydrochlorothiazide 50-12.5 MG tablet Commonly known as: Hyzaar Take 1 tablet by mouth daily.   methocarbamol 750 MG tablet Commonly known as: Robaxin-750 Take 1 tablet (750 mg total) by mouth every 8 (eight) hours as needed for muscle spasms. What changed:  when to take this reasons to take this   oxyCODONE-acetaminophen 10-325 MG tablet Commonly known as: PERCOCET Take 1 tablet by mouth every 4 (four) hours as needed.       No Known Allergies  Follow-up Information     Redmond School, MD. Schedule an appointment as soon as possible for a visit.   Specialty: Internal Medicine Contact information: [address] [place] [address] [phone]                 The results of significant diagnostics from this hospitalization (including imaging, microbiology, ancillary and laboratory) are listed below for reference.    Significant Diagnostic Studies: US Venous Img Lower Unilateral Left  Result Date: 07/18/2020 CLINICAL DATA:  Swelling. EXAM: LEFT LOWER EXTREMITY VENOUS DOPPLER ULTRASOUND TECHNIQUE: Gray-scale  sonography with compression, as well as color and duplex ultrasound, were performed to evaluate the deep venous system(s) from the level of the common femoral vein through the popliteal and proximal calf veins. COMPARISON:  None. FINDINGS: VENOUS Normal compressibility of the common femoral, superficial femoral, and popliteal veins, as well as the visualized calf veins. Visualized portions of profunda femoral vein and great saphenous vein unremarkable. No filling defects to suggest DVT on grayscale or color Doppler imaging. Doppler waveforms show normal direction of venous flow, normal respiratory plasticity and response to augmentation. Limited views of the contralateral common femoral vein are unremarkable. IMPRESSION: No evidence of DVT. Electronically Signed   By: Margaretha Sheffield MD   On: 07/18/2020 16:37   DG Foot Complete Left  Result Date: 07/18/2020 CLINICAL DATA:  Left foot pain. EXAM: LEFT FOOT - COMPLETE 3+ VIEW COMPARISON:  None. FINDINGS: There is no evidence of an acute fracture or dislocation. A radiopaque fixation plate and screws are seen along the left lateral malleolus. Chronic deformities are also seen involving the distal left tibia. The second through fifth left toes are flexed in position and subsequently limited in evaluation. A small chronic versus congenital deformity is seen along the lateral aspect of the fifth left metatarsal. Mild degenerative changes are seen involving the mid left foot. Marked severity  diffuse soft tissue swelling is seen throughout the left foot and left ankle. IMPRESSION: 1. Chronic, degenerative and postoperative changes without an acute osseous abnormality. 2. Diffuse soft tissue swelling. Electronically Signed   By: Virgina Norfolk M.D.   On: 07/18/2020 17:53    Microbiology: Recent Results (from the past 240 hour(s))  Resp Panel by RT-PCR (Flu A&B, Covid) Nasopharyngeal Swab     Status: None   Collection Time: 07/18/20  5:56 PM   Specimen:  Nasopharyngeal Swab; Nasopharyngeal(NP) swabs in vial transport medium  Result Value Ref Range Status   SARS Coronavirus 2 by RT PCR NEGATIVE NEGATIVE Final    Comment: (NOTE) SARS-CoV-2 target nucleic acids are NOT DETECTED.  The SARS-CoV-2 RNA is generally detectable in upper respiratory specimens during the acute phase of infection. The lowest concentration of SARS-CoV-2 viral copies this assay can detect is 138 copies/mL. A negative result does not preclude SARS-Cov-2 infection and should not be used as the sole basis for treatment or other patient management decisions. A negative result may occur with  improper specimen collection/handling, submission of specimen other than nasopharyngeal swab, presence of viral mutation(s) within the areas targeted by this assay, and inadequate number of viral copies(<138 copies/mL). A negative result must be combined with clinical observations, patient history, and epidemiological information. The expected result is Negative.  Fact Sheet for Patients:  EntrepreneurPulse.com.au  Fact Sheet for Healthcare Providers:  IncredibleEmployment.be  This test is no t yet approved or cleared by the [place] FDA and  has been authorized for detection and/or diagnosis of SARS-CoV-2 by FDA under an Emergency Use Authorization (EUA). This EUA will remain  in effect (meaning this test can be used) for the duration of the COVID-19 declaration under Section 564(b)(1) of the Act, 21 U.S.C.section 360bbb-3(b)(1), unless the authorization is terminated  or revoked sooner.       Influenza A by PCR NEGATIVE NEGATIVE Final   Influenza B by PCR NEGATIVE NEGATIVE Final    Comment: (NOTE) The Xpert Xpress SARS-CoV-2/FLU/RSV plus assay is intended as an aid in the diagnosis of influenza from Nasopharyngeal swab specimens and should not be used as a sole basis for treatment. Nasal washings and aspirates are unacceptable for  Xpert Xpress SARS-CoV-2/FLU/RSV testing.  Fact Sheet for Patients: EntrepreneurPulse.com.au  Fact Sheet for Healthcare Providers: IncredibleEmployment.be  This test is not yet approved or cleared by the [place] FDA and has been authorized for detection and/or diagnosis of SARS-CoV-2 by FDA under an Emergency Use Authorization (EUA). This EUA will remain in effect (meaning this test can be used) for the duration of the COVID-19 declaration under Section 564(b)(1) of the Act, 21 U.S.C. section 360bbb-3(b)(1), unless the authorization is terminated or revoked.  Performed at [facility], [address] [place], [place] [address]      Labs: Basic Metabolic Panel: Recent Labs  Lab 07/18/20 1617  NA 136  K 3.7  CL 100  CO2 28  GLUCOSE 117*  BUN 10  CREATININE 0.51*  CALCIUM 8.3*   Liver Function Tests: Recent Labs  Lab 07/18/20 1617  AST 17  ALT 14  ALKPHOS 78  BILITOT 0.8  PROT 7.2  ALBUMIN 3.6   CBC: Recent Labs  Lab 07/18/20 1617 07/19/20 0531  WBC 11.1* 9.9  NEUTROABS 9.0* 8.4*  HGB 11.2* 10.1*  HCT 36.8* 32.7*  MCV 85.6 86.1  PLT 323 245    Signed:  Barton Dubois MD.  [facility] 07/19/2020, 1:47 PM

## 2020-07-19 NOTE — Progress Notes (Signed)
Discharge instructions given. Pt verbalized understanding. Waiting on ride now.

## 2020-07-23 ENCOUNTER — Encounter (HOSPITAL_COMMUNITY): Payer: Self-pay | Admitting: Emergency Medicine

## 2020-07-23 ENCOUNTER — Other Ambulatory Visit: Payer: Self-pay

## 2020-07-23 ENCOUNTER — Inpatient Hospital Stay (HOSPITAL_COMMUNITY)
Admission: EM | Admit: 2020-07-23 | Discharge: 2020-07-30 | DRG: 872 | Disposition: A | Discharge status: Home Health | Payer: Medicare HMO | Attending: Internal Medicine | Admitting: Internal Medicine

## 2020-07-23 ENCOUNTER — Emergency Department (HOSPITAL_COMMUNITY): Payer: Medicare HMO

## 2020-07-23 DIAGNOSIS — C61 Malignant neoplasm of prostate: Secondary | ICD-10-CM | POA: Diagnosis not present

## 2020-07-23 DIAGNOSIS — M19172 Post-traumatic osteoarthritis, left ankle and foot: Secondary | ICD-10-CM | POA: Diagnosis present

## 2020-07-23 DIAGNOSIS — M86172 Other acute osteomyelitis, left ankle and foot: Secondary | ICD-10-CM | POA: Diagnosis present

## 2020-07-23 DIAGNOSIS — M25462 Effusion, left knee: Secondary | ICD-10-CM | POA: Diagnosis not present

## 2020-07-23 DIAGNOSIS — E538 Deficiency of other specified B group vitamins: Secondary | ICD-10-CM | POA: Diagnosis not present

## 2020-07-23 DIAGNOSIS — E876 Hypokalemia: Secondary | ICD-10-CM | POA: Diagnosis present

## 2020-07-23 DIAGNOSIS — Z20822 Contact with and (suspected) exposure to covid-19: Secondary | ICD-10-CM | POA: Diagnosis not present

## 2020-07-23 DIAGNOSIS — I444 Left anterior fascicular block: Secondary | ICD-10-CM | POA: Diagnosis present

## 2020-07-23 DIAGNOSIS — I10 Essential (primary) hypertension: Secondary | ICD-10-CM | POA: Diagnosis not present

## 2020-07-23 DIAGNOSIS — A419 Sepsis, unspecified organism: Principal | ICD-10-CM | POA: Diagnosis present

## 2020-07-23 DIAGNOSIS — Z8249 Family history of ischemic heart disease and other diseases of the circulatory system: Secondary | ICD-10-CM | POA: Diagnosis not present

## 2020-07-23 DIAGNOSIS — Z96653 Presence of artificial knee joint, bilateral: Secondary | ICD-10-CM | POA: Diagnosis present

## 2020-07-23 DIAGNOSIS — Z8546 Personal history of malignant neoplasm of prostate: Secondary | ICD-10-CM

## 2020-07-23 DIAGNOSIS — G4733 Obstructive sleep apnea (adult) (pediatric): Secondary | ICD-10-CM | POA: Diagnosis present

## 2020-07-23 DIAGNOSIS — M00872 Arthritis due to other bacteria, left ankle and foot: Secondary | ICD-10-CM | POA: Diagnosis not present

## 2020-07-23 DIAGNOSIS — Z79899 Other long term (current) drug therapy: Secondary | ICD-10-CM

## 2020-07-23 DIAGNOSIS — Z9079 Acquired absence of other genital organ(s): Secondary | ICD-10-CM | POA: Diagnosis not present

## 2020-07-23 DIAGNOSIS — D638 Anemia in other chronic diseases classified elsewhere: Secondary | ICD-10-CM | POA: Diagnosis not present

## 2020-07-23 DIAGNOSIS — E8809 Other disorders of plasma-protein metabolism, not elsewhere classified: Secondary | ICD-10-CM | POA: Diagnosis not present

## 2020-07-23 DIAGNOSIS — L03116 Cellulitis of left lower limb: Secondary | ICD-10-CM | POA: Diagnosis not present

## 2020-07-23 DIAGNOSIS — E669 Obesity, unspecified: Secondary | ICD-10-CM | POA: Diagnosis not present

## 2020-07-23 DIAGNOSIS — M7989 Other specified soft tissue disorders: Secondary | ICD-10-CM | POA: Diagnosis not present

## 2020-07-23 DIAGNOSIS — M009 Pyogenic arthritis, unspecified: Secondary | ICD-10-CM | POA: Diagnosis not present

## 2020-07-23 DIAGNOSIS — M545 Low back pain, unspecified: Secondary | ICD-10-CM | POA: Diagnosis present

## 2020-07-23 DIAGNOSIS — Z6831 Body mass index (BMI) 31.0-31.9, adult: Secondary | ICD-10-CM | POA: Diagnosis not present

## 2020-07-23 DIAGNOSIS — Z808 Family history of malignant neoplasm of other organs or systems: Secondary | ICD-10-CM

## 2020-07-23 DIAGNOSIS — M109 Gout, unspecified: Secondary | ICD-10-CM | POA: Diagnosis present

## 2020-07-23 DIAGNOSIS — G8929 Other chronic pain: Secondary | ICD-10-CM | POA: Diagnosis present

## 2020-07-23 DIAGNOSIS — M19072 Primary osteoarthritis, left ankle and foot: Secondary | ICD-10-CM | POA: Diagnosis not present

## 2020-07-23 DIAGNOSIS — D72829 Elevated white blood cell count, unspecified: Secondary | ICD-10-CM | POA: Diagnosis not present

## 2020-07-23 DIAGNOSIS — L0390 Cellulitis, unspecified: Secondary | ICD-10-CM

## 2020-07-23 LAB — COMPREHENSIVE METABOLIC PANEL
ALT: 15 U/L (ref 0–44)
AST: 16 U/L (ref 15–41)
Albumin: 2.7 g/dL — ABNORMAL LOW (ref 3.5–5.0)
Alkaline Phosphatase: 81 U/L (ref 38–126)
Anion gap: 10 (ref 5–15)
BUN: 15 mg/dL (ref 6–20)
CO2: 33 mmol/L — ABNORMAL HIGH (ref 22–32)
Calcium: 9 mg/dL (ref 8.9–10.3)
Chloride: 89 mmol/L — ABNORMAL LOW (ref 98–111)
Creatinine, Ser: 0.82 mg/dL (ref 0.61–1.24)
GFR, Estimated: >60 mL/min (ref >60)
Glucose, Bld: 105 mg/dL — ABNORMAL HIGH (ref 70–99)
Potassium: 2.9 mmol/L — ABNORMAL LOW (ref 3.5–5.1)
Sodium: 132 mmol/L — ABNORMAL LOW (ref 135–145)
Total Bilirubin: 0.5 mg/dL (ref 0.3–1.2)
Total Protein: 8.4 g/dL — ABNORMAL HIGH (ref 6.5–8.1)

## 2020-07-23 LAB — CBC WITH DIFFERENTIAL/PLATELET
Abs Immature Granulocytes: 0.05 10*3/uL (ref 0.00–0.07)
Basophils Absolute: 0.1 10*3/uL (ref 0.0–0.1)
Basophils Relative: 0 %
Eosinophils Absolute: 0.2 10*3/uL (ref 0.0–0.5)
Eosinophils Relative: 2 %
HCT: 33.8 % — ABNORMAL LOW (ref 39.0–52.0)
Hemoglobin: 10.5 g/dL — ABNORMAL LOW (ref 13.0–17.0)
Immature Granulocytes: 0 %
Lymphocytes Relative: 6 %
Lymphs Abs: 0.7 10*3/uL (ref 0.7–4.0)
MCH: 25.6 pg — ABNORMAL LOW (ref 26.0–34.0)
MCHC: 31.1 g/dL (ref 30.0–36.0)
MCV: 82.4 fL (ref 80.0–100.0)
Monocytes Absolute: 0.9 10*3/uL (ref 0.1–1.0)
Monocytes Relative: 8 %
Neutro Abs: 10.3 10*3/uL — ABNORMAL HIGH (ref 1.7–7.7)
Neutrophils Relative %: 84 %
Platelets: 428 10*3/uL — ABNORMAL HIGH (ref 150–400)
RBC: 4.1 MIL/uL — ABNORMAL LOW (ref 4.22–5.81)
RDW: 12.9 % (ref 11.5–15.5)
WBC: 12.2 10*3/uL — ABNORMAL HIGH (ref 4.0–10.5)
nRBC: 0 % (ref 0.0–0.2)

## 2020-07-23 LAB — PROTIME-INR
INR: 1.1 (ref 0.8–1.2)
Prothrombin Time: 14.1 seconds (ref 11.4–15.2)

## 2020-07-23 LAB — URINALYSIS, ROUTINE W REFLEX MICROSCOPIC
Bilirubin Urine: NEGATIVE
Glucose, UA: NEGATIVE mg/dL
Hgb urine dipstick: NEGATIVE
Ketones, ur: NEGATIVE mg/dL
Leukocytes,Ua: NEGATIVE
Nitrite: NEGATIVE
Protein, ur: NEGATIVE mg/dL
Specific Gravity, Urine: 1.013 (ref 1.005–1.030)
pH: 5 (ref 5.0–8.0)

## 2020-07-23 LAB — RESP PANEL BY RT-PCR (FLU A&B, COVID) ARPGX2
Influenza A by PCR: NEGATIVE
Influenza B by PCR: NEGATIVE
SARS Coronavirus 2 by RT PCR: NEGATIVE

## 2020-07-23 LAB — LACTIC ACID, PLASMA
Lactic Acid, Venous: 0.9 mmol/L (ref 0.5–1.9)
Lactic Acid, Venous: 1.1 mmol/L (ref 0.5–1.9)

## 2020-07-23 MED ORDER — HYDROMORPHONE HCL 1 MG/ML IJ SOLN
0.5000 mg | INTRAMUSCULAR | Status: DC | PRN
  Administered 2020-07-24 (×2): 0.5 mg via INTRAVENOUS
  Filled 2020-07-23 (×2): qty 1

## 2020-07-23 MED ORDER — VANCOMYCIN HCL 2000 MG/400ML IV SOLN
2000.0000 mg | Freq: Once | INTRAVENOUS | Status: AC
  Administered 2020-07-24: 2000 mg via INTRAVENOUS
  Filled 2020-07-23 (×2): qty 400

## 2020-07-23 MED ORDER — OXYCODONE-ACETAMINOPHEN 5-325 MG PO TABS
2.0000 | ORAL_TABLET | Freq: Once | ORAL | Status: AC
  Administered 2020-07-23: 2 via ORAL
  Filled 2020-07-23: qty 2

## 2020-07-23 MED ORDER — LACTATED RINGERS IV SOLN: INTRAVENOUS | Status: AC

## 2020-07-23 MED ORDER — POTASSIUM CHLORIDE CRYS ER 20 MEQ PO TBCR
40.0000 meq | EXTENDED_RELEASE_TABLET | Freq: Once | ORAL | Status: AC
  Administered 2020-07-24: 40 meq via ORAL
  Filled 2020-07-23: qty 2

## 2020-07-23 MED ORDER — LACTATED RINGERS IV BOLUS (SEPSIS)
1000.0000 mL | Freq: Once | INTRAVENOUS | Status: AC
  Administered 2020-07-23: 1000 mL via INTRAVENOUS

## 2020-07-23 MED ORDER — PIPERACILLIN-TAZOBACTAM 3.375 G IVPB 30 MIN
3.3750 g | Freq: Once | INTRAVENOUS | Status: AC
  Administered 2020-07-23: 3.375 g via INTRAVENOUS
  Filled 2020-07-23: qty 50

## 2020-07-23 NOTE — Progress Notes (Signed)
Sepsis tracking by eLINK 

## 2020-07-23 NOTE — Consult Note (Signed)
Brief orthopedic consult note:  Patient with history of operative treatment of left ankle fracture several years ago by Dr. Doran Durand and known history of severe left ankle arthropathy consistent with posttraumatic arthritis versus crystalline arthropathy versus inflammatory arthropathy who was planning to have ankle arthrodesis.  He presented to the emergency department after worsening left ankle pain.  He was recently admitted to [facility] for presumed cellulitis.  He was discharged on doxycycline.  He did not have much improvement in his symptoms with this treatment.  Patient does have a history of gout and known severe arthritis of the ankle and subtalar joints.  Patient had worsening pain and erythema and CT scan demonstrated ankle joint effusion with bony erosions of the tibia, talus and dorsal calcaneus.  Orthopedics was consulted.  Patient with CT findings concerning for septic arthropathy with surrounding bony erosions and possible osteomyelitis versus periarticular erosions consistent with gout.  ER to attempt ankle aspiration.  If consistent with infection patient would likely require below the knee amputation due to significant bony erosions present versus washout and prolonged antibiotics.  If aspiration is negative for infection we will plan for gout treatment.  We will have this discussion with the patient once aspiration results return.  Recommend admission to hospitalist team due to medical complexity.  Please keep patient n.p.o. past midnight for possible surgery tomorrow if indicated based on aspiration results.

## 2020-07-23 NOTE — ED Provider Notes (Signed)
[facility] EMERGENCY DEPARTMENT Provider Note   CSN: [id number] Arrival date & time: 07/23/20  1528     History Chief Complaint  Patient presents with   Skin Problem   Leg Swelling    Andrew Haley is a 55 y.o. male.  HPI  This patient is an ill-appearing 55 year old male with a history of hypertension and a history of gout, he also has a history of cellulitis of his left lower extremity, history of a total knee replacement in 2016, he has had prostate cancer this is a patient that was admitted to the hospital approximately 5 days ago, he was diagnosed with cellulitis of the left leg, he was discharged on doxycycline however the patient states that his pain has continued to get worse.  He was also treated with allopurinol for gout and continued treatment with NSAIDs.  He is now having fevers, chills and the redness is progressing up his leg almost to his knee.  Symptoms are persistent and become severe.  Past Medical History:  Diagnosis Date   Arthritis    Class 1 obesity 03/13/2246   Complication of anesthesia    slow to wake up   Essential hypertension 07/18/2020   Hypertension    Hypertriglyceridemia    Numbness of fingers of both hands    Prostate cancer ([place]) 2017   Sleep apnea    CPAP    Patient Active Problem List   Diagnosis Date Noted   Septic arthritis ([place]) 07/23/2020   Chronic pain syndrome    Cellulitis of left lower extremity 07/18/2020   Normocytic anemia 07/18/2020   Hypocalcemia 07/18/2020   Essential hypertension 07/18/2020   Class 1 obesity 07/18/2020   Encounter for screening colonoscopy 07/30/2017   Prostate cancer ([place]) 11/29/2015   Postoperative anemia due to acute blood loss 05/25/2014   History of total knee arthroplasty 05/23/2014   H/O total knee replacement 02/22/2014    Past Surgical History:  Procedure Laterality Date   COLONOSCOPY WITH PROPOFOL N/A 09/28/2017   Procedure: COLONOSCOPY WITH PROPOFOL;  Surgeon: Daneil Dolin, MD;  Location: [facility]ITE;  Service: Endoscopy;  Laterality: N/A;  12:00pm   HAND SURGERY Right 2003   cysts    JOINT REPLACEMENT     LESION REMOVAL Left 09/23/2013   Procedure: MINOR EXCISION 3 CM SKIN LESION OF LEFT THIGH ;  Surgeon: Jamesetta So, MD;  Location: [facility] ORS;  Service: General;  Laterality: Left;   LYMPHADENECTOMY Bilateral 11/29/2015   Procedure: LYMPHADENECTOMY;  Surgeon: Raynelle Bring, MD;  Location: [facility] ORS;  Service: Urology;  Laterality: Bilateral;   ROBOT ASSISTED LAPAROSCOPIC RADICAL PROSTATECTOMY N/A 11/29/2015   Procedure: XI ROBOTIC ASSISTED LAPAROSCOPIC RADICAL PROSTATECTOMY LEVEL 3;  Surgeon: Raynelle Bring, MD;  Location: [facility] ORS;  Service: Urology;  Laterality: N/A;   TOTAL KNEE ARTHROPLASTY Left 02/22/2014   Procedure: LEFT TOTAL KNEE ARTHROPLASTY;  Surgeon: Tobi Bastos, MD;  Location: [facility] ORS;  Service: Orthopedics;  Laterality: Left;   TOTAL KNEE ARTHROPLASTY Right 05/23/2014   Procedure: RIGHT TOTAL KNEE ARTHROPLASTY;  Surgeon: Latanya Maudlin, MD;  Location: [facility] ORS;  Service: Orthopedics;  Laterality: Right;       Family History  Problem Relation Age of Onset   Heart disease Mother    Throat cancer Father    Colon cancer Neg Hx    Colon polyps Neg Hx     Social History   Tobacco Use   Smoking status: Never   Smokeless tobacco: Never  Vaping Use   Vaping Use: Never used  Substance Use Topics   Alcohol use: Yes    Comment: OCCASIONAL   Drug use: No    Home Medications Prior to Admission medications   Medication Sig Start Date End Date Taking? Authorizing Provider  allopurinol (ZYLOPRIM) 300 MG tablet Take 1 tablet (300 mg total) by mouth daily. 07/19/20 07/19/21  Barton Dubois, MD  doxycycline (VIBRAMYCIN) 50 MG capsule Take 1 capsule (50 mg total) by mouth 2 (two) times daily for 20 doses. 07/19/20 07/29/20  Barton Dubois, MD  ibuprofen (ADVIL) 800 MG tablet Take 1 tablet (800 mg total) by mouth every 8 (eight) hours as needed. 07/19/20    Barton Dubois, MD  losartan-hydrochlorothiazide (HYZAAR) 50-12.5 MG tablet Take 1 tablet by mouth daily. 07/19/20 07/19/21  Barton Dubois, MD  methocarbamol (ROBAXIN-750) 750 MG tablet Take 1 tablet (750 mg total) by mouth every 8 (eight) hours as needed for muscle spasms. 07/19/20   Barton Dubois, MD  oxyCODONE-acetaminophen (PERCOCET) 10-325 MG tablet Take 1 tablet by mouth every 4 (four) hours as needed. 06/27/20   [provider]    Allergies    Patient has no known allergies.  Review of Systems   Review of Systems  All other systems reviewed and are negative.  Physical Exam Updated Vital Signs BP (!) 143/92 (BP Location: Right Arm)   Pulse 99   Temp 99.1 F (37.3 C)   Resp 20   Ht 1.905 m (6\' 3" )   Wt 115.7 kg   SpO2 95%   BMI 31.87 kg/m   Physical Exam Vitals and nursing note reviewed.  Constitutional:      General: He is not in acute distress.    Appearance: He is well-developed.  HENT:     Head: Normocephalic and atraumatic.     Mouth/Throat:     Pharynx: No oropharyngeal exudate.  Eyes:     General: No scleral icterus.       Right eye: No discharge.        Left eye: No discharge.     Conjunctiva/sclera: Conjunctivae normal.     Pupils: Pupils are equal, round, and reactive to light.  Neck:     Thyroid: No thyromegaly.     Vascular: No JVD.  Cardiovascular:     Rate and Rhythm: Regular rhythm. Tachycardia present.     Heart sounds: Normal heart sounds. No murmur heard.   No friction rub. No gallop.  Pulmonary:     Effort: Pulmonary effort is normal. No respiratory distress.     Breath sounds: Normal breath sounds. No wheezing or rales.  Abdominal:     General: Bowel sounds are normal. There is no distension.     Palpations: Abdomen is soft. There is no mass.     Tenderness: There is no abdominal tenderness.  Musculoskeletal:        General: No tenderness. Normal range of motion.     Cervical back: Normal range of motion and neck supple.      Right lower leg: No edema.     Left lower leg: Edema present.  Lymphadenopathy:     Cervical: No cervical adenopathy.  Skin:    General: Skin is warm and dry.     Findings: Erythema and rash present.  Neurological:     Mental Status: He is alert.     Coordination: Coordination normal.  Psychiatric:        Behavior: Behavior normal.    ED Results /  Procedures / Treatments   Labs (all labs ordered are listed, but only abnormal results are displayed) Labs Reviewed  COMPREHENSIVE METABOLIC PANEL - Abnormal; Notable for the following components:      Result Value   Sodium 132 (*)    Potassium 2.9 (*)    Chloride 89 (*)    CO2 33 (*)    Glucose, Bld 105 (*)    Total Protein 8.4 (*)    Albumin 2.7 (*)    All other components within normal limits  CBC WITH DIFFERENTIAL/PLATELET - Abnormal; Notable for the following components:   WBC 12.2 (*)    RBC 4.10 (*)    Hemoglobin 10.5 (*)    HCT 33.8 (*)    MCH 25.6 (*)    Platelets 428 (*)    Neutro Abs 10.3 (*)    All other components within normal limits  CULTURE, BLOOD (ROUTINE X 2)  CULTURE, BLOOD (ROUTINE X 2)  URINE CULTURE  RESP PANEL BY RT-PCR (FLU A&B, COVID) ARPGX2  BODY FLUID CULTURE W GRAM STAIN  LACTIC ACID, PLASMA  PROTIME-INR  URINALYSIS, ROUTINE W REFLEX MICROSCOPIC  LACTIC ACID, PLASMA  APTT  GLUCOSE, BODY FLUID OTHER            PROTEIN, BODY FLUID (OTHER)  SYNOVIAL CELL COUNT + DIFF, W/ CRYSTALS    EKG EKG Interpretation  Date/Time:  Monday July 23 2020 18:16:41 EDT Ventricular Rate:  95 PR Interval:  138 QRS Duration: 110 QT Interval:  360 QTC Calculation: 452 R Axis:   -50 Text Interpretation: Normal sinus rhythm Left anterior fascicular block Moderate voltage criteria for LVH, may be normal variant ( R in aVL , Cornell product ) Abnormal ECG no acute ischemia Confirmed by Lorre Munroe ([id number]) on 07/23/2020 6:44:29 PM  Radiology DG Chest 1 View  Result Date: 07/23/2020 CLINICAL DATA:  Swelling and  redness of the foot EXAM: CHEST  1 VIEW COMPARISON:  11/28/2015 FINDINGS: The heart size and mediastinal contours are within normal limits. Both lungs are clear. The visualized skeletal structures are unremarkable. IMPRESSION: No active disease. Electronically Signed   By: Donavan Foil M.D.   On: 07/23/2020 19:52   CT EXTREMITY LOWER LEFT WO CONTRAST  Result Date: 07/23/2020 CLINICAL DATA:  Left leg redness and swelling, leukocytosis EXAM: CT OF THE LOWER LEFT EXTREMITY WITHOUT CONTRAST TECHNIQUE: Multidetector CT imaging of the lower left extremity was performed according to the standard protocol. Field of view is from the left hip through the left foot. COMPARISON:  X-ray 07/18/2020, CT 02/29/2020 FINDINGS: Bones/Joint/Cartilage No acute fracture the left femur. Hip joint is intact with mild arthropathy. No appreciable hip joint effusion. Status post left total knee arthroplasty. Extensive metallic streak artifact related to patient's hardware degrades evaluation of the adjacent structures. Within this limitation. There is no evidence of periprosthetic fracture or hardware loosening. Small knee joint effusion with synovial thickening. Severe degenerative changes of the tibiotalar joint with widening of the joint space and progressive erosions along both sides of the joint as well as throughout the medial and lateral malleoli. Additionally, there are erosive changes associated with the subtalar joint and along the posterior aspect of the calcaneus. Suspected erosions along the dorsal aspect of the navicular bone. No tibiotalar joint dislocation. Prior fibular ORIF hardware. Degenerative changes of the forefoot, most pronounced at the first MTP joint where there is hallux valgus deformity. Large complex tibiotalar joint effusion containing debris. Ligaments Suboptimally assessed by CT. Muscles and Tendons Suspect large peroneal  tenosynovial fluid collection (series 11, image 113). Fatty infiltration of the calf  musculature. Soft tissues Circumferential subcutaneous edema throughout the lower leg. Marked diffuse soft tissue swelling and edema of the ankle and dorsal foot. No soft tissue gas. IMPRESSION: 1. Findings suggestive of septic arthritis of the left ankle with large complex tibiotalar joint effusion containing debris. New erosions centered at the subtalar and talonavicular joints also suspicious for septic arthritis. Orthopedic surgery consultation recommended. 2. Suspect large peroneal tenosynovial fluid collection which could be septic or reactive. 3. Circumferential subcutaneous edema throughout the lower leg, ankle, and dorsal foot, which may reflect cellulitis. No soft tissue gas. 4. Status post left total knee arthroplasty without evidence of periprosthetic fracture or hardware loosening. Electronically Signed   By: Davina Poke D.O.   On: 07/23/2020 19:57    Procedures .Critical Care  Date/Time: 07/23/2020 10:45 PM Performed by: Noemi Chapel, MD Authorized by: Noemi Chapel, MD   Critical care provider statement:    Critical care time (minutes):  35   Critical care time was exclusive of:  Separately billable procedures and treating other patients and teaching time   Critical care was necessary to treat or prevent imminent or life-threatening deterioration of the following conditions:  Sepsis   Critical care was time spent personally by me on the following activities:  Blood draw for specimens, development of treatment plan with patient or surrogate, discussions with consultants, evaluation of patient's response to treatment, examination of patient, obtaining history from patient or surrogate, ordering and performing treatments and interventions, ordering and review of laboratory studies, ordering and review of radiographic studies, pulse oximetry, re-evaluation of patient's condition and review of old charts   Medications Ordered in ED Medications  lactated ringers infusion (has no  administration in time range)  lactated ringers bolus 1,000 mL (1,000 mLs Intravenous New Bag/Given 07/23/20 2250)  vancomycin (VANCOREADY) IVPB 2000 mg/400 mL (has no administration in time range)  piperacillin-tazobactam (ZOSYN) IVPB 3.375 g (has no administration in time range)  oxyCODONE-acetaminophen (PERCOCET/ROXICET) 5-325 MG per tablet 2 tablet (2 tablets Oral Given 07/23/20 1823)    ED Course  I have reviewed the triage vital signs and the nursing notes.  Pertinent labs & imaging results that were available during my care of the patient were reviewed by me and considered in my medical decision making (see chart for details).    MDM Rules/Calculators/A&P                          Unfortunately this patient appears to have developed a septic joint, his left ankle is not very well-appearing on CT scan.  His laboratory work-up shows a leukocytosis and he does have a borderline tachycardia consistent with general sepsis.  We will add blood cultures discussed with orthopedics, antibiotics after discussion with orthopedics.  The patient is agreeable to the plan, he will need to be admitted to the hospital and consultation with orthopedic surgery.  I discussed the care with Dr. Lucia Gaskins of the orthopedic surgery service who request medical admission, he is concerned that there may be more serious involvement of the bones of the leg and that this may need further surgical involvement but wants to try antibiotics tonight.  Will page hospitalist for admission  This patient is critically ill with sepsis and septic arthritis, blood pressure is normal  Discussaed with medicine for admit  Final Clinical Impression(s) / ED Diagnoses Final diagnoses:  Septic arthritis of left ankle, due  to unspecified organism [facility])  Cellulitis, unspecified cellulitis site    Rx / DC Orders ED Discharge Orders     None        Noemi Chapel, MD 07/23/20 2322

## 2020-07-23 NOTE — ED Triage Notes (Signed)
States history of left ankle fracture over a year ago. States has increased swelling, pain and swelling. Seen at [facility] 07/19/20 diagnosed Cellulitis and Gout. Taking prescribed antibiotic but not medication for gout. 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

## 2020-07-23 NOTE — ED Provider Notes (Signed)
Emergency Medicine Provider Triage Evaluation Note  Andrew Haley , a 55 y.o. male  was evaluated in triage.  Pt complains of LLE edema, erythema, warmth to the touch and severe pain.  He was seen [facility] on 6/9 diagnosed with cellulitis, admitted overnight and discharged on doxycycline.  He states he has been taking his antibiotic as prescribed however his foot pain, swelling, redness, and warmth is worsening.  He now has fevers, and chills but denies any shortness of breath or chest pain.  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  Patient in acute distress secondary to pain.  Negative DVT study of the left leg on 07/19/2020  Review of Systems  Positive: Left foot and ankle and distal lower leg redness, swelling, pain, and warmth to the touch.  Blanching of the third and fourth toes, with decreased sensation in all 5 digits of the left foot.  Normal cap refill.  Fever, chills, cough Negative: Chest pain, shortness of breath, palpitations  Physical Exam  BP (!) 169/102 (BP Location: Left Arm)   Pulse 89   Temp 99.1 F (37.3 C)   Resp 18   Ht 6\' 3"  (1.905 m)   Wt 115.7 kg   SpO2 96%   BMI 31.87 kg/m  Gen:   Awake, no distress   Resp:  Normal effort  MSK:   Moves extremities without difficulty  Other:  Severe edema, redness, and tenderness palpation of the left foot, ankle, and distal lower leg concerning for severe infection  Medical Decision Making  Medically screening exam initiated at 5:58 PM.  Appropriate orders placed.  Andrew Haley was informed that the remainder of the evaluation will be completed by another provider, this initial triage assessment does not replace that evaluation, and the importance of remaining in the ED until their evaluation is complete.  Patient's physical exam concerning for significant infection of the foot and ankle, less consistent with DVT given how distal the redness, swelling, and  pain is.  Additionally patient has had negative DVT study the past week and requesting her to proceed with repeat ultrasound due to pain in the lower extremity.  We will proceed with sepsis work-up and CT the lower leg and foot.  This chart was dictated using voice recognition software, Dragon. Despite the best efforts of this provider to proofread and correct errors, errors may still occur which can change documentation meaning.    Andrew Haley 07/23/20 1815    Andrew Natal, MD 07/23/20 2045

## 2020-07-24 ENCOUNTER — Encounter (HOSPITAL_COMMUNITY): Payer: Self-pay | Admitting: Internal Medicine

## 2020-07-24 DIAGNOSIS — M009 Pyogenic arthritis, unspecified: Secondary | ICD-10-CM

## 2020-07-24 LAB — SYNOVIAL CELL COUNT + DIFF, W/ CRYSTALS
Crystals, Fluid: NONE SEEN
Eosinophils-Synovial: 0 % (ref 0–1)
Lymphocytes-Synovial Fld: 1 % (ref 0–20)
Monocyte-Macrophage-Synovial Fluid: 0 % — ABNORMAL LOW (ref 50–90)
Neutrophil, Synovial: 99 % — ABNORMAL HIGH (ref 0–25)
WBC, Synovial: 35000 /mm3 — ABNORMAL HIGH (ref 0–200)

## 2020-07-24 LAB — CBC
HCT: 30.4 % — ABNORMAL LOW (ref 39.0–52.0)
Hemoglobin: 9.4 g/dL — ABNORMAL LOW (ref 13.0–17.0)
MCH: 25.8 pg — ABNORMAL LOW (ref 26.0–34.0)
MCHC: 30.9 g/dL (ref 30.0–36.0)
MCV: 83.3 fL (ref 80.0–100.0)
Platelets: 336 10*3/uL (ref 150–400)
RBC: 3.65 MIL/uL — ABNORMAL LOW (ref 4.22–5.81)
RDW: 13.1 % (ref 11.5–15.5)
WBC: 10.9 10*3/uL — ABNORMAL HIGH (ref 4.0–10.5)
nRBC: 0 % (ref 0.0–0.2)

## 2020-07-24 LAB — BASIC METABOLIC PANEL
Anion gap: 11 (ref 5–15)
BUN: 13 mg/dL (ref 6–20)
CO2: 30 mmol/L (ref 22–32)
Calcium: 8.7 mg/dL — ABNORMAL LOW (ref 8.9–10.3)
Chloride: 93 mmol/L — ABNORMAL LOW (ref 98–111)
Creatinine, Ser: 0.76 mg/dL (ref 0.61–1.24)
GFR, Estimated: >60 mL/min (ref >60)
Glucose, Bld: 127 mg/dL — ABNORMAL HIGH (ref 70–99)
Potassium: 3.1 mmol/L — ABNORMAL LOW (ref 3.5–5.1)
Sodium: 134 mmol/L — ABNORMAL LOW (ref 135–145)

## 2020-07-24 LAB — CBG MONITORING, ED
Glucose-Capillary: 114 mg/dL — ABNORMAL HIGH (ref 70–99)
Glucose-Capillary: 115 mg/dL — ABNORMAL HIGH (ref 70–99)

## 2020-07-24 LAB — LACTIC ACID, PLASMA
Lactic Acid, Venous: 0.8 mmol/L (ref 0.5–1.9)
Lactic Acid, Venous: 1.3 mmol/L (ref 0.5–1.9)

## 2020-07-24 LAB — GLUCOSE, CAPILLARY
Glucose-Capillary: 139 mg/dL — ABNORMAL HIGH (ref 70–99)
Glucose-Capillary: 143 mg/dL — ABNORMAL HIGH (ref 70–99)

## 2020-07-24 MED ORDER — PIPERACILLIN-TAZOBACTAM 3.375 G IVPB
3.3750 g | Freq: Three times a day (TID) | INTRAVENOUS | Status: DC
  Administered 2020-07-24 – 2020-07-28 (×13): 3.375 g via INTRAVENOUS
  Filled 2020-07-24 (×13): qty 50

## 2020-07-24 MED ORDER — LIDOCAINE-EPINEPHRINE 1 %-1:100000 IJ SOLN
20.0000 mL | Freq: Once | INTRAMUSCULAR | Status: DC
  Filled 2020-07-24: qty 1

## 2020-07-24 MED ORDER — OXYCODONE-ACETAMINOPHEN 5-325 MG PO TABS
2.0000 | ORAL_TABLET | ORAL | Status: DC | PRN
Start: 2020-07-24 — End: 2020-07-30
  Administered 2020-07-24 – 2020-07-30 (×28): 2 via ORAL
  Filled 2020-07-24 (×28): qty 2

## 2020-07-24 MED ORDER — HYDROMORPHONE HCL 1 MG/ML IJ SOLN
0.5000 mg | INTRAMUSCULAR | Status: DC | PRN
  Administered 2020-07-24 – 2020-07-30 (×13): 0.5 mg via INTRAVENOUS
  Filled 2020-07-24 (×13): qty 1

## 2020-07-24 MED ORDER — VANCOMYCIN HCL 1500 MG/300ML IV SOLN
1500.0000 mg | Freq: Two times a day (BID) | INTRAVENOUS | Status: DC
  Administered 2020-07-24 – 2020-07-28 (×8): 1500 mg via INTRAVENOUS
  Filled 2020-07-24 (×10): qty 300

## 2020-07-24 MED ORDER — ACETAMINOPHEN 325 MG PO TABS: 650.0000 mg | ORAL_TABLET | Freq: Four times a day (QID) | ORAL | Status: DC | PRN

## 2020-07-24 MED ORDER — ACETAMINOPHEN 650 MG RE SUPP: 650.0000 mg | Freq: Four times a day (QID) | RECTAL | Status: DC | PRN

## 2020-07-24 NOTE — Progress Notes (Signed)
I had a lengthy discussion today with Mr. Feig.  He is well-known to me.  He has severe arthritis of his ankle that is thought to be posttraumatic in nature.  He also has a history of gout.  He is not interested in surgery at this point.  We did discuss the severity of his cellulitis and the possibility for underlying septic arthritis.  Currently no growth on aspiration and negative gram stain with abundant mononuclear cells which is inconsistent with septic arthritis.  He does note significant improvement with IV antibiotics in the way of improvement of swelling and ability to bear weight and move his ankle without pain.    We will await culture results.  If cultures return positive we will have a discussion about the possibility for I&D versus amputation and possibly obtain an MRI scan.  For now would recommend continuing antibiotic therapy.  Patient is okay to have a diet from orthopedic standpoint as there is no plan for operative intervention as of yet.

## 2020-07-24 NOTE — Progress Notes (Signed)
Pharmacy Antibiotic Note  Andrew Haley is a 55 y.o. male admitted on 07/23/2020 with sepsis.  Pharmacy has been consulted for vancomycin and Zosyn dosing.  Plan: Vancomycin 2000mg  x1 then 1500mg  IV Q12H. Goal AUC 400-550.  Expected AUC 500.  SCr 0.82.  Zosyn 3.375g IV Q8H (4-hour infusion).  Height: 6\' 3"  (190.5 cm) Weight: 115.7 kg (255 lb) IBW/kg (Calculated) : 84.5  Temp (24hrs), Avg:98.7 F (37.1 C), Min:98.3 F (36.8 C), Max:99.1 F (37.3 C)  Recent Labs  Lab 07/18/20 1617 07/19/20 0531 07/23/20 1749 07/23/20 1847 07/23/20 2251  WBC 11.1* 9.9  --  12.2*  --   CREATININE 0.51*  --   --  0.82  --   LATICACIDVEN  --   --  0.9  --  1.1    Estimated Creatinine Clearance: 138 mL/min (by C-G formula based on SCr of 0.82 mg/dL).    No Known Allergies   Thank you for allowing pharmacy to be a part of this patient's care.  Wynona Neat, PharmD, BCPS  07/24/2020 12:38 AM

## 2020-07-24 NOTE — Hospital Course (Addendum)
I agree with the plan of care as per Dr. Hal Hope and have reviewed the patient early this morning and then have reassessed him frequently throughout the day  55 year old white male Class I obesity HTN COVID recovered 03/08/2020 OSA/CPAP--BMI 31 Chronic low back pain on opiates followed by outpatient physician Prior left ankle surgery, prior TKR 2016 HLD prostate cancer followed by Also history of gout  Recent hospitalization 6/8-07/19/2020@ [facility] Rx L LE cellulitis DC doxycycline twice daily Reevaluate [facility] 6/13-found to have?  Septic arthritis/joint effusion plus bony erosions tibia talus dorsal calcaneus left ankle based on CT-sepsis physiology with tachycardia  Dr. Lucia Gaskins evaluated the patient-DDx posttraumatic arthritis VS crystalline arthropathy VS inflammatory arthropathy--previously seen Dr. Doran Durand?  Planning ankle arthrodesis  Patient was admitted-kept n.p.o. and will eventually need consideration as per orthopedics discussion for BKA   Labs Potassium 2.9-->3.1 Bicarb 33-->30 BUNs/creatinine 15/0.8-->13/0.7 WBC 12.2-->10.9 Hemoglobin 10.5-->9.4 Platelet 428-->336  Blood culture X26/13 pending, fluid aspirate 6/14 = no organisms seen Synovial fluid count = no crystals, WBC 35,000 predominant neutrophilia and turbid appearance  BP (!) 149/86   Pulse 77   Temp 99.3 F (37.4 C) (Oral)   Resp (!) 24   Ht 6\' 3"  (1.905 m)   Wt 115.7 kg   SpO2 91%   BMI 31.87 kg/m   On exam he is arousable but quite sleepy he is snoring at the bedside he however awakens significantly when I tell him he may need surgery on his leg He seems rather incredulous to this and does not wish to have surgery tells me He starts to use expletives and I attempted to de-escalate the situation explained to him that he may not have many options if his leg continues to look poorly He has tracking cellulitis halfway up the left calf and his foot is swollen and extremely warm I do not appreciate any  purulence however external to his leg   We will cycle lactic acid now Continue LR 150 cc/h We will recheck CBC Chem-12 this afternoon We will continue pain control but adjusted to oral pain meds predominantly every 4 as needed and Dilaudid only for severe pain 0.5 every 4 as needed We will continue vancomycin and Zosyn broad-spectrum without de-escalation at this time  because he is somnolent (likely secondary to poor sleep last night in addition to IV pain meds) we will reassess him as needed  Patient apparently has spoken with orthopedics and refuses to have any operative management at this time despite that being mentioned to him  I spent 30 additional minutes critical care time in care coordination with orthopedics who is seeing him

## 2020-07-24 NOTE — H&P (Signed)
History and Physical    MIKKEL CHARRETTE QAS:[id number] DOB: 25-May-1965 DOA: 07/23/2020  PCP: Redmond School, MD  Patient coming from: Home.  Chief Complaint: Left ankle pain.  HPI: Andrew Haley is a 55 y.o. male with history of hypertension, sleep apnea, prostate cancer who has been having left ankle pain and swelling.  Patient has had left ankle surgery many years ago and has been having left ankle arthritis consistent with possibly post traumatic versus crystalline arthropathy.  Patient was admitted last week at [facility] for cellulitis was discharged home on antibiotic despite taking which patient swelling worsening along with worsening pain patient presented to the ER.  ED Course: In the ER on exam patient has significant swelling and erythema of the left ankle joint CT scan showing features concerning for possible septic arthritis orthopedic on-call Dr. Lucia Gaskins was consulted arthrocentesis was done results of which are pending.  Labs show patient has anemia with mild leukocytosis.  COVID test was negative.  Review of Systems: As per HPI, rest all negative.   Past Medical History:  Diagnosis Date   Arthritis    Class 1 obesity 08/19/8919   Complication of anesthesia    slow to wake up   Essential hypertension 07/18/2020   Hypertension    Hypertriglyceridemia    Numbness of fingers of both hands    Prostate cancer ([place]) 2017   Sleep apnea    CPAP    Past Surgical History:  Procedure Laterality Date   COLONOSCOPY WITH PROPOFOL N/A 09/28/2017   Procedure: COLONOSCOPY WITH PROPOFOL;  Surgeon: Daneil Dolin, MD;  Location: [facility];  Service: Endoscopy;  Laterality: N/A;  12:00pm   HAND SURGERY Right 2003   cysts    JOINT REPLACEMENT     LESION REMOVAL Left 09/23/2013   Procedure: MINOR EXCISION 3 CM SKIN LESION OF LEFT THIGH ;  Surgeon: Jamesetta So, MD;  Location: [facility] ORS;  Service: General;  Laterality: Left;   LYMPHADENECTOMY Bilateral 11/29/2015   Procedure:  LYMPHADENECTOMY;  Surgeon: Raynelle Bring, MD;  Location: [facility] ORS;  Service: Urology;  Laterality: Bilateral;   ROBOT ASSISTED LAPAROSCOPIC RADICAL PROSTATECTOMY N/A 11/29/2015   Procedure: XI ROBOTIC ASSISTED LAPAROSCOPIC RADICAL PROSTATECTOMY LEVEL 3;  Surgeon: Raynelle Bring, MD;  Location: [facility];  Service: Urology;  Laterality: N/A;   TOTAL KNEE ARTHROPLASTY Left 02/22/2014   Procedure: LEFT TOTAL KNEE ARTHROPLASTY;  Surgeon: Tobi Bastos, MD;  Location: [facility];  Service: Orthopedics;  Laterality: Left;   TOTAL KNEE ARTHROPLASTY Right 05/23/2014   Procedure: RIGHT TOTAL KNEE ARTHROPLASTY;  Surgeon: Latanya Maudlin, MD;  Location: [facility]S;  Service: Orthopedics;  Laterality: Right;     reports that he has never smoked. He has never used smokeless tobacco. He reports current alcohol use. He reports that he does not use drugs.  No Known Allergies  Family History  Problem Relation Age of Onset   Heart disease Mother    Throat cancer Father    Colon cancer Neg Hx    Colon polyps Neg Hx     Prior to Admission medications   Medication Sig Start Date End Date Taking? Authorizing Provider  allopurinol (ZYLOPRIM) 300 MG tablet Take 1 tablet (300 mg total) by mouth daily. 07/19/20 07/19/21 Yes Barton Dubois, MD  diphenhydrAMINE-APAP, sleep, (GOODY PM PO) Take 1 packet by mouth at bedtime as needed (sleep).   Yes [provider]  doxycycline (VIBRAMYCIN) 50 MG capsule Take 1 capsule (50 mg total) by  mouth 2 (two) times daily for 20 doses. 07/19/20 07/29/20 Yes Barton Dubois, MD  ibuprofen (ADVIL) 800 MG tablet Take 1 tablet (800 mg total) by mouth every 8 (eight) hours as needed. Patient taking differently: Take 800 mg by mouth every 8 (eight) hours as needed for headache or fever. 07/19/20  Yes Barton Dubois, MD  losartan-hydrochlorothiazide (HYZAAR) 50-12.5 MG tablet Take 1 tablet by mouth daily. 07/19/20 07/19/21 Yes Barton Dubois, MD  methocarbamol (ROBAXIN-750) 750 MG tablet Take 1 tablet  (750 mg total) by mouth every 8 (eight) hours as needed for muscle spasms. 07/19/20  Yes Barton Dubois, MD  oxyCODONE-acetaminophen (PERCOCET) 10-325 MG tablet Take 1 tablet by mouth every 4 (four) hours as needed for pain. 06/27/20  Yes [provider]    Physical Exam: Constitutional: Moderately built and nourished. Vitals:   07/23/20 2016 07/23/20 2221 07/23/20 2223 07/23/20 2243  BP: 140/86 (!) 163/110  (!) 143/92  Pulse: (!) 103 (!) 51 (!) 101 99  Resp: 16 (!) 27 (!) 22 20  Temp:      TempSrc:      SpO2: 99%  100% 95%  Weight:      Height:       Eyes: Anicteric no pallor. ENMT: No discharge from the ears eyes nose and mouth. Neck: No mass felt.  No neck rigidity. Respiratory: No rhonchi or crepitations. Cardiovascular: S1-S2 heard. Abdomen: Soft nontender bowel sound present. Musculoskeletal: Left ankle swollen with erythema. Skin: Erythema of the left ankle. Neurologic: Alert awake oriented time place and person.  Moves all extremities. Psychiatric: Appears normal.  Normal affect.   Labs on Admission: I have personally reviewed following labs and imaging studies  CBC: Recent Labs  Lab 07/18/20 1617 07/19/20 0531 07/23/20 1847  WBC 11.1* 9.9 12.2*  NEUTROABS 9.0* 8.4* 10.3*  HGB 11.2* 10.1* 10.5*  HCT 36.8* 32.7* 33.8*  MCV 85.6 86.1 82.4  PLT 323 245 242*   Basic Metabolic Panel: Recent Labs  Lab 07/18/20 1617 07/23/20 1847  NA 136 132*  K 3.7 2.9*  CL 100 89*  CO2 28 33*  GLUCOSE 117* 105*  BUN 10 15  CREATININE 0.51* 0.82  CALCIUM 8.3* 9.0   GFR: Estimated Creatinine Clearance: 138 mL/min (by C-G formula based on SCr of 0.82 mg/dL). Liver Function Tests: Recent Labs  Lab 07/18/20 1617 07/23/20 1847  AST 17 16  ALT 14 15  ALKPHOS 78 81  BILITOT 0.8 0.5  PROT 7.2 8.4*  ALBUMIN 3.6 2.7*   No results for input(s): LIPASE, AMYLASE in the last 168 hours. No results for input(s): AMMONIA in the last 168 hours. Coagulation  Profile: Recent Labs  Lab 07/23/20 1847  INR 1.1   Cardiac Enzymes: No results for input(s): CKTOTAL, CKMB, CKMBINDEX, TROPONINI in the last 168 hours. BNP (last 3 results) No results for input(s): PROBNP in the last 8760 hours. HbA1C: No results for input(s): HGBA1C in the last 72 hours. CBG: No results for input(s): GLUCAP in the last 168 hours. Lipid Profile: No results for input(s): CHOL, HDL, LDLCALC, TRIG, CHOLHDL, LDLDIRECT in the last 72 hours. Thyroid Function Tests: No results for input(s): TSH, T4TOTAL, FREET4, T3FREE, THYROIDAB in the last 72 hours. Anemia Panel: No results for input(s): VITAMINB12, FOLATE, FERRITIN, TIBC, IRON, RETICCTPCT in the last 72 hours. Urine analysis:    Component Value Date/Time   COLORURINE YELLOW 07/23/2020 1749   APPEARANCEUR CLEAR 07/23/2020 1749   LABSPEC 1.013 07/23/2020 1749   PHURINE 5.0 07/23/2020 1749  GLUCOSEU NEGATIVE 07/23/2020 1749   HGBUR NEGATIVE 07/23/2020 1749   BILIRUBINUR NEGATIVE 07/23/2020 1749   KETONESUR NEGATIVE 07/23/2020 1749   PROTEINUR NEGATIVE 07/23/2020 1749   UROBILINOGEN 1.0 05/25/2014 0822   NITRITE NEGATIVE 07/23/2020 1749   LEUKOCYTESUR NEGATIVE 07/23/2020 1749   Sepsis Labs: @LABRCNTIP (procalcitonin:4,lacticidven:4) ) Recent Results (from the past 240 hour(s))  Resp Panel by RT-PCR (Flu A&B, Covid) Nasopharyngeal Swab     Status: None   Collection Time: 07/18/20  5:56 PM   Specimen: Nasopharyngeal Swab; Nasopharyngeal(NP) swabs in vial transport medium  Result Value Ref Range Status   SARS Coronavirus 2 by RT PCR NEGATIVE NEGATIVE Final    Comment: (NOTE) SARS-CoV-2 target nucleic acids are NOT DETECTED.  The SARS-CoV-2 RNA is generally detectable in upper respiratory specimens during the acute phase of infection. The lowest concentration of SARS-CoV-2 viral copies this assay can detect is 138 copies/mL. A negative result does not preclude SARS-Cov-2 infection and should not be used as the  sole basis for treatment or other patient management decisions. A negative result may occur with  improper specimen collection/handling, submission of specimen other than nasopharyngeal swab, presence of viral mutation(s) within the areas targeted by this assay, and inadequate number of viral copies(<138 copies/mL). A negative result must be combined with clinical observations, patient history, and epidemiological information. The expected result is Negative.  Fact Sheet for Patients:  EntrepreneurPulse.com.au  Fact Sheet for Healthcare Providers:  IncredibleEmployment.be  This test is no t yet approved or cleared by the [place] FDA and  has been authorized for detection and/or diagnosis of SARS-CoV-2 by FDA under an Emergency Use Authorization (EUA). This EUA will remain  in effect (meaning this test can be used) for the duration of the COVID-19 declaration under Section 564(b)(1) of the Act, 21 U.S.C.section 360bbb-3(b)(1), unless the authorization is terminated  or revoked sooner.       Influenza A by PCR NEGATIVE NEGATIVE Final   Influenza B by PCR NEGATIVE NEGATIVE Final    Comment: (NOTE) The Xpert Xpress SARS-CoV-2/FLU/RSV plus assay is intended as an aid in the diagnosis of influenza from Nasopharyngeal swab specimens and should not be used as a sole basis for treatment. Nasal washings and aspirates are unacceptable for Xpert Xpress SARS-CoV-2/FLU/RSV testing.  Fact Sheet for Patients: EntrepreneurPulse.com.au  Fact Sheet for Healthcare Providers: IncredibleEmployment.be  This test is not yet approved or cleared by the [place] FDA and has been authorized for detection and/or diagnosis of SARS-CoV-2 by FDA under an Emergency Use Authorization (EUA). This EUA will remain in effect (meaning this test can be used) for the duration of the COVID-19 declaration under Section 564(b)(1) of the  Act, 21 U.S.C. section 360bbb-3(b)(1), unless the authorization is terminated or revoked.  Performed at [facility], [address], [place], [place] [address]   Resp Panel by RT-PCR (Flu A&B, Covid) Nasopharyngeal Swab     Status: None   Collection Time: 07/23/20 10:44 PM   Specimen: Nasopharyngeal Swab; Nasopharyngeal(NP) swabs in vial transport medium  Result Value Ref Range Status   SARS Coronavirus 2 by RT PCR NEGATIVE NEGATIVE Final    Comment: (NOTE) SARS-CoV-2 target nucleic acids are NOT DETECTED.  The SARS-CoV-2 RNA is generally detectable in upper respiratory specimens during the acute phase of infection. The lowest concentration of SARS-CoV-2 viral copies this assay can detect is 138 copies/mL. A negative result does not preclude SARS-Cov-2 infection and should not be used as the sole basis for treatment or other patient  management decisions. A negative result may occur with  improper specimen collection/handling, submission of specimen other than nasopharyngeal swab, presence of viral mutation(s) within the areas targeted by this assay, and inadequate number of viral copies(<138 copies/mL). A negative result must be combined with clinical observations, patient history, and epidemiological information. The expected result is Negative.  Fact Sheet for Patients:  EntrepreneurPulse.com.au  Fact Sheet for Healthcare Providers:  IncredibleEmployment.be  This test is no t yet approved or cleared by the [place] FDA and  has been authorized for detection and/or diagnosis of SARS-CoV-2 by FDA under an Emergency Use Authorization (EUA). This EUA will remain  in effect (meaning this test can be used) for the duration of the COVID-19 declaration under Section 564(b)(1) of the Act, 21 U.S.C.section 360bbb-3(b)(1), unless the authorization is terminated  or revoked sooner.       Influenza A by PCR NEGATIVE NEGATIVE Final   Influenza B  by PCR NEGATIVE NEGATIVE Final    Comment: (NOTE) The Xpert Xpress SARS-CoV-2/FLU/RSV plus assay is intended as an aid in the diagnosis of influenza from Nasopharyngeal swab specimens and should not be used as a sole basis for treatment. Nasal washings and aspirates are unacceptable for Xpert Xpress SARS-CoV-2/FLU/RSV testing.  Fact Sheet for Patients: EntrepreneurPulse.com.au  Fact Sheet for Healthcare Providers: IncredibleEmployment.be  This test is not yet approved or cleared by the [place] FDA and has been authorized for detection and/or diagnosis of SARS-CoV-2 by FDA under an Emergency Use Authorization (EUA). This EUA will remain in effect (meaning this test can be used) for the duration of the COVID-19 declaration under Section 564(b)(1) of the Act, 21 U.S.C. section 360bbb-3(b)(1), unless the authorization is terminated or revoked.  Performed at [facility], [place] [address], [place], [place] [address]      Radiological Exams on Admission: DG Chest 1 View  Result Date: 07/23/2020 CLINICAL DATA:  Swelling and redness of the foot EXAM: CHEST  1 VIEW COMPARISON:  11/28/2015 FINDINGS: The heart size and mediastinal contours are within normal limits. Both lungs are clear. The visualized skeletal structures are unremarkable. IMPRESSION: No active disease. Electronically Signed   By: Donavan Foil M.D.   On: 07/23/2020 19:52   CT EXTREMITY LOWER LEFT WO CONTRAST  Result Date: 07/23/2020 CLINICAL DATA:  Left leg redness and swelling, leukocytosis EXAM: CT OF THE LOWER LEFT EXTREMITY WITHOUT CONTRAST TECHNIQUE: Multidetector CT imaging of the lower left extremity was performed according to the standard protocol. Field of view is from the left hip through the left foot. COMPARISON:  X-ray 07/18/2020, CT 02/29/2020 FINDINGS: Bones/Joint/Cartilage No acute fracture the left femur. Hip joint is intact with mild arthropathy. No appreciable hip  joint effusion. Status post left total knee arthroplasty. Extensive metallic streak artifact related to patient's hardware degrades evaluation of the adjacent structures. Within this limitation. There is no evidence of periprosthetic fracture or hardware loosening. Small knee joint effusion with synovial thickening. Severe degenerative changes of the tibiotalar joint with widening of the joint space and progressive erosions along both sides of the joint as well as throughout the medial and lateral malleoli. Additionally, there are erosive changes associated with the subtalar joint and along the posterior aspect of the calcaneus. Suspected erosions along the dorsal aspect of the navicular bone. No tibiotalar joint dislocation. Prior fibular ORIF hardware. Degenerative changes of the forefoot, most pronounced at the first MTP joint where there is hallux valgus deformity. Large complex tibiotalar joint effusion containing debris. Ligaments Suboptimally  assessed by CT. Muscles and Tendons Suspect large peroneal tenosynovial fluid collection (series 11, image 113). Fatty infiltration of the calf musculature. Soft tissues Circumferential subcutaneous edema throughout the lower leg. Marked diffuse soft tissue swelling and edema of the ankle and dorsal foot. No soft tissue gas. IMPRESSION: 1. Findings suggestive of septic arthritis of the left ankle with large complex tibiotalar joint effusion containing debris. New erosions centered at the subtalar and talonavicular joints also suspicious for septic arthritis. Orthopedic surgery consultation recommended. 2. Suspect large peroneal tenosynovial fluid collection which could be septic or reactive. 3. Circumferential subcutaneous edema throughout the lower leg, ankle, and dorsal foot, which may reflect cellulitis. No soft tissue gas. 4. Status post left total knee arthroplasty without evidence of periprosthetic fracture or hardware loosening. Electronically Signed   By:  Davina Poke D.O.   On: 07/23/2020 19:57    EKG: Independently reviewed.  Normal sinus rhythm nonspecific ST-T changes.  Assessment/Plan Principal Problem:   Septic arthritis ([place]) Active Problems:   Prostate cancer ([place])   Essential hypertension    Possible septic arthritis of the left ankle for which patient has been empirically started antibiotics.  Arthrocentesis was done and results of which are pending.  Orthopedic surgeon has been consulted.  We will follow the recommendations. Hypertension we will keep patient n.p.o. and IV hydralazine since patient is n.p.o. for now in anticipation of possible surgery. Hypokalemia likely related to diuretics.  Hold diuretics replace potassium and recheck. Chronic anemia follow CBC. Sleep apnea on CPAP at bedtime.  Given that patient has possible septic arthritis of the left ankle and will need close monitoring for any further worsening inpatient status.   DVT prophylaxis: SCDs.  Avoiding anticoagulation in anticipation of possible procedure. Code Status: Full code. Family Communication: Discussed with patient. Disposition Plan: To be determined. Consults called: Orthopedics. Admission status: Inpatient.   Rise Patience MD [facility] Pager [phone].  If 7PM-7AM, please contact night-coverage [email or web address] Password [email or web address]  07/24/2020, 12:09 AM

## 2020-07-24 NOTE — ED Notes (Signed)
Pt placed on 2L  due to o2 sats 84% while sleeping.

## 2020-07-24 NOTE — ED Provider Notes (Signed)
  Physical Exam  BP (!) 143/92 (BP Location: Right Arm)   Pulse 99   Temp 99.1 F (37.3 C)   Resp 20   Ht 6\' 3"  (1.905 m)   Wt 115.7 kg   SpO2 95%   BMI 31.87 kg/m   Physical Exam Cardiovascular:     Rate and Rhythm: Tachycardia present.  Musculoskeletal:        General: Swelling and tenderness present.     Left lower leg: Edema present.  Skin:    Findings: Erythema and rash present.    ED Course/Procedures     .Joint Aspiration/Arthrocentesis  Date/Time: 07/24/2020 12:30 AM Performed by: Merrily Pew, MD Authorized by: Merrily Pew, MD   Consent:    Consent obtained:  Verbal   Consent given by:  Patient   Risks, benefits, and alternatives were discussed: yes     Risks discussed:  Bleeding, incomplete drainage, pain, poor cosmetic result and nerve damage   Alternatives discussed:  No treatment, delayed treatment, alternative treatment, observation and referral Universal protocol:    Procedure explained and questions answered to patient or proxy's satisfaction: yes     Relevant documents present and verified: yes     Test results available: yes     Imaging studies available: yes     Site/side marked: yes   Location:    Location:  Ankle   Ankle:  L ankle Anesthesia:    Anesthesia method:  Local infiltration   Local anesthetic:  Lidocaine 2% WITH epi Procedure details:    Preparation: Patient was prepped and draped in usual sterile fashion     Needle gauge:  18 G   Ultrasound guidance: no     Approach:  Medial   Aspirate amount:  10cc   Aspirate characteristics:  Blood-tinged and cloudy   Steroid injected: no     Specimen collected: yes   Post-procedure details:    Dressing:  Sterile dressing   Procedure completion:  Tolerated  MDM  Being admitted for septic arthritis. I was asked by orthopedics to perform arthrocentesis. Abx already given. All discussions and management done by Drs. Antony Blackbird and Lucia Gaskins, I only evaluated the leg in question for  the purposes of doing an arthrocentesis.        Kayden Amend, Corene Cornea, MD 07/24/20 [phone]

## 2020-07-24 NOTE — ED Notes (Signed)
Provider at the bedside.  

## 2020-07-24 NOTE — Progress Notes (Addendum)
I agree with the plan of care as per Dr. Hal Hope and have reviewed the patient early this morning and then have reassessed him frequently throughout the day  55 year old white male Class I obesity HTN COVID recovered 03/08/2020 OSA/CPAP--BMI 31 Chronic low back pain on opiates followed by outpatient physician Prior left ankle surgery, prior TKR 2016 HLD localized adenocarcinoma of the prostate (clinical stage T1c Nx Mx) prostate cancer  s/p radical prastatectomy followed by Dr. Alinda Money Also history of gout  Recent hospitalization 6/8-07/19/2020@ [facility] Rx L LE cellulitis DC doxycycline twice daily Reevaluate [facility] 6/13-found to have?  Septic arthritis/joint effusion plus bony erosions tibia talus dorsal calcaneus left ankle based on CT-sepsis physiology with tachycardia  Dr. Lucia Gaskins evaluated the patient-DDx posttraumatic arthritis VS crystalline arthropathy VS inflammatory arthropathy--previously seen Dr. Doran Durand?  Planning ankle arthrodesis  Patient was admitted-kept n.p.o. and will eventually need consideration as per orthopedics discussion for BKA   Labs Potassium 2.9-->3.1 Bicarb 33-->30 BUNs/creatinine 15/0.8-->13/0.7 WBC 12.2-->10.9 Hemoglobin 10.5-->9.4 Platelet 428-->336  Blood culture X26/13 pending, fluid aspirate 6/14 = no organisms seen Synovial fluid count = no crystals, WBC 35,000 predominant neutrophilia and turbid appearance  BP (!) 149/86   Pulse 77   Temp 99.3 F (37.4 C) (Oral)   Resp (!) 24   Ht 6\' 3"  (1.905 m)   Wt 115.7 kg   SpO2 91%   BMI 31.87 kg/m   On exam he is arousable but quite sleepy he is snoring at the bedside he however awakens significantly when I tell him he may need surgery on his leg He seems rather incredulous to this and does not wish to have surgery tells me He starts to use expletives and I attempted to de-escalate the situation explained to him that he may not have many options if his leg continues to look poorly He has tracking  cellulitis halfway up the left calf and his foot is swollen and extremely warm I do not appreciate any purulence however external to his leg   We will cycle lactic acid now Continue LR 150 cc/h We will recheck CBC Chem-12 this afternoon We will continue pain control but adjusted to oral pain meds predominantly every 4 as needed and Dilaudid only for severe pain 0.5 every 4 as needed We will continue vancomycin and Zosyn broad-spectrum without de-escalation at this time  because he is somnolent (likely secondary to poor sleep last night in addition to IV pain meds) we will reassess him as needed  Patient apparently has spoken with orthopedics and refuses to have any operative management at this time despite that being mentioned to him  CRITICAL CARE Performed by: Nita Sells   Total critical care time: 30 minutes  Critical care time was exclusive of separately billable procedures and treating other patients.  Critical care was necessary to treat or prevent imminent or life-threatening deterioration.  Critical care was time spent personally by me on the following activities: development of treatment plan with patient and/or surrogate as well as nursing, discussions with consultants, evaluation of patient's response to treatment, examination of patient, obtaining history from patient or surrogate, ordering and performing treatments and interventions, ordering and review of laboratory studies, ordering and review of radiographic studies, pulse oximetry and re-evaluation of patient's condition.   I spent 30 additional minutes critical care time in care coordination with orthopedics who is seeing him.  Verneita Griffes, MD [facility] 11:19 AM

## 2020-07-24 NOTE — Consult Note (Signed)
Reason for Consult: Left ankle cellulitis Referring Physician: Verneita Griffes Time called: 0902 Time at bedside: 0940   Andrew Haley is an 55 y.o. male.  HPI: Andrew Haley comes in with a 10d hx/o LLE pain, redness, and swelling. He was seen at [facility], admitted, and discharged home on abx but continued to worsen. He also admits to fevers and chills. He has a long standing hx/o likely post-traumatic ankle arthritis. He also has long-standing bony erosion of the distal tibia and tarsal bones. He thinks his pain and redness have improved overnight with the IV abx.  Past Medical History:  Diagnosis Date   Arthritis    Class 1 obesity 06/13/2704   Complication of anesthesia    slow to wake up   Essential hypertension 07/18/2020   Hypertension    Hypertriglyceridemia    Numbness of fingers of both hands    Prostate cancer ([place]) 2017   Sleep apnea    CPAP    Past Surgical History:  Procedure Laterality Date   COLONOSCOPY WITH PROPOFOL N/A 09/28/2017   Procedure: COLONOSCOPY WITH PROPOFOL;  Surgeon: Daneil Dolin, MD;  Location: [facility];  Service: Endoscopy;  Laterality: N/A;  12:00pm   HAND SURGERY Right 2003   cysts    JOINT REPLACEMENT     LESION REMOVAL Left 09/23/2013   Procedure: MINOR EXCISION 3 CM SKIN LESION OF LEFT THIGH ;  Surgeon: Jamesetta So, MD;  Location: [facility];  Service: General;  Laterality: Left;   LYMPHADENECTOMY Bilateral 11/29/2015   Procedure: LYMPHADENECTOMY;  Surgeon: Raynelle Bring, MD;  Location: [facility];  Service: Urology;  Laterality: Bilateral;   ROBOT ASSISTED LAPAROSCOPIC RADICAL PROSTATECTOMY N/A 11/29/2015   Procedure: XI ROBOTIC ASSISTED LAPAROSCOPIC RADICAL PROSTATECTOMY LEVEL 3;  Surgeon: Raynelle Bring, MD;  Location: [facility];  Service: Urology;  Laterality: N/A;   TOTAL KNEE ARTHROPLASTY Left 02/22/2014   Procedure: LEFT TOTAL KNEE ARTHROPLASTY;  Surgeon: Tobi Bastos, MD;  Location: [facility];  Service: Orthopedics;  Laterality: Left;   TOTAL KNEE  ARTHROPLASTY Right 05/23/2014   Procedure: RIGHT TOTAL KNEE ARTHROPLASTY;  Surgeon: Latanya Maudlin, MD;  Location: [facility];  Service: Orthopedics;  Laterality: Right;    Family History  Problem Relation Age of Onset   Heart disease Mother    Throat cancer Father    Colon cancer Neg Hx    Colon polyps Neg Hx     Social History:  reports that he has never smoked. He has never used smokeless tobacco. He reports current alcohol use. He reports that he does not use drugs.  Allergies: No Known Allergies  Medications: I have reviewed the patient's current medications.  Results for orders placed or performed during the hospital encounter of 07/23/20 (from the past 48 hour(s))  Lactic acid, plasma     Status: None   Collection Time: 07/23/20  5:49 PM  Result Value Ref Range   Lactic Acid, Venous 0.9 0.5 - 1.9 mmol/L    Comment: Performed at [facility], [address], [place], [place] [address]  Urinalysis, Routine w reflex microscopic     Status: None   Collection Time: 07/23/20  5:49 PM  Result Value Ref Range   Color, Urine YELLOW YELLOW   APPearance CLEAR CLEAR   Specific Gravity, Urine 1.013 1.005 - 1.030   pH 5.0 5.0 - 8.0   Glucose, UA NEGATIVE NEGATIVE mg/dL   Hgb urine dipstick NEGATIVE NEGATIVE   Bilirubin Urine NEGATIVE NEGATIVE  Ketones, ur NEGATIVE NEGATIVE mg/dL   Protein, ur NEGATIVE NEGATIVE mg/dL   Nitrite NEGATIVE NEGATIVE   Leukocytes,Ua NEGATIVE NEGATIVE    Comment: Performed at [facility] [address], [place], [place] [address]  Culture, blood (Routine x 2)     Status: None (Preliminary result)   Collection Time: 07/23/20  6:19 PM   Specimen: BLOOD  Result Value Ref Range   Specimen Description BLOOD RIGHT ANTECUBITAL    Special Requests      BOTTLES DRAWN AEROBIC ONLY Blood Culture adequate volume   Culture      NO GROWTH < 12 HOURS Performed at [facility] [address] [place], [place] [address]    Report Status PENDING    Culture, blood (Routine x 2)     Status: None (Preliminary result)   Collection Time: 07/23/20  6:19 PM   Specimen: BLOOD  Result Value Ref Range   Specimen Description BLOOD LEFT ANTECUBITAL    Special Requests      BOTTLES DRAWN AEROBIC ONLY Blood Culture results may not be optimal due to an inadequate volume of blood received in culture bottles   Culture      NO GROWTH < 12 HOURS Performed at [facility] [address], [place], [place] [address]    Report Status PENDING   Comprehensive metabolic panel     Status: Abnormal   Collection Time: 07/23/20  6:47 PM  Result Value Ref Range   Sodium 132 (L) 135 - 145 mmol/L   Potassium 2.9 (L) 3.5 - 5.1 mmol/L   Chloride 89 (L) 98 - 111 mmol/L   CO2 33 (H) 22 - 32 mmol/L   Glucose, Bld 105 (H) 70 - 99 mg/dL    Comment: Glucose reference range applies only to samples taken after fasting for at least 8 hours.   BUN 15 6 - 20 mg/dL   Creatinine, Ser 0.82 0.61 - 1.24 mg/dL   Calcium 9.0 8.9 - 10.3 mg/dL   Total Protein 8.4 (H) 6.5 - 8.1 g/dL   Albumin 2.7 (L) 3.5 - 5.0 g/dL   AST 16 15 - 41 U/L   ALT 15 0 - 44 U/L   Alkaline Phosphatase 81 38 - 126 U/L   Total Bilirubin 0.5 0.3 - 1.2 mg/dL   GFR, Estimated >60 >60 mL/min    Comment: (NOTE) Calculated using the CKD-EPI Creatinine Equation (2021)    Anion gap 10 5 - 15    Comment: Performed at [facility], [place] [address], [place], [place] [address]  CBC with Differential     Status: Abnormal   Collection Time: 07/23/20  6:47 PM  Result Value Ref Range   WBC 12.2 (H) 4.0 - 10.5 K/uL   RBC 4.10 (L) 4.22 - 5.81 MIL/uL   Hemoglobin 10.5 (L) 13.0 - 17.0 g/dL   HCT 33.8 (L) 39.0 - 52.0 %   MCV 82.4 80.0 - 100.0 fL   MCH 25.6 (L) 26.0 - 34.0 pg   MCHC 31.1 30.0 - 36.0 g/dL   RDW 12.9 11.5 - 15.5 %   Platelets 428 (H) 150 - 400 K/uL   nRBC 0.0 0.0 - 0.2 %   Neutrophils Relative % 84 %   Neutro Abs 10.3 (H) 1.7 - 7.7 K/uL   Lymphocytes Relative 6 %   Lymphs Abs 0.7  0.7 - 4.0 K/uL   Monocytes Relative 8 %   Monocytes Absolute 0.9 0.1 - 1.0 K/uL  Eosinophils Relative 2 %   Eosinophils Absolute 0.2 0.0 - 0.5 K/uL   Basophils Relative 0 %   Basophils Absolute 0.1 0.0 - 0.1 K/uL   Immature Granulocytes 0 %   Abs Immature Granulocytes 0.05 0.00 - 0.07 K/uL    Comment: Performed at [facility] [address], [place], [place] [address]  Protime-INR     Status: None   Collection Time: 07/23/20  6:47 PM  Result Value Ref Range   Prothrombin Time 14.1 11.4 - 15.2 seconds   INR 1.1 0.8 - 1.2    Comment: (NOTE) INR goal varies based on device and disease states. Performed at [facility], [place] [address], [place], [place] [address]   Resp Panel by RT-PCR (Flu A&B, Covid) Nasopharyngeal Swab     Status: None   Collection Time: 07/23/20 10:44 PM   Specimen: Nasopharyngeal Swab; Nasopharyngeal(NP) swabs in vial transport medium  Result Value Ref Range   SARS Coronavirus 2 by RT PCR NEGATIVE NEGATIVE    Comment: (NOTE) SARS-CoV-2 target nucleic acids are NOT DETECTED.  The SARS-CoV-2 RNA is generally detectable in upper respiratory specimens during the acute phase of infection. The lowest concentration of SARS-CoV-2 viral copies this assay can detect is 138 copies/mL. A negative result does not preclude SARS-Cov-2 infection and should not be used as the sole basis for treatment or other patient management decisions. A negative result may occur with  improper specimen collection/handling, submission of specimen other than nasopharyngeal swab, presence of viral mutation(s) within the areas targeted by this assay, and inadequate number of viral copies(<138 copies/mL). A negative result must be combined with clinical observations, patient history, and epidemiological information. The expected result is Negative.  Fact Sheet for Patients:  EntrepreneurPulse.com.au  Fact Sheet for Healthcare Providers:   IncredibleEmployment.be  This test is no t yet approved or cleared by the [place] FDA and  has been authorized for detection and/or diagnosis of SARS-CoV-2 by FDA under an Emergency Use Authorization (EUA). This EUA will remain  in effect (meaning this test can be used) for the duration of the COVID-19 declaration under Section 564(b)(1) of the Act, 21 U.S.C.section 360bbb-3(b)(1), unless the authorization is terminated  or revoked sooner.       Influenza A by PCR NEGATIVE NEGATIVE   Influenza B by PCR NEGATIVE NEGATIVE    Comment: (NOTE) The Xpert Xpress SARS-CoV-2/FLU/RSV plus assay is intended as an aid in the diagnosis of influenza from Nasopharyngeal swab specimens and should not be used as a sole basis for treatment. Nasal washings and aspirates are unacceptable for Xpert Xpress SARS-CoV-2/FLU/RSV testing.  Fact Sheet for Patients: EntrepreneurPulse.com.au  Fact Sheet for Healthcare Providers: IncredibleEmployment.be  This test is not yet approved or cleared by the [place] FDA and has been authorized for detection and/or diagnosis of SARS-CoV-2 by FDA under an Emergency Use Authorization (EUA). This EUA will remain in effect (meaning this test can be used) for the duration of the COVID-19 declaration under Section 564(b)(1) of the Act, 21 U.S.C. section 360bbb-3(b)(1), unless the authorization is terminated or revoked.  Performed at [facility], [place] [address], [place], [place] [address]   Lactic acid, plasma     Status: None   Collection Time: 07/23/20 10:51 PM  Result Value Ref Range   Lactic Acid, Venous 1.1 0.5 - 1.9 mmol/L    Comment: Performed at [facility] [address] [place], [place] [address]  Body fluid culture  w Gram Stain     Status: None (Preliminary result)   Collection Time: 07/24/20 12:27 AM   Specimen: Body Fluid  Result Value Ref Range   Specimen Description  FLUID ANKLE    Special Requests NONE    Gram Stain      WBC PRESENT, PREDOMINANTLY MONONUCLEAR NO ORGANISMS SEEN CYTOSPIN SMEAR Performed at [facility], [address] [place], [place] [address]    Culture PENDING    Report Status PENDING   Synovial cell count + diff, w/ crystals     Status: Abnormal   Collection Time: 07/24/20 12:36 AM  Result Value Ref Range   Color, Synovial PINK (A) YELLOW   Appearance-Synovial TURBID (A) CLEAR   Crystals, Fluid NO CRYSTALS SEEN    WBC, Synovial 35,000 (H) 0 - 200 /cu mm   Neutrophil, Synovial 99 (H) 0 - 25 %   Lymphocytes-Synovial Fld 1 0 - 20 %   Monocyte-Macrophage-Synovial Fluid 0 (L) 50 - 90 %   Eosinophils-Synovial 0 0 - 1 %    Comment: Performed at [facility] [address], [place], [place] [address]  Basic metabolic panel     Status: Abnormal   Collection Time: 07/24/20  5:12 AM  Result Value Ref Range   Sodium 134 (L) 135 - 145 mmol/L   Potassium 3.1 (L) 3.5 - 5.1 mmol/L   Chloride 93 (L) 98 - 111 mmol/L   CO2 30 22 - 32 mmol/L   Glucose, Bld 127 (H) 70 - 99 mg/dL    Comment: Glucose reference range applies only to samples taken after fasting for at least 8 hours.   BUN 13 6 - 20 mg/dL   Creatinine, Ser 0.76 0.61 - 1.24 mg/dL   Calcium 8.7 (L) 8.9 - 10.3 mg/dL   GFR, Estimated >60 >60 mL/min    Comment: (NOTE) Calculated using the CKD-EPI Creatinine Equation (2021)    Anion gap 11 5 - 15    Comment: Performed at [facility] [address], [place], [place] [address]  CBC     Status: Abnormal   Collection Time: 07/24/20  5:12 AM  Result Value Ref Range   WBC 10.9 (H) 4.0 - 10.5 K/uL   RBC 3.65 (L) 4.22 - 5.81 MIL/uL   Hemoglobin 9.4 (L) 13.0 - 17.0 g/dL   HCT 30.4 (L) 39.0 - 52.0 %   MCV 83.3 80.0 - 100.0 fL   MCH 25.8 (L) 26.0 - 34.0 pg   MCHC 30.9 30.0 - 36.0 g/dL   RDW 13.1 11.5 - 15.5 %   Platelets 336 150 - 400 K/uL   nRBC 0.0 0.0 - 0.2 %    Comment: Performed at [facility],  [place] [address] [place], [place] [address]  CBG monitoring, ED     Status: Abnormal   Collection Time: 07/24/20  6:18 AM  Result Value Ref Range   Glucose-Capillary 114 (H) 70 - 99 mg/dL    Comment: Glucose reference range applies only to samples taken after fasting for at least 8 hours.    DG Chest 1 View  Result Date: 07/23/2020 CLINICAL DATA:  Swelling and redness of the foot EXAM: CHEST  1 VIEW COMPARISON:  11/28/2015 FINDINGS: The heart size and mediastinal contours are within normal limits. Both lungs are clear. The visualized skeletal structures are unremarkable. IMPRESSION: No active disease. Electronically Signed   By: Donavan Foil M.D.   On: 07/23/2020 19:52   CT  EXTREMITY LOWER LEFT WO CONTRAST  Result Date: 07/23/2020 CLINICAL DATA:  Left leg redness and swelling, leukocytosis EXAM: CT OF THE LOWER LEFT EXTREMITY WITHOUT CONTRAST TECHNIQUE: Multidetector CT imaging of the lower left extremity was performed according to the standard protocol. Field of view is from the left hip through the left foot. COMPARISON:  X-ray 07/18/2020, CT 02/29/2020 FINDINGS: Bones/Joint/Cartilage No acute fracture the left femur. Hip joint is intact with mild arthropathy. No appreciable hip joint effusion. Status post left total knee arthroplasty. Extensive metallic streak artifact related to patient's hardware degrades evaluation of the adjacent structures. Within this limitation. There is no evidence of periprosthetic fracture or hardware loosening. Small knee joint effusion with synovial thickening. Severe degenerative changes of the tibiotalar joint with widening of the joint space and progressive erosions along both sides of the joint as well as throughout the medial and lateral malleoli. Additionally, there are erosive changes associated with the subtalar joint and along the posterior aspect of the calcaneus. Suspected erosions along the dorsal aspect of the navicular bone. No tibiotalar joint dislocation.  Prior fibular ORIF hardware. Degenerative changes of the forefoot, most pronounced at the first MTP joint where there is hallux valgus deformity. Large complex tibiotalar joint effusion containing debris. Ligaments Suboptimally assessed by CT. Muscles and Tendons Suspect large peroneal tenosynovial fluid collection (series 11, image 113). Fatty infiltration of the calf musculature. Soft tissues Circumferential subcutaneous edema throughout the lower leg. Marked diffuse soft tissue swelling and edema of the ankle and dorsal foot. No soft tissue gas. IMPRESSION: 1. Findings suggestive of septic arthritis of the left ankle with large complex tibiotalar joint effusion containing debris. New erosions centered at the subtalar and talonavicular joints also suspicious for septic arthritis. Orthopedic surgery consultation recommended. 2. Suspect large peroneal tenosynovial fluid collection which could be septic or reactive. 3. Circumferential subcutaneous edema throughout the lower leg, ankle, and dorsal foot, which may reflect cellulitis. No soft tissue gas. 4. Status post left total knee arthroplasty without evidence of periprosthetic fracture or hardware loosening. Electronically Signed   By: Davina Poke D.O.   On: 07/23/2020 19:57    Review of Systems  Constitutional:  Positive for chills and fever. Negative for diaphoresis.  HENT:  Negative for ear discharge, ear pain, hearing loss and tinnitus.   Eyes:  Negative for photophobia and pain.  Respiratory:  Negative for cough and shortness of breath.   Cardiovascular:  Negative for chest pain.  Gastrointestinal:  Negative for abdominal pain, nausea and vomiting.  Genitourinary:  Negative for dysuria, flank pain, frequency and urgency.  Musculoskeletal:  Positive for arthralgias (Left ankle/lower leg). Negative for back pain, myalgias and neck pain.  Neurological:  Negative for dizziness and headaches.  Hematological:  Does not bruise/bleed easily.   Psychiatric/Behavioral:  The patient is not nervous/anxious.   Blood pressure (!) 162/88, pulse 85, temperature 99.1 F (37.3 C), resp. rate 18, height 6\' 3"  (1.905 m), weight 115.7 kg, SpO2 93 %. Physical Exam Constitutional:      General: He is not in acute distress.    Appearance: He is well-developed. He is not diaphoretic.  HENT:     Head: Normocephalic and atraumatic.  Eyes:     General: No scleral icterus.       Right eye: No discharge.        Left eye: No discharge.     Conjunctiva/sclera: Conjunctivae normal.  Cardiovascular:     Rate and Rhythm: Normal rate and regular rhythm.  Pulmonary:  Effort: Pulmonary effort is normal. No respiratory distress.  Musculoskeletal:     Cervical back: Normal range of motion.     Comments: LLE No traumatic wounds, ecchymosis, or rash  Ankle, distal lower leg erythematous, mod TTP  Large ankle effusion  Knee stable to varus/ valgus and anterior/posterior stress  Sens DPN, SPN, TN intact  Motor EHL, ext, flex, evers 5/5  DP 0, PT 0 (likely 2/2 edema), 4+ edema  Skin:    General: Skin is warm and dry.  Neurological:     Mental Status: He is alert.  Psychiatric:        Mood and Affect: Mood normal.        Behavior: Behavior normal.    Assessment/Plan: Left ankle cellulitis -- Likely has septic joint given large effusion and 35k WBC but has good motion for a septic joint so not absolute. Given lack of organisms in GS will hold off on I&D unless culture grows something. Given presence of hardware in ankle, bony changes, and long-standing post-traumatic arthritis pt would likely benefit most from BKA to eliminate pain, improve function, and lessen chance of repeat septic complications. Pt is not ready for that yet and would like to treat this medically. He understands potential complications including likely need for recurrent hospitalizations and risk of severe illness or death from sepsis. F/u with Dr. Lucia Gaskins 2-3 weeks after  discharge.    Lisette Abu, PA-C Orthopedic Surgery [phone] 07/24/2020, 9:52 AM

## 2020-07-25 DIAGNOSIS — C61 Malignant neoplasm of prostate: Secondary | ICD-10-CM | POA: Diagnosis not present

## 2020-07-25 DIAGNOSIS — I10 Essential (primary) hypertension: Secondary | ICD-10-CM | POA: Diagnosis not present

## 2020-07-25 DIAGNOSIS — M009 Pyogenic arthritis, unspecified: Secondary | ICD-10-CM | POA: Diagnosis not present

## 2020-07-25 LAB — CBC WITH DIFFERENTIAL/PLATELET
Abs Immature Granulocytes: 0.05 10*3/uL (ref 0.00–0.07)
Basophils Absolute: 0 10*3/uL (ref 0.0–0.1)
Basophils Relative: 0 %
Eosinophils Absolute: 0.3 10*3/uL (ref 0.0–0.5)
Eosinophils Relative: 4 %
HCT: 29.4 % — ABNORMAL LOW (ref 39.0–52.0)
Hemoglobin: 9.1 g/dL — ABNORMAL LOW (ref 13.0–17.0)
Immature Granulocytes: 1 %
Lymphocytes Relative: 13 %
Lymphs Abs: 0.9 10*3/uL (ref 0.7–4.0)
MCH: 25.9 pg — ABNORMAL LOW (ref 26.0–34.0)
MCHC: 31 g/dL (ref 30.0–36.0)
MCV: 83.5 fL (ref 80.0–100.0)
Monocytes Absolute: 0.6 10*3/uL (ref 0.1–1.0)
Monocytes Relative: 8 %
Neutro Abs: 5.3 10*3/uL (ref 1.7–7.7)
Neutrophils Relative %: 74 %
Platelets: 304 10*3/uL (ref 150–400)
RBC: 3.52 MIL/uL — ABNORMAL LOW (ref 4.22–5.81)
RDW: 13.2 % (ref 11.5–15.5)
WBC: 7.2 10*3/uL (ref 4.0–10.5)
nRBC: 0 % (ref 0.0–0.2)

## 2020-07-25 LAB — PROTEIN, BODY FLUID (OTHER): Total Protein, Body Fluid Other: 4.8 g/dL

## 2020-07-25 LAB — COMPREHENSIVE METABOLIC PANEL
ALT: 14 U/L (ref 0–44)
AST: 12 U/L — ABNORMAL LOW (ref 15–41)
Albumin: 1.9 g/dL — ABNORMAL LOW (ref 3.5–5.0)
Alkaline Phosphatase: 48 U/L (ref 38–126)
Anion gap: 8 (ref 5–15)
BUN: 11 mg/dL (ref 6–20)
CO2: 34 mmol/L — ABNORMAL HIGH (ref 22–32)
Calcium: 8.3 mg/dL — ABNORMAL LOW (ref 8.9–10.3)
Chloride: 96 mmol/L — ABNORMAL LOW (ref 98–111)
Creatinine, Ser: 0.64 mg/dL (ref 0.61–1.24)
GFR, Estimated: >60 mL/min (ref >60)
Glucose, Bld: 100 mg/dL — ABNORMAL HIGH (ref 70–99)
Potassium: 3.3 mmol/L — ABNORMAL LOW (ref 3.5–5.1)
Sodium: 138 mmol/L (ref 135–145)
Total Bilirubin: 0.3 mg/dL (ref 0.3–1.2)
Total Protein: 5.7 g/dL — ABNORMAL LOW (ref 6.5–8.1)

## 2020-07-25 LAB — GLUCOSE, BODY FLUID OTHER: Glucose, Body Fluid Other: <2 mg/dL

## 2020-07-25 LAB — GLUCOSE, CAPILLARY
Glucose-Capillary: 105 mg/dL — ABNORMAL HIGH (ref 70–99)
Glucose-Capillary: 104 mg/dL — ABNORMAL HIGH (ref 70–99)
Glucose-Capillary: 106 mg/dL — ABNORMAL HIGH (ref 70–99)

## 2020-07-25 MED ORDER — ENSURE ENLIVE PO LIQD: 237.0000 mL | ORAL | Status: DC

## 2020-07-25 MED ORDER — PROSOURCE PLUS PO LIQD
30.0000 mL | Freq: Three times a day (TID) | ORAL | Status: DC
  Administered 2020-07-25 – 2020-07-30 (×14): 30 mL via ORAL
  Filled 2020-07-25 (×13): qty 30

## 2020-07-25 MED ORDER — ALLOPURINOL 300 MG PO TABS
300.0000 mg | ORAL_TABLET | Freq: Every day | ORAL | Status: DC
  Administered 2020-07-25 – 2020-07-28 (×2): 300 mg via ORAL
  Filled 2020-07-25 (×4): qty 1

## 2020-07-25 MED ORDER — ADULT MULTIVITAMIN W/MINERALS CH
1.0000 | ORAL_TABLET | Freq: Every day | ORAL | Status: DC
  Administered 2020-07-26 – 2020-07-30 (×5): 1 via ORAL
  Filled 2020-07-25 (×5): qty 1

## 2020-07-25 MED ORDER — POTASSIUM CHLORIDE IN NACL 20-0.9 MEQ/L-% IV SOLN
INTRAVENOUS | Status: DC
  Filled 2020-07-25 (×5): qty 1000

## 2020-07-25 MED ORDER — JUVEN PO PACK
1.0000 | PACK | Freq: Two times a day (BID) | ORAL | Status: DC
  Filled 2020-07-25 (×2): qty 1

## 2020-07-25 MED ORDER — ENOXAPARIN SODIUM 40 MG/0.4ML IJ SOSY
40.0000 mg | PREFILLED_SYRINGE | Freq: Every day | INTRAMUSCULAR | Status: DC
  Administered 2020-07-25 – 2020-07-30 (×6): 40 mg via SUBCUTANEOUS
  Filled 2020-07-25 (×6): qty 0.4

## 2020-07-25 MED ORDER — POTASSIUM CHLORIDE CRYS ER 20 MEQ PO TBCR: 40.0000 meq | EXTENDED_RELEASE_TABLET | Freq: Once | ORAL | Status: DC

## 2020-07-25 NOTE — Evaluation (Signed)
Physical Therapy Evaluation Patient Details Name: Andrew Haley MRN: [id number] DOB: 10/25/1965 Today's Date: 07/25/2020   History of Present Illness  Pt is a 55 y.o. male who presented 6/13 with L ankle pain and swelling. Of note, pt was admitted 6/8 - 6/9 at [facility] for cellulitis and was discharged home on antibiotic. CT findings of joint effusion and bony erosions of the distal tibia, talus, dorsal calcaneus concerning for septic arthritis. PMH: arthritis, obesity, HTN, prostate cancer, and sleep apnea.   Clinical Impression  Pt presents with condition above and deficits mentioned below, see PT Problem List. PTA, he was living alone in a mobile home with 2 STE without handrails. Pt was independent, working with horses and baling hay. Pt reports he believes he may live somewhere else or have a ramp installed upon d/c to allow for easier and safer home access. Currently, pt demonstrates severe L leg pain, edema, ROM deficits, and weakness that impacts his mobility. However, despite these deficits and his pain, the pt is very motivated to participate, improve, and return to his PLOF. He was able to tolerate placing ~10% of his weight through his L leg to ambulate up to ~10 ft with a RW and min guard assist this date. For this pt's job, he will need to be able to ambulate long distances. Pt is needing minA to transfer to stand at this time. Considering pt's PLOF and high motivation level to improve, he would greatly benefit from intensive therapy in the CIR setting prior to return home. Will continue to follow acutely.    Follow Up Recommendations CIR;Supervision for mobility/OOB    Equipment Recommendations  Rolling walker with 5" wheels;3in1 (PT)    Recommendations for Other Services Rehab consult     Precautions / Restrictions Precautions Precautions: Fall;Other (comment) Precaution Comments: LLE swelling, severe pain Restrictions Weight Bearing Restrictions: No       Mobility  Bed Mobility Overal bed mobility: Independent             General bed mobility comments: Pt able to transition sit > supine with bed flat.    Transfers Overall transfer level: Needs assistance Equipment used: Rolling walker (2 wheeled) Transfers: Sit to/from [facility] Sit to Stand: Min assist Stand pivot transfers: Min assist       General transfer comment: MinA to power up and steady with sit > stand from recliner > RW.  Ambulation/Gait Ambulation/Gait assistance: Min guard Gait Distance (Feet): 10 Feet Assistive device: Rolling walker (2 wheeled) Gait Pattern/deviations: Step-to pattern;Decreased stride length;Decreased stance time - left;Decreased weight shift to left;Decreased dorsiflexion - left;Trunk flexed;Antalgic Gait velocity: reduced Gait velocity interpretation: <1.31 ft/sec, indicative of household ambulator General Gait Details: Pt with antalgic gait pattern, demonstrating heavy use of his UEs on the RW with decreased L weight shift and stance time. Pt placing ~10% of his weight through his L lower extrmity, monitored by placing therapist's ahnd under pt's foot. Min guard for safety.  Stairs            Wheelchair Mobility    Modified Rankin (Stroke Patients Only)       Balance Overall balance assessment: Needs assistance Sitting-balance support: No upper extremity supported;Feet supported Sitting balance-[name]: Fair Sitting balance - Comments: Static sitting EOB no LOB, supervision for safety.   Standing balance support: Bilateral upper extremity supported;During functional activity Standing balance-[name]: Poor Standing balance comment: heavily reliant on RW  Pertinent Vitals/Pain Pain Assessment: 0-10 Pain Score: 8  Pain Location: LLE ankle/foot Pain Descriptors / Indicators: Discomfort;Sharp;Shooting Pain Intervention(s): Limited activity within patient's  tolerance;Monitored during session;Repositioned    Home Living Family/patient expects to be discharged to:: Private residence Living Arrangements: Alone Available Help at Discharge: Friend(s);Available PRN/intermittently Type of Home: Mobile home Home Access: Stairs to enter Entrance Stairs-Rails: None Entrance Stairs-Number of Steps: 2 Home Layout: One level Home Equipment: Shower seat Additional Comments: Pt reporting he may be able to get someone to get/make him a ramp or he may live somewhere else upon d/c for easier entrance/exit.    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     Hand Dominance   Dominant Hand: Right    Extremity/Trunk Assessment   Upper Extremity Assessment Upper Extremity Assessment: Generalized weakness    Lower Extremity Assessment Lower Extremity Assessment: Generalized weakness;LLE deficits/detail LLE Deficits / Details: L lower leg pitting edema of 2+; generalized weakness; rests with hip externally rotated; reports decreased sensation LLE Sensation: decreased light touch LLE Coordination: decreased fine motor;decreased gross motor    Cervical / Trunk Assessment Cervical / Trunk Assessment: Normal  Communication   Communication: No difficulties  Cognition Arousal/Alertness: Awake/alert Behavior During Therapy: WFL for tasks assessed/performed Overall Cognitive Status: Within Functional Limits for tasks assessed                                        General Comments General comments (skin integrity, edema, etc.): Educated pt to elevate L lower extrmity, float heel, and perform ankle pumps to encourage edema management    Exercises     Assessment/Plan    PT Assessment Patient needs continued PT services  PT Problem List Decreased strength;Decreased range of motion;Decreased activity tolerance;Decreased balance;Decreased mobility;Decreased  coordination;Decreased knowledge of use of DME;Decreased safety awareness;Impaired sensation;Obesity;Decreased skin integrity;Pain       PT Treatment Interventions DME instruction;Gait training;Stair training;Therapeutic activities;Functional mobility training;Therapeutic exercise;Balance training;Neuromuscular re-education;Patient/family education;Wheelchair mobility training    PT Goals (Current goals can be found in the Care Plan section)  Acute Rehab PT Goals Patient Stated Goal: to go home and not get an amputation PT Goal Formulation: With patient Time For Goal Achievement: 08/08/20 Potential to Achieve Goals: Good    Frequency Min 3X/week   Barriers to discharge Other (comment) 2 STE without rails with pt living alone    Co-evaluation               AM-PAC PT "6 Clicks" Mobility  Outcome Measure Help needed turning from your back to your side while in a flat bed without using bedrails?: None Help needed moving from lying on your back to sitting on the side of a flat bed without using bedrails?: None Help needed moving to and from a bed to a chair (including a wheelchair)?: A Little Help needed standing up from a chair using your arms (e.g., wheelchair or bedside chair)?: A Little Help needed to walk in hospital room?: A Little Help needed climbing 3-5 steps with a railing? : A Lot 6 Click Score: 19    End of Session Equipment Utilized During Treatment: Gait belt Activity Tolerance: Patient limited by pain Patient left: in bed;with call bell/phone within reach;with bed alarm set Nurse Communication: Mobility status PT Visit Diagnosis: Unsteadiness on feet (R26.81);Other abnormalities  of gait and mobility (R26.89);Muscle weakness (generalized) (M62.81);Difficulty in walking, not elsewhere classified (R26.2);Pain Pain - Right/Left: Left Pain - part of body: Leg    Time: 5681-2751 PT Time Calculation (min) (ACUTE ONLY): 21 min   Charges:   PT Evaluation $PT Eval  Moderate Complexity: 1 Mod          Moishe Spice, PT, DPT Acute Rehabilitation Services  Pager: [phone] Office: [phone]   Orvan Falconer 07/25/2020, 4:53 PM

## 2020-07-25 NOTE — Progress Notes (Addendum)
PROGRESS NOTE  Andrew Haley  HRC:[id number] DOB: 05/07/1965 DOA: 07/23/2020 PCP: Redmond School, MD   Brief Narrative: Andrew Haley is a 55 y.o. male with a history of chronic opioid-dependent low back pain, post-traumatic severe left ankle arthritis, gout, obesity, OSA on CPAP, localized prostate CA s/p prostatectomy who was admitted at [facility] 6/8 - 6/9 treated for LLE cellulitis discharged on doxycycline and returned to [facility] 6/13 with left ankle pain and swelling, tachycardia, leukocytosis (WBC 12.2k), and low grade fever with CT findings of joint effusion and bony erosions of the distal tibia, talus, dorsal calcaneus concerning for for septic arthritis. IV antibiotics were started and the patient was admitted with orthopedics consulting. Arthrocentesis revealed turbid fluid with 35k WBCs (99% PMNs), negative gram stain.   Assessment & Plan: Principal Problem:   Septic arthritis ([place]) Active Problems:   Prostate cancer ([place])   Essential hypertension  Sepsis due to LLE cellulitis with concern for septic ankle arthritis in patient with posttraumatic left ankle arthritis w/hardware: WBC normalized, LA reassuring.  - Continue vancomycin, zosyn, tailor to culture data. Blood cultures 6/13 NGTD. Arthrocentesis culture NGTD.  - Pt declining operative management, currently I&D with possible BKA is recommended.  - Continue pain control with primarily po oxycodone which is home medication. Pt is opioid tolerant.  - History of gout, no crystals in synovial fluid. Will continue allopurinol. - PT/OT  OSA:  - Continue CPAP.  - Pt remains somewhat drowsy but is appropriate and rousable, no indication for ABG.  Anemia: Appears chronic but slightly worsening.  - Recheck CBC and add anemia panel in AM   Obesity: BMI 31.   Hypoalbuminemia:  - Dietitian consulted  Hypokalemia:  - Supplement and monitor. check Mg in AM. Continuing IVF given suspicion for increased insensible losses.   History of  prostate CA: s/p prostatectomy.   DVT prophylaxis: Lovenox Code Status: Full Family Communication: None at bedside Disposition Plan:  Status is: Inpatient  Remains inpatient appropriate because:Ongoing active pain requiring inpatient pain management, Ongoing diagnostic testing needed not appropriate for outpatient work up, and Inpatient level of care appropriate due to severity of illness  Dispo: The patient is from: Home              Anticipated d/c is to:  TBD               Patient currently is not medically stable to d/c.   Difficult to place patient No  Consultants:  Orthopedics  Procedures:  None  Antimicrobials: Vancomycin, zosyn   Subjective: Reports significant improvement in swelling of left leg, very perseverative on the fact that he's not having surgery. Appears diaphoretic but denies fever/chills. Has not beared any weight yet.  Objective: Vitals:   07/25/20 0255 07/25/20 0320 07/25/20 0350 07/25/20 0737  BP: (!) 150/88 (!) 145/82 135/75 (!) 154/87  Pulse: 79 80 83 75  Resp: 20 18 20  (!) 21  Temp:   98.4 F (36.9 C) (!) 97.5 F (36.4 C)  TempSrc:   Axillary Oral  SpO2: 99% 99% 96% 99%  Weight:      Height:        Intake/Output Summary (Last 24 hours) at 07/25/2020 0917 Last data filed at 07/25/2020 0200 Gross per 24 hour  Intake 1257.02 ml  Output 1100 ml  Net 157.02 ml   Filed Weights   07/23/20 1747 07/24/20 2015  Weight: 115.7 kg 114.2 kg    Gen: 55 y.o. male in no distress  Pulm:  Non-labored breathing room air. Clear to auscultation bilaterally.  CV: Regular rate and rhythm. No murmur, rub, or gallop. No JVD. GI: Abdomen soft, non-tender, non-distended, with normoactive bowel sounds. No organomegaly or masses felt. Ext: Warm, no deformities Skin: Swollen, tender, warm erythema extending throughout the left foot, ankle, and most of the leg below the knee. No exudate noted.  Neuro: Alert and oriented. No focal neurological deficits. Psych:  Judgement and insight appear normal. Mood & affect appropriate.   Data Reviewed: I have personally reviewed following labs and imaging studies  CBC: Recent Labs  Lab 07/18/20 1617 07/19/20 0531 07/23/20 1847 07/24/20 0512 07/25/20 0231  WBC 11.1* 9.9 12.2* 10.9* 7.2  NEUTROABS 9.0* 8.4* 10.3*  --  5.3  HGB 11.2* 10.1* 10.5* 9.4* 9.1*  HCT 36.8* 32.7* 33.8* 30.4* 29.4*  MCV 85.6 86.1 82.4 83.3 83.5  PLT 323 245 428* 336 992   Basic Metabolic Panel: Recent Labs  Lab 07/18/20 1617 07/23/20 1847 07/24/20 0512 07/25/20 0231  NA 136 132* 134* 138  K 3.7 2.9* 3.1* 3.3*  CL 100 89* 93* 96*  CO2 28 33* 30 34*  GLUCOSE 117* 105* 127* 100*  BUN 10 15 13 11   CREATININE 0.51* 0.82 0.76 0.64  CALCIUM 8.3* 9.0 8.7* 8.3*   GFR: Estimated Creatinine Clearance: 140.6 mL/min (by C-G formula based on SCr of 0.64 mg/dL). Liver Function Tests: Recent Labs  Lab 07/18/20 1617 07/23/20 1847 07/25/20 0231  AST 17 16 12*  ALT 14 15 14   ALKPHOS 78 81 48  BILITOT 0.8 0.5 0.3  PROT 7.2 8.4* 5.7*  ALBUMIN 3.6 2.7* 1.9*   No results for input(s): LIPASE, AMYLASE in the last 168 hours. No results for input(s): AMMONIA in the last 168 hours. Coagulation Profile: Recent Labs  Lab 07/23/20 1847  INR 1.1   Cardiac Enzymes: No results for input(s): CKTOTAL, CKMB, CKMBINDEX, TROPONINI in the last 168 hours. BNP (last 3 results) No results for input(s): PROBNP in the last 8760 hours. HbA1C: No results for input(s): HGBA1C in the last 72 hours. CBG: Recent Labs  Lab 07/24/20 0618 07/24/20 1212 07/24/20 2041 07/24/20 2323  GLUCAP 114* 115* 139* 143*   Lipid Profile: No results for input(s): CHOL, HDL, LDLCALC, TRIG, CHOLHDL, LDLDIRECT in the last 72 hours. Thyroid Function Tests: No results for input(s): TSH, T4TOTAL, FREET4, T3FREE, THYROIDAB in the last 72 hours. Anemia Panel: No results for input(s): VITAMINB12, FOLATE, FERRITIN, TIBC, IRON, RETICCTPCT in the last 72  hours. Urine analysis:    Component Value Date/Time   COLORURINE YELLOW 07/23/2020 [place] 07/23/2020 1749   LABSPEC 1.013 07/23/2020 1749   PHURINE 5.0 07/23/2020 1749   GLUCOSEU NEGATIVE 07/23/2020 1749   HGBUR NEGATIVE 07/23/2020 1749   BILIRUBINUR NEGATIVE 07/23/2020 1749   KETONESUR NEGATIVE 07/23/2020 1749   PROTEINUR NEGATIVE 07/23/2020 1749   UROBILINOGEN 1.0 05/25/2014 0822   NITRITE NEGATIVE 07/23/2020 1749   LEUKOCYTESUR NEGATIVE 07/23/2020 1749   Recent Results (from the past 240 hour(s))  Resp Panel by RT-PCR (Flu A&B, Covid) Nasopharyngeal Swab     Status: None   Collection Time: 07/18/20  5:56 PM   Specimen: Nasopharyngeal Swab; Nasopharyngeal(NP) swabs in vial transport medium  Result Value Ref Range Status   SARS Coronavirus 2 by RT PCR NEGATIVE NEGATIVE Final    Comment: (NOTE) SARS-CoV-2 target nucleic acids are NOT DETECTED.  The SARS-CoV-2 RNA is generally detectable in upper respiratory specimens during the acute phase of infection. The lowest  concentration of SARS-CoV-2 viral copies this assay can detect is 138 copies/mL. A negative result does not preclude SARS-Cov-2 infection and should not be used as the sole basis for treatment or other patient management decisions. A negative result may occur with  improper specimen collection/handling, submission of specimen other than nasopharyngeal swab, presence of viral mutation(s) within the areas targeted by this assay, and inadequate number of viral copies(<138 copies/mL). A negative result must be combined with clinical observations, patient history, and epidemiological information. The expected result is Negative.  Fact Sheet for Patients:  EntrepreneurPulse.com.au  Fact Sheet for Healthcare Providers:  IncredibleEmployment.be  This test is no t yet approved or cleared by the [place] FDA and  has been authorized for detection and/or diagnosis  of SARS-CoV-2 by FDA under an Emergency Use Authorization (EUA). This EUA will remain  in effect (meaning this test can be used) for the duration of the COVID-19 declaration under Section 564(b)(1) of the Act, 21 U.S.C.section 360bbb-3(b)(1), unless the authorization is terminated  or revoked sooner.       Influenza A by PCR NEGATIVE NEGATIVE Final   Influenza B by PCR NEGATIVE NEGATIVE Final    Comment: (NOTE) The Xpert Xpress SARS-CoV-2/FLU/RSV plus assay is intended as an aid in the diagnosis of influenza from Nasopharyngeal swab specimens and should not be used as a sole basis for treatment. Nasal washings and aspirates are unacceptable for Xpert Xpress SARS-CoV-2/FLU/RSV testing.  Fact Sheet for Patients: EntrepreneurPulse.com.au  Fact Sheet for Healthcare Providers: IncredibleEmployment.be  This test is not yet approved or cleared by the [place] FDA and has been authorized for detection and/or diagnosis of SARS-CoV-2 by FDA under an Emergency Use Authorization (EUA). This EUA will remain in effect (meaning this test can be used) for the duration of the COVID-19 declaration under Section 564(b)(1) of the Act, 21 U.S.C. section 360bbb-3(b)(1), unless the authorization is terminated or revoked.  Performed at [facility], [address] [place], [place] [address]   Culture, blood (Routine x 2)     Status: None (Preliminary result)   Collection Time: 07/23/20  6:19 PM   Specimen: BLOOD  Result Value Ref Range Status   Specimen Description BLOOD RIGHT ANTECUBITAL  Final   Special Requests   Final    BOTTLES DRAWN AEROBIC ONLY Blood Culture adequate volume   Culture   Final    NO GROWTH 2 DAYS Performed at [facility], [address], [place], [place] [address]    Report Status PENDING  Incomplete  Culture, blood (Routine x 2)     Status: None (Preliminary result)   Collection Time: 07/23/20  6:19 PM   Specimen: BLOOD   Result Value Ref Range Status   Specimen Description BLOOD LEFT ANTECUBITAL  Final   Special Requests   Final    BOTTLES DRAWN AEROBIC ONLY Blood Culture results may not be optimal due to an inadequate volume of blood received in culture bottles   Culture   Final    NO GROWTH 2 DAYS Performed at [facility], [place] [address], [place], [place] [address]    Report Status PENDING  Incomplete  Resp Panel by RT-PCR (Flu A&B, Covid) Nasopharyngeal Swab     Status: None   Collection Time: 07/23/20 10:44 PM   Specimen: Nasopharyngeal Swab; Nasopharyngeal(NP) swabs in vial transport medium  Result Value Ref Range Status   SARS Coronavirus 2 by RT PCR NEGATIVE NEGATIVE Final    Comment: (NOTE) SARS-CoV-2 target  nucleic acids are NOT DETECTED.  The SARS-CoV-2 RNA is generally detectable in upper respiratory specimens during the acute phase of infection. The lowest concentration of SARS-CoV-2 viral copies this assay can detect is 138 copies/mL. A negative result does not preclude SARS-Cov-2 infection and should not be used as the sole basis for treatment or other patient management decisions. A negative result may occur with  improper specimen collection/handling, submission of specimen other than nasopharyngeal swab, presence of viral mutation(s) within the areas targeted by this assay, and inadequate number of viral copies(<138 copies/mL). A negative result must be combined with clinical observations, patient history, and epidemiological information. The expected result is Negative.  Fact Sheet for Patients:  EntrepreneurPulse.com.au  Fact Sheet for Healthcare Providers:  IncredibleEmployment.be  This test is no t yet approved or cleared by the [place] FDA and  has been authorized for detection and/or diagnosis of SARS-CoV-2 by FDA under an Emergency Use Authorization (EUA). This EUA will remain  in effect (meaning this test can be used)  for the duration of the COVID-19 declaration under Section 564(b)(1) of the Act, 21 U.S.C.section 360bbb-3(b)(1), unless the authorization is terminated  or revoked sooner.       Influenza A by PCR NEGATIVE NEGATIVE Final   Influenza B by PCR NEGATIVE NEGATIVE Final    Comment: (NOTE) The Xpert Xpress SARS-CoV-2/FLU/RSV plus assay is intended as an aid in the diagnosis of influenza from Nasopharyngeal swab specimens and should not be used as a sole basis for treatment. Nasal washings and aspirates are unacceptable for Xpert Xpress SARS-CoV-2/FLU/RSV testing.  Fact Sheet for Patients: EntrepreneurPulse.com.au  Fact Sheet for Healthcare Providers: IncredibleEmployment.be  This test is not yet approved or cleared by the [place] FDA and has been authorized for detection and/or diagnosis of SARS-CoV-2 by FDA under an Emergency Use Authorization (EUA). This EUA will remain in effect (meaning this test can be used) for the duration of the COVID-19 declaration under Section 564(b)(1) of the Act, 21 U.S.C. section 360bbb-3(b)(1), unless the authorization is terminated or revoked.  Performed at [facility], [place] [address], [place], [place] [address]   Body fluid culture w Gram Stain     Status: None (Preliminary result)   Collection Time: 07/24/20 12:27 AM   Specimen: Body Fluid  Result Value Ref Range Status   Specimen Description FLUID ANKLE  Final   Special Requests NONE  Final   Gram Stain   Final    WBC PRESENT, PREDOMINANTLY MONONUCLEAR NO ORGANISMS SEEN CYTOSPIN SMEAR    Culture   Final    NO GROWTH 1 DAY Performed at [facility], [address] [place], [place] [address]    Report Status PENDING  Incomplete      Radiology Studies: DG Chest 1 View  Result Date: 07/23/2020 CLINICAL DATA:  Swelling and redness of the foot EXAM: CHEST  1 VIEW COMPARISON:  11/28/2015 FINDINGS: The heart size and mediastinal  contours are within normal limits. Both lungs are clear. The visualized skeletal structures are unremarkable. IMPRESSION: No active disease. Electronically Signed   By: Donavan Foil M.D.   On: 07/23/2020 19:52   CT EXTREMITY LOWER LEFT WO CONTRAST  Result Date: 07/23/2020 CLINICAL DATA:  Left leg redness and swelling, leukocytosis EXAM: CT OF THE LOWER LEFT EXTREMITY WITHOUT CONTRAST TECHNIQUE: Multidetector CT imaging of the lower left extremity was performed according to the standard protocol. Field of view is from the left hip through the left foot. COMPARISON:  X-ray 07/18/2020, CT 02/29/2020 FINDINGS: Bones/Joint/Cartilage No acute fracture the left femur. Hip joint is intact with mild arthropathy. No appreciable hip joint effusion. Status post left total knee arthroplasty. Extensive metallic streak artifact related to patient's hardware degrades evaluation of the adjacent structures. Within this limitation. There is no evidence of periprosthetic fracture or hardware loosening. Small knee joint effusion with synovial thickening. Severe degenerative changes of the tibiotalar joint with widening of the joint space and progressive erosions along both sides of the joint as well as throughout the medial and lateral malleoli. Additionally, there are erosive changes associated with the subtalar joint and along the posterior aspect of the calcaneus. Suspected erosions along the dorsal aspect of the navicular bone. No tibiotalar joint dislocation. Prior fibular ORIF hardware. Degenerative changes of the forefoot, most pronounced at the first MTP joint where there is hallux valgus deformity. Large complex tibiotalar joint effusion containing debris. Ligaments Suboptimally assessed by CT. Muscles and Tendons Suspect large peroneal tenosynovial fluid collection (series 11, image 113). Fatty infiltration of the calf musculature. Soft tissues Circumferential subcutaneous edema throughout the lower leg. Marked diffuse  soft tissue swelling and edema of the ankle and dorsal foot. No soft tissue gas. IMPRESSION: 1. Findings suggestive of septic arthritis of the left ankle with large complex tibiotalar joint effusion containing debris. New erosions centered at the subtalar and talonavicular joints also suspicious for septic arthritis. Orthopedic surgery consultation recommended. 2. Suspect large peroneal tenosynovial fluid collection which could be septic or reactive. 3. Circumferential subcutaneous edema throughout the lower leg, ankle, and dorsal foot, which may reflect cellulitis. No soft tissue gas. 4. Status post left total knee arthroplasty without evidence of periprosthetic fracture or hardware loosening. Electronically Signed   By: Davina Poke D.O.   On: 07/23/2020 19:57    Scheduled Meds: Continuous Infusions:  piperacillin-tazobactam (ZOSYN)  IV 3.375 g (07/25/20 0557)   vancomycin 1,500 mg (07/25/20 0035)     LOS: 2 days   Time spent: 35 minutes.  Patrecia Pour, MD [facility] [email or web address] 07/25/2020, 9:17 AM

## 2020-07-25 NOTE — Progress Notes (Signed)
S: Visited with Mr. Bogacz this morning. He feels as if his leg is improving daily. He is hopeful that he can bare weight on it to transfer to the chair or restroom.  O: Leg appears to be less erythematous, still with warm temperature to the touch and pain with palpation over the mid portion of the shin. Foot remains quite swollen, red, hot , and skin has a shiny texture. Feeling intact in toes. Capable of some limited ankle ROM and toe ROM actively.   A: Patient is Gram stain negative and seems to be improving with antibiotics daily.  P: Continue current course. We will continue to follow up on culture results.  If cultures return positive we will have discussion about surgery.  Okay from orthopedic standpoint for outpatient follow-up if deemed suitable per hospitalist team.

## 2020-07-25 NOTE — Evaluation (Addendum)
Occupational Therapy Evaluation Patient Details Name: Andrew Haley MRN: [id number] DOB: 11-03-1965 Today's Date: 07/25/2020    History of Present Illness Pt is a 55 y.o. male who presented 6/13 with L ankle pain and swelling. Of note, pt was admitted 6/8 - 6/9 at [facility] for cellulitis and was discharged home on antibiotic. CT findings of joint effusion and bony erosions of the distal tibia, talus, dorsal calcaneus concerning for septic arthritis. PMH: arthritis, obesity, HTN, prostate cancer, and sleep apnea.   Clinical Impression   Pt PTA; Pt living alone, reports independence, and living in camper while his house is being built. Pt currently, limited by decreased strength, decreased ability to care for self and decreased activity tolerance. Pt's LLE swelling and increased pain throughout session; pt not bearing any wt on LLE at this time and heaviliy reliant on RW. Pt limited to pivoting from recliner <-> BSC for BM. Pt minA for task. Pt set-upA to maxA for ADL. Pt would greatly benefit from continued OT skilled services for ADL, mobility and safety in CIR setting as pt was completely independent prior and wants to walk. OT following acutely.     Follow Up Recommendations  CIR    Equipment Recommendations  3 in 1 bedside commode    Recommendations for Other Services Rehab consult     Precautions / Restrictions Precautions Precautions: Fall;Other (comment) Precaution Comments: LLE swelling, severe pain Restrictions Weight Bearing Restrictions: No      Mobility Bed Mobility               General bed mobility comments: in recliner pre and post session    Transfers Overall transfer level: Needs assistance Equipment used: Rolling walker (2 wheeled) Transfers: Sit to/from [facility] Sit to Stand: Min assist Stand pivot transfers: Min assist       General transfer comment: stand pivot from recliner <-> BSC    Balance Overall balance  assessment: Needs assistance   Sitting balance-[name]: Fair       Standing balance-[name]: Poor Standing balance comment: heavily reliant on RW                           ADL either performed or assessed with clinical judgement   ADL Overall ADL's : Needs assistance/impaired Eating/Feeding: Set up;Sitting   Grooming: Set up;Sitting   Upper Body Bathing: Set up;Sitting   Lower Body Bathing: Moderate assistance;Sitting/lateral leans;Sit to/from stand;Cueing for safety   Upper Body Dressing : Set up;Sitting   Lower Body Dressing: Moderate assistance;Sitting/lateral leans Lower Body Dressing Details (indicate cue type and reason): pulling underwear down with modA and up with modA Toilet Transfer: Minimal assistance;Stand-pivot;BSC;RW   Toileting- Clothing Manipulation and Hygiene: Moderate assistance;Sitting/lateral lean Toileting - Clothing Manipulation Details (indicate cue type and reason): Pt sitting on BSC and weight shifting to complete task. Pt assisted with task as well as pt performing on his own     Functional mobility during ADLs: Minimal assistance;Rolling walker;Cueing for safety;Cueing for sequencing (stand pivot only; per NT, pt laterally scooted to recliner from bed with drop arm recliner) General ADL Comments: Pt limited by decreased strength, decreased ability to care for self and decreased activity tolerance.     Vision Baseline Vision/History: No visual deficits Patient Visual Report: No change from baseline Vision Assessment?: No apparent visual deficits     Perception     Praxis      Pertinent Vitals/Pain Pain Assessment:  0-10 Pain Score: 8  Pain Location: LLE ankle/foot Pain Descriptors / Indicators: Discomfort;Sharp;Shooting Pain Intervention(s): Monitored during session;Repositioned     Hand Dominance Right   Extremity/Trunk Assessment Upper Extremity Assessment Upper Extremity Assessment: Generalized weakness   Lower  Extremity Assessment Lower Extremity Assessment: Generalized weakness;LLE deficits/detail LLE Deficits / Details: L ankle swelling/pain LLE Coordination: decreased fine motor;decreased gross motor   Cervical / Trunk Assessment Cervical / Trunk Assessment: Normal   Communication Communication Communication: No difficulties   Cognition Arousal/Alertness: Awake/alert Behavior During Therapy: WFL for tasks assessed/performed Overall Cognitive Status: Within Functional Limits for tasks assessed                                     General Comments  O2 >90% on RA; pt;s LLE vey pink/red with increased swelling    Exercises     Shoulder Instructions      Home Living Family/patient expects to be discharged to:: Private residence Living Arrangements: Alone Available Help at Discharge: Friend(s);Available PRN/intermittently Type of Home: Mobile home Home Access: Stairs to enter Entrance Stairs-Number of Steps: 2 Entrance Stairs-Rails: None Home Layout: One level     Bathroom Shower/Tub: Occupational psychologist: Standard     Home Equipment: Shower seat          Prior Functioning/Environment Level of Independence: Independent                 OT Problem List: Decreased activity tolerance;Impaired balance (sitting and/or standing);Decreased safety awareness;Pain;Increased edema;Obesity;Decreased knowledge of use of DME or AE      OT Treatment/Interventions: Self-care/ADL training;Therapeutic exercise;DME and/or AE instruction;Energy conservation;Therapeutic activities;Patient/family education;Balance training    OT Goals(Current goals can be found in the care plan section) Acute Rehab OT Goals Patient Stated Goal: to go home OT Goal Formulation: With patient Time For Goal Achievement: 08/08/20 Potential to Achieve Goals: Good ADL Goals Pt Will Perform Grooming: with supervision;standing Pt Will Perform Lower Body Dressing: with  supervision;sit to/from stand;sitting/lateral leans;with adaptive equipment Pt Will Transfer to Toilet: with supervision;stand pivot transfer;bedside commode Additional ADL Goal #1: Pt will increase to x10 mins of OOB ADL with minguardA with <2 seated rest breaks to increase activity tolerance.  OT Frequency: Min 2X/week   Barriers to D/C: Decreased caregiver support  lives alone       Co-evaluation              AM-PAC OT "6 Clicks" Daily Activity     Outcome Measure Help from another person eating meals?: None Help from another person taking care of personal grooming?: A Little Help from another person toileting, which includes using toliet, bedpan, or urinal?: A Lot Help from another person bathing (including washing, rinsing, drying)?: A Lot Help from another person to put on and taking off regular upper body clothing?: A Little Help from another person to put on and taking off regular lower body clothing?: A Lot 6 Click Score: 16   End of Session Equipment Utilized During Treatment: Rolling walker Nurse Communication: Mobility status;Patient requests pain meds  Activity Tolerance: Patient tolerated treatment well;Patient limited by pain Patient left: in chair;with call bell/phone within reach;with chair alarm set  OT Visit Diagnosis: Unsteadiness on feet (R26.81);Pain                Time: 1400-1430 OT Time Calculation (min): 30 min Charges:  OT General Charges $OT Visit: 1 Visit OT  Evaluation $OT Eval Moderate Complexity: 1 Mod OT Treatments $Self Care/Home Management : 8-22 mins  Jefferey Pica, OTR/L Acute Rehabilitation Services Pager: [phone] Office: [phone]   Tyquasia Pant C 07/25/2020, 3:54 PM

## 2020-07-25 NOTE — Progress Notes (Addendum)
Initial Nutrition Assessment  DOCUMENTATION CODES:  Not applicable  INTERVENTION:  Add 30 ml ProSource Plus po TID, each supplement provides 100 kcal and 15 grams of protein.    Add Ensure Enlive po daily, each supplement provides 350 kcal and 20 grams of protein.  Add 1 packet Juven BID, each packet provides 95 calories, 2.5 grams of protein (collagen), and 9.8 grams of carbohydrate (3 grams sugar); also contains 7 grams of L-arginine and L-glutamine, 300 mg vitamin C, 15 mg vitamin E, 1.2 mcg vitamin B-12, 9.5 mg zinc, 200 mg calcium, and 1.5 g  Calcium Beta-hydroxy-Beta-methylbutyrate to support wound healing   Add MVI with minerals daily.  NUTRITION DIAGNOSIS:  Increased nutrient needs related to wound healing as evidenced by estimated needs.  GOAL:  Patient will meet greater than or equal to 90% of their needs  MONITOR:  PO intake, Supplement acceptance, Labs, Weight trends, Skin, I & O's  REASON FOR ASSESSMENT:  Consult Assessment of nutrition requirement/status, Wound healing  ASSESSMENT:  55 yo male with a PMH of chronic opioid-dependent low back pain, post-traumatic severe left ankle arthritis, gout, obesity, OSA on CPAP, localized prostate CA s/p prostatectomy who was admitted at [facility] 6/8 - 6/9 treated for LLE cellulitis discharged on doxycycline and returned to [facility] 6/13 with left ankle pain and swelling, tachycardia, leukocytosis (WBC 12.2k), and low grade fever with CT findings of joint effusion and bony erosions of the distal tibia, talus, dorsal calcaneus concerning for for septic arthritis.  Albumin is not an indicator of nutritional status, but rather an indicator of morbidity and mortality, and recovery from acute and chronic illness. Albumin has a half-life of 21 days and is strongly affected by stress response and inflammatory process, therefore, do not expect to see an improvement in this lab value during acute hospitalization. When a patient presents with low  albumin, it is likely skewed due to the acute inflammatory response.   Unless it is suspected that patient had poor PO intake or malnutrition prior to admission, then RD should not be consulted solely for low albumin. Note that low albumin is no longer used to diagnose malnutrition; [place] uses the new malnutrition guidelines published by the American Society for Parenteral and Enteral Nutrition (A.S.P.E.N.) and the Academy of Nutrition and Dietetics (AND).     Pt did not answer phone to obtain pt history. RD working remotely. Per Epic, pt ate 100% of lunch today and breakfast today was undocumented. Pt has no recent weight history in Epic or Care Everywhere.  For wound healing, recommend adding ProSource Plus TID, Juven BID, MVI with minerals, and Ensure Enlive daily.  Medications: reviewed; NaCl with KCl 20 mEq @ 100 ml/hr, Zosyn TID via IV, vancomycin BID via IV, Dilaudid PRN (given once today), oxycodone PRN (given twice today)  Labs: reviewed; K 3.3 (L), CBG 105-143 (H)  NUTRITION - FOCUSED PHYSICAL EXAM: RD working remotely - unable to complete  Diet Order:   Diet Order             Diet Heart Room service appropriate? Yes; Fluid consistency: Thin  Diet effective now                  EDUCATION NEEDS:  Not appropriate for education at this time  Skin:  Skin Assessment: Skin Integrity Issues: Skin Integrity Issues:: Other (Comment) Other: Cellulitis on lower posterior buttocks, L ankle, L foot, and L leg  Last BM:  PTA/unknown  Height:  Ht Readings from Last  1 Encounters:  07/23/20 6\' 3"  (1.905 m)   Weight:  Wt Readings from Last 1 Encounters:  07/24/20 114.2 kg   Ideal Body Weight:  89.1 kg  BMI:  Body mass index is 31.47 kg/m.  Estimated Nutritional Needs:  Kcal:  2000-2200 Protein:  130-145 grams Fluid:  >2 L  Derrel Nip, RD, LDN Registered Dietitian I After-Hours/Weekend Pager # in [place]

## 2020-07-26 DIAGNOSIS — I10 Essential (primary) hypertension: Secondary | ICD-10-CM | POA: Diagnosis not present

## 2020-07-26 DIAGNOSIS — M009 Pyogenic arthritis, unspecified: Secondary | ICD-10-CM | POA: Diagnosis not present

## 2020-07-26 DIAGNOSIS — E538 Deficiency of other specified B group vitamins: Secondary | ICD-10-CM | POA: Diagnosis not present

## 2020-07-26 DIAGNOSIS — C61 Malignant neoplasm of prostate: Secondary | ICD-10-CM | POA: Diagnosis not present

## 2020-07-26 LAB — CBC
HCT: 29.6 % — ABNORMAL LOW (ref 39.0–52.0)
Hemoglobin: 8.9 g/dL — ABNORMAL LOW (ref 13.0–17.0)
MCH: 25.2 pg — ABNORMAL LOW (ref 26.0–34.0)
MCHC: 30.1 g/dL (ref 30.0–36.0)
MCV: 83.9 fL (ref 80.0–100.0)
Platelets: 358 10*3/uL (ref 150–400)
RBC: 3.53 MIL/uL — ABNORMAL LOW (ref 4.22–5.81)
RDW: 13 % (ref 11.5–15.5)
WBC: 8.6 10*3/uL (ref 4.0–10.5)
nRBC: 0 % (ref 0.0–0.2)

## 2020-07-26 LAB — URINE CULTURE

## 2020-07-26 LAB — BASIC METABOLIC PANEL
Anion gap: 7 (ref 5–15)
BUN: 8 mg/dL (ref 6–20)
CO2: 35 mmol/L — ABNORMAL HIGH (ref 22–32)
Calcium: 8.5 mg/dL — ABNORMAL LOW (ref 8.9–10.3)
Chloride: 96 mmol/L — ABNORMAL LOW (ref 98–111)
Creatinine, Ser: 0.6 mg/dL — ABNORMAL LOW (ref 0.61–1.24)
GFR, Estimated: >60 mL/min (ref >60)
Glucose, Bld: 117 mg/dL — ABNORMAL HIGH (ref 70–99)
Potassium: 3.5 mmol/L (ref 3.5–5.1)
Sodium: 138 mmol/L (ref 135–145)

## 2020-07-26 LAB — VANCOMYCIN, PEAK: Vancomycin Pk: 28 ug/mL — ABNORMAL LOW (ref 30–40)

## 2020-07-26 LAB — IRON AND TIBC
Iron: 17 ug/dL — ABNORMAL LOW (ref 45–182)
Saturation Ratios: 8 % — ABNORMAL LOW (ref 17.9–39.5)
TIBC: 207 ug/dL — ABNORMAL LOW (ref 250–450)
UIBC: 190 ug/dL

## 2020-07-26 LAB — FOLATE: Folate: 9.5 ng/mL (ref >5.9)

## 2020-07-26 LAB — VITAMIN B12: Vitamin B-12: 71 pg/mL — ABNORMAL LOW (ref 180–914)

## 2020-07-26 LAB — FERRITIN: Ferritin: 108 ng/mL (ref 24–336)

## 2020-07-26 LAB — MAGNESIUM: Magnesium: 1.8 mg/dL (ref 1.7–2.4)

## 2020-07-26 LAB — GLUCOSE, CAPILLARY
Glucose-Capillary: 137 mg/dL — ABNORMAL HIGH (ref 70–99)
Glucose-Capillary: 95 mg/dL (ref 70–99)

## 2020-07-26 MED ORDER — WHITE PETROLATUM EX OINT
TOPICAL_OINTMENT | CUTANEOUS | Status: AC
  Filled 2020-07-26: qty 28.35

## 2020-07-26 MED ORDER — CYANOCOBALAMIN 1000 MCG/ML IJ SOLN
1000.0000 ug | INTRAMUSCULAR | Status: DC
  Administered 2020-07-26: 1000 ug via INTRAMUSCULAR
  Filled 2020-07-26: qty 1

## 2020-07-26 MED ORDER — VITAMIN B-12 1000 MCG PO TABS
1000.0000 ug | ORAL_TABLET | Freq: Every day | ORAL | Status: DC
  Administered 2020-07-27 – 2020-07-30 (×4): 1000 ug via ORAL
  Filled 2020-07-26 (×4): qty 1

## 2020-07-26 NOTE — Progress Notes (Signed)
PROGRESS NOTE  ABEM SHADDIX  WUX:[id number] DOB: [birth year] DOA: 07/23/2020 PCP: Redmond School, MD   Brief Narrative: Andrew Haley is a 55 y.o. male with a history of chronic opioid-dependent low back pain, post-traumatic severe left ankle arthritis, gout, obesity, OSA on CPAP, localized prostate CA s/p prostatectomy who was admitted at [facility] 6/8 - 6/9 treated for LLE cellulitis discharged on doxycycline and returned to [facility] 6/13 with left ankle pain and swelling, tachycardia, leukocytosis (WBC 12.2k), and low grade fever with CT findings of joint effusion and bony erosions of the distal tibia, talus, dorsal calcaneus concerning for for septic arthritis. IV antibiotics were started and the patient was admitted with orthopedics consulting. Arthrocentesis revealed turbid fluid with 35k WBCs (99% PMNs), negative gram stain.   Assessment & Plan: Principal Problem:   Septic arthritis ([place]) Active Problems:   Prostate cancer ([place])   Essential hypertension  Sepsis due to LLE cellulitis with concern for septic ankle arthritis in patient with posttraumatic left ankle arthritis w/hardware: WBC normalized, LA reassuring.  - Continue vancomycin, zosyn, tailor to culture data. Blood cultures 6/13 remain NGTD. Arthrocentesis culture from 6/14 NGTD.  - Pt declining operative management. - Continue pain control with primarily po oxycodone which is home medication. Pt is opioid tolerant.  - History of gout, no crystals in synovial fluid. Will continue allopurinol. - PT/OT. Was unable to bear any weight on LLE at time of evaluations. CIR recommended. Will also involve CSW for SNF  OSA:  - Continue CPAP.   Anemia: Appears chronic but slightly worsening.  - Recheck CBC in AM  B12 deficiency: Level is 71.  - Supplement ordered IM to continue po thereafter  Obesity: BMI 31.   Hypoalbuminemia:  - Dietitian consulted  Hypokalemia: Improved with supplementation.  - Continue NS w/88mEq KCl @  100cc/hr  History of prostate CA: s/p prostatectomy.   DVT prophylaxis: Lovenox Code Status: Full Family Communication: None at bedside Disposition Plan:  Status is: Inpatient  Remains inpatient appropriate because:Ongoing active pain requiring inpatient pain management, Ongoing diagnostic testing needed not appropriate for outpatient work up, and Inpatient level of care appropriate due to severity of illness  Dispo: The patient is from: Home              Anticipated d/c is to:  TBD   - CIR consulted.              Patient currently is not medically stable to d/c.   Difficult to place patient No  Consultants:  Orthopedics  Procedures:  None  Antimicrobials: Vancomycin, zosyn   Subjective: Again reports improvement in swelling and pain in left ankle/leg/foot. Pain is still severe, improved with oxycodone. Tmax 53F.   Objective: Vitals:   07/26/20 0328 07/26/20 0335 07/26/20 0736 07/26/20 1200  BP: (!) 191/99 (!) 165/105 (!) 168/89 (!) 160/82  Pulse: 83 83 76 89  Resp:   18 18  Temp: 98.2 F (36.8 C)  98.4 F (36.9 C) 98 F (36.7 C)  TempSrc: Oral   Oral  SpO2: 94% 95% 94% 95%  Weight:      Height:        Intake/Output Summary (Last 24 hours) at 07/26/2020 1342 Last data filed at 07/26/2020 1230 Gross per 24 hour  Intake 3212.1 ml  Output 3800 ml  Net -587.9 ml   Filed Weights   07/23/20 1747 07/24/20 2015  Weight: 115.7 kg 114.2 kg   Gen: 55 y.o. male in no distress Pulm: Nonlabored  breathing room air. Clear. CV: Regular rate and rhythm. No murmur, rub, or gallop. No JVD, no dependent edema. GI: Abdomen soft, non-tender, non-distended, with normoactive bowel sounds.  Ext: Warm, Some ROM at ankle/toes. Very TTP. Skin: Erythema and marked swelling of the left foot, ankle and leg that does appear less intense than previously but remains significant. No other rashes, lesions or ulcers on visualized skin. Neuro: Alert and oriented. No focal neurological  deficits. Psych: Judgement and insight appear fair. Mood euthymic & affect congruent. Behavior is appropriate.    Data Reviewed: I have personally reviewed following labs and imaging studies  CBC: Recent Labs  Lab 07/23/20 1847 07/24/20 0512 07/25/20 0231 07/26/20 0330  WBC 12.2* 10.9* 7.2 8.6  NEUTROABS 10.3*  --  5.3  --   HGB 10.5* 9.4* 9.1* 8.9*  HCT 33.8* 30.4* 29.4* 29.6*  MCV 82.4 83.3 83.5 83.9  PLT 428* 336 304 026   Basic Metabolic Panel: Recent Labs  Lab 07/23/20 1847 07/24/20 0512 07/25/20 0231 07/26/20 0330  NA 132* 134* 138 138  K 2.9* 3.1* 3.3* 3.5  CL 89* 93* 96* 96*  CO2 33* 30 34* 35*  GLUCOSE 105* 127* 100* 117*  BUN 15 13 11 8   CREATININE 0.82 0.76 0.64 0.60*  CALCIUM 9.0 8.7* 8.3* 8.5*  MG  --   --   --  1.8   GFR: Estimated Creatinine Clearance: 140.6 mL/min (A) (by C-G formula based on SCr of 0.6 mg/dL (L)). Liver Function Tests: Recent Labs  Lab 07/23/20 1847 07/25/20 0231  AST 16 12*  ALT 15 14  ALKPHOS 81 48  BILITOT 0.5 0.3  PROT 8.4* 5.7*  ALBUMIN 2.7* 1.9*   No results for input(s): LIPASE, AMYLASE in the last 168 hours. No results for input(s): AMMONIA in the last 168 hours. Coagulation Profile: Recent Labs  Lab 07/23/20 1847  INR 1.1   Cardiac Enzymes: No results for input(s): CKTOTAL, CKMB, CKMBINDEX, TROPONINI in the last 168 hours. BNP (last 3 results) No results for input(s): PROBNP in the last 8760 hours. HbA1C: No results for input(s): HGBA1C in the last 72 hours. CBG: Recent Labs  Lab 07/24/20 2323 07/25/20 1119 07/25/20 1722 07/25/20 2313 07/26/20 1200  GLUCAP 143* 105* 104* 106* 137*   Lipid Profile: No results for input(s): CHOL, HDL, LDLCALC, TRIG, CHOLHDL, LDLDIRECT in the last 72 hours. Thyroid Function Tests: No results for input(s): TSH, T4TOTAL, FREET4, T3FREE, THYROIDAB in the last 72 hours. Anemia Panel: Recent Labs    07/26/20 0330  VITAMINB12 71*  FOLATE 9.5  FERRITIN 108  TIBC 207*   IRON 17*   Urine analysis:    Component Value Date/Time   COLORURINE YELLOW 07/23/2020 1749   APPEARANCEUR CLEAR 07/23/2020 1749   LABSPEC 1.013 07/23/2020 1749   PHURINE 5.0 07/23/2020 1749   GLUCOSEU NEGATIVE 07/23/2020 1749   HGBUR NEGATIVE 07/23/2020 1749   BILIRUBINUR NEGATIVE 07/23/2020 1749   [place] 07/23/2020 1749   PROTEINUR NEGATIVE 07/23/2020 1749   UROBILINOGEN 1.0 05/25/2014 0822   NITRITE NEGATIVE 07/23/2020 1749   LEUKOCYTESUR NEGATIVE 07/23/2020 1749   Recent Results (from the past 240 hour(s))  Resp Panel by RT-PCR (Flu A&B, Covid) Nasopharyngeal Swab     Status: None   Collection Time: 07/18/20  5:56 PM   Specimen: Nasopharyngeal Swab; Nasopharyngeal(NP) swabs in vial transport medium  Result Value Ref Range Status   SARS Coronavirus 2 by RT PCR NEGATIVE NEGATIVE Final    Comment: (NOTE) SARS-CoV-2 target nucleic  acids are NOT DETECTED.  The SARS-CoV-2 RNA is generally detectable in upper respiratory specimens during the acute phase of infection. The lowest concentration of SARS-CoV-2 viral copies this assay can detect is 138 copies/mL. A negative result does not preclude SARS-Cov-2 infection and should not be used as the sole basis for treatment or other patient management decisions. A negative result may occur with  improper specimen collection/handling, submission of specimen other than nasopharyngeal swab, presence of viral mutation(s) within the areas targeted by this assay, and inadequate number of viral copies(<138 copies/mL). A negative result must be combined with clinical observations, patient history, and epidemiological information. The expected result is Negative.  Fact Sheet for Patients:  EntrepreneurPulse.com.au  Fact Sheet for Healthcare Providers:  IncredibleEmployment.be  This test is no t yet approved or cleared by the [place] FDA and  has been authorized for detection and/or  diagnosis of SARS-CoV-2 by FDA under an Emergency Use Authorization (EUA). This EUA will remain  in effect (meaning this test can be used) for the duration of the COVID-19 declaration under Section 564(b)(1) of the Act, 21 U.S.C.section 360bbb-3(b)(1), unless the authorization is terminated  or revoked sooner.       Influenza A by PCR NEGATIVE NEGATIVE Final   Influenza B by PCR NEGATIVE NEGATIVE Final    Comment: (NOTE) The Xpert Xpress SARS-CoV-2/FLU/RSV plus assay is intended as an aid in the diagnosis of influenza from Nasopharyngeal swab specimens and should not be used as a sole basis for treatment. Nasal washings and aspirates are unacceptable for Xpert Xpress SARS-CoV-2/FLU/RSV testing.  Fact Sheet for Patients: EntrepreneurPulse.com.au  Fact Sheet for Healthcare Providers: IncredibleEmployment.be  This test is not yet approved or cleared by the [place] FDA and has been authorized for detection and/or diagnosis of SARS-CoV-2 by FDA under an Emergency Use Authorization (EUA). This EUA will remain in effect (meaning this test can be used) for the duration of the COVID-19 declaration under Section 564(b)(1) of the Act, 21 U.S.C. section 360bbb-3(b)(1), unless the authorization is terminated or revoked.  Performed at [facility], [address] [place], [place] [address]   Urine culture     Status: Abnormal   Collection Time: 07/23/20  5:49 PM   Specimen: In/Out Cath Urine  Result Value Ref Range Status   Specimen Description IN/OUT CATH URINE  Final   Special Requests   Final    NONE Performed at [facility], [place] [address], [place], [place] [address]    Culture MULTIPLE SPECIES PRESENT, SUGGEST RECOLLECTION (A)  Final   Report Status 07/26/2020 FINAL  Final  Culture, blood (Routine x 2)     Status: None (Preliminary result)   Collection Time: 07/23/20  6:19 PM   Specimen: BLOOD  Result Value Ref Range Status    Specimen Description BLOOD RIGHT ANTECUBITAL  Final   Special Requests   Final    BOTTLES DRAWN AEROBIC ONLY Blood Culture adequate volume   Culture   Final    NO GROWTH 3 DAYS Performed at [facility], [place] [address] [place], [place] [address]    Report Status PENDING  Incomplete  Culture, blood (Routine x 2)     Status: None (Preliminary result)   Collection Time: 07/23/20  6:19 PM   Specimen: BLOOD  Result Value Ref Range Status   Specimen Description BLOOD LEFT ANTECUBITAL  Final   Special Requests   Final    BOTTLES DRAWN AEROBIC ONLY Blood Culture results may not  be optimal due to an inadequate volume of blood received in culture bottles   Culture   Final    NO GROWTH 3 DAYS Performed at [facility], [place] [address] [place], [place] [address]    Report Status PENDING  Incomplete  Resp Panel by RT-PCR (Flu A&B, Covid) Nasopharyngeal Swab     Status: None   Collection Time: 07/23/20 10:44 PM   Specimen: Nasopharyngeal Swab; Nasopharyngeal(NP) swabs in vial transport medium  Result Value Ref Range Status   SARS Coronavirus 2 by RT PCR NEGATIVE NEGATIVE Final    Comment: (NOTE) SARS-CoV-2 target nucleic acids are NOT DETECTED.  The SARS-CoV-2 RNA is generally detectable in upper respiratory specimens during the acute phase of infection. The lowest concentration of SARS-CoV-2 viral copies this assay can detect is 138 copies/mL. A negative result does not preclude SARS-Cov-2 infection and should not be used as the sole basis for treatment or other patient management decisions. A negative result may occur with  improper specimen collection/handling, submission of specimen other than nasopharyngeal swab, presence of viral mutation(s) within the areas targeted by this assay, and inadequate number of viral copies(<138 copies/mL). A negative result must be combined with clinical observations, patient history, and epidemiological information. The expected result is  Negative.  Fact Sheet for Patients:  EntrepreneurPulse.com.au  Fact Sheet for Healthcare Providers:  IncredibleEmployment.be  This test is no t yet approved or cleared by the [place] FDA and  has been authorized for detection and/or diagnosis of SARS-CoV-2 by FDA under an Emergency Use Authorization (EUA). This EUA will remain  in effect (meaning this test can be used) for the duration of the COVID-19 declaration under Section 564(b)(1) of the Act, 21 U.S.C.section 360bbb-3(b)(1), unless the authorization is terminated  or revoked sooner.       Influenza A by PCR NEGATIVE NEGATIVE Final   Influenza B by PCR NEGATIVE NEGATIVE Final    Comment: (NOTE) The Xpert Xpress SARS-CoV-2/FLU/RSV plus assay is intended as an aid in the diagnosis of influenza from Nasopharyngeal swab specimens and should not be used as a sole basis for treatment. Nasal washings and aspirates are unacceptable for Xpert Xpress SARS-CoV-2/FLU/RSV testing.  Fact Sheet for Patients: EntrepreneurPulse.com.au  Fact Sheet for Healthcare Providers: IncredibleEmployment.be  This test is not yet approved or cleared by the [place] FDA and has been authorized for detection and/or diagnosis of SARS-CoV-2 by FDA under an Emergency Use Authorization (EUA). This EUA will remain in effect (meaning this test can be used) for the duration of the COVID-19 declaration under Section 564(b)(1) of the Act, 21 U.S.C. section 360bbb-3(b)(1), unless the authorization is terminated or revoked.  Performed at [facility], [place] [address], [place], [place] [address]   Body fluid culture w Gram Stain     Status: None (Preliminary result)   Collection Time: 07/24/20 12:27 AM   Specimen: Body Fluid  Result Value Ref Range Status   Specimen Description FLUID ANKLE  Final   Special Requests NONE  Final   Gram Stain   Final    WBC PRESENT,  PREDOMINANTLY MONONUCLEAR NO ORGANISMS SEEN CYTOSPIN SMEAR    Culture   Final    NO GROWTH 2 DAYS Performed at [facility], [address] [name], [place] [address]    Report Status PENDING  Incomplete      Radiology Studies: No results found.  Scheduled Meds:  (feeding supplement) PROSource Plus  30 mL Oral TID BM  allopurinol  300 mg Oral Daily   cyanocobalamin  1,000 mcg Intramuscular Q30 days   enoxaparin (LOVENOX) injection  40 mg Subcutaneous Daily   feeding supplement  237 mL Oral Q24H   multivitamin with minerals  1 tablet Oral Daily   nutrition supplement (JUVEN)  1 packet Oral BID BM   Continuous Infusions:  0.9 % NaCl with KCl 20 mEq / L 100 mL/hr at 07/26/20 0604   piperacillin-tazobactam (ZOSYN)  IV 3.375 g (07/26/20 0605)   vancomycin 1,500 mg (07/26/20 1215)     LOS: 3 days   Time spent: 25 minutes.  Patrecia Pour, MD [facility] [email or web address] 07/26/2020, 1:42 PM

## 2020-07-26 NOTE — Progress Notes (Signed)
Inpatient Rehab Admissions Coordinator:   Consult received and chart reviewed.  It does not appear that pt has the medical necessity to support a CIR admission at this time, and [facility] Medicare not likely to approve admission.  Would recommend f/u in a lower level of care.   Shann Medal, PT, DPT Admissions Coordinator [phone] 07/26/20  12:15 PM

## 2020-07-27 DIAGNOSIS — M009 Pyogenic arthritis, unspecified: Secondary | ICD-10-CM | POA: Diagnosis not present

## 2020-07-27 LAB — CBC
HCT: 30.8 % — ABNORMAL LOW (ref 39.0–52.0)
Hemoglobin: 9.4 g/dL — ABNORMAL LOW (ref 13.0–17.0)
MCH: 25.5 pg — ABNORMAL LOW (ref 26.0–34.0)
MCHC: 30.5 g/dL (ref 30.0–36.0)
MCV: 83.5 fL (ref 80.0–100.0)
Platelets: 442 10*3/uL — ABNORMAL HIGH (ref 150–400)
RBC: 3.69 MIL/uL — ABNORMAL LOW (ref 4.22–5.81)
RDW: 13 % (ref 11.5–15.5)
WBC: 9.7 10*3/uL (ref 4.0–10.5)
nRBC: 0 % (ref 0.0–0.2)

## 2020-07-27 LAB — BODY FLUID CULTURE W GRAM STAIN: Culture: NO GROWTH

## 2020-07-27 LAB — BASIC METABOLIC PANEL
Anion gap: 7 (ref 5–15)
BUN: 6 mg/dL (ref 6–20)
CO2: 34 mmol/L — ABNORMAL HIGH (ref 22–32)
Calcium: 8.8 mg/dL — ABNORMAL LOW (ref 8.9–10.3)
Chloride: 100 mmol/L (ref 98–111)
Creatinine, Ser: 0.66 mg/dL (ref 0.61–1.24)
GFR, Estimated: >60 mL/min (ref >60)
Glucose, Bld: 103 mg/dL — ABNORMAL HIGH (ref 70–99)
Potassium: 3.6 mmol/L (ref 3.5–5.1)
Sodium: 141 mmol/L (ref 135–145)

## 2020-07-27 LAB — VANCOMYCIN, TROUGH
Vancomycin Tr: 24 ug/mL (ref 15–20)
Vancomycin Tr: 11 ug/mL — ABNORMAL LOW (ref 15–20)

## 2020-07-27 LAB — GLUCOSE, CAPILLARY: Glucose-Capillary: 103 mg/dL — ABNORMAL HIGH (ref 70–99)

## 2020-07-27 MED ORDER — METHOCARBAMOL 500 MG PO TABS
500.0000 mg | ORAL_TABLET | Freq: Three times a day (TID) | ORAL | Status: DC
  Administered 2020-07-27 – 2020-07-30 (×9): 500 mg via ORAL
  Filled 2020-07-27 (×9): qty 1

## 2020-07-27 MED ORDER — HYDRALAZINE HCL 25 MG PO TABS
25.0000 mg | ORAL_TABLET | Freq: Four times a day (QID) | ORAL | Status: DC
  Administered 2020-07-27 – 2020-07-29 (×9): 25 mg via ORAL
  Filled 2020-07-27 (×8): qty 1

## 2020-07-27 NOTE — Progress Notes (Signed)
     Andrew Haley is a 55 y.o. male   Orthopaedic diagnosis: Left ankle pain and swelling with overlying cellulitis and large ankle effusion on x-ray with significant degenerative changes of the ankle and hindfoot  Subjective: Patient notes vast improvement in his symptoms since admission.  He is able to walk on the ankle and foot today to the bathroom without significant discomfort.  He is able to range the ankle without discomfort.  Patient notes he is nearly at his baseline level of swelling and discomfort.  Objectyive: Vitals:   07/27/20 0718 07/27/20 1126  BP: (!) 173/90 (!) 166/80  Pulse: 84 78  Resp: 20 18  Temp: 98.4 F (36.9 C) 97.8 F (36.6 C)  SpO2:  98%     Exam: Awake and alert Respirations even and unlabored No acute distress  Left ankle and foot with swelling.  There is wrinkles present indicating reduction in swelling.  There has been a reduction in the erythema.  Patient has limited ankle motion but no significant pain with passive and active range of motion.  Virtually no hindfoot motion.  Tender to palpation about the leg and dorsal foot that has improved.  Foot is warm and well-perfused.  Microbiology: No growth x3 days.  Negative gram stain.  Mononuclear white blood cell present on gram stain.  Assessment: Left ankle and foot cellulitis with underlying ankle and hindfoot arthritis in the setting of prior ankle fracture surgery   Plan: I had a lengthy conversation with the patient today.  Patient has had improvement in his symptoms overall with antibiotic treatment.  We discussed the role of ankle arthrotomy and irrigation with tibial saucerization and tissue sample.  The benefit of this would be for possible identification of bacteria however we discussed that if the culture from the tibia comes back as osteomyelitis is only definitive surgery would be amputation which he is adamantly against.  Again the patient notes that he is nearly at his baseline swelling  and discomfort and has had improvement in his symptoms to the point where he would like to be discharged home as soon as he is medically able.  From an orthopedic standpoint I feel that this is reasonable as long as he has close outpatient follow-up and continued antibiotic treatment per hospitalist team and potentially infectious disease.  I will follow him up as an outpatient as we had previously discussed ankle arthrodesis.  We will have to hold off on the arthrodesis for now to ensure that there was no residual soft tissue infection.  Likely course of action will be antibiotic treatment for his soft tissue infection per hospitalist, internist or infectious disease and once he improves clinically and with regard to laboratory values such as ESR, CRP and CBC would consider a period of no antibiotics to ensure no return of infectious symptoms and then the possibility of biopsy and culture to ensure eradication of any infection.  If we are able to demonstrate no infection would consider TTC arthrodesis at that point.  Currently no plan for surgery after this discussion.  Patient is in agreement with this plan and again is voicing desire to be discharged as he is feeling up to it.  Will defer discharge to hospitalist team.  Okay from orthopedic standpoint.   Radene Journey, MD

## 2020-07-27 NOTE — Plan of Care (Signed)

## 2020-07-27 NOTE — Evaluation (Signed)
Occupational Therapy Evaluation Patient Details Name: Andrew Haley MRN: [id number] DOB: 09-27-1965 Today's Date: 07/27/2020    History of Present Illness Pt is a 55 y.o. male who presented 6/13 with L ankle pain and swelling. Of note, pt was admitted 6/8 - 6/9 at [facility] for cellulitis and was discharged home on antibiotic. CT findings of joint effusion and bony erosions of the distal tibia, talus, dorsal calcaneus concerning for septic arthritis. PMH: arthritis, obesity, HTN, prostate cancer, and sleep apnea.   Clinical Impression   Pt eager to use bathroom. Ambulated to bathroom with min guard assistance to comfort height toilet with RW. Managed shorts and pericare with supervision for safety. Able to don R sock, but needed assist for L. Pt states he will not wear socks at home. Recommended slip on shoes. Pt plans to go home with neighbors. Updated d/c recommendation to [facility].     Follow Up Recommendations  Home health OT    Equipment Recommendations  None recommended by OT    Recommendations for Other Services       Precautions / Restrictions Precautions Precautions: Fall      Mobility Bed Mobility                    Transfers Overall transfer level: Needs assistance Equipment used: Rolling walker (2 wheeled) Transfers: Sit to/from Stand Sit to Stand: Supervision         General transfer comment: from chair and comfort height toilet    Balance Overall balance assessment: Needs assistance   Sitting balance-[name]: Good       Standing balance-[name]: Poor Standing balance comment: heavily reliant on RW                           ADL either performed or assessed with clinical judgement   ADL Overall ADL's : Needs assistance/impaired     Grooming: Wash/dry hands;Sitting;Set up               Lower Body Dressing: Sitting/lateral leans;Moderate assistance Lower Body Dressing Details (indicate cue type and reason):  assist for L sock Toilet Transfer: Min guard;Ambulation;RW;Comfort height toilet;Grab bars   Toileting- Clothing Manipulation and Hygiene: Supervision/safety;Sit to/from stand       Functional mobility during ADLs: Surveyor, minerals     Praxis      Pertinent Vitals/Pain Pain Assessment: Faces Faces Pain Scale: Hurts even more Pain Location: LLE ankle/foot Pain Descriptors / Indicators: Discomfort;Grimacing;Guarding Pain Intervention(s): Monitored during session;Repositioned;Premedicated before session     Hand Dominance     Extremity/Trunk Assessment             Communication     Cognition Arousal/Alertness: Awake/alert Behavior During Therapy: Impulsive Overall Cognitive Status: Within Functional Limits for tasks assessed                                 General Comments: (P) slightly impulsive   General Comments       Exercises     Shoulder Instructions      Home Living  Prior Functioning/Environment                   OT Problem List:        OT Treatment/Interventions:      OT Goals(Current goals can be found in the care plan section) Acute Rehab OT Goals Patient Stated Goal: to go home and not get an amputation OT Goal Formulation: With patient Time For Goal Achievement: 08/08/20 Potential to Achieve Goals: Good  OT Frequency: Min 2X/week   Barriers to D/C:            Co-evaluation              AM-PAC OT "6 Clicks" Daily Activity     Outcome Measure Help from another person eating meals?: None Help from another person taking care of personal grooming?: A Little Help from another person toileting, which includes using toliet, bedpan, or urinal?: A Little Help from another person bathing (including washing, rinsing, drying)?: A Lot Help from another person to put on and taking off regular upper body clothing?: A  Little Help from another person to put on and taking off regular lower body clothing?: A Lot 6 Click Score: 17   End of Session Equipment Utilized During Treatment: Rolling walker  Activity Tolerance: Patient tolerated treatment well;Patient limited by pain Patient left: in chair;with call bell/phone within reach  OT Visit Diagnosis: Unsteadiness on feet (R26.81);Pain                Time: 4332-9518 OT Time Calculation (min): 29 min Charges:  OT General Charges $OT Visit: 1 Visit OT Treatments $Self Care/Home Management : 23-37 mins  Andrew Haley, OTR/L Acute Rehabilitation Services Pager: [phone] Office: [phone]  Andrew Haley 07/27/2020, 2:34 PM

## 2020-07-27 NOTE — Progress Notes (Signed)
Physical Therapy Treatment Patient Details Name: Andrew Haley MRN: [id number] DOB: 01/03/1966 Today's Date: 07/27/2020    History of Present Illness Pt is a 55 y.o. male who presented 6/13 with L ankle pain and swelling. Of note, pt was admitted 6/8 - 6/9 at [facility] for cellulitis and was discharged home on antibiotic. CT findings of joint effusion and bony erosions of the distal tibia, talus, dorsal calcaneus concerning for septic arthritis. PMH: arthritis, obesity, HTN, prostate cancer, and sleep apnea.    PT Comments    Pt with improved weight bearing on his L foot this date, tolerating up to ~50% of his weight to ambulate up to ~60 ft with a RW and min guard-supervision for safety. Provided handout for edema management and HEP consisting of ankle pumps, ankle circles, and LAQ. Pt reports he plans to d/c to his friend's 1-level house with a level entry, in which his friend can provide 24/7 care as needed. Thus, as pt was denied by CIR, changed d/c recs to HHPT. Will continue to follow acutely.    Follow Up Recommendations  Supervision for mobility/OOB;Home health PT (denied by CIR)     Equipment Recommendations  Rolling walker with 5" wheels;3in1 (PT)    Recommendations for Other Services       Precautions / Restrictions Precautions Precautions: Fall;Other (comment) Precaution Comments: LLE swelling, severe pain Restrictions Weight Bearing Restrictions: No    Mobility  Bed Mobility               General bed mobility comments: Sitting up in recliner upon arrival.    Transfers Overall transfer level: Needs assistance Equipment used: Rolling walker (2 wheeled) Transfers: Sit to/from [facility] Sit to Stand: Supervision         General transfer comment: Supervision for safety with sit <> stand from recliner to RW. Pt places L foot anterior to R to allow for less weight bearing and pain on his L.  Ambulation/Gait Ambulation/Gait  assistance: Min guard;Supervision Gait Distance (Feet): 60 Feet Assistive device: Rolling walker (2 wheeled) Gait Pattern/deviations: Step-to pattern;Decreased stride length;Decreased stance time - left;Decreased weight shift to left;Decreased dorsiflexion - left;Trunk flexed;Antalgic Gait velocity: reduced Gait velocity interpretation: <1.8 ft/sec, indicate of risk for recurrent falls General Gait Details: Pt with antalgic gait pattern, demonstrating heavy use of his UEs on the RW with decreased L weight shift and stance time. Pt placing ~50% of his weight through his L lower extremity, monitored by placing therapist's hand under pt's foot and through observation. Min guard-supervision for safety, no LOB.   Stairs             Wheelchair Mobility    Modified Rankin (Stroke Patients Only)       Balance Overall balance assessment: Needs assistance Sitting-balance support: No upper extremity supported;Feet supported Sitting balance-[name]: Good     Standing balance support: Bilateral upper extremity supported;During functional activity Standing balance-[name]: Poor Standing balance comment: heavily reliant on RW                            Cognition Arousal/Alertness: Awake/alert Behavior During Therapy: WFL for tasks assessed/performed Overall Cognitive Status: Within Functional Limits for tasks assessed                                 General Comments: Pt can be impulsive at times, needing  cues to pause and direct pt.      Exercises General Exercises - Lower Extremity Ankle Circles/Pumps: AAROM;Left;10 reps;Seated Other Exercises Other Exercises: Gentle stretch to L gastroc    General Comments General comments (skin integrity, edema, etc.): Provided handout for edema management and HEP consisting of ankle pumps, ankle circles, and LAQ      Pertinent Vitals/Pain Pain Assessment: Faces Faces Pain Scale: Hurts whole lot Pain  Location: LLE ankle/foot Pain Descriptors / Indicators: Discomfort;Sharp;Moaning;Grimacing;Guarding Pain Intervention(s): Limited activity within patient's tolerance;Monitored during session;Repositioned    Home Living                      Prior Function            PT Goals (current goals can now be found in the care plan section) Acute Rehab PT Goals Patient Stated Goal: to stretch his L gastroc and not go to a SNF PT Goal Formulation: With patient Time For Goal Achievement: 08/08/20 Potential to Achieve Goals: Good Progress towards PT goals: Progressing toward goals    Frequency    Min 3X/week      PT Plan Discharge plan needs to be updated    Co-evaluation              AM-PAC PT "6 Clicks" Mobility   Outcome Measure  Help needed turning from your back to your side while in a flat bed without using bedrails?: None Help needed moving from lying on your back to sitting on the side of a flat bed without using bedrails?: None Help needed moving to and from a bed to a chair (including a wheelchair)?: A Little Help needed standing up from a chair using your arms (e.g., wheelchair or bedside chair)?: A Little Help needed to walk in hospital room?: A Little Help needed climbing 3-5 steps with a railing? : A Lot 6 Click Score: 19    End of Session Equipment Utilized During Treatment: Gait belt Activity Tolerance: Patient limited by pain Patient left: in chair;with call bell/phone within reach;with chair alarm set   PT Visit Diagnosis: Unsteadiness on feet (R26.81);Other abnormalities of gait and mobility (R26.89);Muscle weakness (generalized) (M62.81);Difficulty in walking, not elsewhere classified (R26.2);Pain Pain - Right/Left: Left Pain - part of body: Leg     Time: 2449-7530 PT Time Calculation (min) (ACUTE ONLY): 14 min  Charges:  $Therapeutic Activity: 8-22 mins                     Moishe Spice, PT, DPT Acute Rehabilitation Services  Pager:  [phone] Office: [phone]    Orvan Falconer 07/27/2020, 3:45 PM

## 2020-07-27 NOTE — Care Management Important Message (Signed)
Important Message  Patient Details  Name: Andrew Haley MRN: [id number] Date of Birth: 1965-05-02   Medicare Important Message Given:  Yes     [name] [name] 07/27/2020, 8:44 AM

## 2020-07-27 NOTE — Progress Notes (Signed)
PROGRESS NOTE  CHACE KLIPPEL  NAT:[id number] DOB: [birth year] DOA: 07/23/2020 PCP: Redmond School, MD   Brief Narrative: Andrew Haley is a 55 y.o. male with a history of chronic opioid-dependent low back pain, post-traumatic severe left ankle arthritis, gout, obesity, OSA on CPAP, localized prostate CA s/p prostatectomy who was admitted at [facility] 6/8 - 6/9 treated for LLE cellulitis discharged on doxycycline and returned to [facility] 6/13 with left ankle pain and swelling, tachycardia, leukocytosis (WBC 12.2k), and low grade fever with CT findings of joint effusion and bony erosions of the distal tibia, talus, dorsal calcaneus concerning for for septic arthritis. IV antibiotics were started and the patient was admitted with orthopedics consulting. Arthrocentesis revealed turbid fluid with 35k WBCs (99% PMNs), negative gram stain.   Assessment & Plan: Principal Problem:   Septic arthritis ([place]) Active Problems:   Prostate cancer ([place])   Essential hypertension  Sepsis due to LLE cellulitis with concern for septic ankle arthritis in patient with posttraumatic left ankle arthritis w/hardware: WBC normalized, LA reassuring.  - Continue vancomycin, zosyn, tailor to culture data. Blood cultures 6/13 remain NGTD. Arthrocentesis culture from 6/14 NGTD.  - Pt declining operative management. - Continue pain control with primarily po oxycodone which is home medication. Pt is opioid tolerant.  - History of gout, no crystals in synovial fluid. Will continue allopurinol. - PT/OT. Was unable to bear any weight on LLE at time of evaluations.  Will be going home with home health.  Patient requesting discussion again with orthopedics and since there is concern for septic arthritis I will involve ID for further input on antibiotics.  Patient reports severe pain right now as well as muscle spasm.  Add Robaxin.  OSA:  - Continue CPAP.   Anemia: Appears chronic but slightly worsening.  - Recheck CBC in AM  B12  deficiency: Level is 71.  - Supplement ordered IM to continue po thereafter  Obesity: BMI 31.   Hypoalbuminemia:  - Dietitian consulted  Hypokalemia: Improved with supplementation.  -Hold fluids  History of prostate CA: s/p prostatectomy.   DVT prophylaxis: Lovenox Code Status: Full Family Communication: None at bedside Disposition Plan:  Status is: Inpatient  Remains inpatient appropriate because:Ongoing active pain requiring inpatient pain management, Ongoing diagnostic testing needed not appropriate for outpatient work up, and Inpatient level of care appropriate due to severity of illness  Dispo: The patient is from: Home              Anticipated d/c is to:  TBD   - CIR consulted.              Patient currently is not medically stable to d/c.   Difficult to place patient No  Consultants:  Orthopedics  Procedures:  None  Antimicrobials: Vancomycin, zosyn   Subjective: Continues to have pain and swelling.  Reports spasm.  No nausea no vomiting.  Reports constipation.  Objective: Vitals:   07/27/20 0357 07/27/20 0718 07/27/20 1126 07/27/20 1554  BP: (!) 184/102 (!) 173/90 (!) 166/80 (!) 173/92  Pulse: 89 84 78 94  Resp: 20 20 18 18   Temp: 98.3 F (36.8 C) 98.4 F (36.9 C) 97.8 F (36.6 C) 98 F (36.7 C)  TempSrc: Oral Oral    SpO2: 98%  98% 98%  Weight:      Height:        Intake/Output Summary (Last 24 hours) at 07/27/2020 1801 Last data filed at 07/27/2020 1645 Gross per 24 hour  Intake 2899.17 ml  Output 6250 ml  Net -3350.83 ml    Filed Weights   07/23/20 1747 07/24/20 2015  Weight: 115.7 kg 114.2 kg   General: Appear in mild distress, no Rash; Oral Mucosa Clear, moist. no Abnormal Neck Mass Or lumps, Conjunctiva normal  Cardiovascular: S1 and S2 Present, no Murmur, Respiratory: good respiratory effort, Bilateral Air entry present and CTA, no Crackles, no wheezes Abdomen: Bowel Sound present, Soft and no tenderness Extremities: Left pedal edema  with erythema Neurology: alert and oriented to time, place, and person affect appropriate. no new focal deficit Gait not checked due to patient safety concerns    Data Reviewed: I have personally reviewed following labs and imaging studies  CBC: Recent Labs  Lab 07/23/20 1847 07/24/20 0512 07/25/20 0231 07/26/20 0330 07/27/20 0013  WBC 12.2* 10.9* 7.2 8.6 9.7  NEUTROABS 10.3*  --  5.3  --   --   HGB 10.5* 9.4* 9.1* 8.9* 9.4*  HCT 33.8* 30.4* 29.4* 29.6* 30.8*  MCV 82.4 83.3 83.5 83.9 83.5  PLT 428* 336 304 358 442*    Basic Metabolic Panel: Recent Labs  Lab 07/23/20 1847 07/24/20 0512 07/25/20 0231 07/26/20 0330 07/27/20 0013  NA 132* 134* 138 138 141  K 2.9* 3.1* 3.3* 3.5 3.6  CL 89* 93* 96* 96* 100  CO2 33* 30 34* 35* 34*  GLUCOSE 105* 127* 100* 117* 103*  BUN 15 13 11 8 6   CREATININE 0.82 0.76 0.64 0.60* 0.66  CALCIUM 9.0 8.7* 8.3* 8.5* 8.8*  MG  --   --   --  1.8  --     GFR: Estimated Creatinine Clearance: 140.6 mL/min (by C-G formula based on SCr of 0.66 mg/dL). Liver Function Tests: Recent Labs  Lab 07/23/20 1847 07/25/20 0231  AST 16 12*  ALT 15 14  ALKPHOS 81 48  BILITOT 0.5 0.3  PROT 8.4* 5.7*  ALBUMIN 2.7* 1.9*    No results for input(s): LIPASE, AMYLASE in the last 168 hours. No results for input(s): AMMONIA in the last 168 hours. Coagulation Profile: Recent Labs  Lab 07/23/20 1847  INR 1.1    Cardiac Enzymes: No results for input(s): CKTOTAL, CKMB, CKMBINDEX, TROPONINI in the last 168 hours. BNP (last 3 results) No results for input(s): PROBNP in the last 8760 hours. HbA1C: No results for input(s): HGBA1C in the last 72 hours. CBG: Recent Labs  Lab 07/25/20 1722 07/25/20 2313 07/26/20 1200 07/26/20 2325 07/27/20 0716  GLUCAP 104* 106* 137* 95 103*    Lipid Profile: No results for input(s): CHOL, HDL, LDLCALC, TRIG, CHOLHDL, LDLDIRECT in the last 72 hours. Thyroid Function Tests: No results for input(s): TSH, T4TOTAL,  FREET4, T3FREE, THYROIDAB in the last 72 hours. Anemia Panel: Recent Labs    07/26/20 0330  VITAMINB12 71*  FOLATE 9.5  FERRITIN 108  TIBC 207*  IRON 17*    Urine analysis:    Component Value Date/Time   COLORURINE YELLOW 07/23/2020 [place] 07/23/2020 1749   LABSPEC 1.013 07/23/2020 1749   PHURINE 5.0 07/23/2020 1749   GLUCOSEU NEGATIVE 07/23/2020 1749   HGBUR NEGATIVE 07/23/2020 1749   BILIRUBINUR NEGATIVE 07/23/2020 1749   KETONESUR NEGATIVE 07/23/2020 1749   PROTEINUR NEGATIVE 07/23/2020 1749   UROBILINOGEN 1.0 05/25/2014 0822   NITRITE NEGATIVE 07/23/2020 1749   LEUKOCYTESUR NEGATIVE 07/23/2020 1749   Recent Results (from the past 240 hour(s))  Resp Panel by RT-PCR (Flu A&B, Covid) Nasopharyngeal Swab     Status: None   Collection  Time: 07/18/20  5:56 PM   Specimen: Nasopharyngeal Swab; Nasopharyngeal(NP) swabs in vial transport medium  Result Value Ref Range Status   SARS Coronavirus 2 by RT PCR NEGATIVE NEGATIVE Final    Comment: (NOTE) SARS-CoV-2 target nucleic acids are NOT DETECTED.  The SARS-CoV-2 RNA is generally detectable in upper respiratory specimens during the acute phase of infection. The lowest concentration of SARS-CoV-2 viral copies this assay can detect is 138 copies/mL. A negative result does not preclude SARS-Cov-2 infection and should not be used as the sole basis for treatment or other patient management decisions. A negative result may occur with  improper specimen collection/handling, submission of specimen other than nasopharyngeal swab, presence of viral mutation(s) within the areas targeted by this assay, and inadequate number of viral copies(<138 copies/mL). A negative result must be combined with clinical observations, patient history, and epidemiological information. The expected result is Negative.  Fact Sheet for Patients:  EntrepreneurPulse.com.au  Fact Sheet for Healthcare Providers:   IncredibleEmployment.be  This test is no t yet approved or cleared by the [place] FDA and  has been authorized for detection and/or diagnosis of SARS-CoV-2 by FDA under an Emergency Use Authorization (EUA). This EUA will remain  in effect (meaning this test can be used) for the duration of the COVID-19 declaration under Section 564(b)(1) of the Act, 21 U.S.C.section 360bbb-3(b)(1), unless the authorization is terminated  or revoked sooner.       Influenza A by PCR NEGATIVE NEGATIVE Final   Influenza B by PCR NEGATIVE NEGATIVE Final    Comment: (NOTE) The Xpert Xpress SARS-CoV-2/FLU/RSV plus assay is intended as an aid in the diagnosis of influenza from Nasopharyngeal swab specimens and should not be used as a sole basis for treatment. Nasal washings and aspirates are unacceptable for Xpert Xpress SARS-CoV-2/FLU/RSV testing.  Fact Sheet for Patients: EntrepreneurPulse.com.au  Fact Sheet for Healthcare Providers: IncredibleEmployment.be  This test is not yet approved or cleared by the [place] FDA and has been authorized for detection and/or diagnosis of SARS-CoV-2 by FDA under an Emergency Use Authorization (EUA). This EUA will remain in effect (meaning this test can be used) for the duration of the COVID-19 declaration under Section 564(b)(1) of the Act, 21 U.S.C. section 360bbb-3(b)(1), unless the authorization is terminated or revoked.  Performed at [facility], [address], [place], [place] [address]   Urine culture     Status: Abnormal   Collection Time: 07/23/20  5:49 PM   Specimen: In/Out Cath Urine  Result Value Ref Range Status   Specimen Description IN/OUT CATH URINE  Final   Special Requests   Final    NONE Performed at [facility], [place] [address], [place], [place] [address]    Culture MULTIPLE SPECIES PRESENT, SUGGEST RECOLLECTION (A)  Final   Report Status 07/26/2020 FINAL  Final   Culture, blood (Routine x 2)     Status: None (Preliminary result)   Collection Time: 07/23/20  6:19 PM   Specimen: BLOOD  Result Value Ref Range Status   Specimen Description BLOOD RIGHT ANTECUBITAL  Final   Special Requests   Final    BOTTLES DRAWN AEROBIC ONLY Blood Culture adequate volume   Culture   Final    NO GROWTH 4 DAYS Performed at [facility], [place] [address], [place], [place] [address]    Report Status PENDING  Incomplete  Culture, blood (Routine x 2)     Status: None (Preliminary result)   Collection Time: 07/23/20  6:19 PM   Specimen: BLOOD  Result Value Ref Range Status   Specimen Description BLOOD LEFT ANTECUBITAL  Final   Special Requests   Final    BOTTLES DRAWN AEROBIC ONLY Blood Culture results may not be optimal due to an inadequate volume of blood received in culture bottles   Culture   Final    NO GROWTH 4 DAYS Performed at [facility] [address] [place], [place] [address]    Report Status PENDING  Incomplete  Resp Panel by RT-PCR (Flu A&B, Covid) Nasopharyngeal Swab     Status: None   Collection Time: 07/23/20 10:44 PM   Specimen: Nasopharyngeal Swab; Nasopharyngeal(NP) swabs in vial transport medium  Result Value Ref Range Status   SARS Coronavirus 2 by RT PCR NEGATIVE NEGATIVE Final    Comment: (NOTE) SARS-CoV-2 target nucleic acids are NOT DETECTED.  The SARS-CoV-2 RNA is generally detectable in upper respiratory specimens during the acute phase of infection. The lowest concentration of SARS-CoV-2 viral copies this assay can detect is 138 copies/mL. A negative result does not preclude SARS-Cov-2 infection and should not be used as the sole basis for treatment or other patient management decisions. A negative result may occur with  improper specimen collection/handling, submission of specimen other than nasopharyngeal swab, presence of viral mutation(s) within the areas targeted by this assay, and inadequate number of  viral copies(<138 copies/mL). A negative result must be combined with clinical observations, patient history, and epidemiological information. The expected result is Negative.  Fact Sheet for Patients:  EntrepreneurPulse.com.au  Fact Sheet for Healthcare Providers:  IncredibleEmployment.be  This test is no t yet approved or cleared by the [place] FDA and  has been authorized for detection and/or diagnosis of SARS-CoV-2 by FDA under an Emergency Use Authorization (EUA). This EUA will remain  in effect (meaning this test can be used) for the duration of the COVID-19 declaration under Section 564(b)(1) of the Act, 21 U.S.C.section 360bbb-3(b)(1), unless the authorization is terminated  or revoked sooner.       Influenza A by PCR NEGATIVE NEGATIVE Final   Influenza B by PCR NEGATIVE NEGATIVE Final    Comment: (NOTE) The Xpert Xpress SARS-CoV-2/FLU/RSV plus assay is intended as an aid in the diagnosis of influenza from Nasopharyngeal swab specimens and should not be used as a sole basis for treatment. Nasal washings and aspirates are unacceptable for Xpert Xpress SARS-CoV-2/FLU/RSV testing.  Fact Sheet for Patients: EntrepreneurPulse.com.au  Fact Sheet for Healthcare Providers: IncredibleEmployment.be  This test is not yet approved or cleared by the [place] FDA and has been authorized for detection and/or diagnosis of SARS-CoV-2 by FDA under an Emergency Use Authorization (EUA). This EUA will remain in effect (meaning this test can be used) for the duration of the COVID-19 declaration under Section 564(b)(1) of the Act, 21 U.S.C. section 360bbb-3(b)(1), unless the authorization is terminated or revoked.  Performed at [facility], [place] [address] [place], [place] [address]   Body fluid culture w Gram Stain     Status: None   Collection Time: 07/24/20 12:27 AM   Specimen: Body Fluid   Result Value Ref Range Status   Specimen Description FLUID ANKLE  Final   Special Requests NONE  Final   Gram Stain   Final    WBC PRESENT, PREDOMINANTLY MONONUCLEAR NO ORGANISMS SEEN CYTOSPIN SMEAR    Culture   Final    NO GROWTH 3 DAYS Performed at [facility], [address], [place], [place] [address]    Report Status 07/27/2020 FINAL  Final      Radiology Studies: No results found.  Scheduled Meds:  (feeding supplement) PROSource Plus  30 mL Oral TID BM   allopurinol  300 mg Oral Daily   cyanocobalamin  1,000 mcg Intramuscular Q30 days   enoxaparin (LOVENOX) injection  40 mg Subcutaneous Daily   feeding supplement  237 mL Oral Q24H   hydrALAZINE  25 mg Oral Q6H   methocarbamol  500 mg Oral TID   multivitamin with minerals  1 tablet Oral Daily   nutrition supplement (JUVEN)  1 packet Oral BID BM   vitamin B-12  1,000 mcg Oral Daily   Continuous Infusions:  piperacillin-tazobactam (ZOSYN)  IV 3.375 g (07/27/20 1535)   vancomycin Stopped (07/27/20 1501)     LOS: 4 days   Time spent: 25 minutes.  Berle Mull, MD [facility] [email or web address] 07/27/2020, 6:01 PM

## 2020-07-27 NOTE — Progress Notes (Signed)
Pharmacy Antibiotic Note  Andrew Haley is a 55 y.o. male admitted on 07/23/2020 with Cellulitis with possible septic arthritis of L ankle.  Pharmacy has been consulted for Vanco dosing.  ID: Cellulitis with possible septic arthritis of L ankle s/p arthrocentesis. - CT: suggestive of septic arthritis of L ankle - Afeb. WBC 9.7 - Ortho consulted - holding off on I&D unless cx positive, likely needs BKA for pain elimination but pt not ammenable  - Vanc 1500 q12h (eAUC 500, Scr 0.82, Vd 0.5) - 6/17: Peak 28 (6/16), trough 24 (drawn while dose infusing), Trough 11 = AUC 489 con't same dosing  Vanc 6/13 >> Zosyn 6/14 >>  613 bcx: NGTD 6/13 ucx: neg 6/14 ankle fluid: neg  Plan: Cont vanc 1500 q12h     Height: 6\' 3"  (190.5 cm) Weight: 114.2 kg (251 lb 12.3 oz) IBW/kg (Calculated) : 84.5  Temp (24hrs), Avg:98.2 F (36.8 C), Min:97.8 F (36.6 C), Max:98.5 F (36.9 C)  Recent Labs  Lab 07/23/20 1749 07/23/20 1847 07/23/20 2251 07/24/20 0512 07/24/20 1108 07/24/20 2011 07/25/20 0231 07/26/20 0330 07/26/20 1527 07/27/20 0013 07/27/20 1037  WBC  --  12.2*  --  10.9*  --   --  7.2 8.6  --  9.7  --   CREATININE  --  0.82  --  0.76  --   --  0.64 0.60*  --  0.66  --   LATICACIDVEN 0.9  --  1.1  --  0.8 1.3  --   --   --   --   --   VANCOTROUGH  --   --   --   --   --   --   --   --   --  24* 11*  VANCOPEAK  --   --   --   --   --   --   --   --  28*  --   --     Estimated Creatinine Clearance: 140.6 mL/min (by C-G formula based on SCr of 0.66 mg/dL).    No Known Allergies  Andrew Haley, PharmD, BCPS Clinical Staff Pharmacist [email or web address]  Andrew Haley 07/27/2020 12:55 PM

## 2020-07-28 ENCOUNTER — Inpatient Hospital Stay: Payer: Self-pay

## 2020-07-28 DIAGNOSIS — M009 Pyogenic arthritis, unspecified: Secondary | ICD-10-CM | POA: Diagnosis not present

## 2020-07-28 LAB — CULTURE, BLOOD (ROUTINE X 2)
Culture: NO GROWTH
Culture: NO GROWTH
Special Requests: ADEQUATE

## 2020-07-28 LAB — C-REACTIVE PROTEIN: CRP: 6.5 mg/dL — ABNORMAL HIGH (ref <1.0)

## 2020-07-28 LAB — CBC WITH DIFFERENTIAL/PLATELET
Abs Immature Granulocytes: 0.11 10*3/uL — ABNORMAL HIGH (ref 0.00–0.07)
Basophils Absolute: 0.1 10*3/uL (ref 0.0–0.1)
Basophils Relative: 1 %
Eosinophils Absolute: 0.6 10*3/uL — ABNORMAL HIGH (ref 0.0–0.5)
Eosinophils Relative: 6 %
HCT: 32.4 % — ABNORMAL LOW (ref 39.0–52.0)
Hemoglobin: 10.3 g/dL — ABNORMAL LOW (ref 13.0–17.0)
Immature Granulocytes: 1 %
Lymphocytes Relative: 16 %
Lymphs Abs: 1.6 10*3/uL (ref 0.7–4.0)
MCH: 25.8 pg — ABNORMAL LOW (ref 26.0–34.0)
MCHC: 31.8 g/dL (ref 30.0–36.0)
MCV: 81.2 fL (ref 80.0–100.0)
Monocytes Absolute: 0.6 10*3/uL (ref 0.1–1.0)
Monocytes Relative: 7 %
Neutro Abs: 6.6 10*3/uL (ref 1.7–7.7)
Neutrophils Relative %: 69 %
Platelets: 449 10*3/uL — ABNORMAL HIGH (ref 150–400)
RBC: 3.99 MIL/uL — ABNORMAL LOW (ref 4.22–5.81)
RDW: 12.9 % (ref 11.5–15.5)
WBC: 9.6 10*3/uL (ref 4.0–10.5)
nRBC: 0 % (ref 0.0–0.2)

## 2020-07-28 LAB — COMPREHENSIVE METABOLIC PANEL
ALT: 17 U/L (ref 0–44)
AST: 18 U/L (ref 15–41)
Albumin: 2.2 g/dL — ABNORMAL LOW (ref 3.5–5.0)
Alkaline Phosphatase: 53 U/L (ref 38–126)
Anion gap: 10 (ref 5–15)
BUN: 7 mg/dL (ref 6–20)
CO2: 31 mmol/L (ref 22–32)
Calcium: 9 mg/dL (ref 8.9–10.3)
Chloride: 98 mmol/L (ref 98–111)
Creatinine, Ser: 0.69 mg/dL (ref 0.61–1.24)
GFR, Estimated: >60 mL/min (ref >60)
Glucose, Bld: 109 mg/dL — ABNORMAL HIGH (ref 70–99)
Potassium: 3.8 mmol/L (ref 3.5–5.1)
Sodium: 139 mmol/L (ref 135–145)
Total Bilirubin: 0.5 mg/dL (ref 0.3–1.2)
Total Protein: 6.7 g/dL (ref 6.5–8.1)

## 2020-07-28 LAB — CK: Total CK: 27 U/L — ABNORMAL LOW (ref 49–397)

## 2020-07-28 LAB — SEDIMENTATION RATE: Sed Rate: 118 mm/hr — ABNORMAL HIGH (ref 0–16)

## 2020-07-28 MED ORDER — SODIUM CHLORIDE 0.9 % IV SOLN
8.0000 mg/kg | Freq: Every day | INTRAVENOUS | Status: DC
  Administered 2020-07-28 – 2020-07-30 (×3): 900 mg via INTRAVENOUS
  Filled 2020-07-28 (×3): qty 18

## 2020-07-28 MED ORDER — CHLORHEXIDINE GLUCONATE CLOTH 2 % EX PADS
6.0000 | MEDICATED_PAD | Freq: Every day | CUTANEOUS | Status: DC
  Administered 2020-07-28 – 2020-07-30 (×3): 6 via TOPICAL

## 2020-07-28 MED ORDER — SODIUM CHLORIDE 0.9% FLUSH
10.0000 mL | Freq: Two times a day (BID) | INTRAVENOUS | Status: DC
  Administered 2020-07-28 – 2020-07-29 (×3): 10 mL

## 2020-07-28 MED ORDER — SODIUM CHLORIDE 0.9% FLUSH: 10.0000 mL | INTRAVENOUS | Status: DC | PRN

## 2020-07-28 MED ORDER — SODIUM CHLORIDE 0.9 % IV SOLN
2.0000 g | INTRAVENOUS | Status: DC
  Administered 2020-07-28 – 2020-07-30 (×3): 2 g via INTRAVENOUS
  Filled 2020-07-28 (×3): qty 20

## 2020-07-28 NOTE — Consult Note (Signed)
[facility] Infectious Diseases                                                                                        Patient Identification: Patient Name: Andrew Haley MRN: [id number] [place] Date: 07/23/2020  5:33 PM Today's Date: 07/28/2020 Reason for consult: Septic arthritis Requesting provider: Cherylann Ratel  Principal Problem:   Septic arthritis [facility]) Active Problems:   Prostate cancer [facility])   Essential hypertension   Antibiotics: Vancomycin 6/13-current                    Zosyn 6/13-current  Lines/Tubes: PIV  Assessment Left ankle cellulitis/possible septic arthritis and osteomyelitis the setting of posttraumatic arthropathy and gout -Patient had a surgery in the left ankle approximately 2 years ago after a trauma and had plates and screws placed.  He had cortisone injections in his left ankle in the past.  Last injection was approximately 4 months ago.   -He was recently discharged from [facility]  on 6/9 for left ankle cellulitis and was discharged on doxycycline which he took for a couple of days but had progressively worsening pain and swelling leading to this admission.   Bilateral knee arthroplasty -denies any acute issues Gout  Recommendations  Continue vancomycin, pharmacy to dose.  Can change vancomycin to daptomycin 8 mg/KG if compatible with cost as an outpatient Change Zosyn to ceftriaxone 2 g IV daily Monitor CBC CMP and vancomycin trough ( CPK if Daptomycin used) Baseline ESR and CRP PICC line  Plan to treat for 4 weeks  A follow-up with [facility] has been made for monitoring antibiotics Follow-up with orthopedic surgery as instructed ID will sign off for now  Rest of the management as per the primary team. Please call with questions or concerns.  Thank you for the consult  Rosiland Oz, MD Infectious Disease Physician [facility] for Infectious  Disease [address] [place], [place] [address] Phone: [phone]  Fax: [phone]  __________________________________________________________________________________________________________ HPI and Hospital Course: 55 year old male with past medical history of bilateral total knee arthroplasty, hypertension, obesity/OSA, prostate cancer who presented to the ED on 6/13 with complaint of left ankle pain and swelling.  He had left ankle surgery couple of years ago and has known left ankle arthritis with possible posttraumatic versus crystalline arthropathy.  He states he has been having pain and swelling of his left ankle since that surgery and has had multiple cortisone injections in the left ankle.  Last injection was approximately 4 months ago. Patient was recently discharged on 6/9 for left ankle cellulitis and was prescribed a course of doxycycline for 10 days upon discharge.  He took the antibiotics as instructed.  However , left ankle pain and swelling got progressively worse even on antibiotics and presented to the ED.  At ED, patient was afebrile, mild leukocytosis of 10.9.  Potassium 2.9 CT left lower extremity with findings suggestive of septic arthritis of the left ankle with large complex tibiotalar joint effusion containing debris's.  Erosion centered at the subtalar and talonavicular joints also suspicious for septic arthritis.  Suspect large  peroneal tenosynovial fluid collection which could be septic or reactive.  Circumvents potential subcutaneous edema throughout the lower leg ankle and dorsal foot which may reflect cellulitis.   Patient was seen by orthopedics.  Patient declined surgical. Intervention.  Left ankle joint aspiration on 6/14 was , no crystals, 35,000, neutrophil 99%.  Cultures with no organisms and gram stain and no growth in cultures.   ROS: General- Denies fever, chills, loss of appetite and loss of weight HEENT - Denies headache, blurry vision, neck  pain, sinus pain Chest - Denies any chest pain, SOB or cough CVS- Denies any dizziness/lightheadedness, syncopal attacks, palpitations Abdomen- Denies any nausea, vomiting, abdominal pain, hematochezia and diarrhea Neuro - Denies any weakness, numbness, tingling sensation Psych - Denies any changes in mood irritability or depressive symptoms GU- Denies any burning, dysuria, hematuria or increased frequency of urination Skin - denies any rashes/lesions MSK - LEFT ANKLE PAIN AND SWELLING   Past Medical History:  Diagnosis Date   Arthritis    Class 1 obesity 7/0/7867   Complication of anesthesia    slow to wake up   Essential hypertension 07/18/2020   Hypertension    Hypertriglyceridemia    Numbness of fingers of both hands    Prostate cancer ([place]) 2017   Sleep apnea    CPAP   Past Surgical History:  Procedure Laterality Date   COLONOSCOPY WITH PROPOFOL N/A 09/28/2017   Procedure: COLONOSCOPY WITH PROPOFOL;  Surgeon: Daneil Dolin, MD;  Location: [facility];  Service: Endoscopy;  Laterality: N/A;  12:00pm   HAND SURGERY Right 2003   cysts    JOINT REPLACEMENT     LESION REMOVAL Left 09/23/2013   Procedure: MINOR EXCISION 3 CM SKIN LESION OF LEFT THIGH ;  Surgeon: Jamesetta So, MD;  Location: [facility] ORS;  Service: General;  Laterality: Left;   LYMPHADENECTOMY Bilateral 11/29/2015   Procedure: LYMPHADENECTOMY;  Surgeon: Raynelle Bring, MD;  Location: [facility] ORS;  Service: Urology;  Laterality: Bilateral;   ROBOT ASSISTED LAPAROSCOPIC RADICAL PROSTATECTOMY N/A 11/29/2015   Procedure: XI ROBOTIC ASSISTED LAPAROSCOPIC RADICAL PROSTATECTOMY LEVEL 3;  Surgeon: Raynelle Bring, MD;  Location: [facility] ORS;  Service: Urology;  Laterality: N/A;   TOTAL KNEE ARTHROPLASTY Left 02/22/2014   Procedure: LEFT TOTAL KNEE ARTHROPLASTY;  Surgeon: Tobi Bastos, MD;  Location: [facility] ORS;  Service: Orthopedics;  Laterality: Left;   TOTAL KNEE ARTHROPLASTY Right 05/23/2014   Procedure: RIGHT TOTAL KNEE ARTHROPLASTY;   Surgeon: Latanya Maudlin, MD;  Location: [facility] ORS;  Service: Orthopedics;  Laterality: Right;     Scheduled Meds:  (feeding supplement) PROSource Plus  30 mL Oral TID BM   allopurinol  300 mg Oral Daily   cyanocobalamin  1,000 mcg Intramuscular Q30 days   enoxaparin (LOVENOX) injection  40 mg Subcutaneous Daily   feeding supplement  237 mL Oral Q24H   hydrALAZINE  25 mg Oral Q6H   methocarbamol  500 mg Oral TID   multivitamin with minerals  1 tablet Oral Daily   nutrition supplement (JUVEN)  1 packet Oral BID BM   vitamin B-12  1,000 mcg Oral Daily   Continuous Infusions:  piperacillin-tazobactam (ZOSYN)  IV 3.375 g (07/28/20 0549)   vancomycin 1,500 mg (07/28/20 0033)   PRN Meds:.acetaminophen **OR** acetaminophen, HYDROmorphone (DILAUDID) injection, oxyCODONE-acetaminophen   Allergies:  Social History   Socioeconomic History   Marital status: Single    Spouse name: Not on file   Number of children: Not on file   Years of  education: Not on file   Highest education level: Not on file  Occupational History   Occupation: disability  Tobacco Use   Smoking status: Never   Smokeless tobacco: Never  Vaping Use   Vaping Use: Never used  Substance and Sexual Activity   Alcohol use: Yes    Comment: OCCASIONAL   Drug use: No   Sexual activity: Yes    Birth control/protection: None  Other Topics Concern   Not on file  Social History Narrative   Not on file   Social Determinants of Health   Financial Resource Strain: Not on file  Food Insecurity: Not on file  Transportation Needs: Not on file  Physical Activity: Not on file  Stress: Not on file  Social Connections: Not on file  Intimate Partner Violence: Not on file   Family:  Vitals BP (!) 164/94 (BP Location: Right Arm)   Pulse 79   Temp 98.7 F (37.1 C) (Oral)   Resp 18   Ht $R'6\' 3"'YG$  (1.905 m)   Wt 114.2 kg   SpO2 92%   BMI 31.47 kg/m    Physical Exam Constitutional: Sitting up in bed, not in acute  distress, irritable    Comments:   Cardiovascular:     Rate and Rhythm: Normal rate and regular rhythm.     Heart sounds: No murmur heard.  Soft systolic murmur  Pulmonary:     Effort: Pulmonary effort is normal.     Comments: Bilateral clear air entry  Abdominal:     Palpations: Abdomen is soft.     Tenderness: Nontender and nondistended  Musculoskeletal:        General: Left ankle swelling and minimal erythema.  Somewhat restricted range of motion in the left ankle  Skin:    Comments: No lesions or rashes  Neurological:     General: No focal deficit present.   Psychiatric:        Mood and Affect: Mood normal.    Pertinent Microbiology Results for orders placed or performed during the hospital encounter of 07/23/20  Urine culture     Status: Abnormal   Collection Time: 07/23/20  5:49 PM   Specimen: In/Out Cath Urine  Result Value Ref Range Status   Specimen Description IN/OUT CATH URINE  Final   Special Requests   Final    NONE Performed at [facility], [address], [place], [place] [address]    Culture MULTIPLE SPECIES PRESENT, SUGGEST RECOLLECTION (A)  Final   Report Status 07/26/2020 FINAL  Final  Culture, blood (Routine x 2)     Status: None (Preliminary result)   Collection Time: 07/23/20  6:19 PM   Specimen: BLOOD  Result Value Ref Range Status   Specimen Description BLOOD RIGHT ANTECUBITAL  Final   Special Requests   Final    BOTTLES DRAWN AEROBIC ONLY Blood Culture adequate volume   Culture   Final    NO GROWTH 4 DAYS Performed at [facility], [place] [address], [place], [place] [address]    Report Status PENDING  Incomplete  Culture, blood (Routine x 2)     Status: None (Preliminary result)   Collection Time: 07/23/20  6:19 PM   Specimen: BLOOD  Result Value Ref Range Status   Specimen Description BLOOD LEFT ANTECUBITAL  Final   Special Requests   Final    BOTTLES DRAWN AEROBIC ONLY Blood Culture results may not be optimal due to an  inadequate volume of blood received  in culture bottles   Culture   Final    NO GROWTH 4 DAYS Performed at [facility], [place] [address], [place], [place] [address]    Report Status PENDING  Incomplete  Resp Panel by RT-PCR (Flu A&B, Covid) Nasopharyngeal Swab     Status: None   Collection Time: 07/23/20 10:44 PM   Specimen: Nasopharyngeal Swab; Nasopharyngeal(NP) swabs in vial transport medium  Result Value Ref Range Status   SARS Coronavirus 2 by RT PCR NEGATIVE NEGATIVE Final    Comment: (NOTE) SARS-CoV-2 target nucleic acids are NOT DETECTED.  The SARS-CoV-2 RNA is generally detectable in upper respiratory specimens during the acute phase of infection. The lowest concentration of SARS-CoV-2 viral copies this assay can detect is 138 copies/mL. A negative result does not preclude SARS-Cov-2 infection and should not be used as the sole basis for treatment or other patient management decisions. A negative result may occur with  improper specimen collection/handling, submission of specimen other than nasopharyngeal swab, presence of viral mutation(s) within the areas targeted by this assay, and inadequate number of viral copies(<138 copies/mL). A negative result must be combined with clinical observations, patient history, and epidemiological information. The expected result is Negative.  Fact Sheet for Patients:  EntrepreneurPulse.com.au  Fact Sheet for Healthcare Providers:  IncredibleEmployment.be  This test is no t yet approved or cleared by the [place] FDA and  has been authorized for detection and/or diagnosis of SARS-CoV-2 by FDA under an Emergency Use Authorization (EUA). This EUA will remain  in effect (meaning this test can be used) for the duration of the COVID-19 declaration under Section 564(b)(1) of the Act, 21 U.S.C.section 360bbb-3(b)(1), unless the authorization is terminated  or revoked sooner.       Influenza A  by PCR NEGATIVE NEGATIVE Final   Influenza B by PCR NEGATIVE NEGATIVE Final    Comment: (NOTE) The Xpert Xpress SARS-CoV-2/FLU/RSV plus assay is intended as an aid in the diagnosis of influenza from Nasopharyngeal swab specimens and should not be used as a sole basis for treatment. Nasal washings and aspirates are unacceptable for Xpert Xpress SARS-CoV-2/FLU/RSV testing.  Fact Sheet for Patients: EntrepreneurPulse.com.au  Fact Sheet for Healthcare Providers: IncredibleEmployment.be  This test is not yet approved or cleared by the [place] FDA and has been authorized for detection and/or diagnosis of SARS-CoV-2 by FDA under an Emergency Use Authorization (EUA). This EUA will remain in effect (meaning this test can be used) for the duration of the COVID-19 declaration under Section 564(b)(1) of the Act, 21 U.S.C. section 360bbb-3(b)(1), unless the authorization is terminated or revoked.  Performed at [facility], [place] [address] [place], [place] [address]   Body fluid culture w Gram Stain     Status: None   Collection Time: 07/24/20 12:27 AM   Specimen: Body Fluid  Result Value Ref Range Status   Specimen Description FLUID ANKLE  Final   Special Requests NONE  Final   Gram Stain   Final    WBC PRESENT, PREDOMINANTLY MONONUCLEAR NO ORGANISMS SEEN CYTOSPIN SMEAR    Culture   Final    NO GROWTH 3 DAYS Performed at [facility], [address], [place], [place] [address]    Report Status 07/27/2020 FINAL  Final    Pertinent Lab seen by me: CBC Latest Ref Rng & Units 07/28/2020 07/27/2020 07/26/2020  WBC 4.0 - 10.5 K/uL 9.6 9.7 8.6  Hemoglobin 13.0 - 17.0 g/dL 10.3(L) 9.4(L) 8.9(L)  Hematocrit  39.0 - 52.0 % 32.4(L) 30.8(L) 29.6(L)  Platelets 150 - 400 K/uL 449(H) 442(H) 358   CMP Latest Ref Rng & Units 07/28/2020 07/27/2020 07/26/2020  Glucose 70 - 99 mg/dL 109(H) 103(H) 117(H)  BUN 6 - 20 mg/dL $Remove'7 6 8  '[id number]$ Creatinine 0.61 -  1.24 mg/dL 0.69 0.66 0.60(L)  Sodium 135 - 145 mmol/L 139 141 138  Potassium 3.5 - 5.1 mmol/L 3.8 3.6 3.5  Chloride 98 - 111 mmol/L 98 100 96(L)  CO2 22 - 32 mmol/L 31 34(H) 35(H)  Calcium 8.9 - 10.3 mg/dL 9.0 8.8(L) 8.5(L)  Total Protein 6.5 - 8.1 g/dL 6.7 - -  Total Bilirubin 0.3 - 1.2 mg/dL 0.5 - -  Alkaline Phos 38 - 126 U/L 53 - -  AST 15 - 41 U/L 18 - -  ALT 0 - 44 U/L 17 - -     Pertinent Imagings/Other Imagings Plain films and CT images have been personally visualized and interpreted; radiology reports have been reviewed. Decision making incorporated into the Impression / Recommendations.  CT LE WO contrast 07/23/20 IMPRESSION: 1. Findings suggestive of septic arthritis of the left ankle with large complex tibiotalar joint effusion containing debris. New erosions centered at the subtalar and talonavicular joints also suspicious for septic arthritis. Orthopedic surgery consultation recommended. 2. Suspect large peroneal tenosynovial fluid collection which could be septic or reactive. 3. Circumferential subcutaneous edema throughout the lower leg, ankle, and dorsal foot, which may reflect cellulitis. No soft tissue gas. 4. Status post left total knee arthroplasty without evidence of periprosthetic fracture or hardware loosening.    I have spent more than 70  minutes for this patient encounter including review of prior medical records with greater than 50% of time being face to face and coordination of their care.  Electronically signed by:   Rosiland Oz, MD Infectious Disease Physician [facility] for Infectious Disease Pager: [phone]

## 2020-07-28 NOTE — TOC Initial Note (Addendum)
Transition of Care [facility]) - Initial/Assessment Note    Patient Details  Name: Andrew Haley MRN: [id number] Date of Birth: September 30, 1965  Transition of Care [facility]) CM/SW Contact:    Norina Buzzard, RN Phone Number: 07/28/2020, 3:25 PM  Clinical Narrative:  Pt admitted with cellulitis with possible septic arthritis of L ankle.   Received call from the pharmacist to assist with [facility] IV abx (ceftriaxone and daptomycin) for 4 weeks. Informed pharmacist that we are not able to check for insurance coverage for drugs on the weekends.  Contacted Pam with [facility]. She reports that they won't be able to check for pt's insurance for abx coverage until Monday. She will start the process, but pt may not be able to be D/C until Monday.   Met with pt. Pt lives alone. He plans to stay with some friends after he is D/C. He reports that they can assist with the administration of the IV abx. He doesn't know their address. He reports that they will be here tomorrow. Informed pt that we need to provide the address to the [facility] agency. Informed pt that [facility] has a contract with [employer] and he agrees with agency. Contacted Georgina Snell with [facility] for [facility] referral and he accepted it.  He reports that he drives. He has a PCP. He denies any issues filling his prescriptions. He has a RW. He reports that he doesn't need a BSC.  Pt needs HH orders. Will continue to f/u to assist with the D/C plan.    Expected Discharge Plan: [place]     Patient Goals and CMS Choice Patient states their goals for this hospitalization and ongoing recovery are:: to go home      Expected Discharge Plan and Services Expected Discharge Plan: [place]   Discharge Planning Services: CM Consult Post Acute Care Choice: [place] arrangements for the past 2 months: Single Family Home                           HH Arranged: RN, IV Antibiotics HH Agency: [facility], [facility] Date [facility] Agency Contacted: 07/28/20 Time HH Agency Contacted: 34 Representative spoke with at [place]: Georgina Snell  Prior Living Arrangements/Services Living arrangements for the past 2 months: [place] with:: Self Patient language and need for interpreter reviewed:: Yes Do you feel safe going back to the place where you live?: Yes               Activities of Daily Living Home Assistive Devices/Equipment: CPAP ADL Screening (condition at time of admission) Patient's cognitive ability adequate to safely complete daily activities?: Yes Is the patient deaf or have difficulty hearing?: No Does the patient have difficulty seeing, even when wearing glasses/contacts?: No Does the patient have difficulty concentrating, remembering, or making decisions?: No Patient able to express need for assistance with ADLs?: Yes Does the patient have difficulty dressing or bathing?: No Independently performs ADLs?: Yes (appropriate for developmental age) Does the patient have difficulty walking or climbing stairs?: No Weakness of Legs: Left Weakness of Arms/Hands: None  Permission Sought/Granted Permission sought to share information with : Case Manager                Emotional Assessment Appearance:: Appears stated age Attitude/Demeanor/Rapport: Engaged, Self-Confident Affect (typically observed): Accepting, Calm, Pleasant Orientation: : Oriented to Self, Oriented to Place, Oriented to  Time,  Oriented to Situation      Admission diagnosis:  Septic arthritis ([place]) [M00.9] Sepsis ([place]) [A41.9] Cellulitis, unspecified cellulitis site [L03.90] Septic arthritis of left ankle, due to unspecified organism [facility]) [M00.9] Patient Active Problem List   Diagnosis Date Noted   Septic arthritis ([place]) 07/23/2020   Chronic pain syndrome    Cellulitis of left lower extremity 07/18/2020   Normocytic anemia 07/18/2020   Hypocalcemia 07/18/2020   Essential  hypertension 07/18/2020   Class 1 obesity 07/18/2020   Encounter for screening colonoscopy 07/30/2017   Prostate cancer ([place]) 11/29/2015   Postoperative anemia due to acute blood loss 05/25/2014   History of total knee arthroplasty 05/23/2014   H/O total knee replacement 02/22/2014   PCP:  Redmond School, MD Pharmacy:   [facility], [place] [address] Phone: [phone] Fax: [phone]     Social Determinants of Health (SDOH) Interventions    Readmission Risk Interventions No flowsheet data found.

## 2020-07-28 NOTE — Progress Notes (Signed)
Peripherally Inserted Central Catheter Placement  The IV Nurse has discussed with the patient and/or persons authorized to consent for the patient, the purpose of this procedure and the potential benefits and risks involved with this procedure.  The benefits include less needle sticks, lab draws from the catheter, and the patient may be discharged home with the catheter. Risks include, but not limited to, infection, bleeding, blood clot (thrombus formation), and puncture of an artery; nerve damage and irregular heartbeat and possibility to perform a PICC exchange if needed/ordered by physician.  Alternatives to this procedure were also discussed.  Bard Power PICC patient education guide, fact sheet on infection prevention and patient information card has been provided to patient /or left at bedside.    PICC Placement Documentation  PICC Single Lumen 07/28/20 Right Brachial 43 cm 0 cm (Active)  Indication for Insertion or Continuance of Line Home intravenous therapies (PICC only) 07/28/20 1405  Exposed Catheter (cm) 0 cm 07/28/20 1405  Site Assessment Clean;Dry;Intact 07/28/20 1405  Line Status Flushed;Saline locked;Blood return noted 07/28/20 1405  Dressing Type Transparent 07/28/20 1405  Dressing Status Clean;Dry;Intact 07/28/20 1405  Antimicrobial disc in place? Yes 07/28/20 1405  Safety Lock Not Applicable 57/47/34 0370  Line Care Tubing changed;Connections checked and tightened 07/28/20 1405  Line Adjustment (NICU/IV Team Only) No 07/28/20 1405  Dressing Intervention New dressing 07/28/20 1405  Dressing Change Due 08/04/20 07/28/20 1405       Rolena Infante 07/28/2020, 2:06 PM

## 2020-07-28 NOTE — Progress Notes (Addendum)
Pharmacy Antibiotic Note  Andrew Haley is a 55 y.o. male admitted on 07/23/2020 with cellulitis with possible septic arthritis of L ankle.  Pharmacy has been consulted for ceftriaxone and daptomycin; previously on vancomycin and Zosyn.  ID: Cellulitis with possible septic arthritis of L ankle s/p arthrocentesis. - CT: suggestive of septic arthritis of L ankle - Afeb. WBC WNL - Ortho consulted - holding off on I&D unless cx positive, likely needs BKA for pain elimination but pt not ammenable - ID recommended ceftriaxone + dapto for 4 weeks for d/c if patient can afford  Vanc 6/13 >> 6/18 Zosyn 6/14 >> 6/18 Ceftriaxone 6/18 >> Daptomycin 6/18 >>  613 bcx: NGTD 6/13 ucx: neg 6/14 ankle fluid: neg  Plan: Ceftriaxone 2 g IV q24h Daptomycin 900 mg IV q24h (~8 mg/kg) CK at baseline and weekly Monitor renal function TOC CM checking with [facility] to see if abx can be set up over the weekend - this is unable to be done over the weekend, CM to follow-up on Monday    Height: 6\' 3"  (190.5 cm) Weight: 114.2 kg (251 lb 12.3 oz) IBW/kg (Calculated) : 84.5  Temp (24hrs), Avg:98.4 F (36.9 C), Min:98 F (36.7 C), Max:98.7 F (37.1 C)  Recent Labs  Lab 07/23/20 1749 07/23/20 1847 07/23/20 2251 07/24/20 0512 07/24/20 1108 07/24/20 2011 07/25/20 0231 07/26/20 0330 07/26/20 1527 07/27/20 0013 07/27/20 1037 07/28/20 0327  WBC  --    < >  --  10.9*  --   --  7.2 8.6  --  9.7  --  9.6  CREATININE  --    < >  --  0.76  --   --  0.64 0.60*  --  0.66  --  0.69  LATICACIDVEN 0.9  --  1.1  --  0.8 1.3  --   --   --   --   --   --   VANCOTROUGH  --   --   --   --   --   --   --   --   --  24* 11*  --   VANCOPEAK  --   --   --   --   --   --   --   --  28*  --   --   --    < > = values in this interval not displayed.     Estimated Creatinine Clearance: 140.6 mL/min (by C-G formula based on SCr of 0.69 mg/dL).    No Known Allergies  Thank you for involving pharmacy in this  patient's care.  Renold Genta, PharmD, BCPS Clinical Pharmacist Clinical phone for 07/28/2020 until 3p is [phone] 07/28/2020 11:51 AM  **Pharmacist phone directory can be found on [email or web address] listed under [place]**

## 2020-07-28 NOTE — Progress Notes (Signed)
PROGRESS NOTE    Andrew Haley  MWU:[id number] DOB: [birth year] DOA: 07/23/2020 PCP: Redmond School, MD   Brief Narrative:   Andrew Haley is a 55 y.o. male with a history of chronic opioid-dependent low back pain, post-traumatic severe left ankle arthritis, gout, obesity, OSA on CPAP, localized prostate CA s/p prostatectomy who was admitted at [facility] 6/8 - 6/9 treated for LLE cellulitis discharged on doxycycline and returned to [facility] 6/13 with left ankle pain and swelling, tachycardia, leukocytosis (WBC 12.2k), and low grade fever with CT findings of joint effusion and bony erosions of the distal tibia, talus, dorsal calcaneus concerning for for septic arthritis. IV antibiotics were started and the patient was admitted with orthopedics consulting. Arthrocentesis revealed turbid fluid with 35k WBCs (99% PMNs), negative gram stain.  6/18: Reports that he feels better today. Says that after another conversation with ortho, will continue course with abx treatment and follow up with ortho in outpt setting. No acute surgical intervention planned. Have consulted ID for abx rec's. Culture data is negative thus far.   Assessment & Plan: Sepsis due to LLE cellulitis with concern for septic ankle arthritis in patient with posttraumatic left ankle arthritis w/hardware     - WBC normalized, LA reassuring.     - Continue vancomycin, zosyn, tailor to culture data. Blood cultures 6/13 remain NGTD. Arthrocentesis culture from 6/14 NGTD.     - Pt declining operative management.     - Continue pain control with primarily po oxycodone which is home medication. Pt is opioid tolerant.     - History of gout, no crystals in synovial fluid. Will continue allopurinol.     - PT/OT. Was unable to bear any weight on LLE at time of evaluations.  Will be going home with home health.       - Patient requested discussion again with orthopedics (d/w otho, pt and ortho agree on now surgical intervention right now) and since there is  concern for septic arthritis; ID was consulted       - Patient reported severe pain right now as well as muscle spasm; Robaxin was added.     - 6/18: Today he is feeling better. Seems eager to go home. He discussed case w/ ortho yesterday. They agree no surgical intervention for now. ID consulted for abx recs. Cx remain NTGD. If IV abx needed, will need PICC placement.    OSA:     - Continue CPAP.   Anemia:      - Appears chronic; Hgb stable at 10.3; follow  B12 deficiency: Level is 71.     - Supplement ordered IM; continue as PO after   Obesity     - BMI 31.     - counsel on diet, lifestyle changes; follow up outpt   Hypoalbuminemia     - Dietitian consulted; follow recs   Hypokalemia     - resolved; follow   History of prostate CA     - s/p prostatectomy.     - follow up outpt  DVT prophylaxis: lovenox Code Status: FULL Family Communication: None at bedside   Status is: Inpatient  Remains inpatient appropriate because:Unsafe d/c plan  Dispo: The patient is from: Home              Anticipated d/c is to: Home              Patient currently is medically stable to d/c.   Difficult to place patient No  Consultants:  Ortho ID  Procedures:  None  Antimicrobials:  Vanc, zosyn   Subjective: Doing better this AM. Eager to go home.  Objective: Vitals:   07/27/20 1924 07/27/20 2315 07/28/20 0334 07/28/20 0711  BP: (!) 145/76 (!) 172/93 (!) 151/77 (!) 164/94  Pulse: 83 76 82 79  Resp:  20 20 18   Temp: 98.6 F (37 C) 98.5 F (36.9 C) 98.2 F (36.8 C) 98.7 F (37.1 C)  TempSrc:    Oral  SpO2: 95% 95% 95% 92%  Weight:      Height:        Intake/Output Summary (Last 24 hours) at 07/28/2020 0719 Last data filed at 07/28/2020 0240 Gross per 24 hour  Intake 1280 ml  Output 6900 ml  Net -5620 ml   Filed Weights   07/23/20 1747 07/24/20 2015  Weight: 115.7 kg 114.2 kg    Examination:  General: 55 y.o. male resting in bed in NAD Eyes: PERRL, normal  sclera ENMT: Nares patent w/o discharge, orophaynx clear, dentition normal, ears w/o discharge/lesions/ulcers Neck: Supple, trachea midline Cardiovascular: RRR, +S1, S2, no m/g/r, equal pulses throughout Respiratory: CTABL, no w/r/r, normal WOB GI: BS+, NDNT, no masses noted, no organomegaly noted MSK: No c/c; LLE edema, erythema Skin: No rashes, bruises, ulcerations noted Neuro: A&O x 3, no focal deficits Psyc: Appropriate interaction and affect, calm/cooperative   Data Reviewed: I have personally reviewed following labs and imaging studies.  CBC: Recent Labs  Lab 07/23/20 1847 07/24/20 0512 07/25/20 0231 07/26/20 0330 07/27/20 0013 07/28/20 0327  WBC 12.2* 10.9* 7.2 8.6 9.7 9.6  NEUTROABS 10.3*  --  5.3  --   --  6.6  HGB 10.5* 9.4* 9.1* 8.9* 9.4* 10.3*  HCT 33.8* 30.4* 29.4* 29.6* 30.8* 32.4*  MCV 82.4 83.3 83.5 83.9 83.5 81.2  PLT 428* 336 304 358 442* 973*   Basic Metabolic Panel: Recent Labs  Lab 07/24/20 0512 07/25/20 0231 07/26/20 0330 07/27/20 0013 07/28/20 0327  NA 134* 138 138 141 139  K 3.1* 3.3* 3.5 3.6 3.8  CL 93* 96* 96* 100 98  CO2 30 34* 35* 34* 31  GLUCOSE 127* 100* 117* 103* 109*  BUN 13 11 8 6 7   CREATININE 0.76 0.64 0.60* 0.66 0.69  CALCIUM 8.7* 8.3* 8.5* 8.8* 9.0  MG  --   --  1.8  --   --    GFR: Estimated Creatinine Clearance: 140.6 mL/min (by C-G formula based on SCr of 0.69 mg/dL). Liver Function Tests: Recent Labs  Lab 07/23/20 1847 07/25/20 0231 07/28/20 0327  AST 16 12* 18  ALT 15 14 17   ALKPHOS 81 48 53  BILITOT 0.5 0.3 0.5  PROT 8.4* 5.7* 6.7  ALBUMIN 2.7* 1.9* 2.2*   No results for input(s): LIPASE, AMYLASE in the last 168 hours. No results for input(s): AMMONIA in the last 168 hours. Coagulation Profile: Recent Labs  Lab 07/23/20 1847  INR 1.1   Cardiac Enzymes: No results for input(s): CKTOTAL, CKMB, CKMBINDEX, TROPONINI in the last 168 hours. BNP (last 3 results) No results for input(s): PROBNP in the last  8760 hours. HbA1C: No results for input(s): HGBA1C in the last 72 hours. CBG: Recent Labs  Lab 07/25/20 1722 07/25/20 2313 07/26/20 1200 07/26/20 2325 07/27/20 0716  GLUCAP 104* 106* 137* 95 103*   Lipid Profile: No results for input(s): CHOL, HDL, LDLCALC, TRIG, CHOLHDL, LDLDIRECT in the last 72 hours. Thyroid Function Tests: No results for input(s): TSH, T4TOTAL, FREET4, T3FREE, THYROIDAB in the last  72 hours. Anemia Panel: Recent Labs    07/26/20 0330  VITAMINB12 71*  FOLATE 9.5  FERRITIN 108  TIBC 207*  IRON 17*   Sepsis Labs: Recent Labs  Lab 07/23/20 1749 07/23/20 2251 07/24/20 1108 07/24/20 2011  LATICACIDVEN 0.9 1.1 0.8 1.3    Recent Results (from the past 240 hour(s))  Resp Panel by RT-PCR (Flu A&B, Covid) Nasopharyngeal Swab     Status: None   Collection Time: 07/18/20  5:56 PM   Specimen: Nasopharyngeal Swab; Nasopharyngeal(NP) swabs in vial transport medium  Result Value Ref Range Status   SARS Coronavirus 2 by RT PCR NEGATIVE NEGATIVE Final    Comment: (NOTE) SARS-CoV-2 target nucleic acids are NOT DETECTED.  The SARS-CoV-2 RNA is generally detectable in upper respiratory specimens during the acute phase of infection. The lowest concentration of SARS-CoV-2 viral copies this assay can detect is 138 copies/mL. A negative result does not preclude SARS-Cov-2 infection and should not be used as the sole basis for treatment or other patient management decisions. A negative result may occur with  improper specimen collection/handling, submission of specimen other than nasopharyngeal swab, presence of viral mutation(s) within the areas targeted by this assay, and inadequate number of viral copies(<138 copies/mL). A negative result must be combined with clinical observations, patient history, and epidemiological information. The expected result is Negative.  Fact Sheet for Patients:  EntrepreneurPulse.com.au  Fact Sheet for Healthcare  Providers:  IncredibleEmployment.be  This test is no t yet approved or cleared by the [place] FDA and  has been authorized for detection and/or diagnosis of SARS-CoV-2 by FDA under an Emergency Use Authorization (EUA). This EUA will remain  in effect (meaning this test can be used) for the duration of the COVID-19 declaration under Section 564(b)(1) of the Act, 21 U.S.C.section 360bbb-3(b)(1), unless the authorization is terminated  or revoked sooner.       Influenza A by PCR NEGATIVE NEGATIVE Final   Influenza B by PCR NEGATIVE NEGATIVE Final    Comment: (NOTE) The Xpert Xpress SARS-CoV-2/FLU/RSV plus assay is intended as an aid in the diagnosis of influenza from Nasopharyngeal swab specimens and should not be used as a sole basis for treatment. Nasal washings and aspirates are unacceptable for Xpert Xpress SARS-CoV-2/FLU/RSV testing.  Fact Sheet for Patients: EntrepreneurPulse.com.au  Fact Sheet for Healthcare Providers: IncredibleEmployment.be  This test is not yet approved or cleared by the [place] FDA and has been authorized for detection and/or diagnosis of SARS-CoV-2 by FDA under an Emergency Use Authorization (EUA). This EUA will remain in effect (meaning this test can be used) for the duration of the COVID-19 declaration under Section 564(b)(1) of the Act, 21 U.S.C. section 360bbb-3(b)(1), unless the authorization is terminated or revoked.  Performed at [facility], [address], [place], [place] [address]   Urine culture     Status: Abnormal   Collection Time: 07/23/20  5:49 PM   Specimen: In/Out Cath Urine  Result Value Ref Range Status   Specimen Description IN/OUT CATH URINE  Final   Special Requests   Final    NONE Performed at [facility], [place] [address], [place], [place] [address]    Culture MULTIPLE SPECIES PRESENT, SUGGEST RECOLLECTION (A)  Final   Report Status 07/26/2020 FINAL   Final  Culture, blood (Routine x 2)     Status: None (Preliminary result)   Collection Time: 07/23/20  6:19 PM   Specimen: BLOOD  Result Value Ref Range Status   Specimen  Description BLOOD RIGHT ANTECUBITAL  Final   Special Requests   Final    BOTTLES DRAWN AEROBIC ONLY Blood Culture adequate volume   Culture   Final    NO GROWTH 4 DAYS Performed at [facility], [address], [place], [place] [address]    Report Status PENDING  Incomplete  Culture, blood (Routine x 2)     Status: None (Preliminary result)   Collection Time: 07/23/20  6:19 PM   Specimen: BLOOD  Result Value Ref Range Status   Specimen Description BLOOD LEFT ANTECUBITAL  Final   Special Requests   Final    BOTTLES DRAWN AEROBIC ONLY Blood Culture results may not be optimal due to an inadequate volume of blood received in culture bottles   Culture   Final    NO GROWTH 4 DAYS Performed at [facility], [place] [address], [place], [place] [address]    Report Status PENDING  Incomplete  Resp Panel by RT-PCR (Flu A&B, Covid) Nasopharyngeal Swab     Status: None   Collection Time: 07/23/20 10:44 PM   Specimen: Nasopharyngeal Swab; Nasopharyngeal(NP) swabs in vial transport medium  Result Value Ref Range Status   SARS Coronavirus 2 by RT PCR NEGATIVE NEGATIVE Final    Comment: (NOTE) SARS-CoV-2 target nucleic acids are NOT DETECTED.  The SARS-CoV-2 RNA is generally detectable in upper respiratory specimens during the acute phase of infection. The lowest concentration of SARS-CoV-2 viral copies this assay can detect is 138 copies/mL. A negative result does not preclude SARS-Cov-2 infection and should not be used as the sole basis for treatment or other patient management decisions. A negative result may occur with  improper specimen collection/handling, submission of specimen other than nasopharyngeal swab, presence of viral mutation(s) within the areas targeted by this assay, and inadequate number of  viral copies(<138 copies/mL). A negative result must be combined with clinical observations, patient history, and epidemiological information. The expected result is Negative.  Fact Sheet for Patients:  EntrepreneurPulse.com.au  Fact Sheet for Healthcare Providers:  IncredibleEmployment.be  This test is no t yet approved or cleared by the [place] FDA and  has been authorized for detection and/or diagnosis of SARS-CoV-2 by FDA under an Emergency Use Authorization (EUA). This EUA will remain  in effect (meaning this test can be used) for the duration of the COVID-19 declaration under Section 564(b)(1) of the Act, 21 U.S.C.section 360bbb-3(b)(1), unless the authorization is terminated  or revoked sooner.       Influenza A by PCR NEGATIVE NEGATIVE Final   Influenza B by PCR NEGATIVE NEGATIVE Final    Comment: (NOTE) The Xpert Xpress SARS-CoV-2/FLU/RSV plus assay is intended as an aid in the diagnosis of influenza from Nasopharyngeal swab specimens and should not be used as a sole basis for treatment. Nasal washings and aspirates are unacceptable for Xpert Xpress SARS-CoV-2/FLU/RSV testing.  Fact Sheet for Patients: EntrepreneurPulse.com.au  Fact Sheet for Healthcare Providers: IncredibleEmployment.be  This test is not yet approved or cleared by the [place] FDA and has been authorized for detection and/or diagnosis of SARS-CoV-2 by FDA under an Emergency Use Authorization (EUA). This EUA will remain in effect (meaning this test can be used) for the duration of the COVID-19 declaration under Section 564(b)(1) of the Act, 21 U.S.C. section 360bbb-3(b)(1), unless the authorization is terminated or revoked.  Performed at [facility], [place] [address], [place], [place] [address]   Body fluid culture w Gram Stain  Status: None   Collection Time: 07/24/20 12:27 AM   Specimen: Body Fluid   Result Value Ref Range Status   Specimen Description FLUID ANKLE  Final   Special Requests NONE  Final   Gram Stain   Final    WBC PRESENT, PREDOMINANTLY MONONUCLEAR NO ORGANISMS SEEN CYTOSPIN SMEAR    Culture   Final    NO GROWTH 3 DAYS Performed at [facility], [address] [place], [place] [address]    Report Status 07/27/2020 FINAL  Final      Radiology Studies: No results found.   Scheduled Meds:  (feeding supplement) PROSource Plus  30 mL Oral TID BM   allopurinol  300 mg Oral Daily   cyanocobalamin  1,000 mcg Intramuscular Q30 days   enoxaparin (LOVENOX) injection  40 mg Subcutaneous Daily   feeding supplement  237 mL Oral Q24H   hydrALAZINE  25 mg Oral Q6H   methocarbamol  500 mg Oral TID   multivitamin with minerals  1 tablet Oral Daily   nutrition supplement (JUVEN)  1 packet Oral BID BM   vitamin B-12  1,000 mcg Oral Daily   Continuous Infusions:  piperacillin-tazobactam (ZOSYN)  IV 3.375 g (07/28/20 0549)   vancomycin 1,500 mg (07/28/20 0033)     LOS: 5 days    Time spent: 25 minutes   Jonnie Finner, DO [facility]  If 7PM-7AM, please contact night-coverage [email or web address] 07/28/2020, 7:19 AM

## 2020-07-29 DIAGNOSIS — L03116 Cellulitis of left lower limb: Secondary | ICD-10-CM

## 2020-07-29 DIAGNOSIS — L0390 Cellulitis, unspecified: Secondary | ICD-10-CM

## 2020-07-29 DIAGNOSIS — C61 Malignant neoplasm of prostate: Secondary | ICD-10-CM

## 2020-07-29 DIAGNOSIS — I10 Essential (primary) hypertension: Secondary | ICD-10-CM | POA: Diagnosis not present

## 2020-07-29 DIAGNOSIS — M009 Pyogenic arthritis, unspecified: Secondary | ICD-10-CM | POA: Diagnosis not present

## 2020-07-29 MED ORDER — LOSARTAN POTASSIUM 50 MG PO TABS
50.0000 mg | ORAL_TABLET | Freq: Every day | ORAL | Status: DC
  Administered 2020-07-29 – 2020-07-30 (×2): 50 mg via ORAL
  Filled 2020-07-29 (×2): qty 1

## 2020-07-29 MED ORDER — LOSARTAN POTASSIUM-HCTZ 50-12.5 MG PO TABS: 1.0000 | ORAL_TABLET | Freq: Every day | ORAL | Status: DC

## 2020-07-29 MED ORDER — HYDROCHLOROTHIAZIDE 12.5 MG PO CAPS
12.5000 mg | ORAL_CAPSULE | Freq: Every day | ORAL | Status: DC
  Administered 2020-07-29 – 2020-07-30 (×2): 12.5 mg via ORAL
  Filled 2020-07-29 (×2): qty 1

## 2020-07-29 NOTE — Plan of Care (Signed)

## 2020-07-29 NOTE — Progress Notes (Signed)
PROGRESS NOTE    JAHKI WITHAM  TTS:[id number] DOB: 1965-12-18 DOA: 07/23/2020 PCP: Redmond School, MD    Brief Narrative:  Mr. Khachatryan was admitted to the hospital with the working diagnosis of left ankle septic arthritis and osteomyelitis, complicated with sepsis (present on admission).   55 year old male past medical history of hypertension, prostate cancer and sleep apnea who presented with left ankle pain and edema.  Recent hospitalization in the hospital for left ankle cellulitis, discharged on oral antibiotic therapy.  Unfortunately his symptoms worsened prompting him to come back to the hospital.  His blood pressure was 140/86, heart rate 103, respiratory rate 27, oxygen saturation 95%.  His lungs are clear to auscultation bilaterally, heart S1-S2, present, rhythmic, soft abdomen, positive left ankle edema and erythema.  Sodium 132, potassium 2.9, chloride 89, bicarb 33, glucose 105, BUN 15, creatinine 0.8.  White count 12.2, hemoglobin 10.5, hematocrit 33.8, platelets 428.  CT left lower extremity with changes suggesting septic arthritis of the left ankle with large complex DFU talar joint effusion containing debris's.  New erosions centered at the subtalar and talonavicular joints suspicious for septic arthritis.  Large perineal and tenosynovial fluid collection.  Circumferential subcutaneous edema throughout the lower leg, ankle and dorsal foot.  Status post left total knee arthroplasty without evidence of periprosthetic fracture or hardware loosening.  Patient was placed on broad-spectrum antibiotic therapy, orthopedic surgery and ID were consulted.  06/13 arthrocentesis with cultures no growth, 35,000 wbc with no crystals.   Patient was adamantly against any possibility of amputation.  Decision was made to continue IV antibiotic therapy and close follow-up as an outpatient.  A PICC line was placed 06/18   Assessment & Plan:   Principal Problem:   Septic arthritis ([place]) Active  Problems:   Prostate cancer [facility])   Essential hypertension   Cellulitis   Left ankle septic arthritis and osteomyelitis, complicated with sepsis (present on admission). Wbc is 9,6, patient has been afebrile and cultures continue with no growth.   Plan to continue with outpatient antibiotic therapy with daptomycin and ceftriaxone with end date 08/20/20.  Will need close follow up as outpatient per ID and orthopedics.  Pain is well controlled with oral analgesics and [place] lower extremity has been negative for deep vein thrombosis.   No signs of acute gout flare, continue with allopurinol.   2. Hypokalemia, Stable renal function and electrolytes have bee corrected.   Patient tolerating po well.  3. Obesity class 1 and OSA. Calculated Bs 31, continue with cpap.  4. Anemia of chronic disease/ B12 deficiency/ hypoalbuminemia Hgb is 10,3.  No frank malnutrition.   5. Hx of prostate cancer. Sp surgery, follow up as outpatient.   6. Uncontrolled HTN. Elevated blood pressure, will resume losartan and HCTZ. Continue blood pressure monitoring.   Status is: Inpatient  Remains inpatient appropriate because:IV treatments appropriate due to intensity of illness or inability to take PO  Dispo: The patient is from: Home              Anticipated d/c is to: Home              Patient currently is not medically stable to d/c. Need outpatient antibiotic therapy to be arranged.    Difficult to place patient No   DVT prophylaxis: Enoxaparin   Code Status:   full  Family Communication:  No family at the bedside      Nutrition Status: Nutrition Problem: Increased nutrient needs Etiology: wound healing Signs/Symptoms: estimated  needs Interventions: Ensure Enlive (each supplement provides 350kcal and 20 grams of protein), Prostat, MVI      Consultants:  Orthopedics ID   Procedures:  Arthrocentesis 06/13  Antimicrobials:  Daptomycin and ceftriaxone     Subjective: Patient is feeling  better, continue to have pain at the left ankle but improved in intensity, no nausea or vomiting, no chest pain or dyspnea,   Objective: Vitals:   07/29/20 0036 07/29/20 0331 07/29/20 0718 07/29/20 1131  BP: (!) 161/87 (!) 176/107 (!) 172/89 (!) 169/95  Pulse: 81 84 78 (!) 101  Resp: 19 16 16 16   Temp: 98.5 F (36.9 C) 98.9 F (37.2 C) 97.7 F (36.5 C) 97.7 F (36.5 C)  TempSrc: Oral Oral    SpO2: 98% 100% 98% 100%  Weight:      Height:        Intake/Output Summary (Last 24 hours) at 07/29/2020 1407 Last data filed at 07/29/2020 1130 Gross per 24 hour  Intake 10 ml  Output 5075 ml  Net -5065 ml   Filed Weights   07/23/20 1747 07/24/20 2015  Weight: 115.7 kg 114.2 kg    Examination:   General: Not in pain or dyspnea, deconditioned  Neurology: Awake and alert, non focal  E ENT: no pallor, no icterus, oral mucosa moist Cardiovascular: No JVD. S1-S2 present, rhythmic, no gallops, rubs, or murmurs. No lower extremity edema. Pulmonary: vesicular breath sounds bilaterally, adequate air movement, no wheezing, rhonchi or rales. Gastrointestinal. Abdomen soft and non tender Skin. No rashes Musculoskeletal: left ankle with local edema and tender to palpation, no local erythema.      Data Reviewed: I have personally reviewed following labs and imaging studies  CBC: Recent Labs  Lab 07/23/20 1847 07/24/20 0512 07/25/20 0231 07/26/20 0330 07/27/20 0013 07/28/20 0327  WBC 12.2* 10.9* 7.2 8.6 9.7 9.6  NEUTROABS 10.3*  --  5.3  --   --  6.6  HGB 10.5* 9.4* 9.1* 8.9* 9.4* 10.3*  HCT 33.8* 30.4* 29.4* 29.6* 30.8* 32.4*  MCV 82.4 83.3 83.5 83.9 83.5 81.2  PLT 428* 336 304 358 442* 742*   Basic Metabolic Panel: Recent Labs  Lab 07/24/20 0512 07/25/20 0231 07/26/20 0330 07/27/20 0013 07/28/20 0327  NA 134* 138 138 141 139  K 3.1* 3.3* 3.5 3.6 3.8  CL 93* 96* 96* 100 98  CO2 30 34* 35* 34* 31  GLUCOSE 127* 100* 117* 103* 109*  BUN 13 11 8 6 7   CREATININE 0.76 0.64  0.60* 0.66 0.69  CALCIUM 8.7* 8.3* 8.5* 8.8* 9.0  MG  --   --  1.8  --   --    GFR: Estimated Creatinine Clearance: 140.6 mL/min (by C-G formula based on SCr of 0.69 mg/dL). Liver Function Tests: Recent Labs  Lab 07/23/20 1847 07/25/20 0231 07/28/20 0327  AST 16 12* 18  ALT 15 14 17   ALKPHOS 81 48 53  BILITOT 0.5 0.3 0.5  PROT 8.4* 5.7* 6.7  ALBUMIN 2.7* 1.9* 2.2*   No results for input(s): LIPASE, AMYLASE in the last 168 hours. No results for input(s): AMMONIA in the last 168 hours. Coagulation Profile: Recent Labs  Lab 07/23/20 1847  INR 1.1   Cardiac Enzymes: Recent Labs  Lab 07/28/20 1044  CKTOTAL 27*   BNP (last 3 results) No results for input(s): PROBNP in the last 8760 hours. HbA1C: No results for input(s): HGBA1C in the last 72 hours. CBG: Recent Labs  Lab 07/25/20 1722 07/25/20 2313 07/26/20 1200 07/26/20  2325 07/27/20 0716  GLUCAP 104* 106* 137* 95 103*   Lipid Profile: No results for input(s): CHOL, HDL, LDLCALC, TRIG, CHOLHDL, LDLDIRECT in the last 72 hours. Thyroid Function Tests: No results for input(s): TSH, T4TOTAL, FREET4, T3FREE, THYROIDAB in the last 72 hours. Anemia Panel: No results for input(s): VITAMINB12, FOLATE, FERRITIN, TIBC, IRON, RETICCTPCT in the last 72 hours.    Radiology Studies: I have reviewed all of the imaging during this hospital visit personally     Scheduled Meds:  (feeding supplement) PROSource Plus  30 mL Oral TID BM   allopurinol  300 mg Oral Daily   Chlorhexidine Gluconate Cloth  6 each Topical Daily   cyanocobalamin  1,000 mcg Intramuscular Q30 days   enoxaparin (LOVENOX) injection  40 mg Subcutaneous Daily   feeding supplement  237 mL Oral Q24H   hydrALAZINE  25 mg Oral Q6H   methocarbamol  500 mg Oral TID   multivitamin with minerals  1 tablet Oral Daily   nutrition supplement (JUVEN)  1 packet Oral BID BM   sodium chloride flush  10-40 mL Intracatheter Q12H   vitamin B-12  1,000 mcg Oral Daily    Continuous Infusions:  cefTRIAXone (ROCEPHIN)  IV 2 g (07/29/20 1341)   DAPTOmycin (CUBICIN)  IV 900 mg (07/28/20 1733)     LOS: 6 days        Fadumo Heng Gerome Apley, MD

## 2020-07-29 NOTE — Progress Notes (Signed)
Diagnosis: septic arthritis   Culture Result: Culture negative  No Known Allergies  OPAT Orders Discharge antibiotics to be given via PICC line Discharge antibiotics: Daptomycin and ceftriaxone  Per pharmacy protocol Aim for Vancomycin trough 15-20 or AUC 400-550 (unless otherwise indicated) Duration: 4 weeks  End Date: 08/20/20   [facility] Care Per Protocol:  Home health RN for IV administration and teaching; PICC line care and labs.    Labs weekly while on IV antibiotics: _X_ CBC with differential __ BMP _X_ CMP X__ CRP _X_ ESR _X_ Vancomycin trough OR x_ CK  __ Please pull PIC at completion of IV antibiotics x_ Please leave PIC in place until doctor has seen patient or been notified  Fax weekly labs to [phone]  Clinic Follow Up Appt: 08/10/20  $Remov'@8'BbTwxM$ :13 AM   Rosiland Oz, MD Infectious Disease Physician [facility] [address] [place], [place] [address] Phone: [phone]  Fax: [phone]

## 2020-07-30 DIAGNOSIS — L03116 Cellulitis of left lower limb: Secondary | ICD-10-CM | POA: Diagnosis not present

## 2020-07-30 DIAGNOSIS — C61 Malignant neoplasm of prostate: Secondary | ICD-10-CM | POA: Diagnosis not present

## 2020-07-30 DIAGNOSIS — M009 Pyogenic arthritis, unspecified: Secondary | ICD-10-CM | POA: Diagnosis not present

## 2020-07-30 DIAGNOSIS — I10 Essential (primary) hypertension: Secondary | ICD-10-CM | POA: Diagnosis not present

## 2020-07-30 MED ORDER — JUVEN PO PACK: 1.0000 | PACK | Freq: Two times a day (BID) | ORAL | 0 refills | Status: DC

## 2020-07-30 MED ORDER — DAPTOMYCIN IV (FOR PTA / DISCHARGE USE ONLY): 900.0000 mg | INTRAVENOUS | 0 refills | Status: DC

## 2020-07-30 MED ORDER — ENSURE ENLIVE PO LIQD: 237.0000 mL | ORAL | 0 refills | Status: DC

## 2020-07-30 MED ORDER — CEFTRIAXONE IV (FOR PTA / DISCHARGE USE ONLY): 2.0000 g | INTRAVENOUS | 0 refills | Status: AC

## 2020-07-30 MED ORDER — PROSOURCE PLUS PO LIQD: 30.0000 mL | Freq: Three times a day (TID) | ORAL | 0 refills | Status: DC

## 2020-07-30 MED ORDER — ACETAMINOPHEN 325 MG PO TABS: 650.0000 mg | ORAL_TABLET | Freq: Four times a day (QID) | ORAL | Status: DC | PRN

## 2020-07-30 MED ORDER — OXYCODONE-ACETAMINOPHEN 10-325 MG PO TABS: 1.0000 | ORAL_TABLET | ORAL | 0 refills | Status: DC | PRN

## 2020-07-30 NOTE — Progress Notes (Signed)
OPAT Order Details             Outpatient Parenteral Antibiotic Therapy Consult  Until discontinued       Provider:  (Not yet assigned)  Question Answer Comment  Antibiotic Ceftriaxone (Rocephin) IVPB   Antibiotic Daptomycin (Cubicin) IVPB   Indications for use septic joint   End Date 08/20/2020                PHARMACY CONSULT NOTE FOR:  OUTPATIENT  PARENTERAL ANTIBIOTIC THERAPY (OPAT)  Indication: septic arthritis  Regimen: Dapto/ceftriaxone End date: 08/20/20  IV antibiotic discharge orders are pended. To discharging provider:  please sign these orders via discharge navigator,  Select New Orders & click on the button choice - Manage This Unsigned Work.

## 2020-07-30 NOTE — Progress Notes (Signed)
Discharge instructions, including prescriptions and medication schedule, discussed with patient.  Patient verbalized understanding, denied questions.  IV antibiotics finishing infusing then patient will be discharged home with friend via private vehicle.  NAD.  No changes noted in assessment.

## 2020-07-30 NOTE — Progress Notes (Signed)
Occupational Therapy Treatment Patient Details Name: Andrew Haley MRN: [id number] DOB: Jan 28, 1966 Today's Date: 07/30/2020    History of present illness 55 y.o. male who presented 6/13 with L ankle pain and swelling. Of note, pt was admitted 6/8 - 6/9 at [facility] for cellulitis and was discharged home on antibiotic. CT findings of joint effusion and bony erosions of the distal tibia, talus, dorsal calcaneus concerning for septic arthritis. PMH: arthritis, obesity, HTN, prostate cancer, and sleep apnea.   OT comments  Pt progressing towards established OT goals. Pt donning shirt and socks at EOB with Supervision. Pt performing functional mobility to/from bathroom with Min Guard A and RW. Completing grooming while seated at sink. Continue to recommend dc to home with HHOT and will continue to follow acutely as admitted.    Follow Up Recommendations  Home health OT    Equipment Recommendations  None recommended by OT    Recommendations for Other Services Rehab consult    Precautions / Restrictions Precautions Precautions: Fall;Other (comment) Precaution Comments: LLE swelling, severe pain       Mobility Bed Mobility Overal bed mobility: Independent                  Transfers Overall transfer level: Needs assistance Equipment used: Rolling walker (2 wheeled) Transfers: Sit to/from [facility] Sit to Stand: Supervision;Min guard         General transfer comment: Supervision-Min Guard A foir safety due to poor safety awareness. No physical A needed    Balance Overall balance assessment: Needs assistance Sitting-balance support: No upper extremity supported;Feet supported Sitting balance-[name]: Good Sitting balance - Comments: Static sitting EOB no LOB, supervision for safety.   Standing balance support: Bilateral upper extremity supported;During functional activity Standing balance-[name]: Poor Standing balance comment: heavily  reliant on RW                           ADL either performed or assessed with clinical judgement   ADL Overall ADL's : Needs assistance/impaired     Grooming: Sitting;Wash/dry face;Oral care;Set up           Upper Body Dressing : Set up;Sitting Upper Body Dressing Details (indicate cue type and reason): donned shirt Lower Body Dressing: Supervision/safety;Sit to/from stand;Set up Lower Body Dressing Details (indicate cue type and reason): donning socks Toilet Transfer: Ambulation;BSC;RW;Min guard           Functional mobility during ADLs: Min guard;Rolling walker General ADL Comments: Pt donning socks and shirt at EOB. Pt performing mobility to bathroom for grooming at sink. Min Guard A for safety due to decreased safety awareness     Vision   Vision Assessment?: No apparent visual deficits   Perception     Praxis      Cognition Arousal/Alertness: Awake/alert Behavior During Therapy: WFL for tasks assessed/performed Overall Cognitive Status: Within Functional Limits for tasks assessed                                 General Comments: Pt can be impulsive at times, needing cues to pause and direct pt.        Exercises     Shoulder Instructions       General Comments      Pertinent Vitals/ Pain       Pain Assessment: Faces Faces Pain Scale: Hurts whole lot Pain Location:  LLE ankle/foot Pain Descriptors / Indicators: Discomfort;Sharp;Moaning;Grimacing;Guarding Pain Intervention(s): Monitored during session;Limited activity within patient's tolerance;Repositioned  Home Living                                          Prior Functioning/Environment              Frequency  Min 2X/week        Progress Toward Goals  OT Goals(current goals can now be found in the care plan section)  Progress towards OT goals: Progressing toward goals  Acute Rehab OT Goals Patient Stated Goal: to stretch his L gastroc and  not go to a SNF OT Goal Formulation: With patient Time For Goal Achievement: 08/08/20 Potential to Achieve Goals: Good ADL Goals Pt Will Perform Grooming: with supervision;standing Pt Will Perform Lower Body Dressing: with supervision;sit to/from stand;sitting/lateral leans;with adaptive equipment Pt Will Transfer to Toilet: with supervision;stand pivot transfer;bedside commode Additional ADL Goal #1: Pt will increase to x10 mins of OOB ADL with minguardA with <2 seated rest breaks to increase activity tolerance.  Plan Discharge plan remains appropriate    Co-evaluation                 AM-PAC OT "6 Clicks" Daily Activity     Outcome Measure   Help from another person eating meals?: None Help from another person taking care of personal grooming?: A Little Help from another person toileting, which includes using toliet, bedpan, or urinal?: A Little Help from another person bathing (including washing, rinsing, drying)?: A Lot Help from another person to put on and taking off regular upper body clothing?: A Little Help from another person to put on and taking off regular lower body clothing?: A Lot 6 Click Score: 17    End of Session Equipment Utilized During Treatment: Rolling walker  OT Visit Diagnosis: Unsteadiness on feet (R26.81);Pain   Activity Tolerance Patient tolerated treatment well;Patient limited by pain   Patient Left in chair;with call bell/phone within reach (with PT)   Nurse Communication Mobility status        Time: 1040-1101 OT Time Calculation (min): 21 min  Charges: OT General Charges $OT Visit: 1 Visit OT Treatments $Self Care/Home Management : 8-22 mins  [place], OTR/L Acute Rehab Pager: [phone] Office: [place] 07/30/2020, 11:52 AM

## 2020-07-30 NOTE — Progress Notes (Signed)
Physical Therapy Treatment Patient Details Name: Andrew Haley MRN: [id number] DOB: 1965/08/19 Today's Date: 07/30/2020    History of Present Illness Pt is a 55 y.o. male who presented 6/13 with L ankle pain and swelling. Of note, pt was admitted 6/8 - 6/9 at [facility] for cellulitis and was discharged home on antibiotic. CT findings of joint effusion and bony erosions of the distal tibia, talus, dorsal calcaneus concerning for septic arthritis. PMH: arthritis, obesity, HTN, prostate cancer, and sleep apnea.    PT Comments    Pt progressing towards physical therapy goals. Session focused on gait and stair training and reviewing HEP prior to discharge home. Pt ambulating x 100 feet with a walker at a supervision level. Able to negotiate x 1 step with a walker with some difficulty. Attempted sideways with bilateral hand support on left rail, but pt unable to weight shift to achieve. D/c plan remains appropriate.     Follow Up Recommendations  Supervision for mobility/OOB;Home health PT     Equipment Recommendations  Rolling walker with 5" wheels;3in1 (PT)    Recommendations for Other Services       Precautions / Restrictions Precautions Precautions: Fall;Other (comment) Precaution Comments: LLE swelling, severe pain Restrictions Weight Bearing Restrictions: No    Mobility  Bed Mobility Overal bed mobility: Independent             General bed mobility comments: Sitting up in recliner upon arrival.    Transfers Overall transfer level: Needs assistance Equipment used: Rolling walker (2 wheeled) Transfers: Sit to/from [employer] Transfers Sit to Stand: Supervision         General transfer comment: Cues for placing L foot anteriorly prior to transition to stand or sitting for pain control  Ambulation/Gait Ambulation/Gait assistance: Supervision Gait Distance (Feet): 100 Feet Assistive device: Rolling walker (2 wheeled) Gait Pattern/deviations:  Step-to pattern;Decreased stride length;Decreased stance time - left;Decreased weight shift to left;Decreased dorsiflexion - left;Trunk flexed;Antalgic Gait velocity: reduced   General Gait Details: Pt with heavy use arms on walker, limited weightbearing through LLE and antalgic gait pattern. Supervision for safety, no gross instability noted   Stairs Stairs: Yes Stairs assistance: Min assist Stair Management: With walker;Backwards;Forwards Number of Stairs: 1 General stair comments: Demonstration and verbal cues for technique, minA for balance/safety   Wheelchair Mobility    Modified Rankin (Stroke Patients Only)       Balance Overall balance assessment: Needs assistance Sitting-balance support: No upper extremity supported;Feet supported Sitting balance-[name]: Good Sitting balance - Comments: Static sitting EOB no LOB, supervision for safety.   Standing balance support: Bilateral upper extremity supported;During functional activity Standing balance-[name]: Poor Standing balance comment: heavily reliant on RW                            Cognition Arousal/Alertness: Awake/alert Behavior During Therapy: WFL for tasks assessed/performed Overall Cognitive Status: Within Functional Limits for tasks assessed                                 General Comments: Pt can be impulsive at times, needing cues to pause and direct pt.      Exercises General Exercises - Lower Extremity Ankle Circles/Pumps: Left;10 reps;Seated;AROM    General Comments        Pertinent Vitals/Pain Pain Assessment: Faces Faces Pain Scale: Hurts whole lot Pain Location: LLE ankle/foot Pain  Descriptors / Indicators: Discomfort;Sharp;Moaning;Grimacing;Guarding Pain Intervention(s): Limited activity within patient's tolerance;Monitored during session;Ice applied    Home Living                      Prior Function            PT Goals (current goals can  now be found in the care plan section) Acute Rehab PT Goals Patient Stated Goal: go home PT Goal Formulation: With patient Time For Goal Achievement: 08/08/20 Potential to Achieve Goals: Good Progress towards PT goals: Progressing toward goals    Frequency    Min 3X/week      PT Plan Current plan remains appropriate    Co-evaluation              AM-PAC PT "6 Clicks" Mobility   Outcome Measure  Help needed turning from your back to your side while in a flat bed without using bedrails?: None Help needed moving from lying on your back to sitting on the side of a flat bed without using bedrails?: None Help needed moving to and from a bed to a chair (including a wheelchair)?: A Little Help needed standing up from a chair using your arms (e.g., wheelchair or bedside chair)?: A Little Help needed to walk in hospital room?: A Little Help needed climbing 3-5 steps with a railing? : A Little 6 Click Score: 20    End of Session Equipment Utilized During Treatment: Gait belt Activity Tolerance: Patient tolerated treatment well Patient left: in chair;with call bell/phone within reach;with chair alarm set;with family/visitor present Nurse Communication: Mobility status PT Visit Diagnosis: Unsteadiness on feet (R26.81);Other abnormalities of gait and mobility (R26.89);Muscle weakness (generalized) (M62.81);Difficulty in walking, not elsewhere classified (R26.2);Pain Pain - Right/Left: Left Pain - part of body: Leg     Time: 4656-8127 PT Time Calculation (min) (ACUTE ONLY): 27 min  Charges:  $Gait Training: 8-22 mins $Therapeutic Activity: 8-22 mins                     Wyona Almas, PT, DPT Acute Rehabilitation Services Pager [phone] Office [phone]    Deno Etienne 07/30/2020, 2:01 PM

## 2020-07-30 NOTE — Plan of Care (Signed)

## 2020-07-30 NOTE — Consult Note (Signed)
   [facility] Inpatient Consult   07/30/2020  [name] 11-16-1965 [id number] [place] Organization [ACO] Patient: [name]  Primary Care Provider:  Redmond School, MD office is listed to provide the [facility] follow up.  Patient screened for less than 7 days re-hospitalization and to assess for potential [place] [facility] service needs for post hospital transition.  Review of patient's medical record reveals patient is transitioning home with home health and no additional care management needs identified.   Plan:  Continue to follow progress and disposition to assess for post hospital care management needs.    For questions contact:   Natividad Brood, RN BSN [facility] Liaison  [phone] business mobile phone Toll free office [phone]  Fax number: [phone] [email or web address]

## 2020-07-30 NOTE — Discharge Summary (Signed)
Physician Discharge Summary  Andrew Haley KGM:[id number] DOB: 02-Mar-1965 DOA: 07/23/2020  PCP: Redmond School, MD  Admit date: 07/23/2020 Discharge date: 07/30/2020  Admitted From: Home  Disposition:  Home   Recommendations for Outpatient Follow-up and new medication changes:  Follow up with Dr. Gerarda Fraction in 7 to 10 days. Outpatient antibiotic orders:  Discharge antibiotics to be given via PICC line Discharge antibiotics: Daptomycin and ceftriaxone  Per pharmacy protocol Duration: 4 weeks  End Date: 08/20/20 [facility] Care Per Protocol:   Home health RN for IV administration and teaching; PICC line care and labs.     Labs weekly while on IV antibiotics: CBC with differential CMP CRP ESR Vancomycin trough OR and CK   Please leave PIC in place until doctor has seen patient or been notified   Fax weekly labs to [phone]   Clinic Follow Up Appt: 08/10/20   _0 :45 AM    Home Health: yes   Equipment/Devices: na    Discharge Condition: stable  CODE STATUS: full  Diet recommendation: hearth healthy   Brief/Interim Summary: Andrew Haley was admitted to the hospital with the working diagnosis of left ankle septic arthritis and osteomyelitis, complicated with sepsis (present on admission).    55 year old male past medical history of hypertension, prostate cancer and sleep apnea who presented with left ankle pain and edema.  Recent hospitalization for left ankle cellulitis, discharged on oral antibiotic therapy.  Unfortunately his symptoms worsened prompting him to come back to the hospital.  His blood pressure was 140/86, heart rate 103, respiratory rate 27, oxygen saturation 95%.  His lungs were clear to auscultation bilaterally, heart S1-S2, present, rhythmic, soft abdomen, positive left ankle edema and erythema.   Sodium 132, potassium 2.9, chloride 89, bicarb 33, glucose 105, BUN 15, creatinine 0.8.  White count 12.2, hemoglobin 10.5, hematocrit 33.8, platelets 428.  SARS COVID-19  negative.  Urinalysis specific gravity 1.013, negative nitrates. Chest radiograph no infiltrates.  EKG 95 bpm, left axis deviation, left anterior fascicular block, normal intervals, sinus rhythm, no significant ST segment or T wave changes, positive LVH.   CT left lower extremity with changes suggesting septic arthritis of the left ankle with large complex tibio talar joint effusion containing debris.  New erosions centered at the subtalar and talonavicular joints suspicious for septic arthritis.  Large peroneal tenosynovial fluid collection.  Circumferential subcutaneous edema throughout the lower leg, ankle and dorsal foot. Prior fibular ORIF hardware.  Status post left total knee arthroplasty without evidence of periprosthetic fracture or hardware loosening.   Patient was placed on broad-spectrum antibiotic therapy, orthopedic surgery and ID were consulted.   06/13 left ankle arthrocentesis showed 35,000 wbc with no crystals. Cultures with no growth.    Patient was adamantly against any possibility of amputation.  Decision was made to continue IV antibiotic therapy and close follow-up as an outpatient.   A PICC line was placed 06/18  Left ankle septic arthritis and osteomyelitis, complicated with sepsis, present on admission.  Patient was admitted to the medical ward, he responded well to broad-spectrum antibiotic therapy.  Surgery and infectious disease were consulted.  His cultures remain no growth. Patient declined any amputation and decision was made to continue IV antibiotic therapy as an outpatient with close follow-up. Lower extremity ultrasonography has been negative for DVT (07/18/20).  Follow-up plan, as long as patient clinically and laboratory improves, consider the possibility of performing  biopsy and culture while off antibiotics to ensure eradication of any infection.  If no further  infection, consider TTC arthrodesis at that point.  2.  Hypokalemia.  Patient received  potassium chloride with correction of electrolytes, discharge his kidney function is stable. Sodium 138, potassium 3.8, chloride 98, bicarb 31, glucose 109, BUN 7, creatinine 0.69.  3.  Obesity class I, obstructive sleep apnea.  Calculated BMI 31, continue home CPAP.  4.  Anemia of chronic disease, B12 deficiency, hypoalbuminemia.  His cell count remained stable, no indication for PRBC transfusion.  No frank malnutrition, but nutritional supplements added due to high risk.   5.  History of prostate cancer.  He is s/p surgery, follow-up as an outpatient.  6.  Hypertension.  Initially his antihypertensive agents were held, his sepsis resolved and hydrochlorothiazide and losartan were resumed.    Discharge Diagnoses:  Principal Problem:   Septic arthritis [facility]) Active Problems:   Prostate cancer [facility])   Essential hypertension   Cellulitis    Discharge Instructions   Allergies as of 07/30/2020   No Known Allergies      Medication List     STOP taking these medications    doxycycline 50 MG capsule Commonly known as: VIBRAMYCIN   ibuprofen 800 MG tablet Commonly known as: ADVIL       TAKE these medications    (feeding supplement) PROSource Plus liquid Take 30 mLs by mouth 3 (three) times daily between meals.   nutrition supplement (JUVEN) Pack Take 1 packet by mouth 2 (two) times daily between meals.   feeding supplement Liqd Take 237 mLs by mouth daily.   acetaminophen 325 MG tablet Commonly known as: TYLENOL Take 2 tablets (650 mg total) by mouth every 6 (six) hours as needed for mild pain (or Fever >/= 101).   allopurinol 300 MG tablet Commonly known as: Zyloprim Take 1 tablet (300 mg total) by mouth daily.   cefTRIAXone  IVPB Commonly known as: ROCEPHIN Inject 2 g into the vein daily for 21 days. Indication:  septic arthritis  First Dose: No Last Day of Therapy:  28 Labs - Once weekly:  CBC/D and BMP Labs - Every other week:  ESR and CRP Method of  administration: IV Push Method of administration may be changed at the discretion of home infusion pharmacist based upon assessment of the patient and/or caregiver's ability to self-administer the medication ordered.   daptomycin  IVPB Commonly known as: CUBICIN Inject 900 mg into the vein daily. Indication:  septic arthritis  First Dose: No Last Day of Therapy:  28 Labs - Once weekly:  CBC/D, BMP, and CPK Labs - Every other week:  ESR and CRP Method of administration: IV Push Method of administration may be changed at the discretion of home infusion pharmacist based upon assessment of the patient and/or caregiver's ability to self-administer the medication ordered.   GOODY PM PO Take 1 packet by mouth at bedtime as needed (sleep).   losartan-hydrochlorothiazide 50-12.5 MG tablet Commonly known as: Hyzaar Take 1 tablet by mouth daily.   methocarbamol 750 MG tablet Commonly known as: Robaxin-750 Take 1 tablet (750 mg total) by mouth every 8 (eight) hours as needed for muscle spasms.   oxyCODONE-acetaminophen 10-325 MG tablet Commonly known as: PERCOCET Take 1 tablet by mouth every 4 (four) hours as needed for pain.               Discharge Care Instructions  (From admission, onward)           Start     Ordered   07/30/20 0000  Change dressing  on IV access line weekly and PRN  (Home infusion instructions - [facility] )        07/30/20 1043   07/30/20 0000  Change dressing on IV access line weekly and PRN  (Home infusion instructions - [facility] )        07/30/20 1105            Follow-up Information     Care, [facility] Follow up.   Specialty: Home Health Services Why: Someone from the home health agency will be in contact with you after discharge to arrange your nursing appointment. Contact information: [place] [address] [phone]                No Known  Allergies  Consultations: ID Orthopedics    Procedures/Studies: DG Chest 1 View  Result Date: 07/23/2020 CLINICAL DATA:  Swelling and redness of the foot EXAM: CHEST  1 VIEW COMPARISON:  11/28/2015 FINDINGS: The heart size and mediastinal contours are within normal limits. Both lungs are clear. The visualized skeletal structures are unremarkable. IMPRESSION: No active disease. Electronically Signed   By: Donavan Foil M.D.   On: 07/23/2020 19:52   US Venous Img Lower Unilateral Left  Result Date: 07/18/2020 CLINICAL DATA:  Swelling. EXAM: LEFT LOWER EXTREMITY VENOUS DOPPLER ULTRASOUND TECHNIQUE: Gray-scale sonography with compression, as well as color and duplex ultrasound, were performed to evaluate the deep venous system(s) from the level of the common femoral vein through the popliteal and proximal calf veins. COMPARISON:  None. FINDINGS: VENOUS Normal compressibility of the common femoral, superficial femoral, and popliteal veins, as well as the visualized calf veins. Visualized portions of profunda femoral vein and great saphenous vein unremarkable. No filling defects to suggest DVT on grayscale or color Doppler imaging. Doppler waveforms show normal direction of venous flow, normal respiratory plasticity and response to augmentation. Limited views of the contralateral common femoral vein are unremarkable. IMPRESSION: No evidence of DVT. Electronically Signed   By: Margaretha Sheffield MD   On: 07/18/2020 16:37   DG Foot Complete Left  Result Date: 07/18/2020 CLINICAL DATA:  Left foot pain. EXAM: LEFT FOOT - COMPLETE 3+ VIEW COMPARISON:  None. FINDINGS: There is no evidence of an acute fracture or dislocation. A radiopaque fixation plate and screws are seen along the left lateral malleolus. Chronic deformities are also seen involving the distal left tibia. The second through fifth left toes are flexed in position and subsequently limited in evaluation. A small chronic versus congenital deformity  is seen along the lateral aspect of the fifth left metatarsal. Mild degenerative changes are seen involving the mid left foot. Marked severity diffuse soft tissue swelling is seen throughout the left foot and left ankle. IMPRESSION: 1. Chronic, degenerative and postoperative changes without an acute osseous abnormality. 2. Diffuse soft tissue swelling. Electronically Signed   By: Virgina Norfolk M.D.   On: 07/18/2020 17:53   CT EXTREMITY LOWER LEFT WO CONTRAST  Result Date: 07/23/2020 CLINICAL DATA:  Left leg redness and swelling, leukocytosis EXAM: CT OF THE LOWER LEFT EXTREMITY WITHOUT CONTRAST TECHNIQUE: Multidetector CT imaging of the lower left extremity was performed according to the standard protocol. Field of view is from the left hip through the left foot. COMPARISON:  X-ray 07/18/2020, CT 02/29/2020 FINDINGS: Bones/Joint/Cartilage No acute fracture the left femur. Hip joint is intact with mild arthropathy. No appreciable hip joint effusion. Status post left total knee arthroplasty. Extensive metallic streak artifact related  to patient's hardware degrades evaluation of the adjacent structures. Within this limitation. There is no evidence of periprosthetic fracture or hardware loosening. Small knee joint effusion with synovial thickening. Severe degenerative changes of the tibiotalar joint with widening of the joint space and progressive erosions along both sides of the joint as well as throughout the medial and lateral malleoli. Additionally, there are erosive changes associated with the subtalar joint and along the posterior aspect of the calcaneus. Suspected erosions along the dorsal aspect of the navicular bone. No tibiotalar joint dislocation. Prior fibular ORIF hardware. Degenerative changes of the forefoot, most pronounced at the first MTP joint where there is hallux valgus deformity. Large complex tibiotalar joint effusion containing debris. Ligaments Suboptimally assessed by CT. Muscles and  Tendons Suspect large peroneal tenosynovial fluid collection (series 11, image 113). Fatty infiltration of the calf musculature. Soft tissues Circumferential subcutaneous edema throughout the lower leg. Marked diffuse soft tissue swelling and edema of the ankle and dorsal foot. No soft tissue gas. IMPRESSION: 1. Findings suggestive of septic arthritis of the left ankle with large complex tibiotalar joint effusion containing debris. New erosions centered at the subtalar and talonavicular joints also suspicious for septic arthritis. Orthopedic surgery consultation recommended. 2. Suspect large peroneal tenosynovial fluid collection which could be septic or reactive. 3. Circumferential subcutaneous edema throughout the lower leg, ankle, and dorsal foot, which may reflect cellulitis. No soft tissue gas. 4. Status post left total knee arthroplasty without evidence of periprosthetic fracture or hardware loosening. Electronically Signed   By: Davina Poke D.O.   On: 07/23/2020 19:57   [place] EKG SITE RITE  Result Date: 07/28/2020 If Site Rite image not attached, placement could not be confirmed due to current cardiac rhythm.    Procedures: Left ankle arthrocentesis  Subjective: Patient is feeling better, his pain is well controlled with analgesics, no nausea or vomiting, no chest pain or dyspnea.   Discharge Exam: Vitals:   07/29/20 2312 07/30/20 0802  BP: 128/80 (!) 161/96  Pulse: 80   Resp: 16   Temp: 98.4 F (36.9 C) 97.9 F (36.6 C)  SpO2: 100%    Vitals:   07/29/20 1537 07/29/20 2006 07/29/20 2312 07/30/20 0802  BP: (!) 151/86 (!) 136/92 128/80 (!) 161/96  Pulse: 96 84 80   Resp: _0 Temp: 97.9 F (36.6 C) 98.4 F (36.9 C) 98.4 F (36.9 C) 97.9 F (36.6 C)  TempSrc:  Oral Oral Oral  SpO2: 99% 100% 100%   Weight:      Height:        General: Not in pain or dyspnea,  Neurology: Awake and alert, non focal  E ENT: no pallor, no icterus, oral mucosa moist Cardiovascular: No  JVD. S1-S2 present, rhythmic, no gallops, rubs, or murmurs. No lower extremity edema. Pulmonary: positive breath sounds bilaterally, adequate air movement, no wheezing, rhonchi or rales. Gastrointestinal. Abdomen soft and non tender Skin. No rashes Musculoskeletal: left ankle with edema and mild tenderness to palpation.   The results of significant diagnostics from this hospitalization (including imaging, microbiology, ancillary and laboratory) are listed below for reference.     Microbiology: Recent Results (from the past 240 hour(s))  Urine culture     Status: Abnormal   Collection Time: 07/23/20  5:49 PM   Specimen: In/Out Cath Urine  Result Value Ref Range Status   Specimen Description IN/OUT CATH URINE  Final   Special Requests   Final    NONE Performed at [facility], [place] [address], [place], [place] [address]    Culture MULTIPLE SPECIES PRESENT, SUGGEST RECOLLECTION (A)  Final   Report Status 07/26/2020 FINAL  Final  Culture, blood (Routine x 2)     Status: None   Collection Time: 07/23/20  6:19 PM   Specimen: BLOOD  Result Value Ref Range Status   Specimen Description BLOOD RIGHT ANTECUBITAL  Final   Special Requests   Final    BOTTLES DRAWN AEROBIC ONLY Blood Culture adequate volume   Culture   Final    NO GROWTH 5 DAYS Performed at [facility], [place] [address] [place], [place] [address]    Report Status 07/28/2020 FINAL  Final  Culture, blood (Routine x 2)     Status: None   Collection Time: 07/23/20  6:19 PM   Specimen: BLOOD  Result Value Ref Range Status   Specimen Description BLOOD LEFT ANTECUBITAL  Final   Special Requests   Final    BOTTLES DRAWN AEROBIC ONLY Blood Culture results may not be optimal due to an inadequate volume of blood received in culture bottles   Culture   Final    NO GROWTH 5 DAYS Performed at [facility], [place] [address], [place], [place] [address]    Report Status 07/28/2020 FINAL  Final  Resp Panel by RT-PCR (Flu  A&B, Covid) Nasopharyngeal Swab     Status: None   Collection Time: 07/23/20 10:44 PM   Specimen: Nasopharyngeal Swab; Nasopharyngeal(NP) swabs in vial transport medium  Result Value Ref Range Status   SARS Coronavirus 2 by RT PCR NEGATIVE NEGATIVE Final    Comment: (NOTE) SARS-CoV-2 target nucleic acids are NOT DETECTED.  The SARS-CoV-2 RNA is generally detectable in upper respiratory specimens during the acute phase of infection. The lowest concentration of SARS-CoV-2 viral copies this assay can detect is 138 copies/mL. A negative result does not preclude SARS-Cov-2 infection and should not be used as the sole basis for treatment or other patient management decisions. A negative result may occur with  improper specimen collection/handling, submission of specimen other than nasopharyngeal swab, presence of viral mutation(s) within the areas targeted by this assay, and inadequate number of viral copies(<138 copies/mL). A negative result must be combined with clinical observations, patient history, and epidemiological information. The expected result is Negative.  Fact Sheet for Patients:  EntrepreneurPulse.com.au  Fact Sheet for Healthcare Providers:  IncredibleEmployment.be  This test is no t yet approved or cleared by the [place] FDA and  has been authorized for detection and/or diagnosis of SARS-CoV-2 by FDA under an Emergency Use Authorization (EUA). This EUA will remain  in effect (meaning this test can be used) for the duration of the COVID-19 declaration under Section 564(b)(1) of the Act, 21 U.S.C.section 360bbb-3(b)(1), unless the authorization is terminated  or revoked sooner.       Influenza A by PCR NEGATIVE NEGATIVE Final   Influenza B by PCR NEGATIVE NEGATIVE Final    Comment: (NOTE) The Xpert Xpress SARS-CoV-2/FLU/RSV plus assay is intended as an aid in the diagnosis of influenza from Nasopharyngeal swab specimens  and should not be used as a sole basis for treatment. Nasal washings and aspirates are unacceptable for Xpert Xpress SARS-CoV-2/FLU/RSV testing.  Fact Sheet for Patients: EntrepreneurPulse.com.au  Fact Sheet for Healthcare Providers: IncredibleEmployment.be  This test is not yet approved or cleared by the [place] FDA and has been authorized for detection and/or diagnosis of SARS-CoV-2 by FDA under an Emergency Use  Authorization (EUA). This EUA will remain in effect (meaning this test can be used) for the duration of the COVID-19 declaration under Section 564(b)(1) of the Act, 21 U.S.C. section 360bbb-3(b)(1), unless the authorization is terminated or revoked.  Performed at [facility], [place] [address], [place], [place] [address]   Body fluid culture w Gram Stain     Status: None   Collection Time: 07/24/20 12:27 AM   Specimen: Body Fluid  Result Value Ref Range Status   Specimen Description FLUID ANKLE  Final   Special Requests NONE  Final   Gram Stain   Final    WBC PRESENT, PREDOMINANTLY MONONUCLEAR NO ORGANISMS SEEN CYTOSPIN SMEAR    Culture   Final    NO GROWTH 3 DAYS Performed at [facility], [address], [place],  [address]    Report Status 07/27/2020 FINAL  Final     Labs: BNP (last 3 results) No results for input(s): BNP in the last 8760 hours. Basic Metabolic Panel: Recent Labs  Lab 07/24/20 0512 07/25/20 0231 07/26/20 0330 07/27/20 0013 07/28/20 0327  NA 134* 138 138 141 139  K 3.1* 3.3* 3.5 3.6 3.8  CL 93* 96* 96* 100 98  CO2 30 34* 35* 34* 31  GLUCOSE 127* 100* 117* 103* 109*  BUN _0 CREATININE 0.76 0.64 0.60* 0.66 0.69  CALCIUM 8.7* 8.3* 8.5* 8.8* 9.0  MG  --   --  1.8  --   --    Liver Function Tests: Recent Labs  Lab 07/23/20 1847 07/25/20 0231 07/28/20 0327  AST 16 12* 18  ALT _1 ALKPHOS 81 48 53  BILITOT 0.5 0.3 0.5  PROT 8.4* 5.7* 6.7  ALBUMIN 2.7*  1.9* 2.2*   No results for input(s): LIPASE, AMYLASE in the last 168 hours. No results for input(s): AMMONIA in the last 168 hours. CBC: Recent Labs  Lab 07/23/20 1847 07/24/20 0512 07/25/20 0231 07/26/20 0330 07/27/20 0013 07/28/20 0327  WBC 12.2* 10.9* 7.2 8.6 9.7 9.6  NEUTROABS 10.3*  --  5.3  --   --  6.6  HGB 10.5* 9.4* 9.1* 8.9* 9.4* 10.3*  HCT 33.8* 30.4* 29.4* 29.6* 30.8* 32.4*  MCV 82.4 83.3 83.5 83.9 83.5 81.2  PLT 428* 336 304 358 442* 449*   Cardiac Enzymes: Recent Labs  Lab 07/28/20 1044  CKTOTAL 27*   BNP: Invalid input(s): POCBNP CBG: Recent Labs  Lab 07/25/20 1722 07/25/20 2313 07/26/20 1200 07/26/20 2325 07/27/20 0716  GLUCAP 104* 106* 137* 95 103*   D-Dimer No results for input(s): DDIMER in the last 72 hours. Hgb A1c No results for input(s): HGBA1C in the last 72 hours. Lipid Profile No results for input(s): CHOL, HDL, LDLCALC, TRIG, CHOLHDL, LDLDIRECT in the last 72 hours. Thyroid function studies No results for input(s): TSH, T4TOTAL, T3FREE, THYROIDAB in the last 72 hours.  Invalid input(s): FREET3 Anemia work up No results for input(s): VITAMINB12, FOLATE, FERRITIN, TIBC, IRON, RETICCTPCT in the last 72 hours. Urinalysis    Component Value Date/Time   COLORURINE YELLOW 07/23/2020 [place] 07/23/2020 1749   LABSPEC 1.013 07/23/2020 1749   PHURINE 5.0 07/23/2020 1749   GLUCOSEU NEGATIVE 07/23/2020 1749   HGBUR NEGATIVE 07/23/2020 1749   BILIRUBINUR NEGATIVE 07/23/2020 1749   KETONESUR NEGATIVE 07/23/2020 1749   PROTEINUR NEGATIVE 07/23/2020 1749   UROBILINOGEN 1.0 05/25/2014 0822   NITRITE NEGATIVE 07/23/2020 1749   LEUKOCYTESUR NEGATIVE 07/23/2020  1749   Sepsis Labs Invalid input(s): PROCALCITONIN,  WBC,  LACTICIDVEN Microbiology Recent Results (from the past 240 hour(s))  Urine culture     Status: Abnormal   Collection Time: 07/23/20  5:49 PM   Specimen: In/Out Cath Urine  Result Value Ref Range Status    Specimen Description IN/OUT CATH URINE  Final   Special Requests   Final    NONE Performed at [facility], [address] [place], [place] [address]    Culture MULTIPLE SPECIES PRESENT, SUGGEST RECOLLECTION (A)  Final   Report Status 07/26/2020 FINAL  Final  Culture, blood (Routine x 2)     Status: None   Collection Time: 07/23/20  6:19 PM   Specimen: BLOOD  Result Value Ref Range Status   Specimen Description BLOOD RIGHT ANTECUBITAL  Final   Special Requests   Final    BOTTLES DRAWN AEROBIC ONLY Blood Culture adequate volume   Culture   Final    NO GROWTH 5 DAYS Performed at [facility], [place] [address] [place], [place] [address]    Report Status 07/28/2020 FINAL  Final  Culture, blood (Routine x 2)     Status: None   Collection Time: 07/23/20  6:19 PM   Specimen: BLOOD  Result Value Ref Range Status   Specimen Description BLOOD LEFT ANTECUBITAL  Final   Special Requests   Final    BOTTLES DRAWN AEROBIC ONLY Blood Culture results may not be optimal due to an inadequate volume of blood received in culture bottles   Culture   Final    NO GROWTH 5 DAYS Performed at [facility], [place] [address] [place], [place] [address]    Report Status 07/28/2020 FINAL  Final  Resp Panel by RT-PCR (Flu A&B, Covid) Nasopharyngeal Swab     Status: None   Collection Time: 07/23/20 10:44 PM   Specimen: Nasopharyngeal Swab; Nasopharyngeal(NP) swabs in vial transport medium  Result Value Ref Range Status   SARS Coronavirus 2 by RT PCR NEGATIVE NEGATIVE Final    Comment: (NOTE) SARS-CoV-2 target nucleic acids are NOT DETECTED.  The SARS-CoV-2 RNA is generally detectable in upper respiratory specimens during the acute phase of infection. The lowest concentration of SARS-CoV-2 viral copies this assay can detect is 138 copies/mL. A negative result does not preclude SARS-Cov-2 infection and should not be used as the sole basis for treatment or other patient management decisions.  A negative result may occur with  improper specimen collection/handling, submission of specimen other than nasopharyngeal swab, presence of viral mutation(s) within the areas targeted by this assay, and inadequate number of viral copies(<138 copies/mL). A negative result must be combined with clinical observations, patient history, and epidemiological information. The expected result is Negative.  Fact Sheet for Patients:  EntrepreneurPulse.com.au  Fact Sheet for Healthcare Providers:  IncredibleEmployment.be  This test is no t yet approved or cleared by the [place] FDA and  has been authorized for detection and/or diagnosis of SARS-CoV-2 by FDA under an Emergency Use Authorization (EUA). This EUA will remain  in effect (meaning this test can be used) for the duration of the COVID-19 declaration under Section 564(b)(1) of the Act, 21 U.S.C.section 360bbb-3(b)(1), unless the authorization is terminated  or revoked sooner.       Influenza A by PCR NEGATIVE NEGATIVE Final   Influenza B by PCR NEGATIVE NEGATIVE Final    Comment: (NOTE) The Xpert Xpress SARS-CoV-2/FLU/RSV plus assay is intended as an aid in  the diagnosis of influenza from Nasopharyngeal swab specimens and should not be used as a sole basis for treatment. Nasal washings and aspirates are unacceptable for Xpert Xpress SARS-CoV-2/FLU/RSV testing.  Fact Sheet for Patients: EntrepreneurPulse.com.au  Fact Sheet for Healthcare Providers: IncredibleEmployment.be  This test is not yet approved or cleared by the [place] FDA and has been authorized for detection and/or diagnosis of SARS-CoV-2 by FDA under an Emergency Use Authorization (EUA). This EUA will remain in effect (meaning this test can be used) for the duration of the COVID-19 declaration under Section 564(b)(1) of the Act, 21 U.S.C. section 360bbb-3(b)(1), unless the authorization  is terminated or revoked.  Performed at [facility], [place] [address], [place], [place] [address]   Body fluid culture w Gram Stain     Status: None   Collection Time: 07/24/20 12:27 AM   Specimen: Body Fluid  Result Value Ref Range Status   Specimen Description FLUID ANKLE  Final   Special Requests NONE  Final   Gram Stain   Final    WBC PRESENT, PREDOMINANTLY MONONUCLEAR NO ORGANISMS SEEN CYTOSPIN SMEAR    Culture   Final    NO GROWTH 3 DAYS Performed at [facility], [address] [place], [place] [address]    Report Status 07/27/2020 FINAL  Final     Time coordinating discharge: 45 minutes  SIGNED:   Tawni Millers, MD  [facility] 07/30/2020, 9:53 AM

## 2020-07-30 NOTE — TOC Transition Note (Signed)
Transition of Care (TOC) - CM/SW Discharge Note Marvetta Gibbons RN,BSN Transitions of Care Unit 4NP (non trauma) - RN Case Manager See Treatment Team for direct Phone #    Patient Details  Name: Andrew Haley MRN: [id number] Date of Birth: 1965/05/21  Transition of Care [facility]) CM/SW Contact:  Dawayne Patricia, RN Phone Number: 07/30/2020, 12:17 PM   Clinical Narrative:    Pt stable for transition home today, plan to stay with brother so that he  can assist him with his home IV abx needs.  Address is [place] [address]  Have spoken with Jeannene Patella at [facility]- she will come to bedside this am to do bedside education with pt for his home IV abx needs. Pt will need to receive both his abx here prior to discharge- have discussed this with his bedside RN and she has arranged timing for doses with Pharmacy.   HH orders have been placed for HHRN/PT/OT- referral has been accepted by [facility] for [facility] needs. Spoke with Tommi Rumps at [facility] and confirmed.      Final next level of care: [place] Barriers to Discharge: No Barriers Identified   Patient Goals and CMS Choice Patient states their goals for this hospitalization and ongoing recovery are:: to go home CMS Medicare.gov Compare Post Acute Care list provided to:: Patient Choice offered to / list presented to : Patient  Discharge Placement               Home with [facility] (IV abx)        Discharge Plan and Services   Discharge Planning Services: CM Consult Post Acute Care Choice: Home Health          DME Arranged: N/A DME Agency: NA       HH Arranged: RN, IV Antibiotics HH Agency: [facility], [facility] Date [place]: 07/28/20 Time [place]: 69 Representative spoke with at [place]: [place] ([place]) Interventions     Readmission Risk Interventions Readmission Risk Prevention Plan 07/30/2020  Post Dischage Appt  Complete  Medication Screening Complete  Transportation Screening Complete  Some recent data might be hidden

## 2020-07-31 DIAGNOSIS — M009 Pyogenic arthritis, unspecified: Secondary | ICD-10-CM | POA: Diagnosis not present

## 2020-07-31 DIAGNOSIS — Z681 Body mass index (BMI) 19 or less, adult: Secondary | ICD-10-CM | POA: Diagnosis not present

## 2020-07-31 DIAGNOSIS — G894 Chronic pain syndrome: Secondary | ICD-10-CM | POA: Diagnosis not present

## 2020-07-31 DIAGNOSIS — M868X7 Other osteomyelitis, ankle and foot: Secondary | ICD-10-CM | POA: Diagnosis not present

## 2020-08-01 DIAGNOSIS — G4733 Obstructive sleep apnea (adult) (pediatric): Secondary | ICD-10-CM | POA: Diagnosis not present

## 2020-08-01 DIAGNOSIS — I119 Hypertensive heart disease without heart failure: Secondary | ICD-10-CM | POA: Diagnosis not present

## 2020-08-01 DIAGNOSIS — M869 Osteomyelitis, unspecified: Secondary | ICD-10-CM | POA: Diagnosis not present

## 2020-08-01 DIAGNOSIS — A419 Sepsis, unspecified organism: Secondary | ICD-10-CM | POA: Diagnosis not present

## 2020-08-01 DIAGNOSIS — M19072 Primary osteoarthritis, left ankle and foot: Secondary | ICD-10-CM | POA: Diagnosis not present

## 2020-08-01 DIAGNOSIS — M009 Pyogenic arthritis, unspecified: Secondary | ICD-10-CM | POA: Diagnosis not present

## 2020-08-01 DIAGNOSIS — I444 Left anterior fascicular block: Secondary | ICD-10-CM | POA: Diagnosis not present

## 2020-08-01 DIAGNOSIS — D510 Vitamin B12 deficiency anemia due to intrinsic factor deficiency: Secondary | ICD-10-CM | POA: Diagnosis not present

## 2020-08-01 DIAGNOSIS — D72829 Elevated white blood cell count, unspecified: Secondary | ICD-10-CM | POA: Diagnosis not present

## 2020-08-02 DIAGNOSIS — G4733 Obstructive sleep apnea (adult) (pediatric): Secondary | ICD-10-CM | POA: Diagnosis not present

## 2020-08-02 DIAGNOSIS — D72829 Elevated white blood cell count, unspecified: Secondary | ICD-10-CM | POA: Diagnosis not present

## 2020-08-02 DIAGNOSIS — M869 Osteomyelitis, unspecified: Secondary | ICD-10-CM | POA: Diagnosis not present

## 2020-08-02 DIAGNOSIS — A419 Sepsis, unspecified organism: Secondary | ICD-10-CM | POA: Diagnosis not present

## 2020-08-02 DIAGNOSIS — I119 Hypertensive heart disease without heart failure: Secondary | ICD-10-CM | POA: Diagnosis not present

## 2020-08-02 DIAGNOSIS — M009 Pyogenic arthritis, unspecified: Secondary | ICD-10-CM | POA: Diagnosis not present

## 2020-08-02 DIAGNOSIS — D510 Vitamin B12 deficiency anemia due to intrinsic factor deficiency: Secondary | ICD-10-CM | POA: Diagnosis not present

## 2020-08-02 DIAGNOSIS — M19072 Primary osteoarthritis, left ankle and foot: Secondary | ICD-10-CM | POA: Diagnosis not present

## 2020-08-02 DIAGNOSIS — I444 Left anterior fascicular block: Secondary | ICD-10-CM | POA: Diagnosis not present

## 2020-08-03 DIAGNOSIS — I444 Left anterior fascicular block: Secondary | ICD-10-CM | POA: Diagnosis not present

## 2020-08-03 DIAGNOSIS — G4733 Obstructive sleep apnea (adult) (pediatric): Secondary | ICD-10-CM | POA: Diagnosis not present

## 2020-08-03 DIAGNOSIS — D72829 Elevated white blood cell count, unspecified: Secondary | ICD-10-CM | POA: Diagnosis not present

## 2020-08-03 DIAGNOSIS — A419 Sepsis, unspecified organism: Secondary | ICD-10-CM | POA: Diagnosis not present

## 2020-08-03 DIAGNOSIS — I119 Hypertensive heart disease without heart failure: Secondary | ICD-10-CM | POA: Diagnosis not present

## 2020-08-03 DIAGNOSIS — M19072 Primary osteoarthritis, left ankle and foot: Secondary | ICD-10-CM | POA: Diagnosis not present

## 2020-08-03 DIAGNOSIS — D510 Vitamin B12 deficiency anemia due to intrinsic factor deficiency: Secondary | ICD-10-CM | POA: Diagnosis not present

## 2020-08-03 DIAGNOSIS — M869 Osteomyelitis, unspecified: Secondary | ICD-10-CM | POA: Diagnosis not present

## 2020-08-03 DIAGNOSIS — M009 Pyogenic arthritis, unspecified: Secondary | ICD-10-CM | POA: Diagnosis not present

## 2020-08-06 DIAGNOSIS — M009 Pyogenic arthritis, unspecified: Secondary | ICD-10-CM | POA: Diagnosis not present

## 2020-08-06 DIAGNOSIS — A419 Sepsis, unspecified organism: Secondary | ICD-10-CM | POA: Diagnosis not present

## 2020-08-06 DIAGNOSIS — D510 Vitamin B12 deficiency anemia due to intrinsic factor deficiency: Secondary | ICD-10-CM | POA: Diagnosis not present

## 2020-08-06 DIAGNOSIS — M869 Osteomyelitis, unspecified: Secondary | ICD-10-CM | POA: Diagnosis not present

## 2020-08-06 DIAGNOSIS — M00272 Other streptococcal arthritis, left ankle and foot: Secondary | ICD-10-CM | POA: Diagnosis not present

## 2020-08-06 DIAGNOSIS — I119 Hypertensive heart disease without heart failure: Secondary | ICD-10-CM | POA: Diagnosis not present

## 2020-08-06 DIAGNOSIS — I444 Left anterior fascicular block: Secondary | ICD-10-CM | POA: Diagnosis not present

## 2020-08-06 DIAGNOSIS — M19072 Primary osteoarthritis, left ankle and foot: Secondary | ICD-10-CM | POA: Diagnosis not present

## 2020-08-06 DIAGNOSIS — G4733 Obstructive sleep apnea (adult) (pediatric): Secondary | ICD-10-CM | POA: Diagnosis not present

## 2020-08-06 DIAGNOSIS — D72829 Elevated white blood cell count, unspecified: Secondary | ICD-10-CM | POA: Diagnosis not present

## 2020-08-09 DIAGNOSIS — L03116 Cellulitis of left lower limb: Secondary | ICD-10-CM | POA: Diagnosis not present

## 2020-08-09 DIAGNOSIS — M009 Pyogenic arthritis, unspecified: Secondary | ICD-10-CM | POA: Diagnosis not present

## 2020-08-10 ENCOUNTER — Inpatient Hospital Stay: Payer: Medicare HMO | Admitting: Infectious Diseases

## 2020-08-10 DIAGNOSIS — M869 Osteomyelitis, unspecified: Secondary | ICD-10-CM | POA: Diagnosis not present

## 2020-08-10 DIAGNOSIS — I119 Hypertensive heart disease without heart failure: Secondary | ICD-10-CM | POA: Diagnosis not present

## 2020-08-10 DIAGNOSIS — G4733 Obstructive sleep apnea (adult) (pediatric): Secondary | ICD-10-CM | POA: Diagnosis not present

## 2020-08-10 DIAGNOSIS — M009 Pyogenic arthritis, unspecified: Secondary | ICD-10-CM | POA: Diagnosis not present

## 2020-08-10 DIAGNOSIS — A419 Sepsis, unspecified organism: Secondary | ICD-10-CM | POA: Diagnosis not present

## 2020-08-10 DIAGNOSIS — D72829 Elevated white blood cell count, unspecified: Secondary | ICD-10-CM | POA: Diagnosis not present

## 2020-08-10 DIAGNOSIS — I444 Left anterior fascicular block: Secondary | ICD-10-CM | POA: Diagnosis not present

## 2020-08-10 DIAGNOSIS — M19072 Primary osteoarthritis, left ankle and foot: Secondary | ICD-10-CM | POA: Diagnosis not present

## 2020-08-10 DIAGNOSIS — D510 Vitamin B12 deficiency anemia due to intrinsic factor deficiency: Secondary | ICD-10-CM | POA: Diagnosis not present

## 2020-08-14 DIAGNOSIS — I444 Left anterior fascicular block: Secondary | ICD-10-CM | POA: Diagnosis not present

## 2020-08-14 DIAGNOSIS — M869 Osteomyelitis, unspecified: Secondary | ICD-10-CM | POA: Diagnosis not present

## 2020-08-14 DIAGNOSIS — A419 Sepsis, unspecified organism: Secondary | ICD-10-CM | POA: Diagnosis not present

## 2020-08-14 DIAGNOSIS — M009 Pyogenic arthritis, unspecified: Secondary | ICD-10-CM | POA: Diagnosis not present

## 2020-08-14 DIAGNOSIS — I119 Hypertensive heart disease without heart failure: Secondary | ICD-10-CM | POA: Diagnosis not present

## 2020-08-14 DIAGNOSIS — D72829 Elevated white blood cell count, unspecified: Secondary | ICD-10-CM | POA: Diagnosis not present

## 2020-08-14 DIAGNOSIS — G4733 Obstructive sleep apnea (adult) (pediatric): Secondary | ICD-10-CM | POA: Diagnosis not present

## 2020-08-14 DIAGNOSIS — M19072 Primary osteoarthritis, left ankle and foot: Secondary | ICD-10-CM | POA: Diagnosis not present

## 2020-08-14 DIAGNOSIS — D510 Vitamin B12 deficiency anemia due to intrinsic factor deficiency: Secondary | ICD-10-CM | POA: Diagnosis not present

## 2020-08-14 NOTE — Progress Notes (Addendum)
Office Visit Note  Patient: Andrew Haley             Date of Birth: [birth year]           MRN: [id number]             PCP: Redmond School, MD Referring: Redmond School, MD Visit Date: 08/28/2020 Occupation: @GUAROCC @  Subjective:  Pain in multiple joints   History of Present Illness: Andrew Haley is a 55 y.o. male in consultation per request of Dr. Gerarda Fraction.  According the patient he developed arthritis in his knee joints several years ago.  He underwent right total knee replacement in 2010 and left total knee replacement in 2016.  He also had arthritis in his hands for several years.  He has decreased grip strength and stiffness in his hands.  He states about 3 years ago he fell off the trailer and fractured his left femur and left ankle.  His left femur did not require surgery but he had surgery on his left ankle by Dr. Lucia Gaskins.  At [facility].  He states that the surgery was about 2 years ago but he never noticed any improvement in his ankle.  He had been using crutches all this time.  He states that Dr.Adair discussed left ankle joint fusion but due to COVID-19 it could not be scheduled.  Patient developed COVID-19 infection about 9 months ago and could not have surgery and then Dr. Lucia Gaskins developed COVID-19 infection.  He states about 6 weeks ago he developed redness and severe swelling in his left lower extremity.  He was seen at the [facility] and was discharged home.  His swelling got worse and he was seen at [facility] where he was diagnosed with cellulitis and was started on IV antibiotics.  He states he has a PICC line and he is still on the antibiotics.  He continues to have discomfort in multiple joints which includes his hands, right knee, bilateral ankles.  He came here for evaluation to see if he has some underlying autoimmune disease.  He was also given a prescription of allopurinol by his PCP but he did not take allopurinol.  There is no family history of  autoimmune disease.  Activities of Daily Living:  Patient reports morning stiffness for 1-2 hours.   Patient Denies nocturnal pain.  Difficulty dressing/grooming: Denies Difficulty climbing stairs: Reports Difficulty getting out of chair: Reports Difficulty using hands for taps, buttons, cutlery, and/or writing: Reports  Review of Systems  Constitutional:  Positive for fatigue.  HENT:  Negative for mouth sores, mouth dryness and nose dryness.   Eyes:  Negative for pain, itching and dryness.  Respiratory:  Positive for shortness of breath. Negative for difficulty breathing.   Cardiovascular:  Negative for chest pain and palpitations.  Gastrointestinal:  Negative for blood in stool, constipation and diarrhea.  Endocrine: Negative for increased urination.  Genitourinary:  Negative for difficulty urinating.  Musculoskeletal:  Positive for joint pain, joint pain and morning stiffness. Negative for joint swelling, myalgias, muscle tenderness and myalgias.  Skin:  Negative for color change, rash and redness.  Allergic/Immunologic: Negative for susceptible to infections.  Neurological:  Positive for numbness. Negative for dizziness, headaches, memory loss and weakness.  Hematological:  Negative for bruising/bleeding tendency.  Psychiatric/Behavioral:  Negative for confusion.    PMFS History:  Patient Active Problem List   Diagnosis Date Noted   Septic arthritis ([place]) 07/23/2020   Chronic pain syndrome    Cellulitis  of left lower extremity 07/18/2020   Normocytic anemia 07/18/2020   Hypocalcemia 07/18/2020   Essential hypertension 07/18/2020   Class 1 obesity 07/18/2020   Encounter for screening colonoscopy 07/30/2017   Prostate cancer ([place]) 11/29/2015   Postoperative anemia due to acute blood loss 05/25/2014   History of total knee arthroplasty 05/23/2014   H/O total knee replacement 02/22/2014    Past Medical History:  Diagnosis Date   Arthritis    Class 1 obesity 05/16/386    Complication of anesthesia    slow to wake up   Essential hypertension 07/18/2020   Hypertension    Hypertriglyceridemia    Numbness of fingers of both hands    Prostate cancer ([place]) 2017   Sleep apnea    CPAP    Family History  Problem Relation Age of Onset   Heart disease Mother    Throat cancer Father    Colon cancer Neg Hx    Colon polyps Neg Hx    Past Surgical History:  Procedure Laterality Date   ANKLE SURGERY Left    COLONOSCOPY WITH PROPOFOL N/A 09/28/2017   Procedure: COLONOSCOPY WITH PROPOFOL;  Surgeon: Daneil Dolin, MD;  Location[facility];  Service: Endoscopy;  Laterality: N/A;  12:00pm   HAND SURGERY Right 2003   cysts    JOINT REPLACEMENT     LESION REMOVAL Left 09/23/2013   Procedure: MINOR EXCISION 3 CM SKIN LESION OF LEFT THIGH ;  Surgeon: Jamesetta So, MD;  Location: [facility];  Service: General;  Laterality: Left;   LYMPHADENECTOMY Bilateral 11/29/2015   Procedure: LYMPHADENECTOMY;  Surgeon: Raynelle Bring, MD;  Location: [facility];  Service: Urology;  Laterality: Bilateral;   ROBOT ASSISTED LAPAROSCOPIC RADICAL PROSTATECTOMY N/A 11/29/2015   Procedure: XI ROBOTIC ASSISTED LAPAROSCOPIC RADICAL PROSTATECTOMY LEVEL 3;  Surgeon: Raynelle Bring, MD;  Location: [facility];  Service: Urology;  Laterality: N/A;   TOTAL KNEE ARTHROPLASTY Left 02/22/2014   Procedure: LEFT TOTAL KNEE ARTHROPLASTY;  Surgeon: Tobi Bastos, MD;  Location: [facility];  Service: Orthopedics;  Laterality: Left;   TOTAL KNEE ARTHROPLASTY Right 05/23/2014   Procedure: RIGHT TOTAL KNEE ARTHROPLASTY;  Surgeon: Latanya Maudlin, MD;  Location: [facility];  Service: Orthopedics;  Laterality: Right;   Social History   Social History Narrative   Not on file    There is no immunization history on file for this patient.   Objective: Vital Signs: BP (!) 181/91 (BP Location: Left Arm, Patient Position: Sitting, Cuff Size: Normal)   Pulse 67   Ht 6' (1.829 m)   Wt 244 lb (110.7 kg)   BMI 33.09 kg/m     Physical Exam Vitals and nursing note reviewed.  Constitutional:      Appearance: He is well-developed.  HENT:     Head: Normocephalic and atraumatic.  Eyes:     Conjunctiva/sclera: Conjunctivae normal.     Pupils: Pupils are equal, round, and reactive to light.  Cardiovascular:     Rate and Rhythm: Normal rate and regular rhythm.     Heart sounds: Normal heart sounds.  Pulmonary:     Effort: Pulmonary effort is normal.     Breath sounds: Normal breath sounds.  Abdominal:     General: Bowel sounds are normal.     Palpations: Abdomen is soft.  Musculoskeletal:        General: Swelling present.     Cervical back: Normal range of motion and neck supple.     Left lower leg: Edema  present.  Skin:    General: Skin is warm and dry.     Capillary Refill: Capillary refill takes less than 2 seconds.  Neurological:     Mental Status: He is alert and oriented to person, place, and time.  Psychiatric:        Behavior: Behavior normal.     Musculoskeletal Exam: C-spine was in good range of motion.  He had no tenderness over thoracic or lumbar spine or SI joints.  Shoulder joints, elbow joints, wrist joints with good range of motion.  He had bilateral CMC, PIP and DIP thickening with no synovitis.  Hip joints with good range of motion.  Bilateral knee joints are replaced and warm to touch.  He had a lot of difficulty with mobility due to his left ankle displaced fracture which is not aligned.  He also has cellulitis on his left ankle joint which is remarkably swollen.  He had discomfort with range of motion of his right ankle joint in his MTPs but no synovitis was noted.  CDAI Exam: CDAI Score: -- Patient Global: --; Provider Global: -- Swollen: --; Tender: -- Joint Exam 08/28/2020   No joint exam has been documented for this visit   There is currently no information documented on the homunculus. Go to the Rheumatology activity and complete the homunculus joint  exam.  Investigation: No additional findings.  Imaging: XR Ankle 2 Views Right  Result Date: 08/28/2020 Tibiotalar narrowing was noted.  Subtalar sclerosis was noted.  Inferior calcaneal spur was noted. Impression: These findings are consistent with osteoarthritis of the ankle.  XR Foot 2 Views Right  Result Date: 08/28/2020 First MTP, PIP and DIP narrowing was noted.  No intertarsal joint space narrowing was noted.  Dorsal spurring was noted.  Inferior calcaneal spurring was noted. Impression: These findings are consistent with osteoarthritis of the foot.  XR Hand 2 View Left  Result Date: 08/28/2020 Severe CMC narrowing and complete subluxation of the [facility] joint was noted.  PIP and DIP narrowing was noted.  No MCP, intercarpal or radiocarpal joint space narrowing was noted.  No other changes were noted. Impression: These findings are consistent with osteoarthritis of the hand.  XR Hand 2 View Right  Result Date: 08/28/2020 Severe CMC narrowing and subluxation was noted.  PIP and DIP narrowing was noted.  No MCP, intercarpal joint space narrowing was noted.  No erosive changes were noted. Impression: These findings are consistent with osteoarthritis of the hand.   Recent Labs: Lab Results  Component Value Date   WBC 9.6 07/28/2020   HGB 10.3 (L) 07/28/2020   PLT 449 (H) 07/28/2020   NA 139 07/28/2020   K 3.8 07/28/2020   CL 98 07/28/2020   CO2 31 07/28/2020   GLUCOSE 109 (H) 07/28/2020   BUN 7 07/28/2020   CREATININE 0.69 07/28/2020   BILITOT 0.5 07/28/2020   ALKPHOS 53 07/28/2020   AST 18 07/28/2020   ALT 17 07/28/2020   PROT 6.7 07/28/2020   ALBUMIN 2.2 (L) 07/28/2020   CALCIUM 9.0 07/28/2020   GFRAA >60 09/23/2017   July 28, 2020 ESR 118, CK27 July 24, 2020 synovial fluid aspiration showed WBC 35,000, crystals negative    Speciality Comments: No specialty comments available.  Procedures:  No procedures performed Allergies: Patient has no known allergies.    Assessment / Plan:     Visit Diagnoses: Pain in both hands -patient complains of pain and discomfort in his bilateral hands and stiffness.  No synovitis was  noted.  Bilateral PIP DIP and CMC joint thickening was noted which was consistent with osteoarthritis.  Plan: XR Hand 2 View Right, XR Hand 2 View Left, x-ray showed severe CMC narrowing and subluxation.  Osteoarthritis in bilateral hands was noted.  Rheumatoid factor, Cyclic citrul peptide antibody, IgG, Uric acid  History of total bilateral knee replacement-his bilateral knee joints are replaced.  He had right total knee replacement in 2010 and left total knee replacement in 2016.  He had warmth on palpation of his bilateral knee joints.  He continues to have discomfort in his right knee joint.  Pain in right ankle and joints of right foot -he complains of discomfort in his right ankle and right foot.  No synovitis was noted.  Plan: XR Ankle 2 Views Right, XR Foot 2 Views Right.  X-ray of the foot was consistent with osteoarthritis.  Osteoarthritic changes in the ankle joint were noted.  Septic arthritis of left ankle, due to unspecified organism (HCC)-he was diagnosed with cellulitis and septic arthritis of the left ankle joint.  He has PICC line and getting IV antibiotics.  He still have significant swelling in his left ankle and pedal edema.  He has a lot of discomfort and he has been using crutches to walk.  He states that he has been under care of Dr. Lucia Gaskins at [facility].  The plan was to have left ankle joint fusion but it was delayed due to the pandemic and COVID-19 infection.  The synovial fluid aspiration did not show any crystals while he was in the hospital.  Essential hypertension-blood pressure is elevated today which she states is due to pain.  Prostate cancer [facility]) - 2017, s/p radical prostatectomy  Normocytic anemia  Hypocalcemia  Chronic pain syndrome-he is currently on pain medications.  Orders: Orders Placed  This Encounter  Procedures   XR Hand 2 View Right   XR Hand 2 View Left   XR Ankle 2 Views Right   XR Foot 2 Views Right   Rheumatoid factor   Cyclic citrul peptide antibody, IgG   Uric acid    No orders of the defined types were placed in this encounter.    Follow-Up Instructions: Return for Pain in joints.   Bo Merino, MD  Note - This record has been created using Editor, commissioning.  Chart creation errors have been sought, but may not always  have been located. Such creation errors do not reflect on  the standard of medical care.

## 2020-08-16 DIAGNOSIS — L03116 Cellulitis of left lower limb: Secondary | ICD-10-CM | POA: Diagnosis not present

## 2020-08-16 DIAGNOSIS — M009 Pyogenic arthritis, unspecified: Secondary | ICD-10-CM | POA: Diagnosis not present

## 2020-08-17 ENCOUNTER — Inpatient Hospital Stay: Payer: Medicare HMO | Admitting: Infectious Diseases

## 2020-08-20 ENCOUNTER — Telehealth: Payer: Self-pay

## 2020-08-20 DIAGNOSIS — A419 Sepsis, unspecified organism: Secondary | ICD-10-CM | POA: Diagnosis not present

## 2020-08-20 DIAGNOSIS — M009 Pyogenic arthritis, unspecified: Secondary | ICD-10-CM | POA: Diagnosis not present

## 2020-08-20 DIAGNOSIS — M19072 Primary osteoarthritis, left ankle and foot: Secondary | ICD-10-CM | POA: Diagnosis not present

## 2020-08-20 DIAGNOSIS — M869 Osteomyelitis, unspecified: Secondary | ICD-10-CM | POA: Diagnosis not present

## 2020-08-20 NOTE — Telephone Encounter (Signed)
Received call about patient's PICC line. Last day of IV abx is today 7/11. [name] with [facility] called to get pull PICC orders after completion. Currently on Daptomycin and Ceftriaxone IV. Patient did not come to his follow up appointments and have no been seen since hospital discharge.   Forwarding to provider who saw in hospital and was scheduled for follow up.   Andrew Pickup Lorita Officer, RN

## 2020-08-20 NOTE — Telephone Encounter (Signed)
Verbal orders given to [facility] with [facility] to extend IV abx until 7/25. Attempted to contact patient to schedule follow up before end date. Will forward to pool for follow up.  Tarris Delbene Lorita Officer, RN

## 2020-08-21 DIAGNOSIS — G4733 Obstructive sleep apnea (adult) (pediatric): Secondary | ICD-10-CM | POA: Diagnosis not present

## 2020-08-21 DIAGNOSIS — I119 Hypertensive heart disease without heart failure: Secondary | ICD-10-CM | POA: Diagnosis not present

## 2020-08-21 DIAGNOSIS — A419 Sepsis, unspecified organism: Secondary | ICD-10-CM | POA: Diagnosis not present

## 2020-08-21 DIAGNOSIS — D72829 Elevated white blood cell count, unspecified: Secondary | ICD-10-CM | POA: Diagnosis not present

## 2020-08-21 DIAGNOSIS — I444 Left anterior fascicular block: Secondary | ICD-10-CM | POA: Diagnosis not present

## 2020-08-21 DIAGNOSIS — D510 Vitamin B12 deficiency anemia due to intrinsic factor deficiency: Secondary | ICD-10-CM | POA: Diagnosis not present

## 2020-08-21 DIAGNOSIS — M19072 Primary osteoarthritis, left ankle and foot: Secondary | ICD-10-CM | POA: Diagnosis not present

## 2020-08-21 DIAGNOSIS — M009 Pyogenic arthritis, unspecified: Secondary | ICD-10-CM | POA: Diagnosis not present

## 2020-08-21 DIAGNOSIS — M869 Osteomyelitis, unspecified: Secondary | ICD-10-CM | POA: Diagnosis not present

## 2020-08-21 NOTE — Telephone Encounter (Signed)
Patient scheduled with Dr. Linus Salmons on 08/31/20 and aware that IV antibiotics will be extended until 7/25. I have encouraged patient to make sure he keeps follow up appointment. Andrew Haley T [facility]

## 2020-08-22 DIAGNOSIS — A419 Sepsis, unspecified organism: Secondary | ICD-10-CM | POA: Diagnosis not present

## 2020-08-22 DIAGNOSIS — M869 Osteomyelitis, unspecified: Secondary | ICD-10-CM | POA: Diagnosis not present

## 2020-08-22 DIAGNOSIS — M009 Pyogenic arthritis, unspecified: Secondary | ICD-10-CM | POA: Diagnosis not present

## 2020-08-22 DIAGNOSIS — M19072 Primary osteoarthritis, left ankle and foot: Secondary | ICD-10-CM | POA: Diagnosis not present

## 2020-08-22 DIAGNOSIS — D510 Vitamin B12 deficiency anemia due to intrinsic factor deficiency: Secondary | ICD-10-CM | POA: Diagnosis not present

## 2020-08-22 DIAGNOSIS — G4733 Obstructive sleep apnea (adult) (pediatric): Secondary | ICD-10-CM | POA: Diagnosis not present

## 2020-08-22 DIAGNOSIS — I444 Left anterior fascicular block: Secondary | ICD-10-CM | POA: Diagnosis not present

## 2020-08-22 DIAGNOSIS — I119 Hypertensive heart disease without heart failure: Secondary | ICD-10-CM | POA: Diagnosis not present

## 2020-08-22 DIAGNOSIS — D72829 Elevated white blood cell count, unspecified: Secondary | ICD-10-CM | POA: Diagnosis not present

## 2020-08-27 ENCOUNTER — Telehealth: Payer: Self-pay

## 2020-08-27 DIAGNOSIS — A419 Sepsis, unspecified organism: Secondary | ICD-10-CM | POA: Diagnosis not present

## 2020-08-27 DIAGNOSIS — M869 Osteomyelitis, unspecified: Secondary | ICD-10-CM | POA: Diagnosis not present

## 2020-08-27 DIAGNOSIS — M009 Pyogenic arthritis, unspecified: Secondary | ICD-10-CM | POA: Diagnosis not present

## 2020-08-27 DIAGNOSIS — I444 Left anterior fascicular block: Secondary | ICD-10-CM | POA: Diagnosis not present

## 2020-08-27 DIAGNOSIS — M19072 Primary osteoarthritis, left ankle and foot: Secondary | ICD-10-CM | POA: Diagnosis not present

## 2020-08-27 DIAGNOSIS — D510 Vitamin B12 deficiency anemia due to intrinsic factor deficiency: Secondary | ICD-10-CM | POA: Diagnosis not present

## 2020-08-27 DIAGNOSIS — G4733 Obstructive sleep apnea (adult) (pediatric): Secondary | ICD-10-CM | POA: Diagnosis not present

## 2020-08-27 DIAGNOSIS — D72829 Elevated white blood cell count, unspecified: Secondary | ICD-10-CM | POA: Diagnosis not present

## 2020-08-27 DIAGNOSIS — I119 Hypertensive heart disease without heart failure: Secondary | ICD-10-CM | POA: Diagnosis not present

## 2020-08-27 NOTE — Telephone Encounter (Signed)
Received call from [place] at [facility], she states nursing says his PICC is out 7cm . Patient has not been seen in clinic yet, advised Melissa to page on call provider.   Beryle Flock, RN

## 2020-08-28 ENCOUNTER — Ambulatory Visit: Payer: Self-pay

## 2020-08-28 ENCOUNTER — Encounter: Payer: Self-pay | Admitting: Rheumatology

## 2020-08-28 ENCOUNTER — Ambulatory Visit: Payer: Medicare HMO | Admitting: Rheumatology

## 2020-08-28 ENCOUNTER — Other Ambulatory Visit: Payer: Self-pay

## 2020-08-28 ENCOUNTER — Telehealth: Payer: Self-pay

## 2020-08-28 VITALS — BP 181/91 | HR 67 | Ht 72.0 in | Wt 244.0 lb

## 2020-08-28 DIAGNOSIS — M25571 Pain in right ankle and joints of right foot: Secondary | ICD-10-CM

## 2020-08-28 DIAGNOSIS — Z96653 Presence of artificial knee joint, bilateral: Secondary | ICD-10-CM

## 2020-08-28 DIAGNOSIS — C61 Malignant neoplasm of prostate: Secondary | ICD-10-CM

## 2020-08-28 DIAGNOSIS — I10 Essential (primary) hypertension: Secondary | ICD-10-CM | POA: Diagnosis not present

## 2020-08-28 DIAGNOSIS — M009 Pyogenic arthritis, unspecified: Secondary | ICD-10-CM

## 2020-08-28 DIAGNOSIS — M79642 Pain in left hand: Secondary | ICD-10-CM | POA: Diagnosis not present

## 2020-08-28 DIAGNOSIS — M79641 Pain in right hand: Secondary | ICD-10-CM | POA: Diagnosis not present

## 2020-08-28 DIAGNOSIS — D649 Anemia, unspecified: Secondary | ICD-10-CM

## 2020-08-28 DIAGNOSIS — G894 Chronic pain syndrome: Secondary | ICD-10-CM

## 2020-08-28 NOTE — Telephone Encounter (Signed)
Advised patient of x-ray results from this morning, per Dr. Estanislado Pandy. Patient verbalized understanding.

## 2020-08-29 LAB — RHEUMATOID FACTOR: Rheumatoid fact SerPl-aCnc: <14 IU/mL (ref <14)

## 2020-08-29 LAB — CYCLIC CITRUL PEPTIDE ANTIBODY, IGG: Cyclic Citrullin Peptide Ab: <16 UNITS

## 2020-08-29 LAB — URIC ACID: Uric Acid, Serum: 3.9 mg/dL — ABNORMAL LOW (ref 4.0–8.0)

## 2020-08-29 NOTE — Progress Notes (Signed)
Rheumatoid factor and anti-CCP are negative.  Uric acid is normal.  I will discuss results at the follow-up visit.

## 2020-08-30 DIAGNOSIS — M009 Pyogenic arthritis, unspecified: Secondary | ICD-10-CM | POA: Diagnosis not present

## 2020-08-30 DIAGNOSIS — L03116 Cellulitis of left lower limb: Secondary | ICD-10-CM | POA: Diagnosis not present

## 2020-08-30 DIAGNOSIS — G894 Chronic pain syndrome: Secondary | ICD-10-CM | POA: Diagnosis not present

## 2020-08-31 ENCOUNTER — Emergency Department (HOSPITAL_COMMUNITY): Payer: Medicare HMO

## 2020-08-31 ENCOUNTER — Telehealth: Payer: Self-pay

## 2020-08-31 ENCOUNTER — Inpatient Hospital Stay: Payer: Medicare HMO | Admitting: Internal Medicine

## 2020-08-31 ENCOUNTER — Emergency Department (HOSPITAL_COMMUNITY)
Admission: EM | Admit: 2020-08-31 | Discharge: 2020-08-31 | Disposition: A | Discharge status: Home / Self Care | Payer: Medicare HMO | Attending: Emergency Medicine | Admitting: Emergency Medicine

## 2020-08-31 ENCOUNTER — Encounter (HOSPITAL_COMMUNITY): Payer: Self-pay | Admitting: Emergency Medicine

## 2020-08-31 ENCOUNTER — Ambulatory Visit: Payer: Medicare HMO | Admitting: Internal Medicine

## 2020-08-31 DIAGNOSIS — R188 Other ascites: Secondary | ICD-10-CM | POA: Diagnosis not present

## 2020-08-31 DIAGNOSIS — Z96653 Presence of artificial knee joint, bilateral: Secondary | ICD-10-CM | POA: Insufficient documentation

## 2020-08-31 DIAGNOSIS — R55 Syncope and collapse: Secondary | ICD-10-CM | POA: Diagnosis not present

## 2020-08-31 DIAGNOSIS — S82141A Displaced bicondylar fracture of right tibia, initial encounter for closed fracture: Secondary | ICD-10-CM | POA: Insufficient documentation

## 2020-08-31 DIAGNOSIS — S82144A Nondisplaced bicondylar fracture of right tibia, initial encounter for closed fracture: Secondary | ICD-10-CM | POA: Diagnosis not present

## 2020-08-31 DIAGNOSIS — R109 Unspecified abdominal pain: Secondary | ICD-10-CM | POA: Insufficient documentation

## 2020-08-31 DIAGNOSIS — Z79899 Other long term (current) drug therapy: Secondary | ICD-10-CM | POA: Insufficient documentation

## 2020-08-31 DIAGNOSIS — R079 Chest pain, unspecified: Secondary | ICD-10-CM | POA: Diagnosis not present

## 2020-08-31 DIAGNOSIS — Z041 Encounter for examination and observation following transport accident: Secondary | ICD-10-CM | POA: Diagnosis not present

## 2020-08-31 DIAGNOSIS — M25561 Pain in right knee: Secondary | ICD-10-CM | POA: Diagnosis not present

## 2020-08-31 DIAGNOSIS — J9811 Atelectasis: Secondary | ICD-10-CM | POA: Diagnosis not present

## 2020-08-31 DIAGNOSIS — S0003XA Contusion of scalp, initial encounter: Secondary | ICD-10-CM | POA: Diagnosis not present

## 2020-08-31 DIAGNOSIS — I10 Essential (primary) hypertension: Secondary | ICD-10-CM | POA: Diagnosis not present

## 2020-08-31 DIAGNOSIS — Y92410 Unspecified street and highway as the place of occurrence of the external cause: Secondary | ICD-10-CM | POA: Diagnosis not present

## 2020-08-31 DIAGNOSIS — M47812 Spondylosis without myelopathy or radiculopathy, cervical region: Secondary | ICD-10-CM | POA: Diagnosis not present

## 2020-08-31 DIAGNOSIS — Z8546 Personal history of malignant neoplasm of prostate: Secondary | ICD-10-CM | POA: Diagnosis not present

## 2020-08-31 DIAGNOSIS — Z452 Encounter for adjustment and management of vascular access device: Secondary | ICD-10-CM | POA: Diagnosis not present

## 2020-08-31 DIAGNOSIS — R52 Pain, unspecified: Secondary | ICD-10-CM

## 2020-08-31 DIAGNOSIS — S82141B Displaced bicondylar fracture of right tibia, initial encounter for open fracture type I or II: Secondary | ICD-10-CM | POA: Diagnosis not present

## 2020-08-31 DIAGNOSIS — R0789 Other chest pain: Secondary | ICD-10-CM | POA: Diagnosis not present

## 2020-08-31 DIAGNOSIS — R202 Paresthesia of skin: Secondary | ICD-10-CM | POA: Insufficient documentation

## 2020-08-31 DIAGNOSIS — S80911A Unspecified superficial injury of right knee, initial encounter: Secondary | ICD-10-CM | POA: Diagnosis present

## 2020-08-31 DIAGNOSIS — R1031 Right lower quadrant pain: Secondary | ICD-10-CM | POA: Diagnosis not present

## 2020-08-31 LAB — CBG MONITORING, ED: Glucose-Capillary: 101 mg/dL — ABNORMAL HIGH (ref 70–99)

## 2020-08-31 LAB — BASIC METABOLIC PANEL
Anion gap: 8 (ref 5–15)
BUN: 14 mg/dL (ref 6–20)
CO2: 27 mmol/L (ref 22–32)
Calcium: 8.7 mg/dL — ABNORMAL LOW (ref 8.9–10.3)
Chloride: 103 mmol/L (ref 98–111)
Creatinine, Ser: 0.74 mg/dL (ref 0.61–1.24)
GFR, Estimated: >60 mL/min (ref >60)
Glucose, Bld: 81 mg/dL (ref 70–99)
Potassium: 3.8 mmol/L (ref 3.5–5.1)
Sodium: 138 mmol/L (ref 135–145)

## 2020-08-31 LAB — CBC
HCT: 36.2 % — ABNORMAL LOW (ref 39.0–52.0)
Hemoglobin: 10.9 g/dL — ABNORMAL LOW (ref 13.0–17.0)
MCH: 25.2 pg — ABNORMAL LOW (ref 26.0–34.0)
MCHC: 30.1 g/dL (ref 30.0–36.0)
MCV: 83.6 fL (ref 80.0–100.0)
Platelets: 384 10*3/uL (ref 150–400)
RBC: 4.33 MIL/uL (ref 4.22–5.81)
RDW: 13.7 % (ref 11.5–15.5)
WBC: 8.2 10*3/uL (ref 4.0–10.5)
nRBC: 0 % (ref 0.0–0.2)

## 2020-08-31 LAB — URINALYSIS, ROUTINE W REFLEX MICROSCOPIC
Bilirubin Urine: NEGATIVE
Glucose, UA: NEGATIVE mg/dL
Hgb urine dipstick: NEGATIVE
Ketones, ur: NEGATIVE mg/dL
Leukocytes,Ua: NEGATIVE
Nitrite: NEGATIVE
Protein, ur: NEGATIVE mg/dL
Specific Gravity, Urine: 1.015 (ref 1.005–1.030)
pH: 5 (ref 5.0–8.0)

## 2020-08-31 MED ORDER — IOHEXOL 350 MG/ML SOLN
100.0000 mL | Freq: Once | INTRAVENOUS | Status: AC | PRN
  Administered 2020-08-31: 100 mL via INTRAVENOUS

## 2020-08-31 MED ORDER — MORPHINE SULFATE (PF) 4 MG/ML IV SOLN
4.0000 mg | Freq: Once | INTRAVENOUS | Status: AC
  Administered 2020-08-31: 4 mg via INTRAVENOUS
  Filled 2020-08-31: qty 1

## 2020-08-31 MED ORDER — ONDANSETRON HCL 4 MG/2ML IJ SOLN
4.0000 mg | Freq: Once | INTRAMUSCULAR | Status: AC
  Administered 2020-08-31: 4 mg via INTRAVENOUS
  Filled 2020-08-31: qty 2

## 2020-08-31 NOTE — Telephone Encounter (Signed)
CMA called [facility] to give verbal orders to [place] today and DC abx per Dr Linus Salmons. Patient no showed x 3 and has not been reachable via phone.
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
## 2020-08-31 NOTE — Progress Notes (Signed)
Orthopedic Tech Progress Note Patient Details:  Andrew Haley 1965-03-18 [id number]  Pt already had crutches with him at bedside so we did not give him a second pair.  Ortho Devices Type of Ortho Device: Knee Immobilizer, Ace wrap Ortho Device/Splint Location: RLE Ortho Device/Splint Interventions: Ordered, Application, Adjustment   Post Interventions Patient Tolerated: Well Instructions Provided: Care of device, Poper ambulation with device, Adjustment of device  Darcella Shiffman Jeri Modena 08/31/2020, 6:34 PM

## 2020-08-31 NOTE — ED Provider Notes (Signed)
Received a call from [facility] who recommended knee immobilizer, nonweightbearing and follow-up with Dr. Lanae Crumbly early next week.  Findings discussed with the patient.  He does have pain medicine at home that he can use.  He has a walker and various other things to get around his home.  He will be discharged to follow-up.  All other imaging is negative for acute pathology.   Blanchie Dessert, MD 08/31/20 [id number]

## 2020-08-31 NOTE — ED Notes (Signed)
Ortho called for placement of knee immobilizer
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
## 2020-09-03 ENCOUNTER — Telehealth: Payer: Self-pay
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
## 2020-09-03 NOTE — Telephone Encounter (Signed)
Spoke with Stanton Kidney at [facility] and notified her that Dr. Linus Salmons would like to continue with pull PICC orders.   Called patient, notified him that Dr. Linus Salmons would like for him to stop IV antibiotics and that nursing should be out to pull his PICC line. Reminded him of scheduled follow up. Patient verbalized understanding and has no further questions.   Beryle Flock, RN

## 2020-09-04 DIAGNOSIS — M19072 Primary osteoarthritis, left ankle and foot: Secondary | ICD-10-CM | POA: Diagnosis not present

## 2020-09-04 DIAGNOSIS — I444 Left anterior fascicular block: Secondary | ICD-10-CM | POA: Diagnosis not present

## 2020-09-04 DIAGNOSIS — I119 Hypertensive heart disease without heart failure: Secondary | ICD-10-CM | POA: Diagnosis not present

## 2020-09-04 DIAGNOSIS — G4733 Obstructive sleep apnea (adult) (pediatric): Secondary | ICD-10-CM | POA: Diagnosis not present

## 2020-09-04 DIAGNOSIS — A419 Sepsis, unspecified organism: Secondary | ICD-10-CM | POA: Diagnosis not present

## 2020-09-04 DIAGNOSIS — M009 Pyogenic arthritis, unspecified: Secondary | ICD-10-CM | POA: Diagnosis not present

## 2020-09-04 DIAGNOSIS — D510 Vitamin B12 deficiency anemia due to intrinsic factor deficiency: Secondary | ICD-10-CM | POA: Diagnosis not present

## 2020-09-04 DIAGNOSIS — D72829 Elevated white blood cell count, unspecified: Secondary | ICD-10-CM | POA: Diagnosis not present

## 2020-09-04 DIAGNOSIS — M869 Osteomyelitis, unspecified: Secondary | ICD-10-CM | POA: Diagnosis not present

## 2020-09-10 ENCOUNTER — Encounter: Payer: Self-pay | Admitting: Internal Medicine

## 2020-09-10 ENCOUNTER — Ambulatory Visit (INDEPENDENT_AMBULATORY_CARE_PROVIDER_SITE_OTHER): Payer: Medicare HMO | Admitting: Internal Medicine

## 2020-09-10 ENCOUNTER — Other Ambulatory Visit: Payer: Self-pay

## 2020-09-10 VITALS — BP 178/99 | HR 85 | Temp 98.3°F | Wt 243.0 lb

## 2020-09-10 DIAGNOSIS — Z5181 Encounter for therapeutic drug level monitoring: Secondary | ICD-10-CM | POA: Diagnosis not present

## 2020-09-10 DIAGNOSIS — Z452 Encounter for adjustment and management of vascular access device: Secondary | ICD-10-CM | POA: Diagnosis not present

## 2020-09-10 DIAGNOSIS — M009 Pyogenic arthritis, unspecified: Secondary | ICD-10-CM

## 2020-09-10 NOTE — Assessment & Plan Note (Signed)
Labs reviewed, no significant concerns

## 2020-09-10 NOTE — Assessment & Plan Note (Signed)
Septic arthritis of left ankle.  S;p treatment with broad coverage.  No surgical intervention done at the time, hopeful for medical cure.  Can observe off of antibiotics.  He understands if his ankle flares with heat, pain, he should see his primary orthopedist with concern for deep infection and need for surgery.   Otherwise he can follow up as needed

## 2020-09-10 NOTE — Assessment & Plan Note (Signed)
picc line now removed by my recommendation, done by home heatlh.
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
## 2020-09-26 ENCOUNTER — Ambulatory Visit: Payer: Medicare HMO | Admitting: Rheumatology

## 2020-09-26 DIAGNOSIS — D649 Anemia, unspecified: Secondary | ICD-10-CM

## 2020-09-26 DIAGNOSIS — G894 Chronic pain syndrome: Secondary | ICD-10-CM

## 2020-09-26 DIAGNOSIS — C61 Malignant neoplasm of prostate: Secondary | ICD-10-CM

## 2020-09-26 DIAGNOSIS — M009 Pyogenic arthritis, unspecified: Secondary | ICD-10-CM

## 2020-09-26 DIAGNOSIS — Z96653 Presence of artificial knee joint, bilateral: Secondary | ICD-10-CM

## 2020-09-26 DIAGNOSIS — M19041 Primary osteoarthritis, right hand: Secondary | ICD-10-CM

## 2020-09-26 DIAGNOSIS — M19071 Primary osteoarthritis, right ankle and foot: Secondary | ICD-10-CM

## 2020-09-26 DIAGNOSIS — I10 Essential (primary) hypertension: Secondary | ICD-10-CM

## 2020-09-28 DIAGNOSIS — E6609 Other obesity due to excess calories: Secondary | ICD-10-CM | POA: Diagnosis not present

## 2020-09-28 DIAGNOSIS — G894 Chronic pain syndrome: Secondary | ICD-10-CM | POA: Diagnosis not present

## 2020-09-28 DIAGNOSIS — Z6833 Body mass index (BMI) 33.0-33.9, adult: Secondary | ICD-10-CM | POA: Diagnosis not present

## 2020-10-21 NOTE — Progress Notes (Deleted)
Office Visit Note  Patient: Andrew Haley             Date of Birth: [birth year]           MRN: [id number]             PCP: Redmond School, MD Referring: Redmond School, MD Visit Date: 10/31/2020 Occupation: '@GUAROCC'$ @  Subjective:  No chief complaint on file.   History of Present Illness: Andrew Haley is a 55 y.o. male ***   Activities of Daily Living:  Patient reports morning stiffness for *** {minute/hour:19697}.   Patient {ACTIONS;DENIES/REPORTS:21021675::"Denies"} nocturnal pain.  Difficulty dressing/grooming: {ACTIONS;DENIES/REPORTS:21021675::"Denies"} Difficulty climbing stairs: {ACTIONS;DENIES/REPORTS:21021675::"Denies"} Difficulty getting out of chair: {ACTIONS;DENIES/REPORTS:21021675::"Denies"} Difficulty using hands for taps, buttons, cutlery, and/or writing: {ACTIONS;DENIES/REPORTS:21021675::"Denies"}  No Rheumatology ROS completed.   PMFS History:  Patient Active Problem List   Diagnosis Date Noted   Medication monitoring encounter 09/10/2020   PICC (peripherally inserted central catheter) removal 09/10/2020   Septic arthritis ([place]) 07/23/2020   Chronic pain syndrome    Cellulitis of left lower extremity 07/18/2020   Normocytic anemia 07/18/2020   Hypocalcemia 07/18/2020   Essential hypertension 07/18/2020   Class 1 obesity 07/18/2020   Encounter for screening colonoscopy 07/30/2017   Prostate cancer ([place]) 11/29/2015   Postoperative anemia due to acute blood loss 05/25/2014   History of total knee arthroplasty 05/23/2014   H/O total knee replacement 02/22/2014    Past Medical History:  Diagnosis Date   Arthritis    Class 1 obesity 123456   Complication of anesthesia    slow to wake up   Essential hypertension 07/18/2020   Hypertension    Hypertriglyceridemia    Numbness of fingers of both hands    Prostate cancer ([place]) 2017   Sleep apnea    CPAP    Family History  Problem Relation Age of Onset   Heart disease Mother    Throat cancer Father     Colon cancer Neg Hx    Colon polyps Neg Hx    Past Surgical History:  Procedure Laterality Date   ANKLE SURGERY Left    COLONOSCOPY WITH PROPOFOL N/A 09/28/2017   Procedure: COLONOSCOPY WITH PROPOFOL;  Surgeon: Daneil Dolin, MD;  [facility];  Service: Endoscopy;  Laterality: N/A;  12:00pm   HAND SURGERY Right 2003   cysts    JOINT REPLACEMENT     LESION REMOVAL Left 09/23/2013   Procedure: MINOR EXCISION 3 CM SKIN LESION OF LEFT THIGH ;  Surgeon: Jamesetta So, MD;  Location: [facility];  Service: General;  Laterality: Left;   LYMPHADENECTOMY Bilateral 11/29/2015   Procedure: LYMPHADENECTOMY;  Surgeon: Raynelle Bring, MD;  [facility];  Service: Urology;  Laterality: Bilateral;   ROBOT ASSISTED LAPAROSCOPIC RADICAL PROSTATECTOMY N/A 11/29/2015   Procedure: XI ROBOTIC ASSISTED LAPAROSCOPIC RADICAL PROSTATECTOMY LEVEL 3;  Surgeon: Raynelle Bring, MD;  [facility];  Service: Urology;  Laterality: N/A;   TOTAL KNEE ARTHROPLASTY Left 02/22/2014   Procedure: LEFT TOTAL KNEE ARTHROPLASTY;  Surgeon: Tobi Bastos, MD;  [facility];  Service: Orthopedics;  Laterality: Left;   TOTAL KNEE ARTHROPLASTY Right 05/23/2014   Procedure: RIGHT TOTAL KNEE ARTHROPLASTY;  Surgeon: Latanya Maudlin, MD;  [facility];  Service: Orthopedics;  Laterality: Right;   Social History   Social History Narrative   Not on file    There is no immunization history on file for this patient.   Objective: Vital Signs: There were no vitals taken  for this visit.   Physical Exam   Musculoskeletal Exam: ***  CDAI Exam: CDAI Score: -- Patient Global: --; Provider Global: -- Swollen: --; Tender: -- Joint Exam 10/31/2020   No joint exam has been documented for this visit   There is currently no information documented on the homunculus. Go to the Rheumatology activity and complete the homunculus joint exam.  Investigation: No additional findings.  Imaging: No results  found.  Recent Labs: Lab Results  Component Value Date   WBC 8.2 08/31/2020   HGB 10.9 (L) 08/31/2020   PLT 384 08/31/2020   NA 138 08/31/2020   K 3.8 08/31/2020   CL 103 08/31/2020   CO2 27 08/31/2020   GLUCOSE 81 08/31/2020   BUN 14 08/31/2020   CREATININE 0.74 08/31/2020   BILITOT 0.5 07/28/2020   ALKPHOS 53 07/28/2020   AST 18 07/28/2020   ALT 17 07/28/2020   PROT 6.7 07/28/2020   ALBUMIN 2.2 (L) 07/28/2020   CALCIUM 8.7 (L) 08/31/2020   GFRAA >60 09/23/2017   08/28/20 RF negative,anti-CCP negative, uric acid 3.9  Speciality Comments: No specialty comments available.  Procedures:  No procedures performed Allergies: Patient has no known allergies.   Assessment / Plan:     Visit Diagnoses: Primary osteoarthritis of both hands - Clinical and radiographic findings are consistent with osteoarthritis.  RF negative, anti-CCP negative, uric acid normal.  History of total bilateral knee replacement - Right total knee replacement 2010, left total knee replacement 2016.  Pain in right ankle and joints of right foot - Osteoarthritic changes were noted on the x-ray.  Septic arthritis of left ankle, due to unspecified organism [facility]) - LAJ surgery by Dr. Lucia Gaskins 2020. H/o septic arthritis being treated with IV Abx. He has persistent swelling and edema .  Synovial fluid negative for crystals.  Chronic pain syndrome  Essential hypertension  Prostate cancer ([place]) - 2017, status post radical prostatectomy  Normocytic anemia  Hypocalcemia  Orders: No orders of the defined types were placed in this encounter.  No orders of the defined types were placed in this encounter.   Face-to-face time spent with patient was *** minutes. Greater than 50% of time was spent in counseling and coordination of care.  Follow-Up Instructions: No follow-ups on file.   Bo Merino, MD  Note - This record has been created using Editor, commissioning.  Chart creation errors have been sought,  but may not always  have been located. Such creation errors do not reflect on  the standard of medical care.

## 2020-10-26 DIAGNOSIS — E6609 Other obesity due to excess calories: Secondary | ICD-10-CM | POA: Diagnosis not present

## 2020-10-26 DIAGNOSIS — G894 Chronic pain syndrome: Secondary | ICD-10-CM | POA: Diagnosis not present

## 2020-10-26 DIAGNOSIS — Z6833 Body mass index (BMI) 33.0-33.9, adult: Secondary | ICD-10-CM | POA: Diagnosis not present

## 2020-10-26 DIAGNOSIS — Z23 Encounter for immunization: Secondary | ICD-10-CM | POA: Diagnosis not present

## 2020-10-31 ENCOUNTER — Ambulatory Visit: Payer: Medicare HMO | Admitting: Rheumatology

## 2020-10-31 DIAGNOSIS — G894 Chronic pain syndrome: Secondary | ICD-10-CM

## 2020-10-31 DIAGNOSIS — C61 Malignant neoplasm of prostate: Secondary | ICD-10-CM

## 2020-10-31 DIAGNOSIS — Z96653 Presence of artificial knee joint, bilateral: Secondary | ICD-10-CM

## 2020-10-31 DIAGNOSIS — M19041 Primary osteoarthritis, right hand: Secondary | ICD-10-CM

## 2020-10-31 DIAGNOSIS — D649 Anemia, unspecified: Secondary | ICD-10-CM

## 2020-10-31 DIAGNOSIS — I10 Essential (primary) hypertension: Secondary | ICD-10-CM

## 2020-10-31 DIAGNOSIS — M25571 Pain in right ankle and joints of right foot: Secondary | ICD-10-CM

## 2020-10-31 DIAGNOSIS — M009 Pyogenic arthritis, unspecified: Secondary | ICD-10-CM

## 2020-11-16 ENCOUNTER — Other Ambulatory Visit: Payer: Self-pay

## 2020-11-16 ENCOUNTER — Emergency Department (HOSPITAL_COMMUNITY): Payer: Medicare HMO

## 2020-11-16 ENCOUNTER — Encounter (HOSPITAL_COMMUNITY): Payer: Self-pay | Admitting: *Deleted

## 2020-11-16 ENCOUNTER — Emergency Department (HOSPITAL_COMMUNITY)
Admission: EM | Admit: 2020-11-16 | Discharge: 2020-11-16 | Disposition: A | Discharge status: Home / Self Care | Payer: Medicare HMO | Attending: Emergency Medicine | Admitting: Emergency Medicine

## 2020-11-16 DIAGNOSIS — R791 Abnormal coagulation profile: Secondary | ICD-10-CM | POA: Diagnosis not present

## 2020-11-16 DIAGNOSIS — S8992XA Unspecified injury of left lower leg, initial encounter: Secondary | ICD-10-CM | POA: Diagnosis not present

## 2020-11-16 DIAGNOSIS — Y92410 Unspecified street and highway as the place of occurrence of the external cause: Secondary | ICD-10-CM | POA: Diagnosis not present

## 2020-11-16 DIAGNOSIS — F419 Anxiety disorder, unspecified: Secondary | ICD-10-CM | POA: Insufficient documentation

## 2020-11-16 DIAGNOSIS — Z9079 Acquired absence of other genital organ(s): Secondary | ICD-10-CM | POA: Diagnosis not present

## 2020-11-16 DIAGNOSIS — F1910 Other psychoactive substance abuse, uncomplicated: Secondary | ICD-10-CM | POA: Diagnosis not present

## 2020-11-16 DIAGNOSIS — S3993XA Unspecified injury of pelvis, initial encounter: Secondary | ICD-10-CM | POA: Diagnosis not present

## 2020-11-16 DIAGNOSIS — R4020 Unspecified coma: Secondary | ICD-10-CM | POA: Diagnosis not present

## 2020-11-16 DIAGNOSIS — Z20822 Contact with and (suspected) exposure to covid-19: Secondary | ICD-10-CM | POA: Insufficient documentation

## 2020-11-16 DIAGNOSIS — F1410 Cocaine abuse, uncomplicated: Secondary | ICD-10-CM | POA: Diagnosis not present

## 2020-11-16 DIAGNOSIS — S2242XA Multiple fractures of ribs, left side, initial encounter for closed fracture: Secondary | ICD-10-CM | POA: Diagnosis not present

## 2020-11-16 DIAGNOSIS — Y900 Blood alcohol level of less than 20 mg/100 ml: Secondary | ICD-10-CM | POA: Insufficient documentation

## 2020-11-16 DIAGNOSIS — Z79899 Other long term (current) drug therapy: Secondary | ICD-10-CM | POA: Diagnosis not present

## 2020-11-16 DIAGNOSIS — S3991XA Unspecified injury of abdomen, initial encounter: Secondary | ICD-10-CM | POA: Diagnosis not present

## 2020-11-16 DIAGNOSIS — R0902 Hypoxemia: Secondary | ICD-10-CM | POA: Insufficient documentation

## 2020-11-16 DIAGNOSIS — I10 Essential (primary) hypertension: Secondary | ICD-10-CM | POA: Insufficient documentation

## 2020-11-16 DIAGNOSIS — Z041 Encounter for examination and observation following transport accident: Secondary | ICD-10-CM | POA: Diagnosis not present

## 2020-11-16 DIAGNOSIS — S199XXA Unspecified injury of neck, initial encounter: Secondary | ICD-10-CM | POA: Diagnosis not present

## 2020-11-16 DIAGNOSIS — Z8546 Personal history of malignant neoplasm of prostate: Secondary | ICD-10-CM | POA: Insufficient documentation

## 2020-11-16 DIAGNOSIS — Z96653 Presence of artificial knee joint, bilateral: Secondary | ICD-10-CM | POA: Insufficient documentation

## 2020-11-16 DIAGNOSIS — M47812 Spondylosis without myelopathy or radiculopathy, cervical region: Secondary | ICD-10-CM | POA: Insufficient documentation

## 2020-11-16 DIAGNOSIS — S8991XA Unspecified injury of right lower leg, initial encounter: Secondary | ICD-10-CM | POA: Insufficient documentation

## 2020-11-16 DIAGNOSIS — T50904A Poisoning by unspecified drugs, medicaments and biological substances, undetermined, initial encounter: Secondary | ICD-10-CM | POA: Diagnosis not present

## 2020-11-16 DIAGNOSIS — T1490XA Injury, unspecified, initial encounter: Secondary | ICD-10-CM

## 2020-11-16 DIAGNOSIS — R739 Hyperglycemia, unspecified: Secondary | ICD-10-CM | POA: Diagnosis not present

## 2020-11-16 DIAGNOSIS — R404 Transient alteration of awareness: Secondary | ICD-10-CM | POA: Diagnosis not present

## 2020-11-16 DIAGNOSIS — S0990XA Unspecified injury of head, initial encounter: Secondary | ICD-10-CM | POA: Diagnosis not present

## 2020-11-16 DIAGNOSIS — R55 Syncope and collapse: Secondary | ICD-10-CM | POA: Diagnosis not present

## 2020-11-16 DIAGNOSIS — R092 Respiratory arrest: Secondary | ICD-10-CM | POA: Diagnosis not present

## 2020-11-16 LAB — COMPREHENSIVE METABOLIC PANEL
ALT: 19 U/L (ref 0–44)
AST: 32 U/L (ref 15–41)
Albumin: 3.9 g/dL (ref 3.5–5.0)
Alkaline Phosphatase: 99 U/L (ref 38–126)
Anion gap: 7 (ref 5–15)
BUN: 16 mg/dL (ref 6–20)
CO2: 27 mmol/L (ref 22–32)
Calcium: 8.2 mg/dL — ABNORMAL LOW (ref 8.9–10.3)
Chloride: 103 mmol/L (ref 98–111)
Creatinine, Ser: 0.81 mg/dL (ref 0.61–1.24)
GFR, Estimated: >60 mL/min (ref >60)
Glucose, Bld: 161 mg/dL — ABNORMAL HIGH (ref 70–99)
Potassium: 3.6 mmol/L (ref 3.5–5.1)
Sodium: 137 mmol/L (ref 135–145)
Total Bilirubin: 0.7 mg/dL (ref 0.3–1.2)
Total Protein: 7.3 g/dL (ref 6.5–8.1)

## 2020-11-16 LAB — URINALYSIS, ROUTINE W REFLEX MICROSCOPIC
Bacteria, UA: NONE SEEN
Bilirubin Urine: NEGATIVE
Glucose, UA: 150 mg/dL — AB
Hgb urine dipstick: NEGATIVE
Ketones, ur: NEGATIVE mg/dL
Leukocytes,Ua: NEGATIVE
Nitrite: NEGATIVE
Protein, ur: 100 mg/dL — AB
Specific Gravity, Urine: 1.024 (ref 1.005–1.030)
pH: 5 (ref 5.0–8.0)

## 2020-11-16 LAB — CBC
HCT: 34.6 % — ABNORMAL LOW (ref 39.0–52.0)
Hemoglobin: 10.8 g/dL — ABNORMAL LOW (ref 13.0–17.0)
MCH: 25.2 pg — ABNORMAL LOW (ref 26.0–34.0)
MCHC: 31.2 g/dL (ref 30.0–36.0)
MCV: 80.8 fL (ref 80.0–100.0)
Platelets: 275 10*3/uL (ref 150–400)
RBC: 4.28 MIL/uL (ref 4.22–5.81)
RDW: 14 % (ref 11.5–15.5)
WBC: 7.1 10*3/uL (ref 4.0–10.5)
nRBC: 0 % (ref 0.0–0.2)

## 2020-11-16 LAB — LACTIC ACID, PLASMA: Lactic Acid, Venous: 1.2 mmol/L (ref 0.5–1.9)

## 2020-11-16 LAB — RAPID URINE DRUG SCREEN, HOSP PERFORMED
Amphetamines: NOT DETECTED
Barbiturates: NOT DETECTED
Benzodiazepines: NOT DETECTED
Cocaine: POSITIVE — AB
Opiates: NOT DETECTED
Tetrahydrocannabinol: NOT DETECTED

## 2020-11-16 LAB — PROTIME-INR
INR: 1 (ref 0.8–1.2)
Prothrombin Time: 13.5 seconds (ref 11.4–15.2)

## 2020-11-16 LAB — RESP PANEL BY RT-PCR (FLU A&B, COVID) ARPGX2
Influenza A by PCR: NEGATIVE
Influenza B by PCR: NEGATIVE
SARS Coronavirus 2 by RT PCR: NEGATIVE

## 2020-11-16 LAB — ETHANOL: Alcohol, Ethyl (B): <10 mg/dL (ref <10)

## 2020-11-16 MED ORDER — SODIUM CHLORIDE 0.9 % IV SOLN: INTRAVENOUS | Status: DC

## 2020-11-16 MED ORDER — SODIUM CHLORIDE 0.9 % IV BOLUS
1000.0000 mL | Freq: Once | INTRAVENOUS | Status: AC
  Administered 2020-11-16: 1000 mL via INTRAVENOUS

## 2020-11-16 MED ORDER — IOHEXOL 300 MG/ML  SOLN
100.0000 mL | Freq: Once | INTRAMUSCULAR | Status: AC | PRN
  Administered 2020-11-16: 100 mL via INTRAVENOUS

## 2020-11-16 NOTE — ED Notes (Signed)
Oxygen applied while pt sleeping due to oxygen lowers while sleeping. Oxygen raises easily while awake. 2 lpm
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
## 2020-11-16 NOTE — ED Notes (Signed)
Stormy Card wilson friend [phone] call if he needs ride or anything
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
## 2020-11-16 NOTE — ED Notes (Signed)
Pt's friend Dorice Lamas to be here in 20 minutes to pick up

## 2020-11-16 NOTE — ED Notes (Signed)
Pt ambulated with crutches slowly with steady gait. O2 Sats ranged 93-97% on room air

## 2020-11-26 DIAGNOSIS — I10 Essential (primary) hypertension: Secondary | ICD-10-CM | POA: Diagnosis not present

## 2020-11-26 DIAGNOSIS — G894 Chronic pain syndrome: Secondary | ICD-10-CM | POA: Diagnosis not present

## 2020-11-26 DIAGNOSIS — M170 Bilateral primary osteoarthritis of knee: Secondary | ICD-10-CM | POA: Diagnosis not present

## 2020-12-12 DIAGNOSIS — N393 Stress incontinence (female) (male): Secondary | ICD-10-CM | POA: Diagnosis not present

## 2020-12-12 DIAGNOSIS — C61 Malignant neoplasm of prostate: Secondary | ICD-10-CM | POA: Diagnosis not present

## 2020-12-12 DIAGNOSIS — N5201 Erectile dysfunction due to arterial insufficiency: Secondary | ICD-10-CM | POA: Diagnosis not present

## 2020-12-26 DIAGNOSIS — G894 Chronic pain syndrome: Secondary | ICD-10-CM | POA: Diagnosis not present

## 2020-12-26 DIAGNOSIS — C61 Malignant neoplasm of prostate: Secondary | ICD-10-CM | POA: Diagnosis not present

## 2020-12-26 DIAGNOSIS — M170 Bilateral primary osteoarthritis of knee: Secondary | ICD-10-CM | POA: Diagnosis not present

## 2021-01-25 DIAGNOSIS — E6609 Other obesity due to excess calories: Secondary | ICD-10-CM | POA: Diagnosis not present

## 2021-01-25 DIAGNOSIS — Z6834 Body mass index (BMI) 34.0-34.9, adult: Secondary | ICD-10-CM | POA: Diagnosis not present

## 2021-01-25 DIAGNOSIS — C61 Malignant neoplasm of prostate: Secondary | ICD-10-CM | POA: Diagnosis not present

## 2021-01-25 DIAGNOSIS — M1991 Primary osteoarthritis, unspecified site: Secondary | ICD-10-CM | POA: Diagnosis not present

## 2021-01-25 DIAGNOSIS — L739 Follicular disorder, unspecified: Secondary | ICD-10-CM | POA: Diagnosis not present

## 2021-01-25 DIAGNOSIS — G894 Chronic pain syndrome: Secondary | ICD-10-CM | POA: Diagnosis not present

## 2021-02-13 DIAGNOSIS — Z96651 Presence of right artificial knee joint: Secondary | ICD-10-CM | POA: Diagnosis not present

## 2021-02-13 DIAGNOSIS — M25561 Pain in right knee: Secondary | ICD-10-CM | POA: Diagnosis not present

## 2021-02-14 DIAGNOSIS — G894 Chronic pain syndrome: Secondary | ICD-10-CM | POA: Diagnosis not present

## 2021-02-14 DIAGNOSIS — Z0001 Encounter for general adult medical examination with abnormal findings: Secondary | ICD-10-CM | POA: Diagnosis not present

## 2021-02-14 DIAGNOSIS — T84012A Broken internal right knee prosthesis, initial encounter: Secondary | ICD-10-CM | POA: Diagnosis not present

## 2021-02-14 DIAGNOSIS — M868X7 Other osteomyelitis, ankle and foot: Secondary | ICD-10-CM | POA: Diagnosis not present

## 2021-02-14 DIAGNOSIS — E782 Mixed hyperlipidemia: Secondary | ICD-10-CM | POA: Diagnosis not present

## 2021-02-14 DIAGNOSIS — C61 Malignant neoplasm of prostate: Secondary | ICD-10-CM | POA: Diagnosis not present

## 2021-02-14 DIAGNOSIS — I10 Essential (primary) hypertension: Secondary | ICD-10-CM | POA: Diagnosis not present

## 2021-02-14 DIAGNOSIS — Z6834 Body mass index (BMI) 34.0-34.9, adult: Secondary | ICD-10-CM | POA: Diagnosis not present

## 2021-02-14 DIAGNOSIS — M170 Bilateral primary osteoarthritis of knee: Secondary | ICD-10-CM | POA: Diagnosis not present

## 2021-02-14 DIAGNOSIS — E669 Obesity, unspecified: Secondary | ICD-10-CM | POA: Diagnosis not present

## 2021-02-20 DIAGNOSIS — M25561 Pain in right knee: Secondary | ICD-10-CM | POA: Diagnosis not present

## 2021-02-20 DIAGNOSIS — Z96651 Presence of right artificial knee joint: Secondary | ICD-10-CM | POA: Diagnosis not present

## 2021-02-20 DIAGNOSIS — T84099A Other mechanical complication of unspecified internal joint prosthesis, initial encounter: Secondary | ICD-10-CM | POA: Diagnosis not present

## 2021-02-21 NOTE — Progress Notes (Signed)
Sent message, via epic in basket, requesting orders in epic from surgeon.  

## 2021-02-27 NOTE — Patient Instructions (Addendum)
DUE TO COVID-19 ONLY ONE VISITOR IS ALLOWED TO COME WITH YOU AND STAY IN THE WAITING ROOM ONLY DURING PRE OP AND PROCEDURE.   **NO VISITORS ARE ALLOWED IN THE SHORT STAY AREA OR RECOVERY ROOM!!**  IF YOU WILL BE ADMITTED INTO THE HOSPITAL YOU ARE ALLOWED ONLY TWO SUPPORT PEOPLE DURING VISITATION HOURS ONLY (7 AM -8PM)   The support person(s) must pass our screening, gel in and out, and wear a mask at all times, including in the patients room. Patients must also wear a mask when staff or their support person are in the room. Visitors GUEST BADGE MUST BE WORN VISIBLY  One adult visitor may remain with you overnight and MUST be in the room by 8 P.M.  No visitors under the age of 75. Any visitor under the age of 25 must be accompanied by an adult.    COVID SWAB TESTING MUST BE COMPLETED ON:  02/28/21 @ 9:00 AM   Site: [facility] [place] Enter: Main Entrance have a seat in the waiting area to the right of main entrance (DO NOT Natalia!!!!!) Dial: [phone] to alert staff you have arrived  You are not required to quarantine, however you are required to wear a well-fitted mask when you are out and around people not in your household.  Hand Hygiene often Do NOT share personal items Notify your provider if you are in close contact with someone who has COVID or you develop fever 100.4 or greater, new onset of sneezing, cough, sore throat, shortness of breath or body aches.       Your procedure is scheduled on: 03/04/21   Report to [facility] Main Entrance    Report to admitting at 11:15 AM   Call this number if you have problems the morning of surgery [phone]   Do not eat food :After Midnight.   May have liquids until 10:30 AM day of surgery  CLEAR LIQUID DIET  Foods Allowed                                                                     Foods Excluded  Water, Black Coffee and tea (no milk or creamer)            liquids that you cannot  Plain Jell-O in any flavor  (No red)                                    see through such as: Fruit ices (not with fruit pulp)                                            milk, soups, orange juice              Iced Popsicles (No red)  All solid food                                   Apple juices Sports drinks like Gatorade (No red) Lightly seasoned clear broth or consume(fat free) Sugar     The day of surgery:  Drink ONE (1) Pre-Surgery Clear Ensure at 10:30 AM the morning of surgery. Drink in one sitting. Do not sip.  This drink was given to you during your hospital  pre-op appointment visit. Nothing else to drink after completing the  Pre-Surgery Clear Ensure.          If you have questions, please contact your surgeons office.     Oral Hygiene is also important to reduce your risk of infection.                                    Remember - BRUSH YOUR TEETH THE MORNING OF SURGERY WITH YOUR REGULAR TOOTHPASTE   Take these medicines the morning of surgery with A SIP OF WATER: Percocet                              You may not have any metal on your body including jewelry, and body piercing             Do not wear lotions, powders, cologne, or deodorant              Men may shave face and neck.   Do not bring valuables to the hospital. [place].   Contacts, dentures or bridgework may not be worn into surgery.   Bring CPAP mask and tubing day of surgery   Bring small overnight bag day of surgery.              Please read over the following fact sheets you were given: IF YOU HAVE QUESTIONS ABOUT YOUR PRE-OP INSTRUCTIONS PLEASE CALL [facility] - Preparing for Surgery Before surgery, you can play an important role.  Because skin is not sterile, your skin needs to be as free of germs as possible.  You can reduce the number of germs on  your skin by washing with CHG (chlorahexidine gluconate) soap before surgery.  CHG is an antiseptic cleaner which kills germs and bonds with the skin to continue killing germs even after washing. Please DO NOT use if you have an allergy to CHG or antibacterial soaps.  If your skin becomes reddened/irritated stop using the CHG and inform your nurse when you arrive at Short Stay. Do not shave (including legs and underarms) for at least 48 hours prior to the first CHG shower.  You may shave your face/neck.  Please follow these instructions carefully:  1.  Shower with CHG Soap the night before surgery and the  morning of surgery.  2.  If you choose to wash your hair, wash your hair first as usual with your normal  shampoo.  3.  After you shampoo, rinse your hair and body thoroughly to remove the shampoo.  4.  Use CHG as you would any other liquid soap.  You can apply chg directly to the skin and wash.  Gently with a scrungie or clean washcloth.  5.  Apply the CHG Soap to your body ONLY FROM THE NECK DOWN.   Do   not use on face/ open                           Wound or open sores. Avoid contact with eyes, ears mouth and   genitals (private parts).                       Wash face,  Genitals (private parts) with your normal soap.             6.  Wash thoroughly, paying special attention to the area where your    surgery  will be performed.  7.  Thoroughly rinse your body with warm water from the neck down.  8.  DO NOT shower/wash with your normal soap after using and rinsing off the CHG Soap.                9.  Pat yourself dry with a clean towel.            10.  Wear clean pajamas.            11.  Place clean sheets on your bed the night of your first shower and do not  sleep with pets. Day of Surgery : Do not apply any lotions/deodorants the morning of surgery.  Please wear clean clothes to the hospital/surgery center.  FAILURE TO FOLLOW THESE INSTRUCTIONS MAY RESULT IN THE  CANCELLATION OF YOUR SURGERY  PATIENT SIGNATURE_________________________________  NURSE SIGNATURE__________________________________  ________________________________________________________________________   Andrew Haley  An incentive spirometer is a tool that can help keep your lungs clear and active. This tool measures how well you are filling your lungs with each breath. Taking long deep breaths may help reverse or decrease the chance of developing breathing (pulmonary) problems (especially infection) following: A long period of time when you are unable to move or be active. BEFORE THE PROCEDURE  If the spirometer includes an indicator to show your best effort, your nurse or respiratory therapist will set it to a desired goal. If possible, sit up straight or lean slightly forward. Try not to slouch. Hold the incentive spirometer in an upright position. INSTRUCTIONS FOR USE  Sit on the edge of your bed if possible, or sit up as far as you can in bed or on a chair. Hold the incentive spirometer in an upright position. Breathe out normally. Place the mouthpiece in your mouth and seal your lips tightly around it. Breathe in slowly and as deeply as possible, raising the piston or the ball toward the top of the column. Hold your breath for 3-5 seconds or for as long as possible. Allow the piston or ball to fall to the bottom of the column. Remove the mouthpiece from your mouth and breathe out normally. Rest for a few seconds and repeat Steps 1 through 7 at least 10 times every 1-2 hours when you are awake. Take your time and take a few normal breaths between deep breaths. The spirometer may include an indicator to show your best effort. Use the indicator as a goal to work toward during each repetition. After each set of 10 deep breaths, practice coughing to be sure your  lungs are clear. If you have an incision (the cut made at the time of surgery), support your incision when coughing  by placing a pillow or rolled up towels firmly against it. Once you are able to get out of bed, walk around indoors and cough well. You may stop using the incentive spirometer when instructed by your caregiver.  RISKS AND COMPLICATIONS Take your time so you do not get dizzy or light-headed. If you are in pain, you may need to take or ask for pain medication before doing incentive spirometry. It is harder to take a deep breath if you are having pain. AFTER USE Rest and breathe slowly and easily. It can be helpful to keep track of a log of your progress. Your caregiver can provide you with a simple table to help with this. If you are using the spirometer at home, follow these instructions: [place] IF:  You are having difficultly using the spirometer. You have trouble using the spirometer as often as instructed. Your pain medication is not giving enough relief while using the spirometer. You develop fever of 100.5 F (38.1 C) or higher. SEEK IMMEDIATE MEDICAL CARE IF:  You cough up bloody sputum that had not been present before. You develop fever of 102 F (38.9 C) or greater. You develop worsening pain at or near the incision site. MAKE SURE YOU:  Understand these instructions. Will watch your condition. Will get help right away if you are not doing well or get worse. Document Released: 06/09/2006 Document Revised: 04/21/2011 Document Reviewed: 08/10/2006 Exit[employer] Patient Information 2014 ExitCare, [place].   ________________________________________________________________________  WHAT IS A BLOOD TRANSFUSION? Blood Transfusion Information  A transfusion is the replacement of blood or some of its parts. Blood is made up of multiple cells which provide different functions. Red blood cells carry oxygen and are used for blood loss replacement. White blood cells fight against infection. Platelets control bleeding. Plasma helps clot blood. Other blood products are available for  specialized needs, such as hemophilia or other clotting disorders. BEFORE THE TRANSFUSION  Who gives blood for transfusions?  Healthy volunteers who are fully evaluated to make sure their blood is safe. This is blood bank blood. Transfusion therapy is the safest it has ever been in the practice of medicine. Before blood is taken from a donor, a complete history is taken to make sure that person has no history of diseases nor engages in risky social behavior (examples are intravenous drug use or sexual activity with multiple partners). The donor's travel history is screened to minimize risk of transmitting infections, such as malaria. The donated blood is tested for signs of infectious diseases, such as HIV and hepatitis. The blood is then tested to be sure it is compatible with you in order to minimize the chance of a transfusion reaction. If you or a relative donates blood, this is often done in anticipation of surgery and is not appropriate for emergency situations. It takes many days to process the donated blood. RISKS AND COMPLICATIONS Although transfusion therapy is very safe and saves many lives, the main dangers of transfusion include:  Getting an infectious disease. Developing a transfusion reaction. This is an allergic reaction to something in the blood you were given. Every precaution is taken to prevent this. The decision to have a blood transfusion has been considered carefully by your caregiver before blood is given. Blood is not given unless the benefits outweigh the risks. AFTER THE TRANSFUSION Right after receiving a blood transfusion,  you will usually feel much better and more energetic. This is especially true if your red blood cells have gotten low (anemic). The transfusion raises the level of the red blood cells which carry oxygen, and this usually causes an energy increase. The nurse administering the transfusion will monitor you carefully for complications. HOME CARE INSTRUCTIONS   No special instructions are needed after a transfusion. You may find your energy is better. Speak with your caregiver about any limitations on activity for underlying diseases you may have. SEEK MEDICAL CARE IF:  Your condition is not improving after your transfusion. You develop redness or irritation at the intravenous (IV) site. SEEK IMMEDIATE MEDICAL CARE IF:  Any of the following symptoms occur over the next 12 hours: Shaking chills. You have a temperature by mouth above 102 F (38.9 C), not controlled by medicine. Chest, back, or muscle pain. People around you feel you are not acting correctly or are confused. Shortness of breath or difficulty breathing. Dizziness and fainting. You get a rash or develop hives. You have a decrease in urine output. Your urine turns a dark color or changes to pink, red, or brown. Any of the following symptoms occur over the next 10 days: You have a temperature by mouth above 102 F (38.9 C), not controlled by medicine. Shortness of breath. Weakness after normal activity. The white part of the eye turns yellow (jaundice). You have a decrease in the amount of urine or are urinating less often. Your urine turns a dark color or changes to pink, red, or brown. Document Released: 01/25/2000 Document Revised: 04/21/2011 Document Reviewed: 09/13/2007 [facility] Patient Information 2014 [place], [place].  _______________________________________________________________________

## 2021-02-27 NOTE — Progress Notes (Addendum)
COVID swab appointment: 02/28/21 @ 0830  COVID Vaccine Completed: no Date COVID Vaccine completed: Has received booster: COVID vaccine manufacturer: [facility]   Date of COVID positive in last 90 days: no  PCP - Redmond School, MD Cardiologist -   Chest x-ray - CT 11/16/20 Epic EKG - 09/03/20 Epic Stress Test - n/a ECHO - several years per pt Cardiac Cath - n/a Pacemaker/ICD device last checked: n/a Spinal Cord Stimulator: n/a  Sleep Study - yes, positive CPAP - yes every night. Pt will bring DOS  Fasting Blood Sugar - n/a Checks Blood Sugar _____ times a day  Blood Thinner Instructions: n/a Aspirin Instructions: Last Dose:  Activity level: Can go up a flight of stairs and perform activities of daily living without stopping and without symptoms of chest pain. 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 Slow with stairs due to knee     Anesthesia review: HTN, OSA, SOB (not new)  Patient denies fever, cough and chest pain at PAT appointment. Reports constant SOB   Patient verbalized understanding of instructions that were given to them at the PAT appointment. Patient was also instructed that they will need to review over the PAT instructions again at home before surgery.

## 2021-02-28 ENCOUNTER — Encounter (HOSPITAL_COMMUNITY)
Admission: RE | Admit: 2021-02-28 | Discharge: 2021-02-28 | Disposition: A | Discharge status: Home / Self Care | Payer: Medicare HMO | Source: Ambulatory Visit | Attending: Orthopedic Surgery | Admitting: Orthopedic Surgery

## 2021-02-28 ENCOUNTER — Encounter (HOSPITAL_COMMUNITY): Payer: Self-pay

## 2021-02-28 ENCOUNTER — Other Ambulatory Visit: Payer: Self-pay

## 2021-02-28 VITALS — BP 180/92 | HR 83 | Temp 98.7°F | Resp 20 | Ht 75.0 in | Wt 255.0 lb

## 2021-02-28 DIAGNOSIS — Z01818 Encounter for other preprocedural examination: Secondary | ICD-10-CM

## 2021-02-28 DIAGNOSIS — Z96659 Presence of unspecified artificial knee joint: Secondary | ICD-10-CM | POA: Diagnosis not present

## 2021-02-28 DIAGNOSIS — Z01812 Encounter for preprocedural laboratory examination: Secondary | ICD-10-CM | POA: Insufficient documentation

## 2021-02-28 DIAGNOSIS — T84038D Mechanical loosening of other internal prosthetic joint, subsequent encounter: Secondary | ICD-10-CM | POA: Diagnosis not present

## 2021-02-28 DIAGNOSIS — Z20822 Contact with and (suspected) exposure to covid-19: Secondary | ICD-10-CM | POA: Insufficient documentation

## 2021-02-28 DIAGNOSIS — Y838 Other surgical procedures as the cause of abnormal reaction of the patient, or of later complication, without mention of misadventure at the time of the procedure: Secondary | ICD-10-CM | POA: Diagnosis not present

## 2021-02-28 HISTORY — DX: Headache, unspecified: R51.9

## 2021-02-28 LAB — COMPREHENSIVE METABOLIC PANEL
ALT: 15 U/L (ref 0–44)
AST: 17 U/L (ref 15–41)
Albumin: 3.9 g/dL (ref 3.5–5.0)
Alkaline Phosphatase: 76 U/L (ref 38–126)
Anion gap: 7 (ref 5–15)
BUN: 16 mg/dL (ref 6–20)
CO2: 27 mmol/L (ref 22–32)
Calcium: 8.7 mg/dL — ABNORMAL LOW (ref 8.9–10.3)
Chloride: 103 mmol/L (ref 98–111)
Creatinine, Ser: 0.79 mg/dL (ref 0.61–1.24)
GFR, Estimated: >60 mL/min (ref >60)
Glucose, Bld: 116 mg/dL — ABNORMAL HIGH (ref 70–99)
Potassium: 4 mmol/L (ref 3.5–5.1)
Sodium: 137 mmol/L (ref 135–145)
Total Bilirubin: 0.5 mg/dL (ref 0.3–1.2)
Total Protein: 7.1 g/dL (ref 6.5–8.1)

## 2021-02-28 LAB — CBC
HCT: 36.1 % — ABNORMAL LOW (ref 39.0–52.0)
Hemoglobin: 11 g/dL — ABNORMAL LOW (ref 13.0–17.0)
MCH: 24 pg — ABNORMAL LOW (ref 26.0–34.0)
MCHC: 30.5 g/dL (ref 30.0–36.0)
MCV: 78.8 fL — ABNORMAL LOW (ref 80.0–100.0)
Platelets: 335 10*3/uL (ref 150–400)
RBC: 4.58 MIL/uL (ref 4.22–5.81)
RDW: 14.9 % (ref 11.5–15.5)
WBC: 7.5 10*3/uL (ref 4.0–10.5)
nRBC: 0 % (ref 0.0–0.2)

## 2021-02-28 LAB — SURGICAL PCR SCREEN
MRSA, PCR: NEGATIVE
Staphylococcus aureus: POSITIVE — AB

## 2021-02-28 LAB — SARS CORONAVIRUS 2 (TAT 6-24 HRS): SARS Coronavirus 2: NEGATIVE

## 2021-03-04 ENCOUNTER — Encounter (HOSPITAL_COMMUNITY): Payer: Self-pay | Admitting: Orthopedic Surgery

## 2021-03-04 ENCOUNTER — Inpatient Hospital Stay (HOSPITAL_COMMUNITY): Payer: Medicare HMO | Admitting: Physician Assistant

## 2021-03-04 ENCOUNTER — Inpatient Hospital Stay (HOSPITAL_COMMUNITY)
Admission: RE | Admit: 2021-03-04 | Discharge: 2021-03-07 | DRG: 464 | Disposition: A | Discharge status: Home / Self Care | Payer: Medicare HMO | Source: Ambulatory Visit | Attending: Orthopedic Surgery | Admitting: Orthopedic Surgery

## 2021-03-04 ENCOUNTER — Inpatient Hospital Stay (HOSPITAL_COMMUNITY): Payer: Medicare HMO | Admitting: Certified Registered Nurse Anesthetist

## 2021-03-04 ENCOUNTER — Encounter (HOSPITAL_COMMUNITY)
Admission: RE | Disposition: A | Discharge status: Home / Self Care | Payer: Self-pay | Source: Ambulatory Visit | Attending: Orthopedic Surgery

## 2021-03-04 ENCOUNTER — Other Ambulatory Visit: Payer: Self-pay

## 2021-03-04 DIAGNOSIS — G894 Chronic pain syndrome: Secondary | ICD-10-CM | POA: Diagnosis present

## 2021-03-04 DIAGNOSIS — Z96651 Presence of right artificial knee joint: Secondary | ICD-10-CM | POA: Diagnosis not present

## 2021-03-04 DIAGNOSIS — E669 Obesity, unspecified: Secondary | ICD-10-CM | POA: Diagnosis not present

## 2021-03-04 DIAGNOSIS — T84038D Mechanical loosening of other internal prosthetic joint, subsequent encounter: Principal | ICD-10-CM

## 2021-03-04 DIAGNOSIS — E781 Pure hyperglyceridemia: Secondary | ICD-10-CM | POA: Diagnosis present

## 2021-03-04 DIAGNOSIS — T84032A Mechanical loosening of internal right knee prosthetic joint, initial encounter: Secondary | ICD-10-CM | POA: Diagnosis not present

## 2021-03-04 DIAGNOSIS — M25561 Pain in right knee: Secondary | ICD-10-CM | POA: Diagnosis present

## 2021-03-04 DIAGNOSIS — Z808 Family history of malignant neoplasm of other organs or systems: Secondary | ICD-10-CM

## 2021-03-04 DIAGNOSIS — Y792 Prosthetic and other implants, materials and accessory orthopedic devices associated with adverse incidents: Secondary | ICD-10-CM | POA: Diagnosis present

## 2021-03-04 DIAGNOSIS — G8918 Other acute postprocedural pain: Secondary | ICD-10-CM | POA: Diagnosis not present

## 2021-03-04 DIAGNOSIS — G4730 Sleep apnea, unspecified: Secondary | ICD-10-CM | POA: Diagnosis not present

## 2021-03-04 DIAGNOSIS — D649 Anemia, unspecified: Secondary | ICD-10-CM | POA: Diagnosis not present

## 2021-03-04 DIAGNOSIS — R262 Difficulty in walking, not elsewhere classified: Secondary | ICD-10-CM | POA: Diagnosis present

## 2021-03-04 DIAGNOSIS — D62 Acute posthemorrhagic anemia: Secondary | ICD-10-CM | POA: Diagnosis not present

## 2021-03-04 DIAGNOSIS — Z96652 Presence of left artificial knee joint: Secondary | ICD-10-CM | POA: Diagnosis present

## 2021-03-04 DIAGNOSIS — Z20822 Contact with and (suspected) exposure to covid-19: Secondary | ICD-10-CM | POA: Diagnosis present

## 2021-03-04 DIAGNOSIS — Z96659 Presence of unspecified artificial knee joint: Secondary | ICD-10-CM

## 2021-03-04 DIAGNOSIS — Z8546 Personal history of malignant neoplasm of prostate: Secondary | ICD-10-CM | POA: Diagnosis not present

## 2021-03-04 DIAGNOSIS — Z8249 Family history of ischemic heart disease and other diseases of the circulatory system: Secondary | ICD-10-CM | POA: Diagnosis not present

## 2021-03-04 DIAGNOSIS — I10 Essential (primary) hypertension: Secondary | ICD-10-CM | POA: Diagnosis not present

## 2021-03-04 DIAGNOSIS — Z6831 Body mass index (BMI) 31.0-31.9, adult: Secondary | ICD-10-CM | POA: Diagnosis not present

## 2021-03-04 HISTORY — PX: TOTAL KNEE REVISION: SHX996

## 2021-03-04 SURGERY — TOTAL KNEE REVISION
Anesthesia: Spinal | Site: Knee | Laterality: Right

## 2021-03-04 MED ORDER — ONDANSETRON HCL 4 MG/2ML IJ SOLN
INTRAMUSCULAR | Status: AC
  Filled 2021-03-04: qty 2

## 2021-03-04 MED ORDER — METHOCARBAMOL 500 MG PO TABS
500.0000 mg | ORAL_TABLET | Freq: Four times a day (QID) | ORAL | Status: DC | PRN
  Administered 2021-03-04 – 2021-03-05 (×5): 500 mg via ORAL
  Filled 2021-03-04 (×5): qty 1

## 2021-03-04 MED ORDER — STERILE WATER FOR IRRIGATION IR SOLN
Status: DC | PRN
  Administered 2021-03-04: 2000 mL

## 2021-03-04 MED ORDER — OXYCODONE HCL 5 MG PO TABS
5.0000 mg | ORAL_TABLET | Freq: Once | ORAL | Status: DC | PRN
Start: 2021-03-04 — End: 2021-03-04

## 2021-03-04 MED ORDER — MENTHOL 3 MG MT LOZG: 1.0000 | LOZENGE | OROMUCOSAL | Status: DC | PRN

## 2021-03-04 MED ORDER — OXYCODONE HCL 5 MG/5ML PO SOLN: 5.0000 mg | Freq: Once | ORAL | Status: DC | PRN

## 2021-03-04 MED ORDER — KETOROLAC TROMETHAMINE 15 MG/ML IJ SOLN
15.0000 mg | Freq: Four times a day (QID) | INTRAMUSCULAR | Status: AC
  Administered 2021-03-04 – 2021-03-05 (×4): 15 mg via INTRAVENOUS
  Filled 2021-03-04 (×4): qty 1

## 2021-03-04 MED ORDER — SODIUM CHLORIDE (PF) 0.9 % IJ SOLN
INTRAMUSCULAR | Status: DC | PRN
  Administered 2021-03-04: 30 mL

## 2021-03-04 MED ORDER — BUPIVACAINE IN DEXTROSE 0.75-8.25 % IT SOLN
INTRATHECAL | Status: DC | PRN
  Administered 2021-03-04: 2 mL via INTRATHECAL

## 2021-03-04 MED ORDER — KETOROLAC TROMETHAMINE 30 MG/ML IJ SOLN
INTRAMUSCULAR | Status: DC | PRN
  Administered 2021-03-04: 30 mg

## 2021-03-04 MED ORDER — SODIUM CHLORIDE (PF) 0.9 % IJ SOLN
INTRAMUSCULAR | Status: AC
  Filled 2021-03-04: qty 30

## 2021-03-04 MED ORDER — DEXAMETHASONE SODIUM PHOSPHATE 10 MG/ML IJ SOLN
8.0000 mg | Freq: Once | INTRAMUSCULAR | Status: AC
  Administered 2021-03-04: 8 mg via INTRAVENOUS

## 2021-03-04 MED ORDER — PHENOL 1.4 % MT LIQD: 1.0000 | OROMUCOSAL | Status: DC | PRN

## 2021-03-04 MED ORDER — VANCOMYCIN HCL 1000 MG IV SOLR
INTRAVENOUS | Status: DC | PRN
  Administered 2021-03-04: 1000 mg via TOPICAL

## 2021-03-04 MED ORDER — ACETAMINOPHEN 325 MG PO TABS
325.0000 mg | ORAL_TABLET | Freq: Four times a day (QID) | ORAL | Status: DC | PRN
  Administered 2021-03-05 – 2021-03-07 (×4): 650 mg via ORAL
  Filled 2021-03-04 (×4): qty 2

## 2021-03-04 MED ORDER — FENTANYL CITRATE PF 50 MCG/ML IJ SOSY
50.0000 ug | PREFILLED_SYRINGE | INTRAMUSCULAR | Status: AC
  Administered 2021-03-04: 50 ug via INTRAVENOUS
  Filled 2021-03-04: qty 2

## 2021-03-04 MED ORDER — ONDANSETRON HCL 4 MG/2ML IJ SOLN: 4.0000 mg | Freq: Four times a day (QID) | INTRAMUSCULAR | Status: DC | PRN

## 2021-03-04 MED ORDER — FERROUS SULFATE 325 (65 FE) MG PO TABS
325.0000 mg | ORAL_TABLET | Freq: Three times a day (TID) | ORAL | Status: DC
  Administered 2021-03-05 – 2021-03-07 (×7): 325 mg via ORAL
  Filled 2021-03-04 (×7): qty 1

## 2021-03-04 MED ORDER — LACTATED RINGERS IV SOLN
INTRAVENOUS | Status: DC
Start: 2021-03-04 — End: 2021-03-04

## 2021-03-04 MED ORDER — SODIUM CHLORIDE 0.9 % IV SOLN: INTRAVENOUS | Status: DC

## 2021-03-04 MED ORDER — VANCOMYCIN HCL 1000 MG IV SOLR
INTRAVENOUS | Status: AC
  Filled 2021-03-04: qty 20

## 2021-03-04 MED ORDER — ORAL CARE MOUTH RINSE: 15.0000 mL | Freq: Once | OROMUCOSAL | Status: AC

## 2021-03-04 MED ORDER — BISACODYL 10 MG RE SUPP: 10.0000 mg | Freq: Every day | RECTAL | Status: DC | PRN

## 2021-03-04 MED ORDER — POVIDONE-IODINE 10 % EX SWAB
2.0000 "application " | Freq: Once | CUTANEOUS | Status: AC
  Administered 2021-03-04: 2 via TOPICAL

## 2021-03-04 MED ORDER — PROMETHAZINE HCL 25 MG/ML IJ SOLN: 6.2500 mg | INTRAMUSCULAR | Status: DC | PRN

## 2021-03-04 MED ORDER — PROPOFOL 1000 MG/100ML IV EMUL
INTRAVENOUS | Status: AC
  Filled 2021-03-04: qty 100

## 2021-03-04 MED ORDER — 0.9 % SODIUM CHLORIDE (POUR BTL) OPTIME
TOPICAL | Status: DC | PRN
  Administered 2021-03-04: 1000 mL

## 2021-03-04 MED ORDER — ACETAMINOPHEN 10 MG/ML IV SOLN: 1000.0000 mg | Freq: Once | INTRAVENOUS | Status: DC | PRN

## 2021-03-04 MED ORDER — DOCUSATE SODIUM 100 MG PO CAPS
100.0000 mg | ORAL_CAPSULE | Freq: Two times a day (BID) | ORAL | Status: DC
  Administered 2021-03-04 – 2021-03-07 (×5): 100 mg via ORAL
  Filled 2021-03-04 (×6): qty 1

## 2021-03-04 MED ORDER — PROPOFOL 10 MG/ML IV BOLUS
INTRAVENOUS | Status: DC | PRN
  Administered 2021-03-04: 20 mg via INTRAVENOUS
  Administered 2021-03-04: 10 mg via INTRAVENOUS
  Administered 2021-03-04: 20 mg via INTRAVENOUS

## 2021-03-04 MED ORDER — METOCLOPRAMIDE HCL 5 MG/ML IJ SOLN: 5.0000 mg | Freq: Three times a day (TID) | INTRAMUSCULAR | Status: DC | PRN

## 2021-03-04 MED ORDER — LACTATED RINGERS IV SOLN: INTRAVENOUS | Status: DC

## 2021-03-04 MED ORDER — BUPIVACAINE-EPINEPHRINE (PF) 0.25% -1:200000 IJ SOLN
INTRAMUSCULAR | Status: AC
  Filled 2021-03-04: qty 30

## 2021-03-04 MED ORDER — ONDANSETRON HCL 4 MG PO TABS: 4.0000 mg | ORAL_TABLET | Freq: Four times a day (QID) | ORAL | Status: DC | PRN

## 2021-03-04 MED ORDER — MIDAZOLAM HCL 2 MG/2ML IJ SOLN
1.0000 mg | INTRAMUSCULAR | Status: AC
  Administered 2021-03-04: 1 mg via INTRAVENOUS
  Filled 2021-03-04: qty 2

## 2021-03-04 MED ORDER — CEFAZOLIN SODIUM-DEXTROSE 2-4 GM/100ML-% IV SOLN
2.0000 g | INTRAVENOUS | Status: AC
  Administered 2021-03-04: 2 g via INTRAVENOUS
  Filled 2021-03-04: qty 100

## 2021-03-04 MED ORDER — PROPOFOL 500 MG/50ML IV EMUL
INTRAVENOUS | Status: DC | PRN
  Administered 2021-03-04: 125 ug/kg/min via INTRAVENOUS

## 2021-03-04 MED ORDER — OXYCODONE HCL 5 MG PO TABS
10.0000 mg | ORAL_TABLET | ORAL | Status: DC | PRN
  Administered 2021-03-07: 10 mg via ORAL
  Filled 2021-03-04 (×3): qty 2

## 2021-03-04 MED ORDER — KETOROLAC TROMETHAMINE 30 MG/ML IJ SOLN
INTRAMUSCULAR | Status: AC
  Filled 2021-03-04: qty 1

## 2021-03-04 MED ORDER — HYDROMORPHONE HCL 1 MG/ML IJ SOLN: 0.2500 mg | INTRAMUSCULAR | Status: DC | PRN

## 2021-03-04 MED ORDER — ASPIRIN 81 MG PO CHEW
81.0000 mg | CHEWABLE_TABLET | Freq: Two times a day (BID) | ORAL | Status: DC
  Administered 2021-03-04 – 2021-03-07 (×6): 81 mg via ORAL
  Filled 2021-03-04 (×6): qty 1

## 2021-03-04 MED ORDER — POLYETHYLENE GLYCOL 3350 17 G PO PACK: 17.0000 g | PACK | Freq: Every day | ORAL | Status: DC | PRN

## 2021-03-04 MED ORDER — SODIUM CHLORIDE 0.9 % IR SOLN
Status: DC | PRN
  Administered 2021-03-04: 1000 mL

## 2021-03-04 MED ORDER — CHLORHEXIDINE GLUCONATE 0.12 % MT SOLN
15.0000 mL | Freq: Once | OROMUCOSAL | Status: AC
  Administered 2021-03-04: 15 mL via OROMUCOSAL

## 2021-03-04 MED ORDER — METHOCARBAMOL 500 MG IVPB - SIMPLE MED
500.0000 mg | Freq: Four times a day (QID) | INTRAVENOUS | Status: DC | PRN
  Filled 2021-03-04: qty 50

## 2021-03-04 MED ORDER — TRANEXAMIC ACID-NACL 1000-0.7 MG/100ML-% IV SOLN
1000.0000 mg | INTRAVENOUS | Status: AC
  Administered 2021-03-04: 1000 mg via INTRAVENOUS
  Filled 2021-03-04: qty 100

## 2021-03-04 MED ORDER — ROPIVACAINE HCL 5 MG/ML IJ SOLN
INTRAMUSCULAR | Status: DC | PRN
  Administered 2021-03-04: 30 mL via PERINEURAL

## 2021-03-04 MED ORDER — DIPHENHYDRAMINE HCL 12.5 MG/5ML PO ELIX
12.5000 mg | ORAL_SOLUTION | ORAL | Status: DC | PRN
  Administered 2021-03-06 – 2021-03-07 (×3): 25 mg via ORAL
  Filled 2021-03-04 (×4): qty 10

## 2021-03-04 MED ORDER — CEFAZOLIN SODIUM-DEXTROSE 2-4 GM/100ML-% IV SOLN
2.0000 g | Freq: Four times a day (QID) | INTRAVENOUS | Status: AC
  Administered 2021-03-04 – 2021-03-05 (×2): 2 g via INTRAVENOUS
  Filled 2021-03-04 (×2): qty 100

## 2021-03-04 MED ORDER — LOSARTAN POTASSIUM 50 MG PO TABS
50.0000 mg | ORAL_TABLET | Freq: Every day | ORAL | Status: DC
Start: 2021-03-05 — End: 2021-03-07
  Administered 2021-03-05 – 2021-03-07 (×3): 50 mg via ORAL
  Filled 2021-03-04 (×3): qty 1

## 2021-03-04 MED ORDER — DEXAMETHASONE SODIUM PHOSPHATE 10 MG/ML IJ SOLN
10.0000 mg | Freq: Once | INTRAMUSCULAR | Status: AC
  Administered 2021-03-05: 11:00:00 10 mg via INTRAVENOUS
  Filled 2021-03-04: qty 1

## 2021-03-04 MED ORDER — TRANEXAMIC ACID-NACL 1000-0.7 MG/100ML-% IV SOLN
1000.0000 mg | Freq: Once | INTRAVENOUS | Status: AC
  Administered 2021-03-04: 1000 mg via INTRAVENOUS
  Filled 2021-03-04: qty 100

## 2021-03-04 MED ORDER — METOCLOPRAMIDE HCL 5 MG PO TABS: 5.0000 mg | ORAL_TABLET | Freq: Three times a day (TID) | ORAL | Status: DC | PRN

## 2021-03-04 MED ORDER — HYDROMORPHONE HCL 1 MG/ML IJ SOLN
0.5000 mg | INTRAMUSCULAR | Status: DC | PRN
  Administered 2021-03-04 – 2021-03-06 (×8): 1 mg via INTRAVENOUS
  Filled 2021-03-04 (×8): qty 1

## 2021-03-04 MED ORDER — ONDANSETRON HCL 4 MG/2ML IJ SOLN
INTRAMUSCULAR | Status: DC | PRN
Start: 2021-03-04 — End: 2021-03-04
  Administered 2021-03-04: 4 mg via INTRAVENOUS

## 2021-03-04 MED ORDER — BUPIVACAINE-EPINEPHRINE 0.25% -1:200000 IJ SOLN
INTRAMUSCULAR | Status: DC | PRN
  Administered 2021-03-04: 30 mL

## 2021-03-04 MED ORDER — OXYCODONE HCL 5 MG PO TABS
15.0000 mg | ORAL_TABLET | ORAL | Status: DC | PRN
  Administered 2021-03-04 – 2021-03-07 (×12): 20 mg via ORAL
  Filled 2021-03-04 (×11): qty 4

## 2021-03-04 SURGICAL SUPPLY — 74 items
ADH SKN CLS APL DERMABOND .7 (GAUZE/BANDAGES/DRESSINGS) ×1
ATTUNE PSRP INSR SZ6 7 KNEE (Insert) ×1 IMPLANT
ATUNE TIB SLV M/L 53 (Orthopedic Implant) ×2 IMPLANT
BAG COUNTER SPONGE SURGICOUNT (BAG) IMPLANT
BAG DECANTER FOR FLEXI CONT (MISCELLANEOUS) IMPLANT
BAG SPEC THK2 15X12 ZIP CLS (MISCELLANEOUS)
BAG SPNG CNTER NS LX DISP (BAG)
BAG ZIPLOCK 12X15 (MISCELLANEOUS) IMPLANT
BLADE SAW SGTL 11.0X1.19X90.0M (BLADE) IMPLANT
BLADE SAW SGTL 13.0X1.19X90.0M (BLADE) ×3 IMPLANT
BLADE SAW SGTL 81X20 HD (BLADE) ×3 IMPLANT
BLADE SURG SZ10 CARB STEEL (BLADE) ×6 IMPLANT
BNDG CMPR MED 10X6 ELC LF (GAUZE/BANDAGES/DRESSINGS) ×1
BNDG ELASTIC 6X10 VLCR STRL LF (GAUZE/BANDAGES/DRESSINGS) ×1 IMPLANT
BNDG ELASTIC 6X5.8 VLCR STR LF (GAUZE/BANDAGES/DRESSINGS) ×3 IMPLANT
BRUSH FEMORAL CANAL (MISCELLANEOUS) ×1 IMPLANT
BSPLAT TIB 4 CMNT REV ROT PLAT (Knees) ×1 IMPLANT
CEMENT HV SMART SET (Cement) ×3 IMPLANT
COVER SURGICAL LIGHT HANDLE (MISCELLANEOUS) ×3 IMPLANT
CUFF TOURN SGL QUICK 34 (TOURNIQUET CUFF) ×2
CUFF TRNQT CYL 34X4.125X (TOURNIQUET CUFF) ×2 IMPLANT
DECANTER SPIKE VIAL GLASS SM (MISCELLANEOUS) IMPLANT
DERMABOND ADVANCED (GAUZE/BANDAGES/DRESSINGS) ×1
DERMABOND ADVANCED .7 DNX12 (GAUZE/BANDAGES/DRESSINGS) ×2 IMPLANT
DRAPE INCISE IOBAN 66X45 STRL (DRAPES) ×3 IMPLANT
DRAPE U-SHAPE 47X51 STRL (DRAPES) ×3 IMPLANT
DRESSING AQUACEL AG SP 3.5X10 (GAUZE/BANDAGES/DRESSINGS) IMPLANT
DRSG AQUACEL AG ADV 3.5X10 (GAUZE/BANDAGES/DRESSINGS) ×3 IMPLANT
DRSG AQUACEL AG ADV 3.5X14 (GAUZE/BANDAGES/DRESSINGS) ×1 IMPLANT
DRSG AQUACEL AG SP 3.5X10 (GAUZE/BANDAGES/DRESSINGS)
DRSG PAD ABDOMINAL 8X10 ST (GAUZE/BANDAGES/DRESSINGS) ×2 IMPLANT
DURAPREP 26ML APPLICATOR (WOUND CARE) ×6 IMPLANT
ELECT REM PT RETURN 15FT ADLT (MISCELLANEOUS) ×3 IMPLANT
FEM KNEE ATTUNE REV CRS SZ6 RT (Orthopedic Implant) ×2 IMPLANT
FEMORAL KNE ATTN REV CRS SZ6RT (Orthopedic Implant) IMPLANT
GAUZE SPONGE 2X2 8PLY STRL LF (GAUZE/BANDAGES/DRESSINGS) IMPLANT
GLOVE SURG UNDER POLY LF SZ7.5 (GLOVE) ×6 IMPLANT
GOWN STRL REUS W/TWL LRG LVL3 (GOWN DISPOSABLE) ×3 IMPLANT
HANDPIECE INTERPULSE COAX TIP (DISPOSABLE) ×2
HOLDER FOLEY CATH W/STRAP (MISCELLANEOUS) IMPLANT
IMMOBILIZER KNEE 22 UNIV (SOFTGOODS) ×1 IMPLANT
INSERT TIB CMT ATTUNE RP SZ4 (Knees) ×1 IMPLANT
IRRIGATION SURGIPHOR STRL (IV SOLUTION) IMPLANT
KIT TURNOVER KIT A (KITS) IMPLANT
MANIFOLD NEPTUNE II (INSTRUMENTS) ×3 IMPLANT
NDL SAFETY ECLIPSE 18X1.5 (NEEDLE) IMPLANT
NEEDLE HYPO 18GX1.5 SHARP (NEEDLE)
NS IRRIG 1000ML POUR BTL (IV SOLUTION) ×3 IMPLANT
PACK TOTAL KNEE CUSTOM (KITS) ×3 IMPLANT
PAD CAST 4YDX4 CTTN HI CHSV (CAST SUPPLIES) IMPLANT
PADDING CAST COTTON 4X4 STRL (CAST SUPPLIES) ×2
PROTECTOR NERVE ULNAR (MISCELLANEOUS) ×3 IMPLANT
SET HNDPC FAN SPRY TIP SCT (DISPOSABLE) ×2 IMPLANT
SET PAD KNEE POSITIONER (MISCELLANEOUS) ×3 IMPLANT
SLEEVE ATTUNE TIB M/L 53 (Orthopedic Implant) IMPLANT
SLEEVE FEM ATTUNE FP 40 (Knees) ×1 IMPLANT
SOLUTION PRONTOSAN WOUND 350ML (IRRIGATION / IRRIGATOR) ×1 IMPLANT
SPONGE GAUZE 2X2 STER 10/PKG (GAUZE/BANDAGES/DRESSINGS)
SPONGE T-LAP 18X18 ~~LOC~~+RFID (SPONGE) ×9 IMPLANT
STAPLER VISISTAT 35W (STAPLE) IMPLANT
STEM STR ATTUNE PF 20X60 (Knees) ×1 IMPLANT
STEM STR ATTUNE PF 22X60 (Knees) ×1 IMPLANT
SUT MNCRL AB 3-0 PS2 18 (SUTURE) ×3 IMPLANT
SUT STRATAFIX PDS+ 0 24IN (SUTURE) ×3 IMPLANT
SUT VIC AB 1 CT1 36 (SUTURE) ×3 IMPLANT
SUT VIC AB 2-0 CT1 27 (SUTURE) ×8
SUT VIC AB 2-0 CT1 TAPERPNT 27 (SUTURE) ×6 IMPLANT
SYR 50ML LL SCALE MARK (SYRINGE) IMPLANT
TOWER CARTRIDGE SMART MIX (DISPOSABLE) ×3 IMPLANT
TRAY FOLEY MTR SLVR 16FR STAT (SET/KITS/TRAYS/PACK) ×3 IMPLANT
TUBE KAMVAC SUCTION (TUBING) IMPLANT
TUBE SUCTION HIGH CAP CLEAR NV (SUCTIONS) ×3 IMPLANT
WATER STERILE IRR 1000ML POUR (IV SOLUTION) ×3 IMPLANT
WRAP KNEE MAXI GEL POST OP (GAUZE/BANDAGES/DRESSINGS) ×3 IMPLANT

## 2021-03-04 NOTE — Anesthesia Procedure Notes (Addendum)
Anesthesia Regional Block: Adductor canal block   Pre-Anesthetic Checklist: , timeout performed,  Correct Patient, Correct Site, Correct Laterality,  Correct Procedure, Correct Position, site marked,  Risks and benefits discussed,  Surgical consent,  Pre-op evaluation,  At surgeon's request and post-op pain management  Laterality: Right  Prep: chloraprep       Needles:  Injection technique: Single-shot  Needle Type: Echogenic Needle     Needle Length: 9cm      Additional Needles:   Procedures:,,,, ultrasound used (permanent image in chart),,    Narrative:  Start time: 03/04/2021 12:50 PM End time: 03/04/2021 12:56 PM Injection made incrementally with aspirations every 5 mL.  Performed by: Personally  Anesthesiologist: Myrtie Soman, MD  Additional Notes: Patient tolerated the procedure well without complications

## 2021-03-04 NOTE — Discharge Instructions (Signed)

## 2021-03-04 NOTE — Anesthesia Procedure Notes (Signed)
Procedure Name: MAC Date/Time: 03/04/2021 1:53 PM Performed by: Maxwell Caul, CRNA Pre-anesthesia Checklist: Patient identified, Emergency Drugs available, Patient being monitored and Suction available Oxygen Delivery Method: Simple face mask

## 2021-03-04 NOTE — Transfer of Care (Signed)
Immediate Anesthesia Transfer of Care Note  Patient: Andrew Haley  Procedure(s) Performed: TOTAL KNEE REVISION (Right: Knee)  Patient Location: PACU  Anesthesia Type:Spinal  Level of Consciousness: awake and alert   Airway & Oxygen Therapy: Patient Spontanous Breathing and Patient connected to face mask oxygen  Post-op Assessment: Report given to RN and Post -op Vital signs reviewed and stable  Post vital signs: Reviewed and stable  Last Vitals:  Vitals Value Taken Time  BP 163/103 03/04/21 1700  Temp    Pulse 73 03/04/21 1701  Resp 15 03/04/21 1701  SpO2 100 % 03/04/21 1701  Vitals shown include unvalidated device data.  Last Pain:  Vitals:   03/04/21 1222  TempSrc: Oral  PainSc:       Patients Stated Pain Goal: 8 (40/34/74 2595)  Complications: No notable events documented.

## 2021-03-04 NOTE — Progress Notes (Signed)
Pt refused CPAP qhs.  Pt encouraged to contact RT should he change his mind.   

## 2021-03-04 NOTE — Anesthesia Preprocedure Evaluation (Signed)
Anesthesia Evaluation  Patient identified by MRN, date of birth, ID band Patient awake    Reviewed: Allergy & Precautions, NPO status , Patient's Chart, lab work & pertinent test results  Airway Mallampati: III  TM Distance: <3 FB Neck ROM: Full    Dental no notable dental hx.    Pulmonary sleep apnea and Continuous Positive Airway Pressure Ventilation ,    Pulmonary exam normal breath sounds clear to auscultation       Cardiovascular hypertension, Pt. on medications Normal cardiovascular exam Rhythm:Regular Rate:Normal     Neuro/Psych negative neurological ROS  negative psych ROS   GI/Hepatic negative GI ROS, (+)     substance abuse  cocaine use,   Endo/Other  negative endocrine ROS  Renal/GU negative Renal ROS  negative genitourinary   Musculoskeletal  (+) Arthritis , Osteoarthritis,    Abdominal   Peds negative pediatric ROS (+)  Hematology  (+) anemia ,   Anesthesia Other Findings   Reproductive/Obstetrics negative OB ROS                             Anesthesia Physical Anesthesia Plan  ASA: 3  Anesthesia Plan: Spinal   Post-op Pain Management: Regional block   Induction: Intravenous  PONV Risk Score and Plan: 2 and Ondansetron, Propofol infusion and Treatment may vary due to age or medical condition  Airway Management Planned: Simple Face Mask  Additional Equipment:   Intra-op Plan:   Post-operative Plan:   Informed Consent: I have reviewed the patients History and Physical, chart, labs and discussed the procedure including the risks, benefits and alternatives for the proposed anesthesia with the patient or authorized representative who has indicated his/her understanding and acceptance.     Dental advisory given  Plan Discussed with: CRNA and Surgeon  Anesthesia Plan Comments:         Anesthesia Quick Evaluation

## 2021-03-04 NOTE — Anesthesia Procedure Notes (Signed)
Spinal  Patient location during procedure: OR Start time: 03/04/2021 1:58 PM End time: 03/04/2021 2:03 PM Reason for block: surgical anesthesia Staffing Performed: anesthesiologist  Anesthesiologist: Myrtie Soman, MD Preanesthetic Checklist Completed: patient identified, IV checked, site marked, risks and benefits discussed, surgical consent, monitors and equipment checked, pre-op evaluation and timeout performed Spinal Block Patient position: sitting Prep: Betadine Patient monitoring: heart rate, continuous pulse ox and blood pressure Approach: midline Location: L3-4 Injection technique: single-shot Needle Needle type: Sprotte  Needle gauge: 24 G Needle length: 9 cm Assessment Sensory level: T6 Events: CSF return and second provider Additional Notes

## 2021-03-04 NOTE — Progress Notes (Signed)
Assisted Dr. Rose with right, ultrasound guided, adductor canal block. Side rails up, monitors on throughout procedure. See vital signs in flow sheet. Tolerated Procedure well.  

## 2021-03-04 NOTE — Anesthesia Procedure Notes (Signed)
Anesthesia Procedure Image    

## 2021-03-04 NOTE — H&P (Signed)
TOTAL KNEE REVISION ADMISSION H&P  Patient is being admitted for right revision total knee arthroplasty.  Subjective:  Chief Complaint:right knee pain.  HPI: Andrew Haley, 56 y.o. male, has a history of pain and functional disability in the right knee(s) due to failed previous arthroplasty and patient has failed non-surgical conservative treatments for greater than 12 weeks to include activity modification. The indications for the revision of the total knee arthroplasty are loosening of one or more components and fracture or mechanical failure of one or components. He has a history of primary total knee arthroplasty in 2016 by Dr. Gladstone Lighter. There is no current active infection.  Patient Active Problem List   Diagnosis Date Noted   Medication monitoring encounter 09/10/2020   PICC (peripherally inserted central catheter) removal 09/10/2020   Septic arthritis ([place]) 07/23/2020   Chronic pain syndrome    Cellulitis of left lower extremity 07/18/2020   Normocytic anemia 07/18/2020   Hypocalcemia 07/18/2020   Essential hypertension 07/18/2020   Class 1 obesity 07/18/2020   Encounter for screening colonoscopy 07/30/2017   Prostate cancer ([place]) 11/29/2015   Postoperative anemia due to acute blood loss 05/25/2014   History of total knee arthroplasty 05/23/2014   H/O total knee replacement 02/22/2014   Past Medical History:  Diagnosis Date   Arthritis    Class 1 obesity 30/16/0109   Complication of anesthesia    slow to wake up   Essential hypertension 07/18/2020   Headache    Hypertension    Hypertriglyceridemia    Numbness of fingers of both hands    Prostate cancer ([place]) 2017   Sleep apnea    CPAP    Past Surgical History:  Procedure Laterality Date   ANKLE SURGERY Left    COLONOSCOPY WITH PROPOFOL N/A 09/28/2017   Procedure: COLONOSCOPY WITH PROPOFOL;  Surgeon: Daneil Dolin, MD;  [facility];  Service: Endoscopy;  Laterality: N/A;  12:00pm   HAND SURGERY Right  2003   cysts    JOINT REPLACEMENT     LESION REMOVAL Left 09/23/2013   Procedure: MINOR EXCISION 3 CM SKIN LESION OF LEFT THIGH ;  Surgeon: Jamesetta So, MD;  Location: [facility];  Service: General;  Laterality: Left;   LYMPHADENECTOMY Bilateral 11/29/2015   Procedure: LYMPHADENECTOMY;  Surgeon: Raynelle Bring, MD;  Location: [facility];  Service: Urology;  Laterality: Bilateral;   ROBOT ASSISTED LAPAROSCOPIC RADICAL PROSTATECTOMY N/A 11/29/2015   Procedure: XI ROBOTIC ASSISTED LAPAROSCOPIC RADICAL PROSTATECTOMY LEVEL 3;  Surgeon: Raynelle Bring, MD;  Location: [facility];  Service: Urology;  Laterality: N/A;   TOTAL KNEE ARTHROPLASTY Left 02/22/2014   Procedure: LEFT TOTAL KNEE ARTHROPLASTY;  Surgeon: Tobi Bastos, MD;  Location: [facility];  Service: Orthopedics;  Laterality: Left;   TOTAL KNEE ARTHROPLASTY Right 05/23/2014   Procedure: RIGHT TOTAL KNEE ARTHROPLASTY;  Surgeon: Latanya Maudlin, MD;  Location: [facility];  Service: Orthopedics;  Laterality: Right;    Current Facility-Administered Medications  Medication Dose Route Frequency Provider Last Rate Last Admin   ceFAZolin (ANCEF) IVPB 2g/100 mL premix  2 g Intravenous On Call to OR Irving Copas, PA-C       dexamethasone (DECADRON) injection 8 mg  8 mg Intravenous Once Irving Copas, PA-C       fentaNYL (SUBLIMAZE) injection 50-100 mcg  50-100 mcg Intravenous Pollyann Glen, MD       lactated ringers infusion   Intravenous Continuous Irving Copas, PA-C       lactated  ringers infusion   Intravenous Continuous Barnet Glasgow, MD       midazolam (VERSED) injection 1-2 mg  1-2 mg Intravenous Pollyann Glen, MD       tranexamic acid (CYKLOKAPRON) IVPB 1,000 mg  1,000 mg Intravenous To OR Irving Copas, PA-C       No Known Allergies  Social History   Tobacco Use   Smoking status: Never   Smokeless tobacco: Never  Substance Use Topics   Alcohol use: Not Currently    Comment: couple beers daily    Family History  Problem  Relation Age of Onset   Heart disease Mother    Throat cancer Father    Colon cancer Neg Hx    Colon polyps Neg Hx       Review of Systems  Constitutional:  Negative for chills and fever.  Respiratory:  Negative for cough and shortness of breath.   Cardiovascular:  Negative for chest pain.  Gastrointestinal:  Negative for nausea and vomiting.  Musculoskeletal:  Positive for arthralgias.     Objective:  Physical Exam BMI noted to be 33.4 Pleasant 56 year old male awake alert and oriented. He is in no acute distress. He walks with a significant gait abnormality with significant genu varum a limp and the use of a crutch.  Right knee exam: His incision is healed without erythema warmth or effusion Significant genu varum Strength not assessed today No significant lower extremity edema or erythema  Vital signs in last 24 hours: Weight:  [115.7 kg] 115.7 kg (01/23 1149)  Labs:  Estimated body mass index is 31.88 kg/m as calculated from the following:   Height as of this encounter: 6\' 3"  (1.905 m).   Weight as of this encounter: 115.7 kg.  Imaging Review Imaging: Radiographs from January 2023 were reviewed. These reveal significant varus collapse and failure of his tibial component. This represents a significant change from his last radiographic assessment.    Assessment/Plan:  Tibial loosening, right knee(s) with failed previous arthroplasty.   The patient history, physical examination, clinical judgment of the provider and imaging studies are consistent with loosening of the right knee(s), previous total knee arthroplasty. Revision total knee arthroplasty is deemed medically necessary. The treatment options including medical management, injection therapy, arthroscopy and revision arthroplasty were discussed at length. The risks and benefits of revision total knee arthroplasty were presented and reviewed. The risks due to aseptic loosening, infection, stiffness, patella  tracking problems, thromboembolic complications and other imponderables were discussed. The patient acknowledged the explanation, agreed to proceed with the plan and consent was signed. Patient is being admitted for inpatient treatment for surgery, pain control, PT, OT, prophylactic antibiotics, VTE prophylaxis, progressive ambulation and ADL's and discharge planning.The patient is planning to be discharged  home.  On Oxycodone 10 mg q4h per PCP at baseline OPPT set up at [place] in [facility], [facility] [phone]

## 2021-03-04 NOTE — Interval H&P Note (Signed)
History and Physical Interval Note:  03/04/2021 1:29 PM  Andrew Haley  has presented today for surgery, with the diagnosis of Failed Right total knee arthroplasty.  The various methods of treatment have been discussed with the patient and family. After consideration of risks, benefits and other options for treatment, the patient has consented to  Procedure(s): TOTAL KNEE REVISION (Right) as a surgical intervention.  The patient's history has been reviewed, patient examined, no change in status, stable for surgery.  I have reviewed the patient's chart and labs.  Questions were answered to the patient's satisfaction.     [name]

## 2021-03-04 NOTE — Brief Op Note (Signed)
03/04/2021  5:04 PM  PATIENT:  Andrew Haley  56 y.o. male  PRE-OPERATIVE DIAGNOSIS:  Failed Right total knee arthroplasty  POST-OPERATIVE DIAGNOSIS:  Failed Right total knee arthroplasty  PROCEDURE:  Procedure(s): TOTAL KNEE REVISION (Right)  SURGEON:  Surgeon(s) and Role:    Paralee Cancel, MD - Primary  PHYSICIAN ASSISTANT: Costella Hatcher, PA-C  ANESTHESIA:   regional and spinal  EBL:  700 mL   BLOOD ADMINISTERED:none  DRAINS: none   LOCAL MEDICATIONS USED:  MARCAINE injected and 1 gm Vancomycin powder  SPECIMEN:  No Specimen  DISPOSITION OF SPECIMEN:  N/A  COUNTS:  YES  TOURNIQUET:   Total Tourniquet Time Documented: Thigh (Right) - 88 minutes Total: Thigh (Right) - 88 minutes   DICTATION: .Other Dictation: Dictation Number [id number]  PLAN OF CARE: Admit to inpatient   PATIENT DISPOSITION:  PACU - hemodynamically stable.   Delay start of Pharmacological VTE agent (>24hrs) due to surgical blood loss or risk of bleeding: no

## 2021-03-05 ENCOUNTER — Encounter (HOSPITAL_COMMUNITY): Payer: Self-pay | Admitting: Orthopedic Surgery

## 2021-03-05 LAB — CBC
HCT: 26 % — ABNORMAL LOW (ref 39.0–52.0)
Hemoglobin: 7.8 g/dL — ABNORMAL LOW (ref 13.0–17.0)
MCH: 24.1 pg — ABNORMAL LOW (ref 26.0–34.0)
MCHC: 30 g/dL (ref 30.0–36.0)
MCV: 80.5 fL (ref 80.0–100.0)
Platelets: 244 10*3/uL (ref 150–400)
RBC: 3.23 MIL/uL — ABNORMAL LOW (ref 4.22–5.81)
RDW: 14.7 % (ref 11.5–15.5)
WBC: 9.5 10*3/uL (ref 4.0–10.5)
nRBC: 0 % (ref 0.0–0.2)

## 2021-03-05 LAB — BASIC METABOLIC PANEL
Anion gap: 6 (ref 5–15)
BUN: 14 mg/dL (ref 6–20)
CO2: 30 mmol/L (ref 22–32)
Calcium: 7.9 mg/dL — ABNORMAL LOW (ref 8.9–10.3)
Chloride: 99 mmol/L (ref 98–111)
Creatinine, Ser: 0.73 mg/dL (ref 0.61–1.24)
GFR, Estimated: >60 mL/min (ref >60)
Glucose, Bld: 147 mg/dL — ABNORMAL HIGH (ref 70–99)
Potassium: 3.8 mmol/L (ref 3.5–5.1)
Sodium: 135 mmol/L (ref 135–145)

## 2021-03-05 MED ORDER — TRANEXAMIC ACID 650 MG PO TABS
1950.0000 mg | ORAL_TABLET | Freq: Every day | ORAL | Status: DC
  Administered 2021-03-05 – 2021-03-07 (×3): 1950 mg via ORAL
  Filled 2021-03-05 (×3): qty 3

## 2021-03-05 NOTE — Op Note (Signed)
NAME: Andrew Haley, LUCARELLI MEDICAL RECORD NO: [id number] ACCOUNT NO: [id number] DATE OF BIRTH: 1965/04/17 FACILITY: [id number] PHYSICIAN: Pietro Cassis. Alvan Dame, MD  Operative Report   DATE OF PROCEDURE: 03/04/2021  PREOPERATIVE DIAGNOSIS:  Failed right total knee arthroplasty due to aseptic loosening.  POSTOPERATIVE DIAGNOSIS:  Failed right total knee arthroplasty due to aseptic loosening.  FINDINGS:  See dictated operative note for details of findings and procedure.  PROCEDURE:  Revision right total knee arthroplasty.  COMPONENTS USED:  DePuy Attune revision knee system with a size 6 right Attune revision femoral component with a size 40 mm fully porous coated sleeve and a 22 x 60 mm press-fit stem.  On the tibial side, we used a size 4 revision tibial tray with a 53  fully porous coated sleeve with a 20 x 60 mm press-fit stem and a 7 mm posterior stabilized insert to match the size 6 femur.  SURGEON:  Pietro Cassis. Alvan Dame, MD.  ASSISTANT:  Costella Hatcher, PA-C.  Note that Ms. Lu Duffel was present for the entirety of the case from preoperative positioning, preoperative management of the operative extremity, general facilitation of the case and primary wound closure.  ANESTHESIA:  Regional plus spinal block.  SPECIMENS:  None.  COMPLICATIONS:  None apparent.  TOURNIQUET TIME:  Up for 88 minutes at 275 mmHg.  BLOOD LOSS: Noted to be 700 mL.  INDICATIONS:  The patient is a 56 year old male with history of right total knee replacement around 2016.  He presented to the office recently with increasing pain and dysfunction.  Examination reveals significant varus thrust and instability to the  right knee with radiographic findings of severe failure of his components with obvious loosening of both the femoral and tibial components with significant collapse of his tibial component into varus.  The indications were clear.  Clinically, there did  not appear to be concern for infection.  He was  scheduled for revision surgery.  We reviewed the risk of infection, DVT, component failure, need for future surgeries.  We discussed the postoperative course and expectations.  Consent was obtained for  benefit of pain relief.  DESCRIPTION OF PROCEDURE:  The patient was brought to the operative theater.  Once adequate anesthesia, preoperative antibiotics, Ancef administered as well as tranexamic acid and Decadron he was positioned supine with a right thigh tourniquet placed.   The right lower extremity was then prepped and draped in sterile fashion.  A time-out was performed identifying the patient, planned procedure, and extremity.  The leg was exsanguinated and tourniquet elevated to 275 mmHg due to elevated systolic  pressures.  His old incision was excised and soft tissue planes created.  A median arthrotomy was then made, encountering clear synovial fluid.  The initial portion of the case was carried out at the exposure and debridement.  I performed a complete knee  synovectomy from the medial to the suprapatellar to the lateral aspect of the knee including around the patella.  I did find the patellar button to be stable and thus it was kept in place.  Once I had the knee fully exposed, it was readily evident the  components were loose.  The femoral component was pulled off and the tibial component was lifted out of the proximal tibia.  We then removed remaining cement.  Once this was done, I debrided all the ends of the bone of all the fibrous nonviable tissue on  the distal femur and proximal tibia.  I then used a canal brush irrigator  to irrigate the canals in both sides.  We then began reaming with a reamers 14 mm and then increased to the mm increment up to 21 mm on the femoral side and 20 mm on the tibial  side.  I then broached on the tibial side and found that the 53 sleeve restored the proximal tibia anatomy with significant loss of bone in the proximal medial tibia.  Once I placed this tibial  tray, the tibial broach in place we put a tibial tray on top  of the broach and then focused on the femur.  Based on the significant bone loss in the distal femur that was noted, I felt that I needed to have a sleeve for support of the component.  The sleeve was impacted.  We ended up using a 40 mm fully porous  coated sleeve, which restored the joint line.  I did a trial reduction off this with a size 6 component.  I tried this initially, but ended up finding that this was the best way for me to balance the knee.  I did not need to upsize or downsize the  femoral component as the flexion and extension gaps matched each other with a size 6 insert initially.  Once we determined all of this, the final components were opened and configured on the back table by myself directly.  While this was being done, the  knee was irrigated with normal saline solution as well as Prontosan solution.  I injected the synovial capsular junction of the knee with 0.25% Marcaine with epinephrine and 1 mL of Toradol and saline.  I then mixed cement.  While this was being mixed,  the tibial component was impacted with a good Secur-Fit in the proximal aspect using a sleeve still noted significant bone loss medially.  I then placed the cement on the end of the femur and impacted into the multiple defects into the distal femur and  then impacted the stem.  We then placed a size 7 mm insert and found the knee came to full extension.  The knee was kept in extension until the cement fully cured.  Once the cement had fully cured, we selected a size 7 mm posterior stabilized insert  based on the stability of the medial and lateral collateral ligaments.  The knee was then reduced.  The patella was noted to track through the trochlea without application of pressure.  The knee was re-irrigated with normal saline solution and the  Prontosan solution.  I then used vancomycin powder and sprinkled a gram of vancomycin powder into the deep and  subcutaneous tissues.  The tourniquet had been let down after 88 minutes.  The extensor mechanism was reapproximated using #1 Vicryl and #1  Stratafix suture.  The remainder of the wound was closed in layers using 2-0 Vicryl and a running Monocryl stitch.  The knee was clean, dry and dressed sterilely using a surgical glue and Aquacel dressing.  The patient was then brought to the recovery  room in stable condition, tolerating the procedure well.  Findings were reviewed with his friend who brought him down.  We will have him work in the hospital at least 1-2 days for mobilization.  Initially keep him on partial weightbearing to allow for  bony ingrowth.   PUS D: 03/04/2021 5:13:50 pm T: 03/05/2021 3:16:00 am  JOB: [id number]/ [id number]

## 2021-03-05 NOTE — Progress Notes (Signed)
Pt refused CPAP qhs.  Pt encouraged to contact RT should he change his mind.   

## 2021-03-05 NOTE — Progress Notes (Signed)
Physical Therapy Treatment Patient Details Name: Andrew Haley MRN: [id number] DOB: 08-May-1965 Today's Date: 03/05/2021   History of Present Illness 56 yo male s/p R TKA rev 1/23. Hx of R TKA 2016, chronic pain, obesity, sleep apnea, prostate ca    PT Comments    Progressing with mobility. Pain better controlled this afternoon compared to this morning.    Recommendations for follow up therapy are one component of a multi-disciplinary discharge planning process, led by the attending physician.  Recommendations may be updated based on patient status, additional functional criteria and insurance authorization.  Follow Up Recommendations  Follow physician's recommendations for discharge plan and follow up therapies     Assistance Recommended at Discharge Intermittent Supervision/Assistance  Patient can return home with the following A little help with walking and/or transfers;A little help with bathing/dressing/bathroom;Assistance with cooking/housework;Assist for transportation;Help with stairs or ramp for entrance   Equipment Recommendations  None recommended by PT    Recommendations for Other Services       Precautions / Restrictions Precautions Precautions: Fall;Knee Restrictions Weight Bearing Restrictions: Yes RLE Weight Bearing: Partial weight bearing RLE Partial Weight Bearing Percentage or Pounds: 50%     Mobility  Bed Mobility Overal bed mobility: Needs Assistance Bed Mobility: Supine to Sit, Sit to Supine     Supine to sit: Min assist, HOB elevated Sit to supine: Min assist, HOB elevated   General bed mobility comments: Assist for R LE. Increased time. Cues provided.    Transfers Overall transfer level: Needs assistance Equipment used: Rolling walker (2 wheels) Transfers: Sit to/from Stand Sit to Stand: Min assist, From elevated surface           General transfer comment: Assist to power up, stabilize, control descent. Cues for safety, technique,  hand/LE placement.    Ambulation/Gait Ambulation/Gait assistance: Min assist Gait Distance (Feet): 115 Feet Assistive device: Rolling walker (2 wheels) Gait Pattern/deviations: Step-to pattern       General Gait Details: Cues for safety, pacing, technique, sequence, technique, adherence to PWB status. Assist to steady.   Stairs             Wheelchair Mobility    Modified Rankin (Stroke Patients Only)       Balance Overall balance assessment: Needs assistance         Standing balance support: Bilateral upper extremity supported, During functional activity, Reliant on assistive device for balance Standing balance-[name]: Poor                              Cognition Arousal/Alertness: Awake/alert Behavior During Therapy: WFL for tasks assessed/performed Overall Cognitive Status: Within Functional Limits for tasks assessed                                          Exercises      General Comments        Pertinent Vitals/Pain Pain Assessment Pain Assessment: 0-10 Pain Score: 5  Pain Location: R thigh Pain Descriptors / Indicators: Discomfort, Sore Pain Intervention(s): Monitored during session, Ice applied, Repositioned    Home Living Family/patient expects to be discharged to:: Private residence Living Arrangements: Alone (going to friend's "pool house") Available Help at Discharge: Friend(s);Available PRN/intermittently   Home Access: Level entry       Home Layout: One level Home Equipment: Counsellor (2 wheels);Cane -  single point Additional Comments: Pt states pool house is level with no steps.    Prior Function            PT Goals (current goals can now be found in the care plan section) Acute Rehab PT Goals Patient Stated Goal: less pain PT Goal Formulation: With patient Time For Goal Achievement: 03/19/21 Potential to Achieve Goals: Good Progress towards PT goals: Progressing toward  goals    Frequency    7X/week      PT Plan Current plan remains appropriate    Co-evaluation              AM-PAC PT "6 Clicks" Mobility   Outcome Measure  Help needed turning from your back to your side while in a flat bed without using bedrails?: A Little Help needed moving from lying on your back to sitting on the side of a flat bed without using bedrails?: A Little Help needed moving to and from a bed to a chair (including a wheelchair)?: A Little Help needed standing up from a chair using your arms (e.g., wheelchair or bedside chair)?: A Little Help needed to walk in hospital room?: A Little Help needed climbing 3-5 steps with a railing? : A Lot 6 Click Score: 17    End of Session Equipment Utilized During Treatment: Gait belt Activity Tolerance: Patient tolerated treatment well Patient left: in bed;with call bell/phone within reach   PT Visit Diagnosis: Pain;Other abnormalities of gait and mobility (R26.89) Pain - Right/Left: Right Pain - part of body: Knee     Time: 1610-9604 PT Time Calculation (min) (ACUTE ONLY): 24 min  Charges:  $Gait Training: 23-37 mins                         Doreatha Massed, PT Acute Rehabilitation  Office: [phone] Pager: [phone]

## 2021-03-05 NOTE — Anesthesia Postprocedure Evaluation (Signed)
Anesthesia Post Note  Patient: Andrew Haley  Procedure(s) Performed: TOTAL KNEE REVISION (Right: Knee)     Patient location during evaluation: PACU Anesthesia Type: Spinal Level of consciousness: awake and alert and oriented Pain management: pain level controlled Vital Signs Assessment: post-procedure vital signs reviewed and stable Respiratory status: spontaneous breathing, nonlabored ventilation and respiratory function stable Cardiovascular status: blood pressure returned to baseline Postop Assessment: no apparent nausea or vomiting, spinal receding, no headache and no backache Anesthetic complications: no   No notable events documented.           Marthenia Rolling

## 2021-03-05 NOTE — Evaluation (Signed)
Physical Therapy Evaluation Patient Details Name: Andrew Haley MRN: [id number] DOB: Sep 01, 1965 Today's Date: 03/05/2021  History of Present Illness  56 yo male s/p R TKA rev 1/23. Hx of R TKA 2016, chronic pain, obesity, sleep apnea, prostate ca  Clinical Impression  On eval, pt required Min A for mobility. He walked ~10 feet with a RW. Significant pain at rest and with activity. Will plan to follow and progress activity as tolerated.        Recommendations for follow up therapy are one component of a multi-disciplinary discharge planning process, led by the attending physician.  Recommendations may be updated based on patient status, additional functional criteria and insurance authorization.  Follow Up Recommendations Follow physician's recommendations for discharge plan and follow up therapies    Assistance Recommended at Discharge Intermittent Supervision/Assistance  Patient can return home with the following  A little help with walking and/or transfers;A little help with bathing/dressing/bathroom;Assistance with cooking/housework;Assist for transportation;Help with stairs or ramp for entrance    Equipment Recommendations None recommended by PT  Recommendations for Other Services       Functional Status Assessment Patient has had a recent decline in their functional status and demonstrates the ability to make significant improvements in function in a reasonable and predictable amount of time.     Precautions / Restrictions Precautions Precautions: Fall;Knee Restrictions Weight Bearing Restrictions: Yes RLE Weight Bearing: Partial weight bearing RLE Partial Weight Bearing Percentage or Pounds: 50%      Mobility  Bed Mobility Overal bed mobility: Needs Assistance Bed Mobility: Supine to Sit, Sit to Supine     Supine to sit: Min assist, HOB elevated Sit to supine: Min assist, HOB elevated   General bed mobility comments: Assist for R LE. Increased time. Cues provided.     Transfers Overall transfer level: Needs assistance Equipment used: Rolling walker (2 wheels) Transfers: Sit to/from Stand Sit to Stand: From elevated surface, Min assist           General transfer comment: Assist to power up, stabilize, control descent. Cues for safety, technique, hand/LE placement.    Ambulation/Gait Ambulation/Gait assistance: Min assist Gait Distance (Feet): 10 Feet Assistive device: Rolling walker (2 wheels) Gait Pattern/deviations: Step-to pattern, Antalgic       General Gait Details: Cues for safety, technique, sequence, technique, adherence to PWB status. Assist to steady.  Stairs            Wheelchair Mobility    Modified Rankin (Stroke Patients Only)       Balance Overall balance assessment: Needs assistance         Standing balance support: Bilateral upper extremity supported, During functional activity, Reliant on assistive device for balance Standing balance-[name]: Poor                               Pertinent Vitals/Pain Pain Assessment Pain Assessment: 0-10 Pain Score: 9  Pain Location: R thigh Pain Descriptors / Indicators: Spasm, Discomfort, Sore, Moaning Pain Intervention(s): Limited activity within patient's tolerance, Monitored during session, Ice applied, Heat applied (Heat to thigh only)    Home Living Family/patient expects to be discharged to:: Private residence Living Arrangements: Alone (going to friend's "pool house") Available Help at Discharge: Friend(s);Available PRN/intermittently   Home Access: Level entry       Home Layout: One level Home Equipment: Counsellor (2 wheels);Cane - single point Additional Comments: Pt states pool house is level with  no steps.    Prior Function Prior Level of Function : Independent/Modified Independent                     Hand Dominance   Dominant Hand: Right    Extremity/Trunk Assessment   Upper Extremity  Assessment Upper Extremity Assessment: Overall WFL for tasks assessed    Lower Extremity Assessment Lower Extremity Assessment: Generalized weakness    Cervical / Trunk Assessment Cervical / Trunk Assessment: Normal  Communication   Communication: No difficulties  Cognition Arousal/Alertness: Awake/alert Behavior During Therapy: WFL for tasks assessed/performed Overall Cognitive Status: Within Functional Limits for tasks assessed                                          General Comments      Exercises     Assessment/Plan    PT Assessment Patient needs continued PT services  PT Problem List Decreased strength;Decreased range of motion;Decreased mobility;Decreased coordination;Decreased activity tolerance;Decreased balance;Decreased knowledge of use of DME;Pain       PT Treatment Interventions DME instruction;Gait training;Therapeutic activities;Therapeutic exercise;Patient/family education;Balance training;Functional mobility training;Stair training    PT Goals (Current goals can be found in the Care Plan section)  Acute Rehab PT Goals Patient Stated Goal: less pain PT Goal Formulation: With patient Time For Goal Achievement: 03/19/21 Potential to Achieve Goals: Good    Frequency 7X/week     Co-evaluation               AM-PAC PT "6 Clicks" Mobility  Outcome Measure Help needed turning from your back to your side while in a flat bed without using bedrails?: A Little Help needed moving from lying on your back to sitting on the side of a flat bed without using bedrails?: A Little Help needed moving to and from a bed to a chair (including a wheelchair)?: A Little Help needed standing up from a chair using your arms (e.g., wheelchair or bedside chair)?: A Little Help needed to walk in hospital room?: A Little Help needed climbing 3-5 steps with a railing? : A Lot 6 Click Score: 17    End of Session   Activity Tolerance: Patient limited by  pain Patient left: in bed;with call bell/phone within reach   PT Visit Diagnosis: Pain;Other abnormalities of gait and mobility (R26.89) Pain - Right/Left: Right Pain - part of body: Knee    Time: 0981-1914 PT Time Calculation (min) (ACUTE ONLY): 28 min   Charges:   PT Evaluation $PT Eval Low Complexity: 1 Low PT Treatments $Gait Training: 8-22 mins          Doreatha Massed, PT Acute Rehabilitation  Office: [phone] Pager: [phone]

## 2021-03-05 NOTE — Progress Notes (Signed)
° °  Subjective: 1 Day Post-Op Procedure(s) (LRB): TOTAL KNEE REVISION (Right) Patient reports pain as mild.   Patient seen in rounds for Dr. Alvan Dame. Patient is well, and has had no acute complaints or problems. No acute events overnight. Foley catheter removed. Patient reports significant discomfort with movement, but not at rest. Patient has not worked with PT yet.  We will start therapy today.   Objective: Vital signs in last 24 hours: Temp:  [97.5 F (36.4 C)-98.3 F (36.8 C)] 97.9 F (36.6 C) (01/24 0620) Pulse Rate:  [65-102] 87 (01/24 0620) Resp:  [12-34] 18 (01/24 0620) BP: (138-180)/(82-116) 141/82 (01/24 0620) SpO2:  [94 %-100 %] 94 % (01/24 0620) Weight:  [115.7 kg] 115.7 kg (01/23 1149)  Intake/Output from previous day:  Intake/Output Summary (Last 24 hours) at 03/05/2021 0731 Last data filed at 03/05/2021 0600 Gross per 24 hour  Intake 4753.97 ml  Output 4250 ml  Net 503.97 ml     Intake/Output this shift: No intake/output data recorded.  Labs: Recent Labs    03/05/21 0321  HGB 7.8*   Recent Labs    03/05/21 0321  WBC 9.5  RBC 3.23*  HCT 26.0*  PLT 244   Recent Labs    03/05/21 0321  NA 135  K 3.8  CL 99  CO2 30  BUN 14  CREATININE 0.73  GLUCOSE 147*  CALCIUM 7.9*   No results for input(s): LABPT, INR in the last 72 hours.  Exam: General - Patient is Alert and Oriented Extremity - Neurologically intact Sensation intact distally Intact pulses distally Dorsiflexion/Plantar flexion intact Dressing - dressing C/D/I Motor Function - intact, moving foot and toes well on exam.   Past Medical History:  Diagnosis Date   Arthritis    Class 1 obesity 58/44/8185   Complication of anesthesia    slow to wake up   Essential hypertension 07/18/2020   Headache    Hypertension    Hypertriglyceridemia    Numbness of fingers of both hands    Prostate cancer ([place]) 2017   Sleep apnea    CPAP    Assessment/Plan: 1 Day Post-Op Procedure(s)  (LRB): TOTAL KNEE REVISION (Right) Principal Problem:   S/P revision of total knee, right  Estimated body mass index is 31.88 kg/m as calculated from the following:   Height as of this encounter: 6\' 3"  (1.905 m).   Weight as of this encounter: 115.7 kg. Advance diet Up with therapy D/C IV fluids  Anticipated LOS equal to or greater than 2 midnights due to - Age 5 and older with one or more of the following:  - Obesity  - Expected need for hospital services (PT, OT, Nursing) required for safe  discharge  - Anticipated need for postoperative skilled nursing care or inpatient rehab  - Active co-morbidities: Chronic pain requiring opiods OR   - Unanticipated findings during/Post Surgery: None  - Patient is a high risk of re-admission due to: None   DVT Prophylaxis - Aspirin PWB 50% RLE  ABLA: Hgb 7.8 this AM, stable. Will give oral TXA.   Plan is to go Home after hospital stay. Plan to get up with PT today to work on [facility] status. We discussed pain medication use. I do not want him to d/c home on the amount he is taking now, our goal will be for 15 q4h or less by the time he discharges.   Griffith Citron, PA-C Orthopedic Surgery [phone] 03/05/2021, 7:31 AM

## 2021-03-05 NOTE — TOC Transition Note (Signed)
Transition of Care [facility]) - CM/SW Discharge Note   Patient Details  Name: Andrew Haley MRN: [id number] Date of Birth: 1965-04-03  Transition of Care [facility]) CM/SW Contact:  Lennart Pall, LCSW Phone Number: 03/05/2021, 12:44 PM   Clinical Narrative:    Met with pt this morning and confirming he has needed DME at home.  Plan for OPPT at [facility] in [place].  No further TOC needs.   Final next level of care: OP Rehab Barriers to Discharge: No Barriers Identified   Patient Goals and CMS Choice Patient states their goals for this hospitalization and ongoing recovery are:: return home      Discharge Placement                       Discharge Plan and Services                DME Arranged: N/A DME Agency: NA                  Social Determinants of Health (SDOH) Interventions     Readmission Risk Interventions Readmission Risk Prevention Plan 07/30/2020  Post Dischage Appt Complete  Medication Screening Complete  Transportation Screening Complete  Some recent data might be hidden

## 2021-03-06 ENCOUNTER — Other Ambulatory Visit: Payer: Self-pay

## 2021-03-06 LAB — CBC
HCT: 24.8 % — ABNORMAL LOW (ref 39.0–52.0)
Hemoglobin: 7.3 g/dL — ABNORMAL LOW (ref 13.0–17.0)
MCH: 23.9 pg — ABNORMAL LOW (ref 26.0–34.0)
MCHC: 29.4 g/dL — ABNORMAL LOW (ref 30.0–36.0)
MCV: 81.3 fL (ref 80.0–100.0)
Platelets: 219 10*3/uL (ref 150–400)
RBC: 3.05 MIL/uL — ABNORMAL LOW (ref 4.22–5.81)
RDW: 14.8 % (ref 11.5–15.5)
WBC: 8.2 10*3/uL (ref 4.0–10.5)
nRBC: 0 % (ref 0.0–0.2)

## 2021-03-06 MED ORDER — CYCLOBENZAPRINE HCL 10 MG PO TABS
10.0000 mg | ORAL_TABLET | Freq: Three times a day (TID) | ORAL | Status: DC | PRN
  Administered 2021-03-06 – 2021-03-07 (×3): 10 mg via ORAL
  Filled 2021-03-06 (×3): qty 1

## 2021-03-06 NOTE — Progress Notes (Signed)
Physical Therapy Treatment Patient Details Name: Andrew Haley MRN: [id number] DOB: 1965-03-14 Today's Date: 03/06/2021   History of Present Illness 56 yo male s/p R TKA rev 1/23. Hx of R TKA 2016, chronic pain, obesity, sleep apnea, prostate ca    PT Comments    Pt continues to participate well. Experiencing some increased pain on today and he had to take a couple brief standing rest breaks during walk. Will continue to follow and progress activity as tolerated.    Recommendations for follow up therapy are one component of a multi-disciplinary discharge planning process, led by the attending physician.  Recommendations may be updated based on patient status, additional functional criteria and insurance authorization.  Follow Up Recommendations  Follow physician's recommendations for discharge plan and follow up therapies     Assistance Recommended at Discharge Intermittent Supervision/Assistance  Patient can return home with the following A little help with walking and/or transfers;A little help with bathing/dressing/bathroom;Assistance with cooking/housework;Assist for transportation;Help with stairs or ramp for entrance   Equipment Recommendations  None recommended by PT    Recommendations for Other Services       Precautions / Restrictions Precautions Precautions: Fall;Knee Restrictions Weight Bearing Restrictions: Yes RLE Weight Bearing: Partial weight bearing RLE Partial Weight Bearing Percentage or Pounds: 50%     Mobility  Bed Mobility Overal bed mobility: Needs Assistance Bed Mobility: Supine to Sit, Sit to Supine     Supine to sit: Min assist, HOB elevated Sit to supine: Min assist, HOB elevated   General bed mobility comments: Assist for R LE. Increased time. Cues provided.    Transfers Overall transfer level: Needs assistance Equipment used: Rolling walker (2 wheels) Transfers: Sit to/from Stand Sit to Stand: Min assist           General transfer  comment: Assist to power up, stabilize, control descent. Cues for safety, technique, hand/LE placement.    Ambulation/Gait Ambulation/Gait assistance: Min assist Gait Distance (Feet): 75 Feet Assistive device: Rolling walker (2 wheels) Gait Pattern/deviations: Step-to pattern       General Gait Details: Cues for safety, pacing, technique, sequence, technique, adherence to PWB status. Assist to steady. 2 brief standing rest breaks on today.   Stairs             Wheelchair Mobility    Modified Rankin (Stroke Patients Only)       Balance Overall balance assessment: Needs assistance         Standing balance support: Bilateral upper extremity supported, During functional activity, Reliant on assistive device for balance Standing balance-[name]: Poor                              Cognition Arousal/Alertness: Awake/alert Behavior During Therapy: WFL for tasks assessed/performed Overall Cognitive Status: Within Functional Limits for tasks assessed                                          Exercises Total Joint Exercises Ankle Circles/Pumps: AROM, Both, 10 reps Quad Sets: AROM, Right, 10 reps Heel Slides: AAROM, Right, 10 reps Goniometric ROM: ~10-50 degrees    General Comments        Pertinent Vitals/Pain Pain Assessment Pain Assessment: 0-10 Pain Score: 7  Pain Location: R thigh Pain Descriptors / Indicators: Discomfort, Sore Pain Intervention(s): Limited activity within patient's tolerance, Monitored during  session, Premedicated before session, Ice applied, Heat applied, Repositioned (heat to thigh only)    Home Living                          Prior Function            PT Goals (current goals can now be found in the care plan section) Progress towards PT goals: Progressing toward goals    Frequency    7X/week      PT Plan Current plan remains appropriate    Co-evaluation               AM-PAC PT "6 Clicks" Mobility   Outcome Measure  Help needed turning from your back to your side while in a flat bed without using bedrails?: A Little Help needed moving from lying on your back to sitting on the side of a flat bed without using bedrails?: A Little Help needed moving to and from a bed to a chair (including a wheelchair)?: A Little Help needed standing up from a chair using your arms (e.g., wheelchair or bedside chair)?: A Little Help needed to walk in hospital room?: A Little Help needed climbing 3-5 steps with a railing? : A Lot 6 Click Score: 17    End of Session Equipment Utilized During Treatment: Gait belt Activity Tolerance: Patient tolerated treatment well Patient left: in bed;with call bell/phone within reach;with bed alarm set   PT Visit Diagnosis: Pain;Other abnormalities of gait and mobility (R26.89) Pain - Right/Left: Right Pain - part of body: Knee     Time: 1200-1226 PT Time Calculation (min) (ACUTE ONLY): 26 min  Charges:  $Gait Training: 8-22 mins $Therapeutic Exercise: 8-22 mins                         Doreatha Massed, PT Acute Rehabilitation  Office: [phone] Pager: [phone]

## 2021-03-06 NOTE — Progress Notes (Signed)
Physical Therapy Treatment Patient Details Name: Andrew Haley MRN: [id number] DOB: September 06, 1965 Today's Date: 03/06/2021   History of Present Illness 56 yo male s/p R TKA rev 1/23. Hx of R TKA 2016, chronic pain, obesity, sleep apnea, prostate ca    PT Comments    Pt continues to participate well. Moderate pain with activity. Encouraged him to perform quad sets.    Recommendations for follow up therapy are one component of a multi-disciplinary discharge planning process, led by the attending physician.  Recommendations may be updated based on patient status, additional functional criteria and insurance authorization.  Follow Up Recommendations  Follow physician's recommendations for discharge plan and follow up therapies     Assistance Recommended at Discharge Intermittent Supervision/Assistance  Patient can return home with the following A little help with walking and/or transfers;A little help with bathing/dressing/bathroom;Assistance with cooking/housework;Assist for transportation;Help with stairs or ramp for entrance   Equipment Recommendations  None recommended by PT    Recommendations for Other Services       Precautions / Restrictions Precautions Precautions: Fall;Knee Restrictions Weight Bearing Restrictions: Yes RLE Weight Bearing: Partial weight bearing RLE Partial Weight Bearing Percentage or Pounds: 50%     Mobility  Bed Mobility Overal bed mobility: Needs Assistance Bed Mobility: Supine to Sit, Sit to Supine     Supine to sit: Min assist, HOB elevated Sit to supine: Min assist, HOB elevated   General bed mobility comments: Assist for R LE. Increased time. Cues provided.    Transfers Overall transfer level: Needs assistance Equipment used: Rolling walker (2 wheels) Transfers: Sit to/from Stand Sit to Stand: Min assist, From elevated surface           General transfer comment: Assist to power up, stabilize, control descent. Cues for safety,  technique, hand/LE placement. Highly elevated bed.     Ambulation/Gait Ambulation/Gait assistance: Min assist Gait Distance (Feet): 75 Feet Assistive device: Rolling walker (2 wheels) Gait Pattern/deviations: Step-to pattern       General Gait Details: Cues for safety, pacing, adherence to PWB status. Assist to steady. 1 brief standing rest break on today.   Stairs             Wheelchair Mobility    Modified Rankin (Stroke Patients Only)       Balance Overall balance assessment: Needs assistance         Standing balance support: Bilateral upper extremity supported, During functional activity, Reliant on assistive device for balance Standing balance-Andrew Haley Scale: Poor                              Cognition Arousal/Alertness: Awake/alert Behavior During Therapy: WFL for tasks assessed/performed Overall Cognitive Status: Within Functional Limits for tasks assessed                                          Exercises     General Comments        Pertinent Vitals/Pain Pain Assessment Pain Assessment: 0-10 Pain Score: 7  Pain Location: R thigh/knee Pain Descriptors / Indicators: Discomfort, Sore Pain Intervention(s): Limited activity within patient's tolerance, Monitored during session, Repositioned    Home Living                          Prior Function  PT Goals (current goals can now be found in the care plan section) Progress towards PT goals: Progressing toward goals    Frequency    7X/week      PT Plan Current plan remains appropriate    Co-evaluation              AM-PAC PT "6 Clicks" Mobility   Outcome Measure  Help needed turning from your back to your side while in a flat bed without using bedrails?: A Little Help needed moving from lying on your back to sitting on the side of a flat bed without using bedrails?: A Little Help needed moving to and from a bed to a chair  (including a wheelchair)?: A Little Help needed standing up from a chair using your arms (e.g., wheelchair or bedside chair)?: A Little Help needed to walk in hospital room?: A Little Help needed climbing 3-5 steps with a railing? : A Lot 6 Click Score: 17    End of Session Equipment Utilized During Treatment: Gait belt Activity Tolerance: Patient tolerated treatment well Patient left: in bed;with call bell/phone within reach;with bed alarm set   PT Visit Diagnosis: Pain;Other abnormalities of gait and mobility (R26.89) Pain - Right/Left: Right Pain - part of body: Knee     Time: 0981-1914 PT Time Calculation (min) (ACUTE ONLY): 17 min  Charges:  $Gait Training: 8-22 mins $Therapeutic Exercise: 8-22 mins                         Doreatha Massed, PT Acute Rehabilitation  Office: [phone] Pager: [phone]

## 2021-03-06 NOTE — Progress Notes (Signed)
° °  Subjective: 2 Days Post-Op Procedure(s) (LRB): TOTAL KNEE REVISION (Right) Patient reports pain as mild.   Patient seen in rounds by Dr. Alvan Dame. Patient is well, and has had no acute complaints or problems. No acute events overnight. Patient ambulated 115 feet with PT. Voiding without difficulty. We will continue therapy today.   Objective: Vital signs in last 24 hours: Temp:  [97.8 F (36.6 C)-98.2 F (36.8 C)] 97.8 F (36.6 C) (01/25 0547) Pulse Rate:  [79-86] 79 (01/25 0547) Resp:  [17-20] 17 (01/25 0547) BP: (126-159)/(74-81) 126/76 (01/25 0547) SpO2:  [92 %-99 %] 92 % (01/25 0547)  Intake/Output from previous day:  Intake/Output Summary (Last 24 hours) at 03/06/2021 0812 Last data filed at 03/05/2021 2125 Gross per 24 hour  Intake 1155.75 ml  Output 1450 ml  Net -294.25 ml     Intake/Output this shift: No intake/output data recorded.  Labs: Recent Labs    03/05/21 0321 03/06/21 0319  HGB 7.8* 7.3*   Recent Labs    03/05/21 0321 03/06/21 0319  WBC 9.5 8.2  RBC 3.23* 3.05*  HCT 26.0* 24.8*  PLT 244 219   Recent Labs    03/05/21 0321  NA 135  K 3.8  CL 99  CO2 30  BUN 14  CREATININE 0.73  GLUCOSE 147*  CALCIUM 7.9*   No results for input(s): LABPT, INR in the last 72 hours.  Exam: General - Patient is Alert and Oriented Extremity - Neurologically intact Sensation intact distally Intact pulses distally Dorsiflexion/Plantar flexion intact Dressing - dressing C/D/I Motor Function - intact, moving foot and toes well on exam.   Past Medical History:  Diagnosis Date   Arthritis    Class 1 obesity 52/77/8242   Complication of anesthesia    slow to wake up   Essential hypertension 07/18/2020   Headache    Hypertension    Hypertriglyceridemia    Numbness of fingers of both hands    Prostate cancer ([place]) 2017   Sleep apnea    CPAP    Assessment/Plan: 2 Days Post-Op Procedure(s) (LRB): TOTAL KNEE REVISION (Right) Principal Problem:    S/P revision of total knee, right  Estimated body mass index is 31.88 kg/m as calculated from the following:   Height as of this encounter: 6\' 3"  (1.905 m).   Weight as of this encounter: 115.7 kg. Advance diet Up with therapy  Anticipated LOS equal to or greater than 2 midnights due to - Age 58 and older with one or more of the following:  - Obesity  - Expected need for hospital services (PT, OT, Nursing) required for safe  discharge  - Anticipated need for postoperative skilled nursing care or inpatient rehab  - Active co-morbidities: Chronic pain requiring opiods OR   - Unanticipated findings during/Post Surgery: Slow post-op progression: GI, pain control, mobility and ABLA   - Patient is a high risk of re-admission due to: None   DVT Prophylaxis - Aspirin Weight bearing as tolerated.  ABLA: hgb 7.3 this AM. Will recheck hgb tomorrow. Transfusion if <7.   Plan is to go Home after hospital stay. Plan to get up with PT today. Hopeful discharge home tomorrow.   Griffith Citron, PA-C Orthopedic Surgery [phone] 03/06/2021, 8:12 AM

## 2021-03-06 NOTE — Progress Notes (Signed)
Patient continues to refuse nocturnal CPAP. He has his home mask and tubing at bedside but does not wish to add "one more thing" to wear. He is aware that he may request assistance at any time if he should change his mind and need a machine. Order changed to prn.

## 2021-03-07 LAB — HEMOGLOBIN AND HEMATOCRIT, BLOOD
HCT: 30 % — ABNORMAL LOW (ref 39.0–52.0)
Hemoglobin: 9.2 g/dL — ABNORMAL LOW (ref 13.0–17.0)

## 2021-03-07 LAB — CBC
HCT: 24.6 % — ABNORMAL LOW (ref 39.0–52.0)
Hemoglobin: 7.3 g/dL — ABNORMAL LOW (ref 13.0–17.0)
MCH: 24.3 pg — ABNORMAL LOW (ref 26.0–34.0)
MCHC: 29.7 g/dL — ABNORMAL LOW (ref 30.0–36.0)
MCV: 81.7 fL (ref 80.0–100.0)
Platelets: 266 10*3/uL (ref 150–400)
RBC: 3.01 MIL/uL — ABNORMAL LOW (ref 4.22–5.81)
RDW: 15 % (ref 11.5–15.5)
WBC: 7.6 10*3/uL (ref 4.0–10.5)
nRBC: 0 % (ref 0.0–0.2)

## 2021-03-07 LAB — PREPARE RBC (CROSSMATCH)

## 2021-03-07 MED ORDER — NALOXONE HCL 4 MG/0.1ML NA LIQD: NASAL | 0 refills | Status: DC

## 2021-03-07 MED ORDER — SODIUM CHLORIDE 0.9% IV SOLUTION: Freq: Once | INTRAVENOUS | Status: AC

## 2021-03-07 MED ORDER — OXYCODONE HCL 5 MG PO TABS: 5.0000 mg | ORAL_TABLET | ORAL | 0 refills | Status: DC | PRN

## 2021-03-07 MED ORDER — POLYETHYLENE GLYCOL 3350 17 G PO PACK: 17.0000 g | PACK | Freq: Every day | ORAL | 0 refills | Status: DC | PRN

## 2021-03-07 MED ORDER — FERROUS SULFATE 325 (65 FE) MG PO TABS: 325.0000 mg | ORAL_TABLET | Freq: Three times a day (TID) | ORAL | 0 refills | Status: DC

## 2021-03-07 MED ORDER — ASPIRIN 81 MG PO CHEW: 81.0000 mg | CHEWABLE_TABLET | Freq: Two times a day (BID) | ORAL | 0 refills | Status: AC

## 2021-03-07 MED ORDER — OXYCODONE HCL 5 MG PO TABS: 15.0000 mg | ORAL_TABLET | ORAL | Status: DC | PRN

## 2021-03-07 MED ORDER — CYCLOBENZAPRINE HCL 10 MG PO TABS: 10.0000 mg | ORAL_TABLET | Freq: Three times a day (TID) | ORAL | 0 refills | Status: DC | PRN

## 2021-03-07 MED ORDER — ACETAMINOPHEN 325 MG PO TABS: 1000.0000 mg | ORAL_TABLET | Freq: Four times a day (QID) | ORAL | Status: DC

## 2021-03-07 MED ORDER — DOCUSATE SODIUM 100 MG PO CAPS: 100.0000 mg | ORAL_CAPSULE | Freq: Two times a day (BID) | ORAL | 0 refills | Status: DC

## 2021-03-07 NOTE — Care Management Important Message (Signed)
Important Message  Patient Details IM Letter given to the Patient. Name: Andrew Haley MRN: [id number] Date of Birth: 09-15-1965   Medicare Important Message Given:  Yes     Kerin Salen 03/07/2021, 2:43 PM

## 2021-03-07 NOTE — Progress Notes (Signed)
Physical Therapy Treatment Patient Details Name: Andrew Haley MRN: [id number] DOB: 1965/02/14 Today's Date: 03/07/2021   History of Present Illness 56 yo male s/p R TKA rev 1/23. Hx of R TKA 2016, chronic pain, obesity, sleep apnea, prostate ca    PT Comments    POD # 1 am session withheld for blood transfusion Pm session assisted OOB to amb a functional distance General Gait Details: Cues for safety, pacing, adherence to PWB status. Assist to steady. 1 brief standing rest break. Then returned to room to perform some TE's following HEP handout.  Instructed on proper tech, freq as well as use of ICE.   Addressed all mobility questions, discussed appropriate activity, educated on use of ICE.  Pt ready for D/C to home.   Recommendations for follow up therapy are one component of a multi-disciplinary discharge planning process, led by the attending physician.  Recommendations may be updated based on patient status, additional functional criteria and insurance authorization.  Follow Up Recommendations  Follow physician's recommendations for discharge plan and follow up therapies     Assistance Recommended at Discharge Intermittent Supervision/Assistance  Patient can return home with the following A little help with walking and/or transfers;A little help with bathing/dressing/bathroom;Assistance with cooking/housework;Assist for transportation;Help with stairs or ramp for entrance   Equipment Recommendations  None recommended by PT    Recommendations for Other Services       Precautions / Restrictions Precautions Precautions: Fall;Knee Precaution Comments: instructed no pillow under knee Restrictions Weight Bearing Restrictions: No RLE Weight Bearing: Partial weight bearing     Mobility  Bed Mobility Overal bed mobility: Needs Assistance Bed Mobility: Supine to Sit     Supine to sit: Supervision     General bed mobility comments: pt self able with use of strap and increased  time    Transfers Overall transfer level: Needs assistance Equipment used: Rolling walker (2 wheels) Transfers: Sit to/from Stand Sit to Stand: Supervision           General transfer comment: pt self able with increased time and VC's for safety as pt is a bit impulsive    Ambulation/Gait Ambulation/Gait assistance: Supervision Gait Distance (Feet): 55 Feet Assistive device: Rolling walker (2 wheels) Gait Pattern/deviations: Step-to pattern Gait velocity: decreased     General Gait Details: Cues for safety, pacing, adherence to PWB status. Assist to steady. 1 brief standing rest break.   Stairs Stairs:  (no steps to enter "[employer]")           Engineer, building services [employer] (Stroke Patients Only)       Balance                                            Cognition Arousal/Alertness: Awake/alert Behavior During Therapy: WFL for tasks assessed/performed Overall Cognitive Status: Within Functional Limits for tasks assessed                                 General Comments: AxO x 3 distracted by pain.  Repeat instructions.        Exercises  Total Knee Replacement TE's following HEP handout 10 reps B LE ankle pumps 05 reps towel squeezes 05 reps knee presses 05 reps heel slides  05 reps SAQ's 05 reps SLR's 05 reps ABD Educated  on use of gait belt to assist with TE's Followed by ICE     General Comments        Pertinent Vitals/Pain Pain Assessment Pain Assessment: Faces Faces Pain Scale: Hurts whole lot Pain Location: R thigh/knee Pain Descriptors / Indicators: Discomfort, Sore, Grimacing, Tender, Spasm Pain Intervention(s): Monitored during session, Premedicated before session, Repositioned, Ice applied    Home Living                          Prior Function            PT Goals (current goals can now be found in the care plan section) Progress towards PT goals: Progressing toward  goals    Frequency    7X/week      PT Plan Current plan remains appropriate    Co-evaluation              AM-PAC PT "6 Clicks" Mobility   Outcome Measure  Help needed turning from your back to your side while in a flat bed without using bedrails?: None Help needed moving from lying on your back to sitting on the side of a flat bed without using bedrails?: A Little Help needed moving to and from a bed to a chair (including a wheelchair)?: A Little Help needed standing up from a chair using your arms (e.g., wheelchair or bedside chair)?: A Little Help needed to walk in hospital room?: A Little Help needed climbing 3-5 steps with a railing? : A Little 6 Click Score: 19    End of Session Equipment Utilized During Treatment: Gait belt Activity Tolerance: Patient tolerated treatment well Patient left: in chair;with call bell/phone within reach;with family/visitor present Nurse Communication: Mobility status PT Visit Diagnosis: Pain;Other abnormalities of gait and mobility (R26.89) Pain - Right/Left: Right Pain - part of body: Knee     Time: 5102-5852 PT Time Calculation (min) (ACUTE ONLY): 27 min  Charges:  $Gait Training: 8-22 mins $Therapeutic Activity: 8-22 mins                    Rica Koyanagi  PTA Acute  Rehabilitation Services Pager      [phone] Office      [phone]

## 2021-03-07 NOTE — Plan of Care (Signed)
Patient discharged home via private vehicle with friend Maryjo [place]. Ivan Anchors, RN 03/07/21 3:29 PM

## 2021-03-07 NOTE — Plan of Care (Signed)
Problem: Education: Goal: Knowledge of the prescribed therapeutic regimen will improve Outcome: Progressing   Problem: Activity: Goal: Ability to avoid complications of mobility impairment will improve Outcome: Progressing   Problem: Activity: Goal: Range of joint motion will improve Outcome: [place], RN 03/07/21 2:15 PM

## 2021-03-07 NOTE — Progress Notes (Addendum)
° °  Subjective: 3 Days Post-Op Procedure(s) (LRB): TOTAL KNEE REVISION (Right) Patient reports pain as mild.   Patient seen in rounds with Dr. Alvan Dame. Patient is well, and has had no acute complaints or problems. Voiding without difficulty. Patient ambulated 75 feet with PT.  We will continue therapy today.   Objective: Vital signs in last 24 hours: Temp:  [97.6 F (36.4 C)-98 F (36.7 C)] 97.6 F (36.4 C) (01/26 0538) Pulse Rate:  [76-95] 76 (01/26 0538) Resp:  [17-18] 18 (01/26 0538) BP: (125-151)/(73-83) 125/73 (01/26 0538) SpO2:  [95 %-99 %] 95 % (01/26 0538)  Intake/Output from previous day:  Intake/Output Summary (Last 24 hours) at 03/07/2021 0719 Last data filed at 03/07/2021 0331 Gross per 24 hour  Intake 460 ml  Output 1600 ml  Net -1140 ml     Intake/Output this shift: No intake/output data recorded.  Labs: Recent Labs    03/05/21 0321 03/06/21 0319 03/07/21 0303  HGB 7.8* 7.3* 7.3*   Recent Labs    03/06/21 0319 03/07/21 0303  WBC 8.2 7.6  RBC 3.05* 3.01*  HCT 24.8* 24.6*  PLT 219 266   Recent Labs    03/05/21 0321  NA 135  K 3.8  CL 99  CO2 30  BUN 14  CREATININE 0.73  GLUCOSE 147*  CALCIUM 7.9*   No results for input(s): LABPT, INR in the last 72 hours.  Exam: General - Patient is Alert and Oriented Extremity - Neurologically intact Sensation intact distally Intact pulses distally Dorsiflexion/Plantar flexion intact Dressing - dressing C/D/I Motor Function - intact, moving foot and toes well on exam.   Past Medical History:  Diagnosis Date   Arthritis    Class 1 obesity 03/50/0938   Complication of anesthesia    slow to wake up   Essential hypertension 07/18/2020   Headache    Hypertension    Hypertriglyceridemia    Numbness of fingers of both hands    Prostate cancer ([place]) 2017   Sleep apnea    CPAP    Assessment/Plan: 3 Days Post-Op Procedure(s) (LRB): TOTAL KNEE REVISION (Right) Principal Problem:   S/P revision  of total knee, right  Estimated body mass index is 31.88 kg/m as calculated from the following:   Height as of this encounter: 6\' 3"  (1.905 m).   Weight as of this encounter: 115.7 kg. Advance diet Up with therapy    DVT Prophylaxis - Aspirin PWB RLE 50%  ABLA: Hgb stable today at 7.3, but will go ahead with transfusion of 1 unit prior to discharge.   Plan is to go Home after hospital stay.   Minimize/avoid IV pain medication today in preparation for home discharge. Discussed home pain meds.   Plan for discharge today after 1 unit of blood as long as they are meeting their goals. Patient is scheduled for OPPT. Follow up in the office in 2 weeks.   Griffith Citron, PA-C Orthopedic Surgery [phone] 03/07/2021, 7:19 AM

## 2021-03-08 LAB — TYPE AND SCREEN
ABO/RH(D): A POS
Antibody Screen: NEGATIVE
Unit division: 0

## 2021-03-08 LAB — BPAM RBC
Blood Product Expiration Date: 202302132359
ISSUE DATE / TIME: 202301260946
Unit Type and Rh: 6200

## 2021-03-11 DIAGNOSIS — Z6832 Body mass index (BMI) 32.0-32.9, adult: Secondary | ICD-10-CM | POA: Diagnosis not present

## 2021-03-11 DIAGNOSIS — Z96651 Presence of right artificial knee joint: Secondary | ICD-10-CM | POA: Diagnosis not present

## 2021-03-11 DIAGNOSIS — G894 Chronic pain syndrome: Secondary | ICD-10-CM | POA: Diagnosis not present

## 2021-03-11 DIAGNOSIS — M179 Osteoarthritis of knee, unspecified: Secondary | ICD-10-CM | POA: Diagnosis not present

## 2021-03-11 DIAGNOSIS — R2689 Other abnormalities of gait and mobility: Secondary | ICD-10-CM | POA: Diagnosis not present

## 2021-03-11 DIAGNOSIS — M25661 Stiffness of right knee, not elsewhere classified: Secondary | ICD-10-CM | POA: Diagnosis not present

## 2021-03-11 DIAGNOSIS — T84012A Broken internal right knee prosthesis, initial encounter: Secondary | ICD-10-CM | POA: Diagnosis not present

## 2021-03-11 DIAGNOSIS — Z471 Aftercare following joint replacement surgery: Secondary | ICD-10-CM | POA: Diagnosis not present

## 2021-03-11 DIAGNOSIS — E669 Obesity, unspecified: Secondary | ICD-10-CM | POA: Diagnosis not present

## 2021-03-11 DIAGNOSIS — M6281 Muscle weakness (generalized): Secondary | ICD-10-CM | POA: Diagnosis not present

## 2021-03-13 DIAGNOSIS — Z471 Aftercare following joint replacement surgery: Secondary | ICD-10-CM | POA: Diagnosis not present

## 2021-03-13 DIAGNOSIS — M6281 Muscle weakness (generalized): Secondary | ICD-10-CM | POA: Diagnosis not present

## 2021-03-13 DIAGNOSIS — R2689 Other abnormalities of gait and mobility: Secondary | ICD-10-CM | POA: Diagnosis not present

## 2021-03-13 DIAGNOSIS — M25661 Stiffness of right knee, not elsewhere classified: Secondary | ICD-10-CM | POA: Diagnosis not present

## 2021-03-13 DIAGNOSIS — Z96651 Presence of right artificial knee joint: Secondary | ICD-10-CM | POA: Diagnosis not present

## 2021-03-15 DIAGNOSIS — Z96651 Presence of right artificial knee joint: Secondary | ICD-10-CM | POA: Diagnosis not present

## 2021-03-15 DIAGNOSIS — R2689 Other abnormalities of gait and mobility: Secondary | ICD-10-CM | POA: Diagnosis not present

## 2021-03-15 DIAGNOSIS — M25661 Stiffness of right knee, not elsewhere classified: Secondary | ICD-10-CM | POA: Diagnosis not present

## 2021-03-15 DIAGNOSIS — M6281 Muscle weakness (generalized): Secondary | ICD-10-CM | POA: Diagnosis not present

## 2021-03-15 DIAGNOSIS — Z471 Aftercare following joint replacement surgery: Secondary | ICD-10-CM | POA: Diagnosis not present

## 2021-03-18 DIAGNOSIS — M25661 Stiffness of right knee, not elsewhere classified: Secondary | ICD-10-CM | POA: Diagnosis not present

## 2021-03-18 DIAGNOSIS — M6281 Muscle weakness (generalized): Secondary | ICD-10-CM | POA: Diagnosis not present

## 2021-03-18 DIAGNOSIS — Z96651 Presence of right artificial knee joint: Secondary | ICD-10-CM | POA: Diagnosis not present

## 2021-03-18 DIAGNOSIS — Z471 Aftercare following joint replacement surgery: Secondary | ICD-10-CM | POA: Diagnosis not present

## 2021-03-18 DIAGNOSIS — R2689 Other abnormalities of gait and mobility: Secondary | ICD-10-CM | POA: Diagnosis not present

## 2021-03-21 NOTE — Discharge Summary (Signed)
Physician Discharge Summary   Patient ID: Andrew Haley MRN: [id number] DOB/AGE: [birth year] 56 y.o.  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  Admission Diagnoses:  Past Medical History:  Diagnosis Date   Arthritis    Class 1 obesity 00/37/0488   Complication of anesthesia    slow to wake up   Essential hypertension 07/18/2020   Headache    Hypertension    Hypertriglyceridemia    Numbness of fingers of both hands    Prostate cancer ([place]) 2017   Sleep apnea    CPAP   Discharge Diagnoses:   Principal Problem:   S/P revision of total knee, right  Estimated body mass index is 31.88 kg/m as calculated from the following:   Height as of this encounter: $RemoveBeforeD'6\' 3"'[id number]$  (1.905 m).   Weight as of this encounter: 115.7 kg.  Procedure:  Procedure(s) (LRB): TOTAL KNEE REVISION (Right)   Consults: None  HPI: The patient is a 56 year old male with history of right total knee replacement around 2016.  He presented to the office recently with increasing pain and dysfunction.  Examination reveals significant varus thrust and instability to the  right knee with radiographic findings of severe failure of his components with obvious loosening of both the femoral and tibial components with significant collapse of his tibial component into varus.  The indications were clear.  Clinically, there did  not appear to be concern for infection.  He was scheduled for revision surgery.  We reviewed the risk of infection, DVT, component failure, need for future surgeries.  We discussed the postoperative course and expectations.  Consent was obtained for  benefit of pain relief.  Laboratory Data: Admission on 03/04/2021, Discharged on 03/07/2021  Component Date Value Ref Range Status   WBC 03/05/2021 9.5  4.0 - 10.5 K/uL Final   RBC 03/05/2021 3.23 (L)  4.22 - 5.81 MIL/uL Final   Hemoglobin 03/05/2021 7.8 (L)  13.0 -  17.0 g/dL Final   HCT 03/05/2021 26.0 (L)  39.0 - 52.0 % Final   MCV 03/05/2021 80.5  80.0 - 100.0 fL Final   MCH 03/05/2021 24.1 (L)  26.0 - 34.0 pg Final   MCHC 03/05/2021 30.0  30.0 - 36.0 g/dL Final   RDW 03/05/2021 14.7  11.5 - 15.5 % Final   Platelets 03/05/2021 244  150 - 400 K/uL Final   nRBC 03/05/2021 0.0  0.0 - 0.2 % Final   Performed at [facility], [place] [address], [place], [place] [address]   Sodium 03/05/2021 135  135 - 145 mmol/L Final   Potassium 03/05/2021 3.8  3.5 - 5.1 mmol/L Final   Chloride 03/05/2021 99  98 - 111 mmol/L Final   CO2 03/05/2021 30  22 - 32 mmol/L Final   Glucose, Bld 03/05/2021 147 (H)  70 - 99 mg/dL Final   Glucose reference range applies only to samples taken after fasting for at least 8 hours.   BUN 03/05/2021 14  6 - 20 mg/dL Final   Creatinine, Ser 03/05/2021 0.73  0.61 - 1.24 mg/dL Final   Calcium 03/05/2021 7.9 (L)  8.9 - 10.3 mg/dL Final   GFR, Estimated 03/05/2021 >60  >60 mL/min Final   Comment: (NOTE) Calculated using the CKD-EPI Creatinine Equation (2021)    Anion gap 03/05/2021 6  5 - 15 Final   Performed at [facility], [place] [address] [place], [place] [address]  WBC 03/06/2021 8.2  4.0 - 10.5 K/uL Final   RBC 03/06/2021 3.05 (L)  4.22 - 5.81 MIL/uL Final   Hemoglobin 03/06/2021 7.3 (L)  13.0 - 17.0 g/dL Final   HCT 03/06/2021 24.8 (L)  39.0 - 52.0 % Final   MCV 03/06/2021 81.3  80.0 - 100.0 fL Final   MCH 03/06/2021 23.9 (L)  26.0 - 34.0 pg Final   MCHC 03/06/2021 29.4 (L)  30.0 - 36.0 g/dL Final   RDW 03/06/2021 14.8  11.5 - 15.5 % Final   Platelets 03/06/2021 219  150 - 400 K/uL Final   nRBC 03/06/2021 0.0  0.0 - 0.2 % Final   Performed at [facility], [place] [address] [place], [place] [address]   WBC 03/07/2021 7.6  4.0 - 10.5 K/uL Final   RBC 03/07/2021 3.01 (L)  4.22 - 5.81 MIL/uL Final   Hemoglobin 03/07/2021 7.3 (L)  13.0 - 17.0 g/dL Final   HCT 03/07/2021 24.6  (L)  39.0 - 52.0 % Final   MCV 03/07/2021 81.7  80.0 - 100.0 fL Final   MCH 03/07/2021 24.3 (L)  26.0 - 34.0 pg Final   MCHC 03/07/2021 29.7 (L)  30.0 - 36.0 g/dL Final   RDW 03/07/2021 15.0  11.5 - 15.5 % Final   Platelets 03/07/2021 266  150 - 400 K/uL Final   nRBC 03/07/2021 0.0  0.0 - 0.2 % Final   Performed at [facility], [place] [address], [place], [place] [address]   Order Confirmation 03/07/2021    Final                   Value:ORDER PROCESSED BY BLOOD BANK Performed at [facility], [place] [address] [place], [place] [address]    Hemoglobin 03/07/2021 9.2 (L)  13.0 - 17.0 g/dL Final   Comment: REPEATED TO VERIFY POST TRANSFUSION SPECIMEN DELTA CHECK NOTED    HCT 03/07/2021 30.0 (L)  39.0 - 52.0 % Final   Performed at [facility], [place] [address], [place], [place] [address]  Hospital Outpatient Visit on 02/28/2021  Component Date Value Ref Range Status   MRSA, PCR 02/28/2021 NEGATIVE  NEGATIVE Final   Staphylococcus aureus 02/28/2021 POSITIVE (A)  NEGATIVE Final   Comment: (NOTE) The Xpert SA Assay (FDA approved for NASAL specimens in patients 14 years of age and older), is one component of a comprehensive surveillance program. It is not intended to diagnose infection nor to guide or monitor treatment. Performed at [facility], [place] [address] [place], [place] [address]    WBC 02/28/2021 7.5  4.0 - 10.5 K/uL Final   RBC 02/28/2021 4.58  4.22 - 5.81 MIL/uL Final   Hemoglobin 02/28/2021 11.0 (L)  13.0 - 17.0 g/dL Final   HCT 02/28/2021 36.1 (L)  39.0 - 52.0 % Final   MCV 02/28/2021 78.8 (L)  80.0 - 100.0 fL Final   MCH 02/28/2021 24.0 (L)  26.0 - 34.0 pg Final   MCHC 02/28/2021 30.5  30.0 - 36.0 g/dL Final   RDW 02/28/2021 14.9  11.5 - 15.5 % Final   Platelets 02/28/2021 335  150 - 400 K/uL Final   nRBC 02/28/2021 0.0  0.0 - 0.2 % Final   Performed at [facility], [place]  [address] [place], [place] [address]   Sodium 02/28/2021 137  135 - 145 mmol/L Final   Potassium 02/28/2021 4.0  3.5 - 5.1 mmol/L Final   Chloride  02/28/2021 103  98 - 111 mmol/L Final   CO2 02/28/2021 27  22 - 32 mmol/L Final   Glucose, Bld 02/28/2021 116 (H)  70 - 99 mg/dL Final   Glucose reference range applies only to samples taken after fasting for at least 8 hours.   BUN 02/28/2021 16  6 - 20 mg/dL Final   Creatinine, Ser 02/28/2021 0.79  0.61 - 1.24 mg/dL Final   Calcium 02/28/2021 8.7 (L)  8.9 - 10.3 mg/dL Final   Total Protein 02/28/2021 7.1  6.5 - 8.1 g/dL Final   Albumin 02/28/2021 3.9  3.5 - 5.0 g/dL Final   AST 02/28/2021 17  15 - 41 U/L Final   ALT 02/28/2021 15  0 - 44 U/L Final   Alkaline Phosphatase 02/28/2021 76  38 - 126 U/L Final   Total Bilirubin 02/28/2021 0.5  0.3 - 1.2 mg/dL Final   GFR, Estimated 02/28/2021 >60  >60 mL/min Final   Comment: (NOTE) Calculated using the CKD-EPI Creatinine Equation (2021)    Anion gap 02/28/2021 7  5 - 15 Final   Performed at [facility], [place] [address], [place], [place] [address]   ABO/RH(D) 02/28/2021 A POS   Final   Antibody Screen 02/28/2021 NEG   Final   Sample Expiration 02/28/2021 03/07/2021,2359   Final   Extend sample reason 02/28/2021 NO TRANSFUSIONS OR PREGNANCY IN THE PAST 3 MONTHS   Final   Unit Number 02/28/2021 [id number]   Final   Blood Component Type 02/28/2021 RED CELLS,LR   Final   Unit division 02/28/2021 00   Final   Status of Unit 02/28/2021 ISSUED,FINAL   Final   Transfusion Status 02/28/2021 OK TO TRANSFUSE   Final   Crossmatch Result 02/28/2021    Final                   Value:Compatible Performed at [facility], [place] [address] [place], [place] [address]    ISSUE DATE / TIME 02/28/2021 [id number]   Final   Blood Product Unit Number 02/28/2021 [id number]   Final   PRODUCT CODE 02/28/2021 [id number]   Final   Unit Type and Rh 02/28/2021 6200   Final    Blood Product Expiration Date 02/28/2021 [id number]   Final  Hospital Outpatient Visit on 02/28/2021  Component Date Value Ref Range Status   SARS Coronavirus 2 02/28/2021 NEGATIVE  NEGATIVE Final   Comment: (NOTE) SARS-CoV-2 target nucleic acids are NOT DETECTED.  The SARS-CoV-2 RNA is generally detectable in upper and lower respiratory specimens during the acute phase of infection. Negative results do not preclude SARS-CoV-2 infection, do not rule out co-infections with other pathogens, and should not be used as the sole basis for treatment or other patient management decisions. Negative results must be combined with clinical observations, patient history, and epidemiological information. The expected result is Negative.  Fact Sheet for Patients: SugarRoll.be  Fact Sheet for Healthcare Providers: https://www.woods-mathews.com/  This test is not yet approved or cleared by the [place] FDA and  has been authorized for detection and/or diagnosis of SARS-CoV-2 by FDA under an Emergency Use Authorization (EUA). This EUA will remain  in effect (meaning this test can be used) for the duration of the COVID-19 declaration under Se                          ction 564(b)(1) of the Act, 21 U.S.C. section 360bbb-3(b)(1), unless the authorization is  terminated or revoked sooner.  Performed at [facility], [place] [address] [place], [place] [address]      X-Rays:No results found.  EKG: Orders placed or performed during the hospital encounter of 08/31/20   EKG 12-Lead   EKG 12-Lead   ED EKG   ED EKG   EKG     Hospital Course: Andrew Haley is a 56 y.o. who was admitted to [facility]. They were brought to the operating room on 03/04/2021 and underwent Procedure(s): [place].  Patient tolerated the procedure well and was later transferred to the recovery room and then to the orthopaedic floor for postoperative care.  They were given PO and IV analgesics for pain control following their surgery. They were given 24 hours of postoperative antibiotics of  Anti-infectives (From admission, onward)    Start     Dose/Rate Route Frequency Ordered Stop   03/04/21 2000  ceFAZolin (ANCEF) IVPB 2g/100 mL premix        2 g 200 mL/hr over 30 Minutes Intravenous Every 6 hours 03/04/21 1849 03/05/21 0328   03/04/21 1545  vancomycin (VANCOCIN) powder  Status:  Discontinued          As needed 03/04/21 1545 03/04/21 1843   03/04/21 1200  ceFAZolin (ANCEF) IVPB 2g/100 mL premix        2 g 200 mL/hr over 30 Minutes Intravenous On call to O.R. 03/04/21 1146 03/04/21 1403      and started on DVT prophylaxis in the form of Aspirin.   PT and OT were ordered for total joint protocol. Discharge planning consulted to help with postop disposition and equipment needs. Patient had a fair night on the evening of surgery. They started to get up OOB with therapy on POD #1.  Continued to work with therapy into POD #2. On POD #3, patient noted to have Hgb stable at 7.3 related to ABLA. We gave 1 unit PRBC.   Pt was seen during rounds on day three and was ready to go home pending progress with therapy. Pt worked with therapy for one additional session and was meeting their goals. He was discharged to home later that day in stable condition.  Diet: Regular diet Activity: PWB Follow-up: in 2 weeks Disposition: Home Discharged Condition: good   Discharge Instructions     Call MD / Call 911   Complete by: As directed    If you experience chest pain or shortness of breath, CALL 911 and be transported to the hospital emergency room.  If you develope a fever above 101 F, pus (white drainage) or increased drainage or redness at the wound, or calf pain, call your surgeon's office.   Change dressing   Complete by: As directed    Maintain surgical dressing until follow up in the clinic. If the edges start to pull up, may reinforce with tape. If  the dressing is no longer working, may remove and cover with gauze and tape, but must keep the area dry and clean.  Call with any questions or concerns.   Constipation Prevention   Complete by: As directed    Drink plenty of fluids.  Prune juice may be helpful.  You may use a stool softener, such as Colace (over the counter) 100 mg twice a day.  Use MiraLax (over the counter) for constipation as needed.   Diet - low sodium heart healthy   Complete by: As directed    Increase activity slowly as tolerated  Complete by: As directed    Partial weight bearing with assist device as directed.   Post-operative opioid taper instructions:   Complete by: As directed    POST-OPERATIVE OPIOID TAPER INSTRUCTIONS: It is important to wean off of your opioid medication as soon as possible. If you do not need pain medication after your surgery it is ok to stop day one. Opioids include: Codeine, Hydrocodone(Norco, Vicodin), Oxycodone(Percocet, oxycontin) and hydromorphone amongst others.  Long term and even short term use of opiods can cause: Increased pain response Dependence Constipation Depression Respiratory depression And more.  Withdrawal symptoms can include Flu like symptoms Nausea, vomiting And more Techniques to manage these symptoms Hydrate well Eat regular healthy meals Stay active Use relaxation techniques(deep breathing, meditating, yoga) Do Not substitute Alcohol to help with tapering If you have been on opioids for less than two weeks and do not have pain than it is ok to stop all together.  Plan to wean off of opioids This plan should start within one week post op of your joint replacement. Maintain the same interval or time between taking each dose and first decrease the dose.  Cut the total daily intake of opioids by one tablet each day Next start to increase the time between doses. The last dose that should be eliminated is the evening dose.      TED hose   Complete by: As  directed    Use stockings (TED hose) for 2 weeks on both leg(s).  You may remove them at night for sleeping.      Allergies as of 03/07/2021   No Known Allergies      Medication List     STOP taking these medications    daptomycin  IVPB Commonly known as: CUBICIN   diclofenac 75 MG EC tablet Commonly known as: VOLTAREN   oxyCODONE-acetaminophen 10-325 MG tablet Commonly known as: PERCOCET       TAKE these medications    acetaminophen 325 MG tablet Commonly known as: TYLENOL Take 3 tablets (975 mg total) by mouth every 6 (six) hours.   aspirin 81 MG chewable tablet Chew 1 tablet (81 mg total) by mouth 2 (two) times daily for 28 days. For prevention of blood clot after surgery   cyclobenzaprine 10 MG tablet Commonly known as: FLEXERIL Take 1 tablet (10 mg total) by mouth 3 (three) times daily as needed for muscle spasms.   docusate sodium 100 MG capsule Commonly known as: COLACE Take 1 capsule (100 mg total) by mouth 2 (two) times daily.   ferrous sulfate 325 (65 FE) MG tablet Take 1 tablet (325 mg total) by mouth 3 (three) times daily after meals for 14 days.   losartan 50 MG tablet Commonly known as: COZAAR Take 50 mg by mouth daily.   losartan-hydrochlorothiazide 50-12.5 MG tablet Commonly known as: Hyzaar Take 1 tablet by mouth daily.   naloxone 4 MG/0.1ML Liqd nasal spray kit Commonly known as: NARCAN Spray into nostrils if patient shows signs of opioid overdose, then call 911   oxyCODONE 5 MG immediate release tablet Commonly known as: Roxicodone Take 1-3 tablets (5-15 mg total) by mouth every 4 (four) hours as needed for severe pain. Do NOT take with normal Oxycodone prescription at home.   polyethylene glycol 17 g packet Commonly known as: MIRALAX / GLYCOLAX Take 17 g by mouth daily as needed for mild constipation.               Discharge Care Instructions  (From  admission, onward)           Start     Ordered   03/07/21 0000   Change dressing       Comments: Maintain surgical dressing until follow up in the clinic. If the edges start to pull up, may reinforce with tape. If the dressing is no longer working, may remove and cover with gauze and tape, but must keep the area dry and clean.  Call with any questions or concerns.   03/07/21 0730            Follow-up Information     Paralee Cancel, MD. Schedule an appointment as soon as possible for a visit in 2 week(s).   Specialty: Orthopedic Surgery Contact information: [address] [place] [address] [phone]                 Signed: Griffith Citron, PA-C Orthopedic Surgery 03/21/2021, 10:25 AM

## 2021-03-22 DIAGNOSIS — M6281 Muscle weakness (generalized): Secondary | ICD-10-CM | POA: Diagnosis not present

## 2021-03-22 DIAGNOSIS — Z96651 Presence of right artificial knee joint: Secondary | ICD-10-CM | POA: Diagnosis not present

## 2021-03-22 DIAGNOSIS — Z471 Aftercare following joint replacement surgery: Secondary | ICD-10-CM | POA: Diagnosis not present

## 2021-03-22 DIAGNOSIS — R2689 Other abnormalities of gait and mobility: Secondary | ICD-10-CM | POA: Diagnosis not present

## 2021-03-22 DIAGNOSIS — M25661 Stiffness of right knee, not elsewhere classified: Secondary | ICD-10-CM | POA: Diagnosis not present

## 2021-03-25 DIAGNOSIS — Z4789 Encounter for other orthopedic aftercare: Secondary | ICD-10-CM | POA: Diagnosis not present

## 2021-03-25 DIAGNOSIS — Z96651 Presence of right artificial knee joint: Secondary | ICD-10-CM | POA: Diagnosis not present

## 2021-03-25 DIAGNOSIS — Z471 Aftercare following joint replacement surgery: Secondary | ICD-10-CM | POA: Diagnosis not present

## 2021-03-26 DIAGNOSIS — G894 Chronic pain syndrome: Secondary | ICD-10-CM | POA: Diagnosis not present

## 2021-03-27 DIAGNOSIS — Z96651 Presence of right artificial knee joint: Secondary | ICD-10-CM | POA: Diagnosis not present

## 2021-03-27 DIAGNOSIS — R2689 Other abnormalities of gait and mobility: Secondary | ICD-10-CM | POA: Diagnosis not present

## 2021-03-27 DIAGNOSIS — M6281 Muscle weakness (generalized): Secondary | ICD-10-CM | POA: Diagnosis not present

## 2021-03-27 DIAGNOSIS — M25661 Stiffness of right knee, not elsewhere classified: Secondary | ICD-10-CM | POA: Diagnosis not present

## 2021-03-27 DIAGNOSIS — Z471 Aftercare following joint replacement surgery: Secondary | ICD-10-CM | POA: Diagnosis not present

## 2021-04-01 DIAGNOSIS — Z96651 Presence of right artificial knee joint: Secondary | ICD-10-CM | POA: Diagnosis not present

## 2021-04-01 DIAGNOSIS — M25661 Stiffness of right knee, not elsewhere classified: Secondary | ICD-10-CM | POA: Diagnosis not present

## 2021-04-01 DIAGNOSIS — M6281 Muscle weakness (generalized): Secondary | ICD-10-CM | POA: Diagnosis not present

## 2021-04-01 DIAGNOSIS — Z471 Aftercare following joint replacement surgery: Secondary | ICD-10-CM | POA: Diagnosis not present

## 2021-04-01 DIAGNOSIS — R2689 Other abnormalities of gait and mobility: Secondary | ICD-10-CM | POA: Diagnosis not present

## 2021-04-03 DIAGNOSIS — M25661 Stiffness of right knee, not elsewhere classified: Secondary | ICD-10-CM | POA: Diagnosis not present

## 2021-04-03 DIAGNOSIS — R2689 Other abnormalities of gait and mobility: Secondary | ICD-10-CM | POA: Diagnosis not present

## 2021-04-03 DIAGNOSIS — Z471 Aftercare following joint replacement surgery: Secondary | ICD-10-CM | POA: Diagnosis not present

## 2021-04-03 DIAGNOSIS — M6281 Muscle weakness (generalized): Secondary | ICD-10-CM | POA: Diagnosis not present

## 2021-04-03 DIAGNOSIS — Z96651 Presence of right artificial knee joint: Secondary | ICD-10-CM | POA: Diagnosis not present

## 2021-04-05 DIAGNOSIS — Z471 Aftercare following joint replacement surgery: Secondary | ICD-10-CM | POA: Diagnosis not present

## 2021-04-05 DIAGNOSIS — Z96651 Presence of right artificial knee joint: Secondary | ICD-10-CM | POA: Diagnosis not present

## 2021-04-05 DIAGNOSIS — M6281 Muscle weakness (generalized): Secondary | ICD-10-CM | POA: Diagnosis not present

## 2021-04-05 DIAGNOSIS — M25661 Stiffness of right knee, not elsewhere classified: Secondary | ICD-10-CM | POA: Diagnosis not present

## 2021-04-05 DIAGNOSIS — R2689 Other abnormalities of gait and mobility: Secondary | ICD-10-CM | POA: Diagnosis not present

## 2021-04-08 DIAGNOSIS — M25661 Stiffness of right knee, not elsewhere classified: Secondary | ICD-10-CM | POA: Diagnosis not present

## 2021-04-08 DIAGNOSIS — Z96651 Presence of right artificial knee joint: Secondary | ICD-10-CM | POA: Diagnosis not present

## 2021-04-08 DIAGNOSIS — Z471 Aftercare following joint replacement surgery: Secondary | ICD-10-CM | POA: Diagnosis not present

## 2021-04-08 DIAGNOSIS — M6281 Muscle weakness (generalized): Secondary | ICD-10-CM | POA: Diagnosis not present

## 2021-04-08 DIAGNOSIS — R2689 Other abnormalities of gait and mobility: Secondary | ICD-10-CM | POA: Diagnosis not present

## 2021-04-10 DIAGNOSIS — R2689 Other abnormalities of gait and mobility: Secondary | ICD-10-CM | POA: Diagnosis not present

## 2021-04-10 DIAGNOSIS — Z96651 Presence of right artificial knee joint: Secondary | ICD-10-CM | POA: Diagnosis not present

## 2021-04-10 DIAGNOSIS — M25661 Stiffness of right knee, not elsewhere classified: Secondary | ICD-10-CM | POA: Diagnosis not present

## 2021-04-10 DIAGNOSIS — Z471 Aftercare following joint replacement surgery: Secondary | ICD-10-CM | POA: Diagnosis not present

## 2021-04-10 DIAGNOSIS — M6281 Muscle weakness (generalized): Secondary | ICD-10-CM | POA: Diagnosis not present

## 2021-04-17 DIAGNOSIS — M25661 Stiffness of right knee, not elsewhere classified: Secondary | ICD-10-CM | POA: Diagnosis not present

## 2021-04-17 DIAGNOSIS — Z96651 Presence of right artificial knee joint: Secondary | ICD-10-CM | POA: Diagnosis not present

## 2021-04-17 DIAGNOSIS — R2689 Other abnormalities of gait and mobility: Secondary | ICD-10-CM | POA: Diagnosis not present

## 2021-04-17 DIAGNOSIS — Z471 Aftercare following joint replacement surgery: Secondary | ICD-10-CM | POA: Diagnosis not present

## 2021-04-17 DIAGNOSIS — M6281 Muscle weakness (generalized): Secondary | ICD-10-CM | POA: Diagnosis not present

## 2021-04-19 DIAGNOSIS — M170 Bilateral primary osteoarthritis of knee: Secondary | ICD-10-CM | POA: Diagnosis not present

## 2021-04-19 DIAGNOSIS — Z471 Aftercare following joint replacement surgery: Secondary | ICD-10-CM | POA: Diagnosis not present

## 2021-04-19 DIAGNOSIS — R2689 Other abnormalities of gait and mobility: Secondary | ICD-10-CM | POA: Diagnosis not present

## 2021-04-19 DIAGNOSIS — Z6833 Body mass index (BMI) 33.0-33.9, adult: Secondary | ICD-10-CM | POA: Diagnosis not present

## 2021-04-19 DIAGNOSIS — M6281 Muscle weakness (generalized): Secondary | ICD-10-CM | POA: Diagnosis not present

## 2021-04-19 DIAGNOSIS — I10 Essential (primary) hypertension: Secondary | ICD-10-CM | POA: Diagnosis not present

## 2021-04-19 DIAGNOSIS — Z96651 Presence of right artificial knee joint: Secondary | ICD-10-CM | POA: Diagnosis not present

## 2021-04-19 DIAGNOSIS — E6609 Other obesity due to excess calories: Secondary | ICD-10-CM | POA: Diagnosis not present

## 2021-04-19 DIAGNOSIS — G894 Chronic pain syndrome: Secondary | ICD-10-CM | POA: Diagnosis not present

## 2021-04-19 DIAGNOSIS — M25661 Stiffness of right knee, not elsewhere classified: Secondary | ICD-10-CM | POA: Diagnosis not present

## 2021-04-19 DIAGNOSIS — T84012A Broken internal right knee prosthesis, initial encounter: Secondary | ICD-10-CM | POA: Diagnosis not present

## 2021-04-24 DIAGNOSIS — M25661 Stiffness of right knee, not elsewhere classified: Secondary | ICD-10-CM | POA: Diagnosis not present

## 2021-04-24 DIAGNOSIS — Z96651 Presence of right artificial knee joint: Secondary | ICD-10-CM | POA: Diagnosis not present

## 2021-04-24 DIAGNOSIS — M6281 Muscle weakness (generalized): Secondary | ICD-10-CM | POA: Diagnosis not present

## 2021-04-24 DIAGNOSIS — Z471 Aftercare following joint replacement surgery: Secondary | ICD-10-CM | POA: Diagnosis not present

## 2021-04-24 DIAGNOSIS — R2689 Other abnormalities of gait and mobility: Secondary | ICD-10-CM | POA: Diagnosis not present

## 2021-05-02 DIAGNOSIS — M13871 Other specified arthritis, right ankle and foot: Secondary | ICD-10-CM | POA: Diagnosis not present

## 2021-05-02 DIAGNOSIS — Z96651 Presence of right artificial knee joint: Secondary | ICD-10-CM | POA: Diagnosis not present

## 2021-05-02 DIAGNOSIS — Z471 Aftercare following joint replacement surgery: Secondary | ICD-10-CM | POA: Diagnosis not present

## 2021-05-10 DIAGNOSIS — Z96651 Presence of right artificial knee joint: Secondary | ICD-10-CM | POA: Diagnosis not present

## 2021-05-10 DIAGNOSIS — I10 Essential (primary) hypertension: Secondary | ICD-10-CM | POA: Diagnosis not present

## 2021-05-10 DIAGNOSIS — F4542 Pain disorder with related psychological factors: Secondary | ICD-10-CM | POA: Diagnosis not present

## 2021-05-10 DIAGNOSIS — G894 Chronic pain syndrome: Secondary | ICD-10-CM | POA: Diagnosis not present

## 2021-05-10 DIAGNOSIS — M25661 Stiffness of right knee, not elsewhere classified: Secondary | ICD-10-CM | POA: Diagnosis not present

## 2021-05-10 DIAGNOSIS — Z471 Aftercare following joint replacement surgery: Secondary | ICD-10-CM | POA: Diagnosis not present

## 2021-05-10 DIAGNOSIS — R2689 Other abnormalities of gait and mobility: Secondary | ICD-10-CM | POA: Diagnosis not present

## 2021-05-10 DIAGNOSIS — M6281 Muscle weakness (generalized): Secondary | ICD-10-CM | POA: Diagnosis not present

## 2021-05-13 DIAGNOSIS — M6281 Muscle weakness (generalized): Secondary | ICD-10-CM | POA: Diagnosis not present

## 2021-05-13 DIAGNOSIS — R2689 Other abnormalities of gait and mobility: Secondary | ICD-10-CM | POA: Diagnosis not present

## 2021-05-13 DIAGNOSIS — Z471 Aftercare following joint replacement surgery: Secondary | ICD-10-CM | POA: Diagnosis not present

## 2021-05-13 DIAGNOSIS — M25661 Stiffness of right knee, not elsewhere classified: Secondary | ICD-10-CM | POA: Diagnosis not present

## 2021-05-13 DIAGNOSIS — Z96651 Presence of right artificial knee joint: Secondary | ICD-10-CM | POA: Diagnosis not present

## 2021-05-17 DIAGNOSIS — R2689 Other abnormalities of gait and mobility: Secondary | ICD-10-CM | POA: Diagnosis not present

## 2021-05-17 DIAGNOSIS — Z471 Aftercare following joint replacement surgery: Secondary | ICD-10-CM | POA: Diagnosis not present

## 2021-05-17 DIAGNOSIS — Z96651 Presence of right artificial knee joint: Secondary | ICD-10-CM | POA: Diagnosis not present

## 2021-05-17 DIAGNOSIS — M25661 Stiffness of right knee, not elsewhere classified: Secondary | ICD-10-CM | POA: Diagnosis not present

## 2021-05-17 DIAGNOSIS — M6281 Muscle weakness (generalized): Secondary | ICD-10-CM | POA: Diagnosis not present

## 2021-05-20 DIAGNOSIS — M179 Osteoarthritis of knee, unspecified: Secondary | ICD-10-CM | POA: Diagnosis not present

## 2021-05-20 DIAGNOSIS — G894 Chronic pain syndrome: Secondary | ICD-10-CM | POA: Diagnosis not present

## 2021-05-20 DIAGNOSIS — M170 Bilateral primary osteoarthritis of knee: Secondary | ICD-10-CM | POA: Diagnosis not present

## 2021-05-20 DIAGNOSIS — M1991 Primary osteoarthritis, unspecified site: Secondary | ICD-10-CM | POA: Diagnosis not present

## 2021-05-22 DIAGNOSIS — Z96651 Presence of right artificial knee joint: Secondary | ICD-10-CM | POA: Diagnosis not present

## 2021-05-22 DIAGNOSIS — M6281 Muscle weakness (generalized): Secondary | ICD-10-CM | POA: Diagnosis not present

## 2021-05-22 DIAGNOSIS — Z471 Aftercare following joint replacement surgery: Secondary | ICD-10-CM | POA: Diagnosis not present

## 2021-05-22 DIAGNOSIS — R2689 Other abnormalities of gait and mobility: Secondary | ICD-10-CM | POA: Diagnosis not present

## 2021-05-22 DIAGNOSIS — M25661 Stiffness of right knee, not elsewhere classified: Secondary | ICD-10-CM | POA: Diagnosis not present

## 2021-06-09 DIAGNOSIS — G894 Chronic pain syndrome: Secondary | ICD-10-CM | POA: Diagnosis not present

## 2021-06-09 DIAGNOSIS — F4542 Pain disorder with related psychological factors: Secondary | ICD-10-CM | POA: Diagnosis not present

## 2021-06-09 DIAGNOSIS — I10 Essential (primary) hypertension: Secondary | ICD-10-CM | POA: Diagnosis not present

## 2021-06-17 DIAGNOSIS — M25561 Pain in right knee: Secondary | ICD-10-CM | POA: Diagnosis not present

## 2021-06-17 DIAGNOSIS — M170 Bilateral primary osteoarthritis of knee: Secondary | ICD-10-CM | POA: Diagnosis not present

## 2021-06-17 DIAGNOSIS — I10 Essential (primary) hypertension: Secondary | ICD-10-CM | POA: Diagnosis not present

## 2021-06-17 DIAGNOSIS — G894 Chronic pain syndrome: Secondary | ICD-10-CM | POA: Diagnosis not present

## 2021-07-18 DIAGNOSIS — Z6834 Body mass index (BMI) 34.0-34.9, adult: Secondary | ICD-10-CM | POA: Diagnosis not present

## 2021-07-18 DIAGNOSIS — M179 Osteoarthritis of knee, unspecified: Secondary | ICD-10-CM | POA: Diagnosis not present

## 2021-07-18 DIAGNOSIS — I10 Essential (primary) hypertension: Secondary | ICD-10-CM | POA: Diagnosis not present

## 2021-07-18 DIAGNOSIS — M5431 Sciatica, right side: Secondary | ICD-10-CM | POA: Diagnosis not present

## 2021-07-18 DIAGNOSIS — E6609 Other obesity due to excess calories: Secondary | ICD-10-CM | POA: Diagnosis not present

## 2021-07-18 DIAGNOSIS — F4542 Pain disorder with related psychological factors: Secondary | ICD-10-CM | POA: Diagnosis not present

## 2021-07-18 DIAGNOSIS — G894 Chronic pain syndrome: Secondary | ICD-10-CM | POA: Diagnosis not present

## 2021-08-15 DIAGNOSIS — G894 Chronic pain syndrome: Secondary | ICD-10-CM | POA: Diagnosis not present

## 2021-08-15 DIAGNOSIS — I10 Essential (primary) hypertension: Secondary | ICD-10-CM | POA: Diagnosis not present

## 2021-08-15 DIAGNOSIS — M1991 Primary osteoarthritis, unspecified site: Secondary | ICD-10-CM | POA: Diagnosis not present

## 2021-08-15 DIAGNOSIS — G4700 Insomnia, unspecified: Secondary | ICD-10-CM | POA: Diagnosis not present

## 2021-09-11 DIAGNOSIS — M25571 Pain in right ankle and joints of right foot: Secondary | ICD-10-CM | POA: Diagnosis not present

## 2021-09-11 DIAGNOSIS — Z96651 Presence of right artificial knee joint: Secondary | ICD-10-CM | POA: Diagnosis not present

## 2021-09-11 DIAGNOSIS — M19071 Primary osteoarthritis, right ankle and foot: Secondary | ICD-10-CM | POA: Diagnosis not present

## 2021-09-23 DIAGNOSIS — G894 Chronic pain syndrome: Secondary | ICD-10-CM | POA: Diagnosis not present

## 2021-10-17 DIAGNOSIS — L03116 Cellulitis of left lower limb: Secondary | ICD-10-CM | POA: Diagnosis not present

## 2021-10-17 DIAGNOSIS — L259 Unspecified contact dermatitis, unspecified cause: Secondary | ICD-10-CM | POA: Diagnosis not present

## 2021-10-17 DIAGNOSIS — I10 Essential (primary) hypertension: Secondary | ICD-10-CM | POA: Diagnosis not present

## 2021-10-22 IMAGING — US US EXTREM LOW VENOUS*L*
1 series · 14 of 24 positions shown · non-contrast
Comparison: None.

CLINICAL DATA: Swelling.

EXAM:
LEFT LOWER EXTREMITY VENOUS DOPPLER ULTRASOUND
TECHNIQUE: Gray-scale sonography with compression, as well as color and duplex
ultrasound, were performed to evaluate the deep venous system(s)
from the level of the common femoral vein through the popliteal and
proximal calf veins.

[Series 1: us venous img lower uni left (dvt) · portal-venous · 14 of 40 slices shown]
[im 1/40]
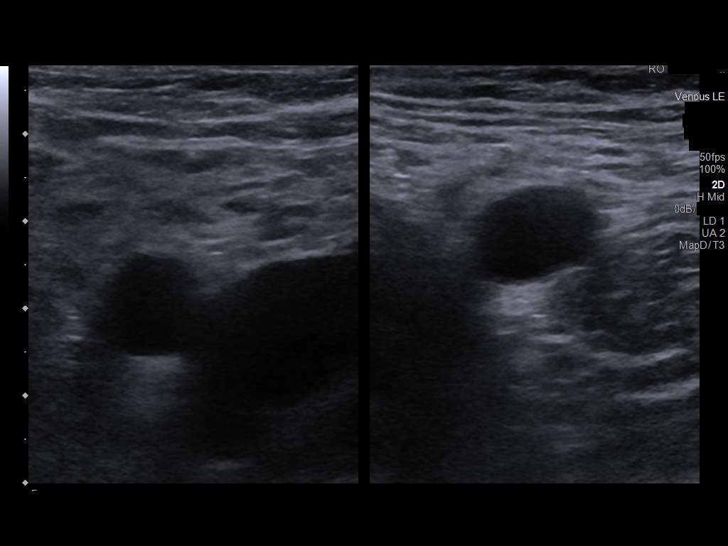
[im 4/40]
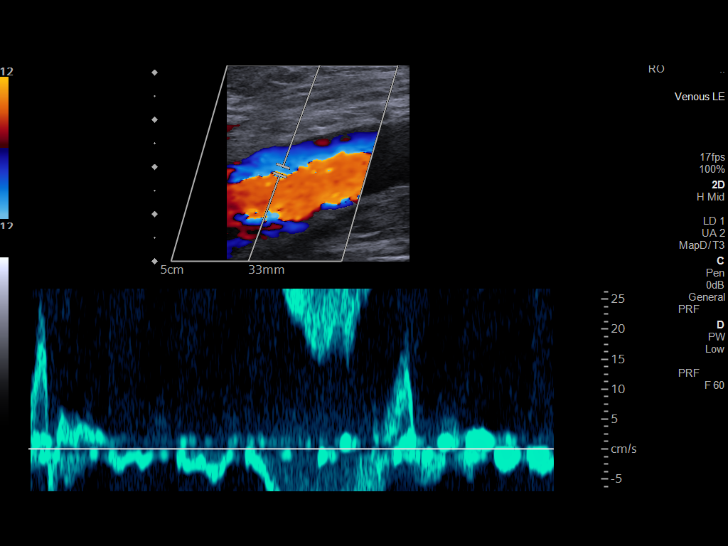
[im 7/40]
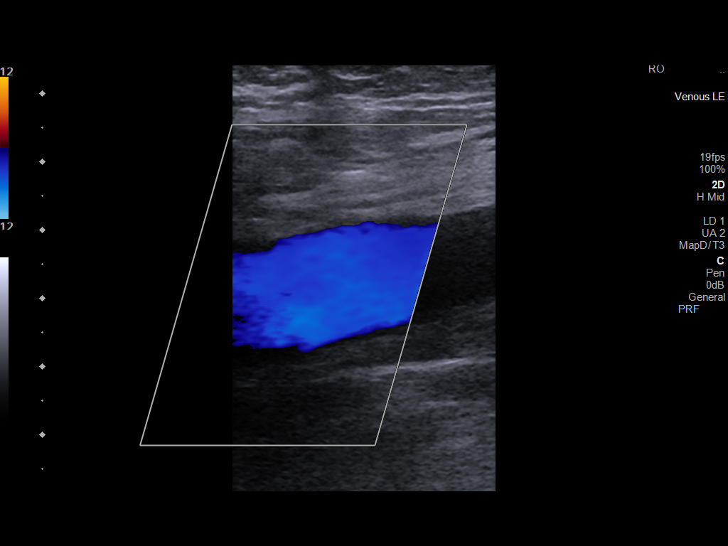
[im 11/40]
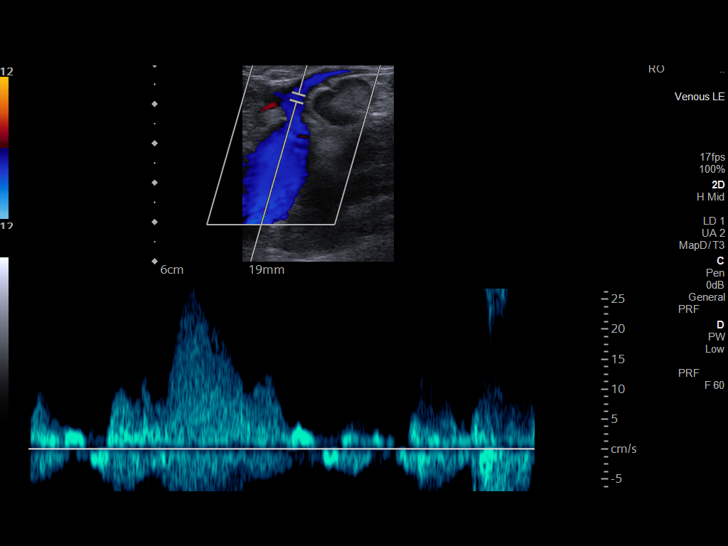
[im 12/40]
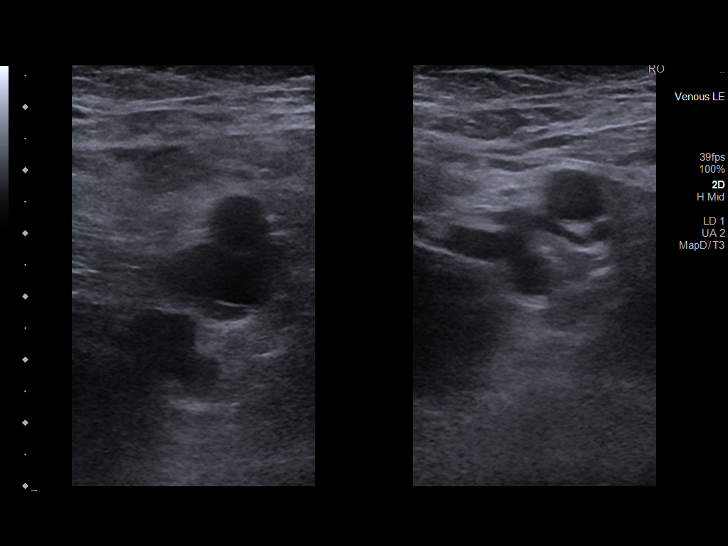
[im 16/40]
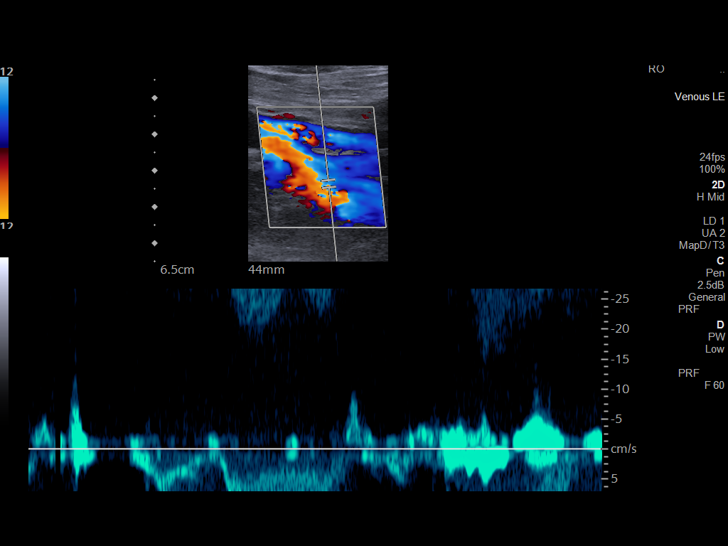
[im 19/40]
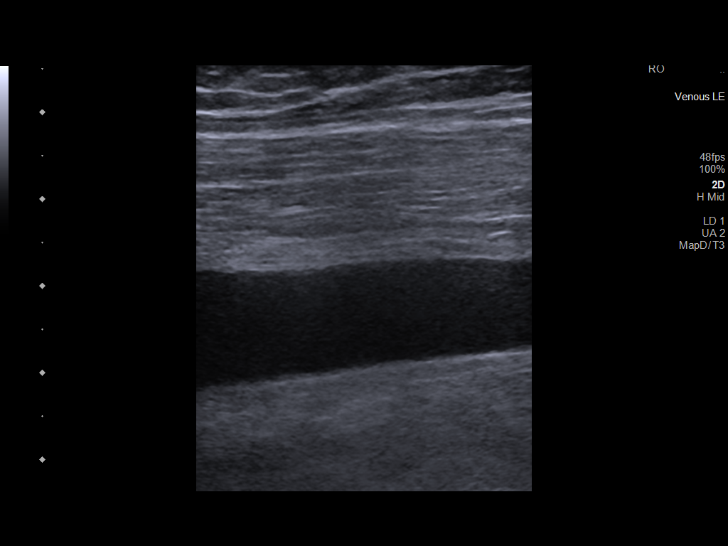
[im 21/40]
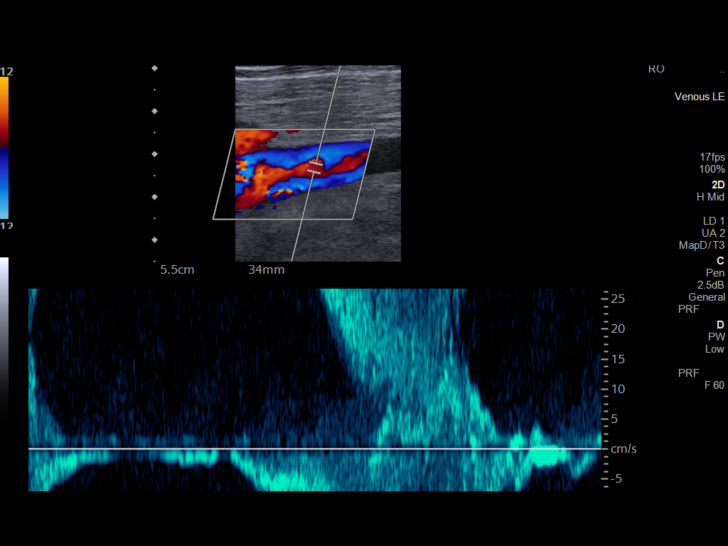
[im 24/40]
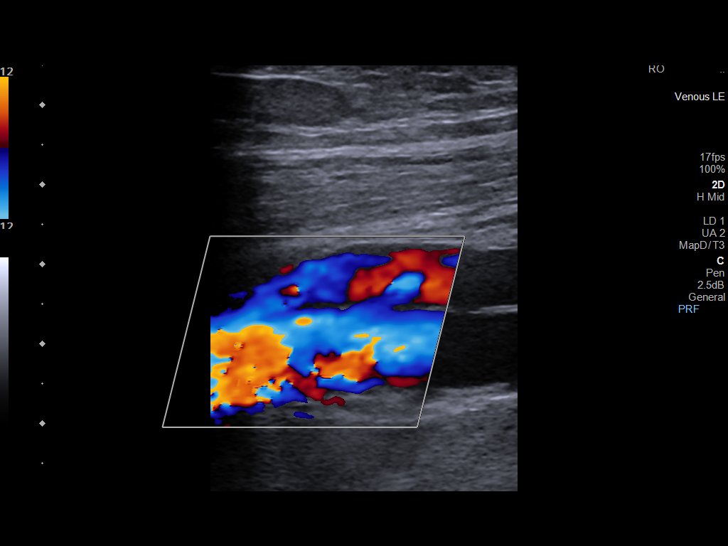
[im 28/40]
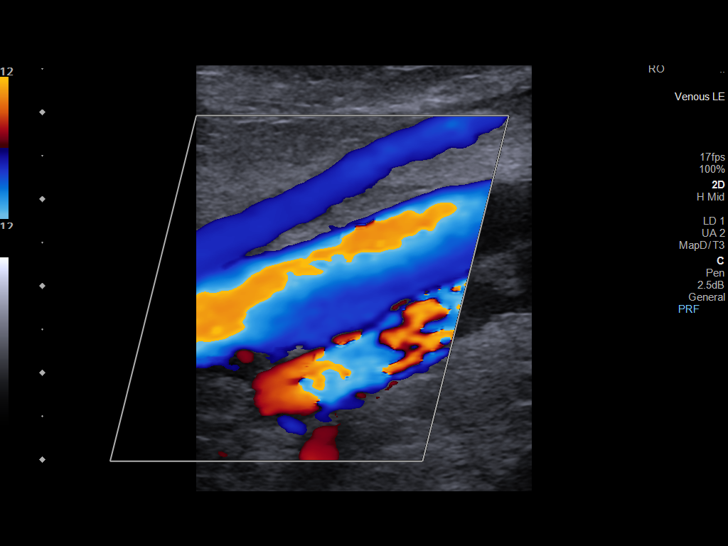
[im 31/40]
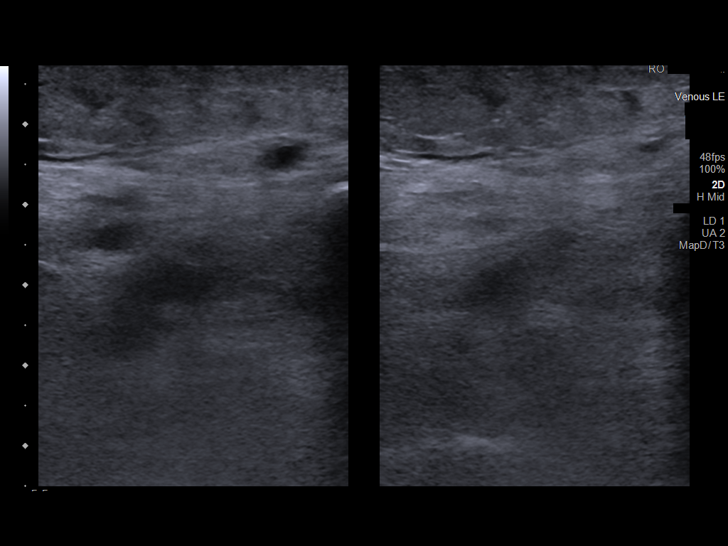
[im 33/40]
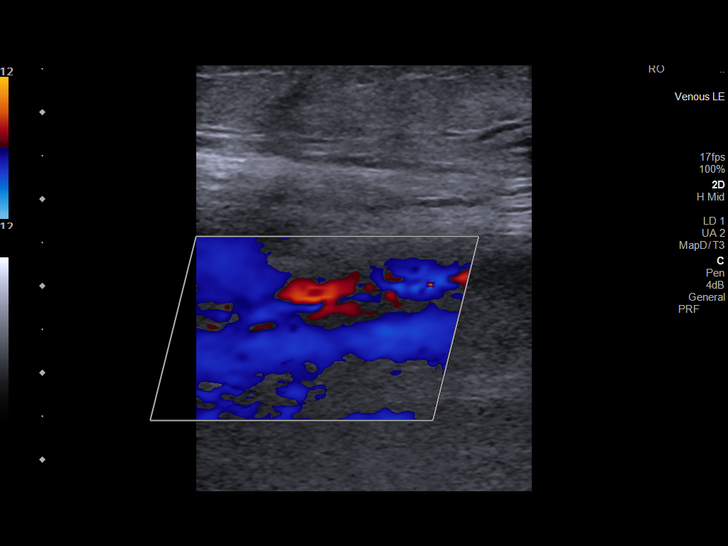
[im 36/40]
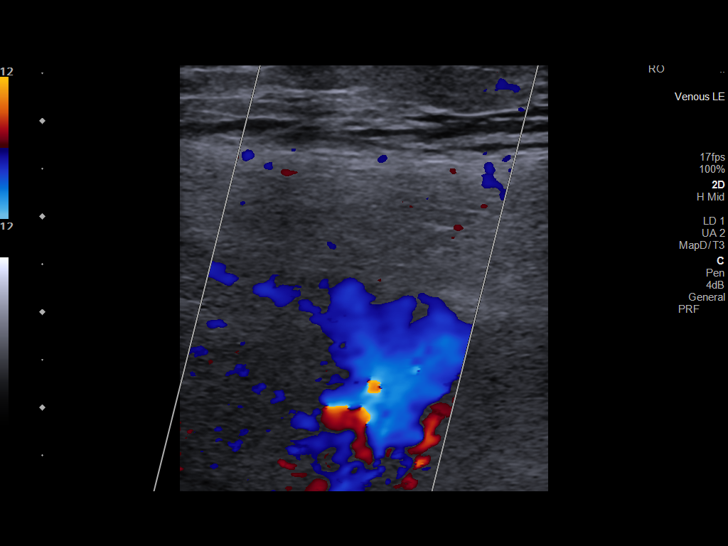
[im 40/40]
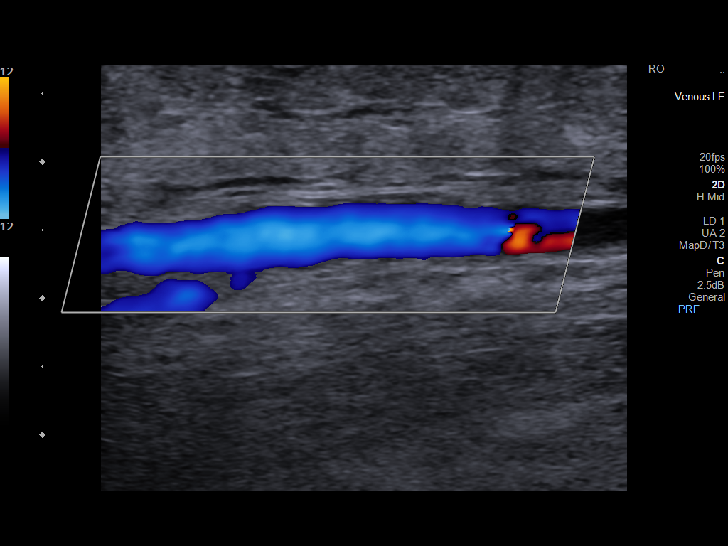

[14 of 24 positions shown; findings below may reference images not displayed]

FINDINGS: VENOUS

Normal compressibility of the common femoral, superficial femoral,
and popliteal veins, as well as the visualized calf veins.
Visualized portions of profunda femoral vein and great saphenous
vein unremarkable. No filling defects to suggest DVT on grayscale or
color Doppler imaging. Doppler waveforms show normal direction of
venous flow, normal respiratory plasticity and response to
augmentation.

Limited views of the contralateral common femoral vein are
unremarkable.
IMPRESSION: No evidence of DVT.

## 2021-10-22 IMAGING — DX DG FOOT COMPLETE 3+V*L*
3 series · 3 of 3 positions shown · non-contrast
Comparison: None.

CLINICAL DATA: Left foot pain.

EXAM:
LEFT FOOT - COMPLETE 3+ VIEW

[foot ap]
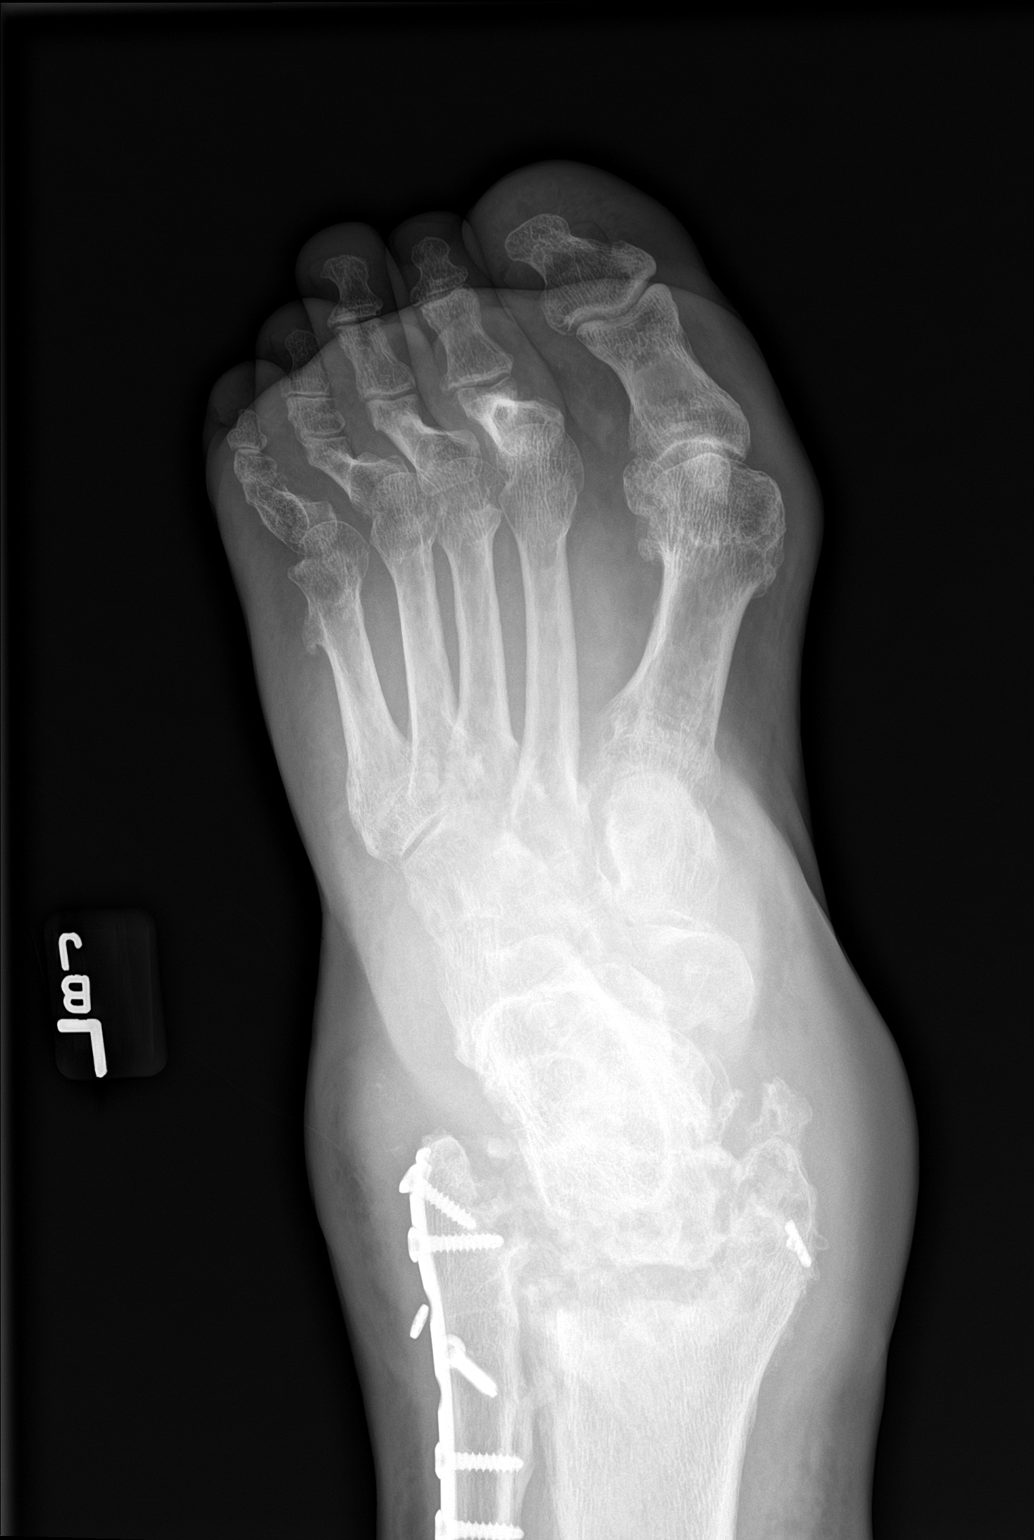

[foot obl]
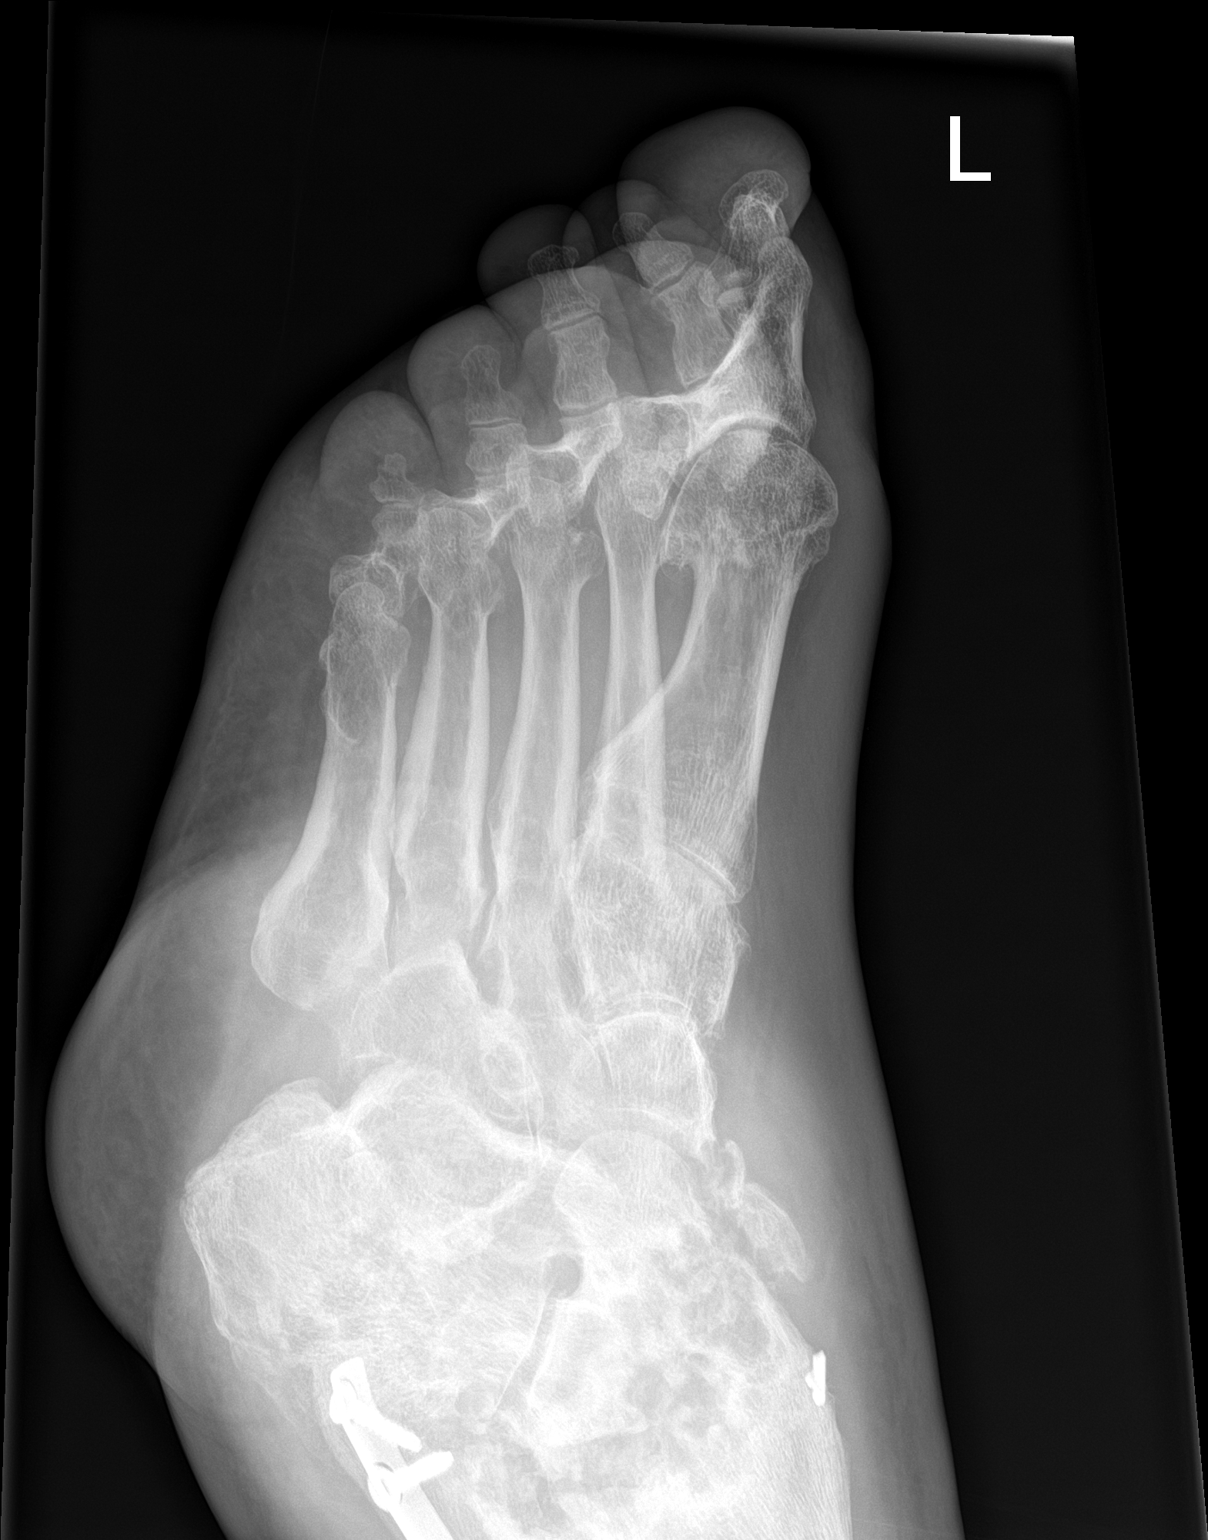

[foot lat]
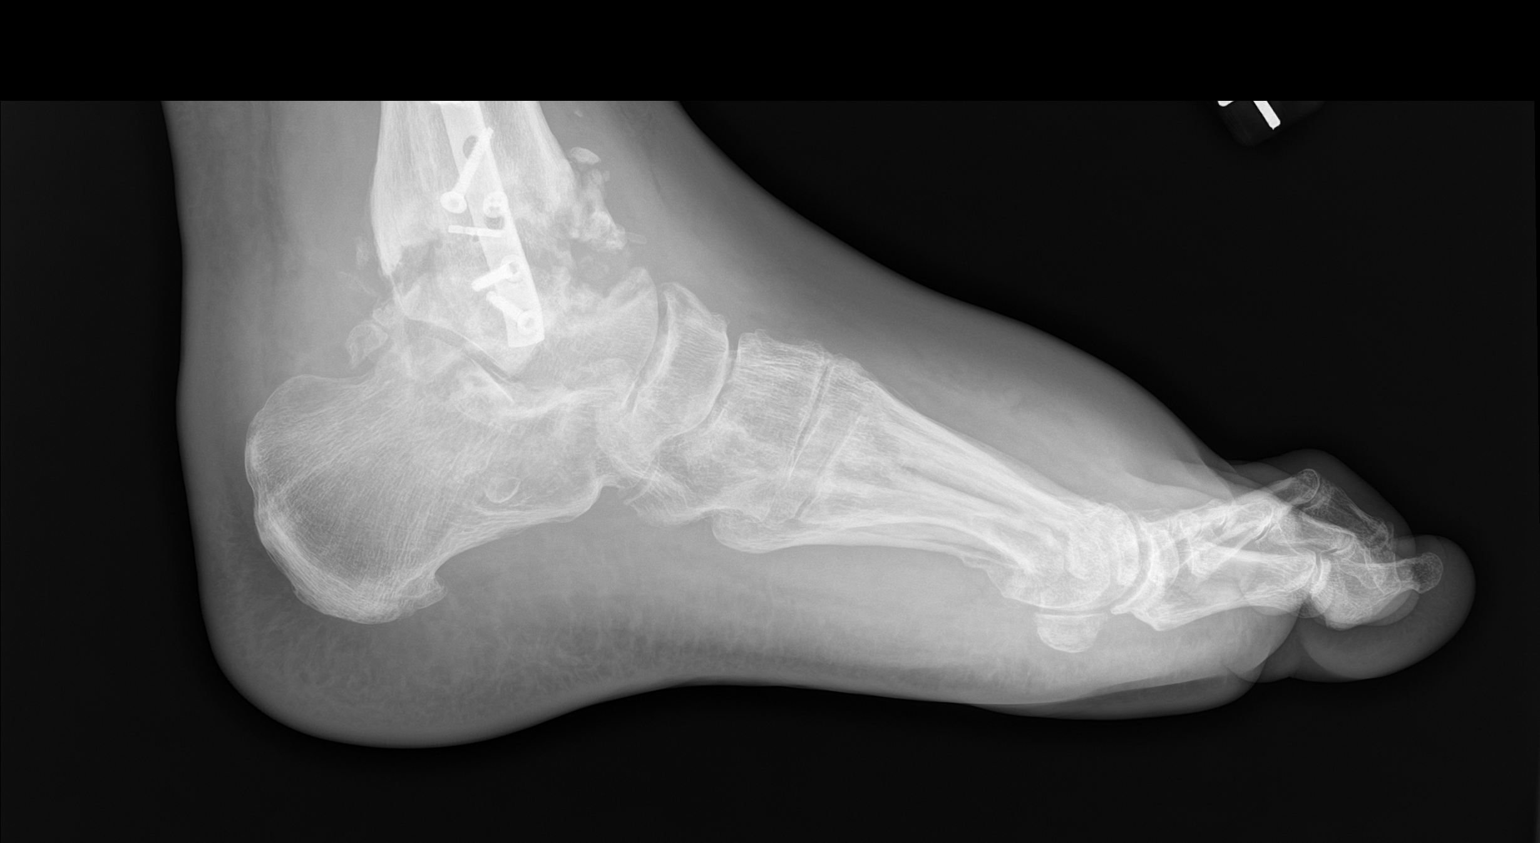

[3 of 3 positions shown; findings below may reference images not displayed]

FINDINGS: There is no evidence of an acute fracture or dislocation. A
radiopaque fixation plate and screws are seen along the left lateral
malleolus. Chronic deformities are also seen involving the distal
left tibia. The second through fifth left toes are flexed in
position and subsequently limited in evaluation. A small chronic
versus congenital deformity is seen along the lateral aspect of the
fifth left metatarsal. Mild degenerative changes are seen involving
the mid left foot. Marked severity diffuse soft tissue swelling is
seen throughout the left foot and left ankle.
IMPRESSION: 1. Chronic, degenerative and postoperative changes without an acute
osseous abnormality.
2. Diffuse soft tissue swelling.

## 2021-10-23 ENCOUNTER — Telehealth: Payer: Self-pay | Admitting: *Deleted

## 2021-10-23 NOTE — Patient Outreach (Signed)
  Care Coordination   10/23/2021 Name: Andrew Haley MRN: [id number] DOB: 1965-11-05   Care Coordination Outreach Attempts:  An unsuccessful telephone outreach was attempted today to offer the patient information about available care coordination services as a benefit of their health plan.   Follow Up Plan:  Additional outreach attempts will be made to offer the patient care coordination information and services.   Encounter Outcome:  No Answer  Care Coordination Interventions Activated:  No   Care Coordination Interventions:  No, not indicated    Chong Sicilian, BSN, RN-BC RN Care Coordinator [place]: [phone] Main #: [phone]

## 2021-10-24 DIAGNOSIS — Z6834 Body mass index (BMI) 34.0-34.9, adult: Secondary | ICD-10-CM | POA: Diagnosis not present

## 2021-10-24 DIAGNOSIS — M7989 Other specified soft tissue disorders: Secondary | ICD-10-CM | POA: Diagnosis not present

## 2021-10-24 DIAGNOSIS — F4542 Pain disorder with related psychological factors: Secondary | ICD-10-CM | POA: Diagnosis not present

## 2021-11-22 DIAGNOSIS — G894 Chronic pain syndrome: Secondary | ICD-10-CM | POA: Diagnosis not present

## 2021-11-22 DIAGNOSIS — Z6835 Body mass index (BMI) 35.0-35.9, adult: Secondary | ICD-10-CM | POA: Diagnosis not present

## 2021-11-22 DIAGNOSIS — E6609 Other obesity due to excess calories: Secondary | ICD-10-CM | POA: Diagnosis not present

## 2021-11-26 ENCOUNTER — Telehealth: Payer: Self-pay | Admitting: *Deleted

## 2021-11-26 NOTE — Patient Outreach (Signed)
  Care Coordination   11/26/2021 Name: Andrew Haley MRN: [id number] DOB: Dec 30, 1965   Care Coordination Outreach Attempts:  A second unsuccessful outreach was attempted today to offer the patient with information about available care coordination services as a benefit of their health plan.     Follow Up Plan:  Additional outreach attempts will be made to offer the patient care coordination information and services.   Encounter Outcome:  No Answer  Care Coordination Interventions Activated:  No   Care Coordination Interventions:  No, not indicated    Valente David, RN, MSN, [facility] Care Management Care Management Coordinator [phone]

## 2021-11-29 ENCOUNTER — Telehealth: Payer: Self-pay | Admitting: *Deleted

## 2021-11-29 NOTE — Patient Outreach (Signed)
  Care Coordination   11/29/2021 Name: Andrew Haley MRN: [id number] DOB: [birth year]   Care Coordination Outreach Attempts:  A third unsuccessful outreach was attempted today to offer the patient with information about available care coordination services as a benefit of their health plan.   Follow Up Plan:  No further outreach attempts will be made at this time. We have been unable to contact the patient to offer or enroll patient in care coordination services  Encounter Outcome:  No Answer  Care Coordination Interventions Activated:  No   Care Coordination Interventions:  No, not indicated    Valente David, RN, MSN, [facility] Care Management Care Management Coordinator [phone]
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
## 2021-12-10 DIAGNOSIS — G894 Chronic pain syndrome: Secondary | ICD-10-CM | POA: Diagnosis not present

## 2021-12-10 DIAGNOSIS — I10 Essential (primary) hypertension: Secondary | ICD-10-CM | POA: Diagnosis not present

## 2021-12-10 DIAGNOSIS — F4542 Pain disorder with related psychological factors: Secondary | ICD-10-CM | POA: Diagnosis not present

## 2021-12-20 DIAGNOSIS — E6609 Other obesity due to excess calories: Secondary | ICD-10-CM | POA: Diagnosis not present

## 2021-12-20 DIAGNOSIS — Z6833 Body mass index (BMI) 33.0-33.9, adult: Secondary | ICD-10-CM | POA: Diagnosis not present

## 2021-12-20 DIAGNOSIS — G894 Chronic pain syndrome: Secondary | ICD-10-CM | POA: Diagnosis not present

## 2021-12-20 DIAGNOSIS — I10 Essential (primary) hypertension: Secondary | ICD-10-CM | POA: Diagnosis not present

## 2022-01-01 ENCOUNTER — Inpatient Hospital Stay (HOSPITAL_COMMUNITY)
Admission: EM | Admit: 2022-01-01 | Discharge: 2022-01-10 | DRG: 917 | Discharge status: Against Medical Advice | Payer: Medicare HMO | Source: Other Acute Inpatient Hospital | Attending: Internal Medicine | Admitting: Internal Medicine

## 2022-01-01 ENCOUNTER — Inpatient Hospital Stay (HOSPITAL_COMMUNITY): Payer: Medicare HMO

## 2022-01-01 ENCOUNTER — Encounter (HOSPITAL_COMMUNITY): Payer: Self-pay | Admitting: Emergency Medicine

## 2022-01-01 ENCOUNTER — Encounter (HOSPITAL_COMMUNITY): Payer: Self-pay

## 2022-01-01 ENCOUNTER — Emergency Department (HOSPITAL_COMMUNITY): Payer: Medicare HMO

## 2022-01-01 ENCOUNTER — Other Ambulatory Visit: Payer: Self-pay

## 2022-01-01 DIAGNOSIS — E8729 Other acidosis: Secondary | ICD-10-CM | POA: Diagnosis present

## 2022-01-01 DIAGNOSIS — E872 Acidosis, unspecified: Secondary | ICD-10-CM | POA: Diagnosis not present

## 2022-01-01 DIAGNOSIS — F10231 Alcohol dependence with withdrawal delirium: Secondary | ICD-10-CM | POA: Diagnosis not present

## 2022-01-01 DIAGNOSIS — J189 Pneumonia, unspecified organism: Secondary | ICD-10-CM | POA: Diagnosis not present

## 2022-01-01 DIAGNOSIS — G9341 Metabolic encephalopathy: Secondary | ICD-10-CM | POA: Diagnosis not present

## 2022-01-01 DIAGNOSIS — G4733 Obstructive sleep apnea (adult) (pediatric): Secondary | ICD-10-CM | POA: Diagnosis present

## 2022-01-01 DIAGNOSIS — I517 Cardiomegaly: Secondary | ICD-10-CM | POA: Diagnosis not present

## 2022-01-01 DIAGNOSIS — J9601 Acute respiratory failure with hypoxia: Secondary | ICD-10-CM | POA: Diagnosis not present

## 2022-01-01 DIAGNOSIS — Z452 Encounter for adjustment and management of vascular access device: Secondary | ICD-10-CM | POA: Diagnosis not present

## 2022-01-01 DIAGNOSIS — H538 Other visual disturbances: Secondary | ICD-10-CM | POA: Diagnosis not present

## 2022-01-01 DIAGNOSIS — I10 Essential (primary) hypertension: Secondary | ICD-10-CM | POA: Diagnosis present

## 2022-01-01 DIAGNOSIS — J341 Cyst and mucocele of nose and nasal sinus: Secondary | ICD-10-CM | POA: Diagnosis not present

## 2022-01-01 DIAGNOSIS — J9811 Atelectasis: Secondary | ICD-10-CM | POA: Diagnosis not present

## 2022-01-01 DIAGNOSIS — R0602 Shortness of breath: Secondary | ICD-10-CM | POA: Diagnosis not present

## 2022-01-01 DIAGNOSIS — J690 Pneumonitis due to inhalation of food and vomit: Secondary | ICD-10-CM | POA: Diagnosis not present

## 2022-01-01 DIAGNOSIS — R600 Localized edema: Secondary | ICD-10-CM | POA: Diagnosis not present

## 2022-01-01 DIAGNOSIS — Z6833 Body mass index (BMI) 33.0-33.9, adult: Secondary | ICD-10-CM | POA: Diagnosis not present

## 2022-01-01 DIAGNOSIS — J9602 Acute respiratory failure with hypercapnia: Secondary | ICD-10-CM | POA: Diagnosis not present

## 2022-01-01 DIAGNOSIS — I7121 Aneurysm of the ascending aorta, without rupture: Secondary | ICD-10-CM | POA: Diagnosis not present

## 2022-01-01 DIAGNOSIS — Z79899 Other long term (current) drug therapy: Secondary | ICD-10-CM

## 2022-01-01 DIAGNOSIS — Z8249 Family history of ischemic heart disease and other diseases of the circulatory system: Secondary | ICD-10-CM

## 2022-01-01 DIAGNOSIS — F1410 Cocaine abuse, uncomplicated: Secondary | ICD-10-CM | POA: Diagnosis present

## 2022-01-01 DIAGNOSIS — E781 Pure hyperglyceridemia: Secondary | ICD-10-CM | POA: Diagnosis present

## 2022-01-01 DIAGNOSIS — R918 Other nonspecific abnormal finding of lung field: Secondary | ICD-10-CM | POA: Diagnosis not present

## 2022-01-01 DIAGNOSIS — G40901 Epilepsy, unspecified, not intractable, with status epilepticus: Secondary | ICD-10-CM | POA: Diagnosis not present

## 2022-01-01 DIAGNOSIS — Z8546 Personal history of malignant neoplasm of prostate: Secondary | ICD-10-CM

## 2022-01-01 DIAGNOSIS — R079 Chest pain, unspecified: Secondary | ICD-10-CM | POA: Diagnosis not present

## 2022-01-01 DIAGNOSIS — J9690 Respiratory failure, unspecified, unspecified whether with hypoxia or hypercapnia: Secondary | ICD-10-CM | POA: Diagnosis not present

## 2022-01-01 DIAGNOSIS — E876 Hypokalemia: Secondary | ICD-10-CM | POA: Diagnosis not present

## 2022-01-01 DIAGNOSIS — T405X1A Poisoning by cocaine, accidental (unintentional), initial encounter: Principal | ICD-10-CM | POA: Diagnosis present

## 2022-01-01 DIAGNOSIS — A4101 Sepsis due to Methicillin susceptible Staphylococcus aureus: Secondary | ICD-10-CM | POA: Diagnosis present

## 2022-01-01 DIAGNOSIS — M25572 Pain in left ankle and joints of left foot: Secondary | ICD-10-CM | POA: Diagnosis present

## 2022-01-01 DIAGNOSIS — F10939 Alcohol use, unspecified with withdrawal, unspecified: Secondary | ICD-10-CM

## 2022-01-01 DIAGNOSIS — G928 Other toxic encephalopathy: Secondary | ICD-10-CM | POA: Diagnosis present

## 2022-01-01 DIAGNOSIS — J15211 Pneumonia due to Methicillin susceptible Staphylococcus aureus: Secondary | ICD-10-CM | POA: Diagnosis present

## 2022-01-01 DIAGNOSIS — Z1152 Encounter for screening for COVID-19: Secondary | ICD-10-CM | POA: Diagnosis not present

## 2022-01-01 DIAGNOSIS — Z5329 Procedure and treatment not carried out because of patient's decision for other reasons: Secondary | ICD-10-CM | POA: Diagnosis present

## 2022-01-01 DIAGNOSIS — R4182 Altered mental status, unspecified: Secondary | ICD-10-CM | POA: Diagnosis not present

## 2022-01-01 DIAGNOSIS — R569 Unspecified convulsions: Secondary | ICD-10-CM | POA: Diagnosis not present

## 2022-01-01 DIAGNOSIS — Z781 Physical restraint status: Secondary | ICD-10-CM

## 2022-01-01 DIAGNOSIS — G8929 Other chronic pain: Secondary | ICD-10-CM | POA: Diagnosis present

## 2022-01-01 DIAGNOSIS — R55 Syncope and collapse: Secondary | ICD-10-CM | POA: Diagnosis not present

## 2022-01-01 DIAGNOSIS — R1310 Dysphagia, unspecified: Secondary | ICD-10-CM | POA: Diagnosis present

## 2022-01-01 DIAGNOSIS — Z808 Family history of malignant neoplasm of other organs or systems: Secondary | ICD-10-CM

## 2022-01-01 DIAGNOSIS — Z4682 Encounter for fitting and adjustment of non-vascular catheter: Secondary | ICD-10-CM | POA: Diagnosis not present

## 2022-01-01 DIAGNOSIS — Z0389 Encounter for observation for other suspected diseases and conditions ruled out: Secondary | ICD-10-CM | POA: Diagnosis not present

## 2022-01-01 DIAGNOSIS — E669 Obesity, unspecified: Secondary | ICD-10-CM | POA: Diagnosis present

## 2022-01-01 DIAGNOSIS — F10931 Alcohol use, unspecified with withdrawal delirium: Secondary | ICD-10-CM

## 2022-01-01 DIAGNOSIS — Z96653 Presence of artificial knee joint, bilateral: Secondary | ICD-10-CM | POA: Diagnosis present

## 2022-01-01 DIAGNOSIS — N179 Acute kidney failure, unspecified: Secondary | ICD-10-CM | POA: Diagnosis present

## 2022-01-01 DIAGNOSIS — R0682 Tachypnea, not elsewhere classified: Secondary | ICD-10-CM | POA: Diagnosis not present

## 2022-01-01 DIAGNOSIS — J90 Pleural effusion, not elsewhere classified: Secondary | ICD-10-CM | POA: Diagnosis not present

## 2022-01-01 DIAGNOSIS — G40909 Epilepsy, unspecified, not intractable, without status epilepticus: Secondary | ICD-10-CM | POA: Diagnosis not present

## 2022-01-01 DIAGNOSIS — M778 Other enthesopathies, not elsewhere classified: Secondary | ICD-10-CM | POA: Diagnosis not present

## 2022-01-01 DIAGNOSIS — R4781 Slurred speech: Secondary | ICD-10-CM | POA: Diagnosis not present

## 2022-01-01 DIAGNOSIS — M25721 Osteophyte, right elbow: Secondary | ICD-10-CM | POA: Diagnosis not present

## 2022-01-01 DIAGNOSIS — R0689 Other abnormalities of breathing: Secondary | ICD-10-CM | POA: Diagnosis not present

## 2022-01-01 DIAGNOSIS — I712 Thoracic aortic aneurysm, without rupture, unspecified: Secondary | ICD-10-CM | POA: Diagnosis present

## 2022-01-01 DIAGNOSIS — K029 Dental caries, unspecified: Secondary | ICD-10-CM | POA: Diagnosis not present

## 2022-01-01 DIAGNOSIS — M8448XA Pathological fracture, other site, initial encounter for fracture: Secondary | ICD-10-CM | POA: Diagnosis not present

## 2022-01-01 DIAGNOSIS — R000 Tachycardia, unspecified: Secondary | ICD-10-CM | POA: Diagnosis not present

## 2022-01-01 DIAGNOSIS — F69 Unspecified disorder of adult personality and behavior: Secondary | ICD-10-CM | POA: Diagnosis not present

## 2022-01-01 DIAGNOSIS — M7989 Other specified soft tissue disorders: Secondary | ICD-10-CM | POA: Diagnosis not present

## 2022-01-01 LAB — URINALYSIS, ROUTINE W REFLEX MICROSCOPIC
Bacteria, UA: NONE SEEN
Bilirubin Urine: NEGATIVE
Glucose, UA: NEGATIVE mg/dL
Hgb urine dipstick: NEGATIVE
Ketones, ur: NEGATIVE mg/dL
Leukocytes,Ua: NEGATIVE
Nitrite: NEGATIVE
Protein, ur: 30 mg/dL — AB
Specific Gravity, Urine: >1.046 — ABNORMAL HIGH (ref 1.005–1.030)
pH: 5 (ref 5.0–8.0)

## 2022-01-01 LAB — CBC
HCT: 43.2 % (ref 39.0–52.0)
Hemoglobin: 13.5 g/dL (ref 13.0–17.0)
MCH: 28 pg (ref 26.0–34.0)
MCHC: 31.3 g/dL (ref 30.0–36.0)
MCV: 89.4 fL (ref 80.0–100.0)
Platelets: 415 10*3/uL — ABNORMAL HIGH (ref 150–400)
RBC: 4.83 MIL/uL (ref 4.22–5.81)
RDW: 12.5 % (ref 11.5–15.5)
WBC: 17 10*3/uL — ABNORMAL HIGH (ref 4.0–10.5)
nRBC: 0 % (ref 0.0–0.2)

## 2022-01-01 LAB — BASIC METABOLIC PANEL
Anion gap: 13 (ref 5–15)
BUN: 22 mg/dL — ABNORMAL HIGH (ref 6–20)
CO2: 30 mmol/L (ref 22–32)
Calcium: 9.1 mg/dL (ref 8.9–10.3)
Chloride: 97 mmol/L — ABNORMAL LOW (ref 98–111)
Creatinine, Ser: 1.28 mg/dL — ABNORMAL HIGH (ref 0.61–1.24)
GFR, Estimated: >60 mL/min (ref >60)
Glucose, Bld: 256 mg/dL — ABNORMAL HIGH (ref 70–99)
Potassium: 4.3 mmol/L (ref 3.5–5.1)
Sodium: 140 mmol/L (ref 135–145)

## 2022-01-01 LAB — RESP PANEL BY RT-PCR (FLU A&B, COVID) ARPGX2
Influenza A by PCR: NEGATIVE
Influenza B by PCR: NEGATIVE
SARS Coronavirus 2 by RT PCR: NEGATIVE

## 2022-01-01 LAB — BLOOD GAS, ARTERIAL
Acid-Base Excess: 2.7 mmol/L — ABNORMAL HIGH (ref 0.0–2.0)
Acid-Base Excess: 6.3 mmol/L — ABNORMAL HIGH (ref 0.0–2.0)
Bicarbonate: 36.2 mmol/L — ABNORMAL HIGH (ref 20.0–28.0)
Bicarbonate: 28.4 mmol/L — ABNORMAL HIGH (ref 20.0–28.0)
Drawn by: 21310
Drawn by: 21310
FIO2: 100 %
FIO2: 100 %
O2 Saturation: 90.6 %
O2 Saturation: 100 %
Patient temperature: 36
Patient temperature: 36.7
pCO2 arterial: 117 mmHg (ref 32–48)
pCO2 arterial: 31 mmHg — ABNORMAL LOW (ref 32–48)
pH, Arterial: 7.09 — CL (ref 7.35–7.45)
pH, Arterial: 7.57 — ABNORMAL HIGH (ref 7.35–7.45)
pO2, Arterial: 72 mmHg — ABNORMAL LOW (ref 83–108)
pO2, Arterial: 164 mmHg — ABNORMAL HIGH (ref 83–108)

## 2022-01-01 LAB — CBG MONITORING, ED: Glucose-Capillary: 158 mg/dL — ABNORMAL HIGH (ref 70–99)

## 2022-01-01 LAB — RAPID URINE DRUG SCREEN, HOSP PERFORMED
Amphetamines: NOT DETECTED
Barbiturates: NOT DETECTED
Benzodiazepines: NOT DETECTED
Cocaine: POSITIVE — AB
Opiates: NOT DETECTED
Tetrahydrocannabinol: NOT DETECTED

## 2022-01-01 LAB — LACTIC ACID, PLASMA: Lactic Acid, Venous: 1.3 mmol/L (ref 0.5–1.9)

## 2022-01-01 LAB — APTT: aPTT: 23 seconds — ABNORMAL LOW (ref 24–36)

## 2022-01-01 LAB — ETHANOL: Alcohol, Ethyl (B): <10 mg/dL (ref <10)

## 2022-01-01 LAB — TROPONIN I (HIGH SENSITIVITY): Troponin I (High Sensitivity): 12 ng/L (ref <18)

## 2022-01-01 LAB — PROTIME-INR
INR: 1 (ref 0.8–1.2)
Prothrombin Time: 13.3 seconds (ref 11.4–15.2)

## 2022-01-01 MED ORDER — SODIUM CHLORIDE 0.9 % IV SOLN
2.0000 g | INTRAVENOUS | Status: DC
  Administered 2022-01-01 – 2022-01-02 (×2): 2 g via INTRAVENOUS
  Filled 2022-01-01 (×2): qty 20

## 2022-01-01 MED ORDER — SODIUM CHLORIDE 0.9 % IV BOLUS (SEPSIS)
1000.0000 mL | Freq: Once | INTRAVENOUS | Status: AC
  Administered 2022-01-01: 1000 mL via INTRAVENOUS

## 2022-01-01 MED ORDER — HEPARIN (PORCINE) 25000 UT/250ML-% IV SOLN
INTRAVENOUS | Status: AC
  Filled 2022-01-01: qty 250

## 2022-01-01 MED ORDER — ORAL CARE MOUTH RINSE: 15.0000 mL | OROMUCOSAL | Status: DC | PRN

## 2022-01-01 MED ORDER — ROCURONIUM BROMIDE 50 MG/5ML IV SOLN: 100.0000 mg | Freq: Once | INTRAVENOUS | Status: AC

## 2022-01-01 MED ORDER — IOHEXOL 350 MG/ML SOLN
180.0000 mL | Freq: Once | INTRAVENOUS | Status: AC | PRN
  Administered 2022-01-01: 160 mL via INTRAVENOUS

## 2022-01-01 MED ORDER — NOREPINEPHRINE 4 MG/250ML-% IV SOLN
INTRAVENOUS | Status: AC
  Administered 2022-01-01: 5 ug/min
  Filled 2022-01-01: qty 250

## 2022-01-01 MED ORDER — LORAZEPAM 2 MG/ML IJ SOLN
INTRAMUSCULAR | Status: AC
  Administered 2022-01-01: 2 mg via INTRAVENOUS
  Filled 2022-01-01: qty 1

## 2022-01-01 MED ORDER — ROCURONIUM BROMIDE 10 MG/ML (PF) SYRINGE
PREFILLED_SYRINGE | INTRAVENOUS | Status: AC
  Administered 2022-01-01: 100 mg via INTRAVENOUS
  Filled 2022-01-01: qty 10

## 2022-01-01 MED ORDER — HEPARIN BOLUS VIA INFUSION
5000.0000 [IU] | Freq: Once | INTRAVENOUS | Status: AC
  Administered 2022-01-01: 5000 [IU] via INTRAVENOUS

## 2022-01-01 MED ORDER — SODIUM CHLORIDE 0.9 % IV SOLN
500.0000 mg | INTRAVENOUS | Status: DC
  Administered 2022-01-01 – 2022-01-02 (×2): 500 mg via INTRAVENOUS
  Filled 2022-01-01 (×3): qty 5

## 2022-01-01 MED ORDER — SUCCINYLCHOLINE CHLORIDE 200 MG/10ML IV SOSY
PREFILLED_SYRINGE | INTRAVENOUS | Status: AC
  Filled 2022-01-01: qty 10

## 2022-01-01 MED ORDER — LACTATED RINGERS IV SOLN: INTRAVENOUS | Status: AC

## 2022-01-01 MED ORDER — PROPOFOL 1000 MG/100ML IV EMUL
INTRAVENOUS | Status: AC
  Administered 2022-01-01: 50 ug/kg/min
  Filled 2022-01-01: qty 100

## 2022-01-01 MED ORDER — LORAZEPAM 2 MG/ML IJ SOLN
INTRAMUSCULAR | Status: AC
  Administered 2022-01-01: 2 mg
  Filled 2022-01-01: qty 1

## 2022-01-01 MED ORDER — HEPARIN (PORCINE) 25000 UT/250ML-% IV SOLN
1600.0000 [IU]/h | INTRAVENOUS | Status: DC
  Administered 2022-01-01 – 2022-01-02 (×2): 1600 [IU]/h via INTRAVENOUS
  Filled 2022-01-01: qty 250

## 2022-01-01 MED ORDER — ETOMIDATE 2 MG/ML IV SOLN
INTRAVENOUS | Status: AC
  Administered 2022-01-01: 30 mg via INTRAVENOUS
  Filled 2022-01-01: qty 10

## 2022-01-01 MED ORDER — LORAZEPAM 2 MG/ML IJ SOLN: 2.0000 mg | Freq: Once | INTRAMUSCULAR | Status: AC

## 2022-01-01 MED ORDER — SODIUM CHLORIDE 0.9 % IV BOLUS
1000.0000 mL | Freq: Once | INTRAVENOUS | Status: AC
Start: 2022-01-01 — End: 2022-01-01
  Administered 2022-01-01: 1000 mL via INTRAVENOUS

## 2022-01-01 MED ORDER — ETOMIDATE 2 MG/ML IV SOLN
INTRAVENOUS | Status: AC
  Filled 2022-01-01: qty 10

## 2022-01-01 MED ORDER — ETOMIDATE 2 MG/ML IV SOLN: 30.0000 mg | Freq: Once | INTRAVENOUS | Status: AC

## 2022-01-01 MED ORDER — CHLORHEXIDINE GLUCONATE CLOTH 2 % EX PADS
6.0000 | MEDICATED_PAD | Freq: Every day | CUTANEOUS | Status: DC
  Administered 2022-01-01 – 2022-01-06 (×5): 6 via TOPICAL

## 2022-01-01 MED ORDER — LEVETIRACETAM IN NACL 1000 MG/100ML IV SOLN
1000.0000 mg | Freq: Once | INTRAVENOUS | Status: AC
  Administered 2022-01-01: 1000 mg via INTRAVENOUS
  Filled 2022-01-01: qty 100

## 2022-01-01 MED ORDER — ORAL CARE MOUTH RINSE
15.0000 mL | OROMUCOSAL | Status: DC
  Administered 2022-01-01 – 2022-01-03 (×21): 15 mL via OROMUCOSAL

## 2022-01-01 NOTE — Progress Notes (Signed)
ANTICOAGULATION CONSULT NOTE - Initial Consult  Pharmacy Consult for heparin Indication: rule out pulmonary embolus  No Known Allergies  Patient Measurements: Height: '6\' 2"'$  (188 cm) Weight: 117.5 kg (259 lb) IBW/kg (Calculated) : 82.2 Heparin Dosing Weight: HEPARIN DW (KG): 107.2   Vital Signs: Temp: 96.8 F (36 C) (11/22 1958) Temp Source: Oral (11/22 1958) BP: 165/91 (11/22 2030) Pulse Rate: 114 (11/22 2030)  Labs: Recent Labs    01/01/22 2002  HGB 13.5  HCT 43.2  PLT 415*  CREATININE 1.28*  TROPONINIHS 12    Estimated Creatinine Clearance: 86.7 mL/min (A) (by C-G formula based on SCr of 1.28 mg/dL (H)).   Medical History: Past Medical History:  Diagnosis Date   Arthritis    Class 1 obesity 06/77/0340   Complication of anesthesia    slow to wake up   Essential hypertension 07/18/2020   Headache    Hypertension    Hypertriglyceridemia    Numbness of fingers of both hands    Prostate cancer ([place]) 2017   Sleep apnea    CPAP    Medications:  (Not in a hospital admission)  Med History in progress.  No anticoagulants PTA.  Assessment: 56 yo M with 1 week hx of LLE swelling, brought in by EMS after being found down.  Intubated in the ED.  Pharmacy consulted to start heparin for rule-out PE.  Noted CT scan with filling defects but no definitive PE.  Repeat CT or VQ scan recommended.  Goal of Therapy:  Heparin level 0.3-0.7 units/ml Monitor platelets by anticoagulation protocol: Yes   Plan:  Give 5000 units bolus x 1 Start heparin infusion at 1600 units/hr Check anti-Xa level in 6 hours and daily while on heparin Continue to monitor H&H and platelets Follow-up plans for repeat imaging to rule out PE  [employer], Pharm.D., BCPS Clinical Pharmacist  01/01/2022 9:15 PM

## 2022-01-01 NOTE — Progress Notes (Signed)
[facility] called at this time to get report from handoff RN. The RN was busy at this time w/ an intervention. My contact number left for a return call.

## 2022-01-01 NOTE — Progress Notes (Signed)
RT at bedside for pt transport from [place].

## 2022-01-01 NOTE — Progress Notes (Signed)
Accepted to [facility]  EtOH hx Found down ?response to narcan Aspiration on imaging, no PE Seized in ER, required intubation UDS, alcohol level pending  To [facility] for  - CAP vs. Aspiration - Possible withdrawal seizures - Suspicion for polysubstance overdose or postictal state - Metabolic and hypercarbic encephalopathy NOS Erskine Emery MD PCCM

## 2022-01-01 NOTE — ED Provider Notes (Addendum)
[facility] EMERGENCY DEPARTMENT Provider Note   CSN: [id number] Arrival date & time: 01/01/22  1950     History  Chief Complaint  Patient presents with   Altered Mental Status    Andrew Haley is a 56 y.o. male.  HPI   This patient is a 56 year old male, he has a history of hypertension on losartan, he has also been prescribed oxycodone for some chronic pain in his left ankle which he incurred after having an injury.  He has recently developed some significant swelling in his left leg and has been given furosemide which she has been told to take as needed, this last week he has been taking more and more but the leg is getting bigger and bigger.  Today [place], the paramedics were called and found the patient to be tachycardic, breathing slowly, they gave Narcan and seemed to have some improvement but the patient denies taking any increased oxycodone in fact he states he has not had any today.  The patient is very ill-appearing on exam  Home Medications Prior to Admission medications   Medication Sig Start Date End Date Taking? Authorizing Provider  acetaminophen (TYLENOL) 325 MG tablet Take 3 tablets (975 mg total) by mouth every 6 (six) hours. 03/07/21   Irving Copas, PA-C  cyclobenzaprine (FLEXERIL) 10 MG tablet Take 1 tablet (10 mg total) by mouth 3 (three) times daily as needed for muscle spasms. 03/07/21   Irving Copas, PA-C  docusate sodium (COLACE) 100 MG capsule Take 1 capsule (100 mg total) by mouth 2 (two) times daily. 03/07/21   Irving Copas, PA-C  ferrous sulfate 325 (65 FE) MG tablet Take 1 tablet (325 mg total) by mouth 3 (three) times daily after meals for 14 days. 03/07/21 03/21/21  Irving Copas, PA-C  losartan (COZAAR) 50 MG tablet Take 50 mg by mouth daily. 08/30/20   [provider]  losartan-hydrochlorothiazide (HYZAAR) 50-12.5 MG tablet Take 1 tablet by mouth  daily. Patient not taking: Reported on 11/16/2020 07/19/20 07/19/21  Barton Dubois, MD  naloxone [facility]) nasal spray 4 mg/0.1 mL Spray into nostrils if patient shows signs of opioid overdose, then call 911 03/07/21   Irving Copas, PA-C  oxyCODONE (ROXICODONE) 5 MG immediate release tablet Take 1-3 tablets (5-15 mg total) by mouth every 4 (four) hours as needed for severe pain. Do NOT take with normal Oxycodone prescription at home. 03/07/21   Irving Copas, PA-C  polyethylene glycol (MIRALAX / GLYCOLAX) 17 g packet Take 17 g by mouth daily as needed for mild constipation. 03/07/21   Irving Copas, PA-C      Allergies    Patient has no known allergies.    Review of Systems   Review of Systems  All other systems reviewed and are negative.   Physical Exam Updated Vital Signs BP (!) 155/90 (BP Location: Left Arm)   Pulse (!) 113   Temp (!) 96.8 F (36 C) (Oral)   Resp (!) 24   Ht 1.88 m (_0 )   Wt 117.5 kg   SpO2 96%   BMI 33.25 kg/m  Physical Exam Vitals and nursing note reviewed.  Constitutional:      General: He is in acute distress.     Appearance: He is well-developed. He is ill-appearing, toxic-appearing and diaphoretic.  HENT:     Head: Normocephalic and atraumatic.  Nose: No congestion or rhinorrhea.     Mouth/Throat:     Pharynx: No oropharyngeal exudate.  Eyes:     General: No scleral icterus.       Right eye: No discharge.        Left eye: No discharge.     Conjunctiva/sclera: Conjunctivae normal.     Pupils: Pupils are equal, round, and reactive to light.  Neck:     Thyroid: No thyromegaly.     Vascular: No JVD.  Cardiovascular:     Rate and Rhythm: Regular rhythm. Tachycardia present.     Heart sounds: Normal heart sounds. No murmur heard.    No friction rub. No gallop.  Pulmonary:     Effort: Respiratory distress present.     Breath sounds: Wheezing, rhonchi and rales present.  Abdominal:     General: Bowel sounds are normal. There is no  distension.     Palpations: Abdomen is soft. There is no mass.     Tenderness: There is no abdominal tenderness.  Musculoskeletal:        General: No tenderness. Normal range of motion.     Cervical back: Normal range of motion and neck supple.     Right lower leg: Edema present.     Left lower leg: Edema present.     Comments: Significant left lower extremity edema compared to right lower extremity edema.  Lymphadenopathy:     Cervical: No cervical adenopathy.  Skin:    General: Skin is warm.     Findings: No erythema or rash.  Neurological:     Mental Status: He is alert.     Coordination: Coordination normal.     Comments: The patient is awake and alert and able to follow commands but appears very agitated, he cannot sit still  Psychiatric:        Behavior: Behavior normal.     ED Results / Procedures / Treatments   Labs (all labs ordered are listed, but only abnormal results are displayed) Labs Reviewed  CBC - Abnormal; Notable for the following components:      Result Value   WBC 17.0 (*)    Platelets 415 (*)    All other components within normal limits  BLOOD GAS, ARTERIAL - Abnormal; Notable for the following components:   pH, Arterial 7.09 (*)    pCO2 arterial 117 (*)    pO2, Arterial 72 (*)    Bicarbonate 36.2 (*)    Acid-Base Excess 2.7 (*)    All other components within normal limits  CBG MONITORING, ED - Abnormal; Notable for the following components:   Glucose-Capillary 158 (*)    All other components within normal limits  RESP PANEL BY RT-PCR (FLU A&B, COVID) ARPGX2  CULTURE, BLOOD (ROUTINE X 2)  CULTURE, BLOOD (ROUTINE X 2)  URINE CULTURE  ETHANOL  BASIC METABOLIC PANEL  RAPID URINE DRUG SCREEN, HOSP PERFORMED  LACTIC ACID, PLASMA  LACTIC ACID, PLASMA  PROTIME-INR  APTT  URINALYSIS, ROUTINE W REFLEX MICROSCOPIC  TROPONIN I (HIGH SENSITIVITY)    EKG EKG Interpretation  Date/Time:  Wednesday January 01 2022 19:58:05 EST Ventricular Rate:   114 PR Interval:    QRS Duration: 126 QT Interval:  341 QTC Calculation: 470 R Axis:   -63 Text Interpretation: Sinus tachycardia Non-specific intra-ventricular conduction delay Since last tracing rate faster Confirmed by Noemi Chapel [id number]) on 01/01/2022 8:05:26 PM  Radiology CT Angio Chest PE W and/or Wo Contrast  Result Date: 01/01/2022 CLINICAL DATA:  Found unresponsive, syncope. EXAM: CT ANGIOGRAPHY CHEST WITH CONTRAST TECHNIQUE: Multidetector CT imaging of the chest was performed using the standard protocol during bolus administration of intravenous contrast. Multiplanar CT image reconstructions and MIPs were obtained to evaluate the vascular anatomy. RADIATION DOSE REDUCTION: This exam was performed according to the departmental dose-optimization program which includes automated exposure control, adjustment of the mA and/or kV according to patient size and/or use of iterative reconstruction technique. CONTRAST:  173m OMNIPAQUE IOHEXOL 350 MG/ML SOLN COMPARISON:  11/16/2020. FINDINGS: Cardiovascular: Heart is normal in size and there is no pericardial effusion. Coronary artery calcifications are noted. There is calcification of the aortic annulus with aneurysmal dilatation of the ascending aorta measuring 4.8 cm. The pulmonary trunk is normal in caliber. There is a questionable segmental pulmonary artery filling defect in the right upper lobe, axial image 166. There is suboptimal opacification of the lobar, segmental, and subsegmental arteries due to timing of contrast bolus and mixing artifact. Findings for right heart strain are equivocal. Mediastinum/Nodes: Thyroid gland is enlarged with multiple subcentimeter hypodense nodules. The trachea is within normal limits. The esophagus is fluid-filled. No mediastinal, hilar, or axillary lymphadenopathy. Lungs/Pleura: Patchy infiltrates are noted in the lungs bilaterally with a basilar and lower lobe predominance. No effusion or pneumothorax. Upper  Abdomen: No acute abnormality. Musculoskeletal: Degenerative changes are present in the thoracic spine. No acute osseous abnormality. Review of the MIP images confirms the above findings. IMPRESSION: 1. Suboptimal opacification of the pulmonary arteries. There is a questionable segmental pulmonary artery filling defect in the right upper lobe. Evaluation for right heart strain is equivocal. Repeat evaluation or V/Q scan is suggested for follow-up. 2. Patchy opacities in the lungs bilaterally with a basilar and lower lobe predominance, possible edema or infiltrate. 3. Atherosclerotic calcification of the aorta with aneurysmal dilatation of the ascending aorta measuring 4.8 cm. Ascending thoracic aortic aneurysm. Recommend semi-annual imaging followup by CTA or MRA and referral to cardiothoracic surgery if not already obtained. This recommendation follows 2010 ACCF/AHA/AATS/ACR/ASA/SCA/SCAI/SIR/STS/SVM Guidelines for the Diagnosis and Management of Patients With Thoracic Aortic Disease. Circulation. 2010; 121:: W979-G921 Aortic aneurysm NOS (ICD10-I71.9) 4. Coronary artery calcifications. Electronically Signed   By: LBrett FairyM.D.   On: 01/01/2022 20:56    Procedures .Critical Care  Performed by: MNoemi Chapel MD Authorized by: MNoemi Chapel MD   Critical care provider statement:    Critical care time (minutes):  75   Critical care time was exclusive of:  Separately billable procedures and treating other patients and teaching time   Critical care was necessary to treat or prevent imminent or life-threatening deterioration of the following conditions:  Respiratory failure, CNS failure or compromise and toxidrome   Critical care was time spent personally by me on the following activities:  Development of treatment plan with patient or surrogate, discussions with consultants, evaluation of patient's response to treatment, examination of patient, ordering and review of laboratory studies, ordering and  review of radiographic studies, ordering and performing treatments and interventions, pulse oximetry, re-evaluation of patient's condition, review of old charts and obtaining history from patient or surrogate   I assumed direction of critical care for this patient from another provider in my specialty: no     Care discussed with: admitting provider and accepting provider at another facility   Comments:       Procedure Name: Intubation Date/Time: 01/01/2022 8:58 PM  Performed by: MNoemi Chapel MDPre-anesthesia Checklist: Patient identified, Patient being monitored, Emergency Drugs available, Timeout performed and Suction available  Oxygen Delivery Method: Non-rebreather mask Preoxygenation: Pre-oxygenation with 100% oxygen Induction Type: Rapid sequence Ventilation: Mask ventilation without difficulty Laryngoscope Size: Mac and 4 Tube size: 7.5 mm Number of attempts: 1 Airway Equipment and Method: Stylet Placement Confirmation: ETT inserted through vocal cords under direct vision, CO2 detector and Breath sounds checked- equal and bilateral Secured at: 24 cm Tube secured with: ETT holder Dental Injury: Teeth and Oropharynx as per pre-operative assessment  Difficulty Due To: Difficulty was unanticipated Comments:      .Central Line  Date/Time: 01/01/2022 10:13 PM  Performed by: Noemi Chapel, MD Authorized by: Noemi Chapel, MD   Consent:    Consent obtained:  Emergent situation Universal protocol:    Test results available: yes     Imaging studies available: yes     Required blood products, implants, devices, and special equipment available: yes     Site/side marked: yes     Immediately prior to procedure, a time out was called: yes     Patient identity confirmed:  Arm band Pre-procedure details:    Indication(s): central venous access, hemodynamic monitoring and insufficient peripheral access     Hand hygiene: Hand hygiene performed prior to insertion     Sterile barrier  technique: All elements of maximal sterile technique followed     Skin preparation:  Chlorhexidine   Skin preparation agent: Skin preparation agent completely dried prior to procedure   Anesthesia:    Anesthesia method:  None Procedure details:    Location:  R internal jugular   Patient position:  Supine   Procedural supplies:  Triple lumen   Catheter size:  7 Fr   Landmarks identified: yes     Ultrasound guidance: yes     Ultrasound guidance timing: prior to insertion and real time     Sterile ultrasound techniques: Sterile gel and sterile probe covers were used     Number of attempts:  1   Successful placement: yes   Post-procedure details:    Post-procedure:  Dressing applied and line sutured   Assessment:  Blood return through all ports, free fluid flow, no pneumothorax on x-ray and placement verified by x-ray   Procedure completion:  Tolerated Comments:           Medications Ordered in ED Medications  heparin 25000 UT/250ML infusion (  Not Given 01/01/22 2052)  lactated ringers infusion (has no administration in time range)  sodium chloride 0.9 % bolus 1,000 mL (1,000 mLs Intravenous New Bag/Given 01/01/22 2059)  cefTRIAXone (ROCEPHIN) 2 g in sodium chloride 0.9 % 100 mL IVPB (has no administration in time range)  azithromycin (ZITHROMAX) 500 mg in sodium chloride 0.9 % 250 mL IVPB (has no administration in time range)  etomidate (AMIDATE) 2 MG/ML injection (has no administration in time range)  LORazepam (ATIVAN) 2 MG/ML injection (has no administration in time range)  propofol (DIPRIVAN) 1000 MG/100ML infusion (has no administration in time range)  sodium chloride 0.9 % bolus 1,000 mL (1,000 mLs Intravenous New Bag/Given 01/01/22 2033)  iohexol (OMNIPAQUE) 350 MG/ML injection 180 mL (160 mLs Intravenous Contrast Given 01/01/22 2033)  LORazepam (ATIVAN) injection 2 mg (2 mg Intravenous Given 01/01/22 2038)  rocuronium (ZEMURON) injection 100 mg (100 mg Intravenous Given  01/01/22 2046)  etomidate (AMIDATE) injection 30 mg (30 mg Intravenous Given 01/01/22 2046)    ED Course/ Medical Decision Making/ A&P  Medical Decision Making Amount and/or Complexity of Data Reviewed Labs: ordered. Radiology: ordered. ECG/medicine tests: ordered.  Risk Prescription drug management. Decision regarding hospitalization.   This patient presents to the ED for concern of her mental status and possible syncope, this involves an extensive number of treatment options, and is a complaint that carries with it a high risk of complications and morbidity.  The differential diagnosis includes seizure, drug overdose, pulmonary embolism   Co morbidities that complicate the patient evaluation  Hypertension, significant swelling of the leg   Additional history obtained:  Additional history obtained from electronic medical record External records from outside source obtained and reviewed including prior records reviewed, he is followed closely in the office, he has had multiple visits over the last year for chronic management of pain   Lab Tests:  I Ordered, and personally interpreted labs.  The pertinent results include: CBC showing a leukocytosis, ABG showing severe acidosis with a pH of 7.09 and a PCO2 of 117 with a PO2 of 72 on a nonrebreather.  Metabolic panel is pending, alcohol level is less than 10   Imaging Studies ordered:  I ordered imaging studies including CT angiogram of the chest I independently visualized and interpreted imaging which showed by basilar infiltrates which are significant, no obvious pulmonary embolism, ill timed bolus I agree with the radiologist interpretation   Cardiac Monitoring: / EKG:  The patient was maintained on a cardiac monitor.  I personally viewed and interpreted the cardiac monitored which showed an underlying rhythm of: EKG shows sinus tachycardia.  Cardiac monitoring shows sinus  tachycardia.   Consultations Obtained:  I requested consultation with the intensive care unit physician Dr. Ina Homes,  and discussed lab and imaging findings as well as pertinent plan - they recommend: Admission to the ICU at [facility], they have accepted the patient and there is one medical ICU bed awaiting   Problem List / ED Course / Critical interventions / Medication management  The patient is critically ill initially thought to possibly have a pulmonary embolism given the swelling in his leg and the shortness of breath however I also considered that this could be a postictal event as the patient does have an alcohol history and was found with some incontinence.  What clouded the picture is that the patient has chronic urinary incontinence secondary to prostate issues.  After CT scan was performed and there is no obvious pulmonary embolism the patient was brought back to the room and placed on a nonrebreather which was necessary given his hypoxia.  ABG was performed showing significant acidosis likely a respiratory acidosis and the patient subsequently had a seizure that lasted at least 3 minutes, it required 2 mg of Ativan to resolve and unfortunately at that point the patient went essentially apneic with no gag reflex.  It required intubation.  The patient was then given another dose of Ativan to help with seizures and sedation as well as a propofol drip. The patient eventually became hypotensive on the propofol, we tried to wean it off but he remained mildly hypotensive and required a central line and Levophed which I personally placed, see the central line note above. I ordered medication including antibiotics for possible aspiration pneumonia, IV fluids, benzodiazepines, etomidate and rocuronium for treatment of what appears to be likely alcohol withdrawal seizures.  Evidently the patient also has a history of cocaine use Reevaluation of the patient after these medicines showed that the  patient critically ill, will need ICU level  care I have reviewed the patients home medicines and have made adjustments as needed   Social Determinants of Health:  Multiple substance abuse, chronic illness   Test / Admission - Considered:  High level of care admission.  Patient intubated successfully on first attempt         Final Clinical Impression(s) / ED Diagnoses Final diagnoses:  Respiratory acidosis  Alcohol withdrawal seizure with complication ([place])  Aspiration pneumonia of both lower lobes, unspecified aspiration pneumonia type [facility])     Noemi Chapel, MD 01/01/22 2112    Noemi Chapel, MD 01/01/22 2214

## 2022-01-01 NOTE — H&P (Signed)
NAME:  Andrew Haley, MRN:  [id number], DOB:  01-16-1966, LOS: 0 ADMISSION DATE:  01/01/2022, CONSULTATION DATE:  01/01/22 REFERRING MD:  Sabra Heck, CHIEF COMPLAINT:  AMS   History of Present Illness:  Andrew Haley is a 56 y.o. M with PMH of obesity, ETOH and polysubstance abuse, HTN, prostate Ca and OSA who presented to [facility] ED [place].  EMS found patient tachycardic with decreased respirations, Narcan was administered which improved mental status marginally.  He has recently been prescribed oxycodone for L ankle chronic pain, but denied taking more than prescribed.  He also noted significant LLE edema and has recently been taking Lasix.  He seized in the ED and  was significantly hypercarbic in the ED and was intubated.  Work-up significant for WBC 17k, Creatinine 1.2, ETOH <10, CT chest without clear PE, patchy opacities bilaterally and 4.8cm ascending thoracic aortic aneurysm.  PCCM consult and transfer to [facility]  Pertinent  Medical History   has a past medical history of Arthritis, Class 1 obesity (46/96/2952), Complication of anesthesia, Essential hypertension (07/18/2020), Headache, Hypertension, Hypertriglyceridemia, Numbness of fingers of both hands, Prostate cancer ([place]) (2017), and Sleep apnea.   Significant Hospital Events: Including procedures, antibiotic start and stop dates in addition to other pertinent events   11/22 presented to [facility] ED, AMS, Sz, PNA intubated. Transfer to [facility]  Interim History / Subjective:    Objective   Blood pressure (!) 165/91, pulse 88, temperature (!) 97.5 F (36.4 C), resp. rate (!) 26, height '6\' 2"'$  (1.88 m), weight 117.5 kg, SpO2 100 %.    Vent Mode: PRVC FiO2 (%):  [100 %] 100 % Set Rate:  [26 bmp] 26 bmp Vt Set:  [841 mL] 660 mL PEEP:  [5 cmH20] 5 cmH20 Plateau Pressure:  [22 cmH20] 22 cmH20  No intake or output data in the 24 hours ending 01/01/22 2123 Filed Weights   01/01/22 2005  Weight: 117.5  kg   See attending attestation  Resolved Hospital Problem list     Assessment & Plan:   Acute Encephalopathy Seizure activity Acute hypercarbic respiratory failure History substance abuse Intubated, UDS pending, ETOH <10 Possible withdrawal seizure  -admit to ICU -PAD protocol -CTH -EEG -prn ativan --Maintain full vent support with SAT/SBT as tolerated -titrate Vent setting to maintain SpO2 greater than or equal to 90%. -HOB elevated 30 degrees. -Plateau pressures less than 30 cm H20.  -Follow chest x-ray, ABG prn.   -Bronchial hygiene and RT/bronchodilator protocol. -thiamine and folate   CAP vs Aspiration Lactic acid WNL -continue CAP coverage -vent support with SBT/SAT as able -respiratory culture  History HTN -hold home cozaar and   Best Practice (right click and "Reselect all SmartList Selections" daily)   Diet/type: NPO DVT prophylaxis: prophylactic heparin  GI prophylaxis: PPI Lines: N/A Foley:  Yes, and it is still needed Code Status:  full code Last date of multidisciplinary goals of care discussion [pending]  Labs   CBC: Recent Labs  Lab 01/01/22 2002  WBC 17.0*  HGB 13.5  HCT 43.2  MCV 89.4  PLT 415*    Basic Metabolic Panel: Recent Labs  Lab 01/01/22 2002  NA 140  K 4.3  CL 97*  CO2 30  GLUCOSE 256*  BUN 22*  CREATININE 1.28*  CALCIUM 9.1   GFR: Estimated Creatinine Clearance: 86.7 mL/min (A) (by C-G formula based on SCr of 1.28 mg/dL (H)). Recent Labs  Lab 01/01/22 2002 01/01/22 2049  WBC 17.0*  --   LATICACIDVEN  --  1.3    Liver Function Tests: No results for input(s): "AST", "ALT", "ALKPHOS", "BILITOT", "PROT", "ALBUMIN" in the last 168 hours. No results for input(s): "LIPASE", "AMYLASE" in the last 168 hours. No results for input(s): "AMMONIA" in the last 168 hours.  ABG    Component Value Date/Time   PHART 7.09 (LL) 01/01/2022 2035   PCO2ART 117 (HH) 01/01/2022 2035   PO2ART 72 (L) 01/01/2022 2035   HCO3  36.2 (H) 01/01/2022 2035   TCO2 25 02/21/2008 1403   O2SAT 90.6 01/01/2022 2035     Coagulation Profile: No results for input(s): "INR", "PROTIME" in the last 168 hours.  Cardiac Enzymes: No results for input(s): "CKTOTAL", "CKMB", "CKMBINDEX", "TROPONINI" in the last 168 hours.  HbA1C: No results found for: "HGBA1C"  CBG: Recent Labs  Lab 01/01/22 2041  GLUCAP 158*    Review of Systems:   Unable to obtain  Past Medical History:  He,  has a past medical history of Arthritis, Class 1 obesity (82/95/6213), Complication of anesthesia, Essential hypertension (07/18/2020), Headache, Hypertension, Hypertriglyceridemia, Numbness of fingers of both hands, Prostate cancer ([place]) (2017), and Sleep apnea.   Surgical History:   Past Surgical History:  Procedure Laterality Date   ANKLE SURGERY Left    COLONOSCOPY WITH PROPOFOL N/A 09/28/2017   Procedure: COLONOSCOPY WITH PROPOFOL;  Surgeon: Daneil Dolin, MD;  [facility];  Service: Endoscopy;  Laterality: N/A;  12:00pm   HAND SURGERY Right 2003   cysts    JOINT REPLACEMENT     LESION REMOVAL Left 09/23/2013   Procedure: MINOR EXCISION 3 CM SKIN LESION OF LEFT THIGH ;  Surgeon: Jamesetta So, MD;  Location: [facility] ORS;  Service: General;  Laterality: Left;   LYMPHADENECTOMY Bilateral 11/29/2015   Procedure: LYMPHADENECTOMY;  Surgeon: Raynelle Bring, MD;  [facility];  Service: Urology;  Laterality: Bilateral;   ROBOT ASSISTED LAPAROSCOPIC RADICAL PROSTATECTOMY N/A 11/29/2015   Procedure: XI ROBOTIC ASSISTED LAPAROSCOPIC RADICAL PROSTATECTOMY LEVEL 3;  Surgeon: Raynelle Bring, MD;  [facility];  Service: Urology;  Laterality: N/A;   TOTAL KNEE ARTHROPLASTY Left 02/22/2014   Procedure: LEFT TOTAL KNEE ARTHROPLASTY;  Surgeon: Tobi Bastos, MD;  [facility];  Service: Orthopedics;  Laterality: Left;   TOTAL KNEE ARTHROPLASTY Right 05/23/2014   Procedure: RIGHT TOTAL KNEE ARTHROPLASTY;  Surgeon: Latanya Maudlin,  MD;  [facility];  Service: Orthopedics;  Laterality: Right;   TOTAL KNEE REVISION Right 03/04/2021   Procedure: TOTAL KNEE REVISION;  Surgeon: Paralee Cancel, MD;  Location[facility];  Service: Orthopedics;  Laterality: Right;     Social History:   reports that he has never smoked. He has never used smokeless tobacco. He reports current alcohol use. He reports that he does not currently use drugs after having used the following drugs: Cocaine.   Family History:  His family history includes Heart disease in his mother; Throat cancer in his father. There is no history of Colon cancer or Colon polyps.   Allergies No Known Allergies   Home Medications  Prior to Admission medications   Medication Sig Start Date End Date Taking? Authorizing Provider  acetaminophen (TYLENOL) 325 MG tablet Take 3 tablets (975 mg total) by mouth every 6 (six) hours. 03/07/21   Irving Copas, PA-C  cyclobenzaprine (FLEXERIL) 10 MG tablet Take 1 tablet (10 mg total) by mouth 3 (three) times daily as needed for muscle spasms. 03/07/21  Irving Copas, PA-C  docusate sodium (COLACE) 100 MG capsule Take 1 capsule (100 mg total) by mouth 2 (two) times daily. 03/07/21   Irving Copas, PA-C  ferrous sulfate 325 (65 FE) MG tablet Take 1 tablet (325 mg total) by mouth 3 (three) times daily after meals for 14 days. 03/07/21 03/21/21  Irving Copas, PA-C  losartan (COZAAR) 50 MG tablet Take 50 mg by mouth daily. 08/30/20   [provider]  losartan-hydrochlorothiazide (HYZAAR) 50-12.5 MG tablet Take 1 tablet by mouth daily. Patient not taking: Reported on 11/16/2020 07/19/20 07/19/21  Barton Dubois, MD  naloxone [facility]) nasal spray 4 mg/0.1 mL Spray into nostrils if patient shows signs of opioid overdose, then call 911 03/07/21   Irving Copas, PA-C  oxyCODONE (ROXICODONE) 5 MG immediate release tablet Take 1-3 tablets (5-15 mg total) by mouth every 4 (four) hours as needed for severe pain. Do NOT take with  normal Oxycodone prescription at home. 03/07/21   Irving Copas, PA-C  polyethylene glycol (MIRALAX / GLYCOLAX) 17 g packet Take 17 g by mouth daily as needed for mild constipation. 03/07/21   Irving Copas, PA-C     Critical care time:  40 minutes     CRITICAL CARE Performed by: Otilio Carpen Erickson Yamashiro   Total critical care time: 40 minutes  Critical care time was exclusive of separately billable procedures and treating other patients.  Critical care was necessary to treat or prevent imminent or life-threatening deterioration.  Critical care was time spent personally by me on the following activities: development of treatment plan with patient and/or surrogate as well as nursing, discussions with consultants, evaluation of patient's response to treatment, examination of patient, obtaining history from patient or surrogate, ordering and performing treatments and interventions, ordering and review of laboratory studies, ordering and review of radiographic studies, pulse oximetry and re-evaluation of patient's condition.   Otilio Carpen Marci Polito, PA-C [facility] See Amion for pager If no response to pager , please call [phone] until 7pm After 7:00 pm call Elink  [phone]

## 2022-01-01 NOTE — ED Triage Notes (Signed)
Pt BIB [facility] after being found unresponsive, v/s upon arrival 120s, O2 88%, RR10, CBG 194; pt was given Narcan 0.4, pt c/o leg cramps, EDP at bedside upon pt arrival

## 2022-01-01 NOTE — Sepsis Progress Note (Signed)
Following per sepsis protocol   

## 2022-01-02 ENCOUNTER — Inpatient Hospital Stay (HOSPITAL_COMMUNITY): Payer: Medicare HMO

## 2022-01-02 ENCOUNTER — Encounter (HOSPITAL_COMMUNITY): Payer: Medicare HMO

## 2022-01-02 DIAGNOSIS — G40901 Epilepsy, unspecified, not intractable, with status epilepticus: Secondary | ICD-10-CM

## 2022-01-02 DIAGNOSIS — J9601 Acute respiratory failure with hypoxia: Secondary | ICD-10-CM | POA: Diagnosis present

## 2022-01-02 DIAGNOSIS — R600 Localized edema: Secondary | ICD-10-CM

## 2022-01-02 LAB — HEPARIN LEVEL (UNFRACTIONATED)
Heparin Unfractionated: 0.18 IU/mL — ABNORMAL LOW (ref 0.30–0.70)
Heparin Unfractionated: 0.1 IU/mL — ABNORMAL LOW (ref 0.30–0.70)
Heparin Unfractionated: 0.13 IU/mL — ABNORMAL LOW (ref 0.30–0.70)

## 2022-01-02 LAB — POCT I-STAT 7, (LYTES, BLD GAS, ICA,H+H)
Acid-Base Excess: 4 mmol/L — ABNORMAL HIGH (ref 0.0–2.0)
Bicarbonate: 27.7 mmol/L (ref 20.0–28.0)
Calcium, Ion: 1.12 mmol/L — ABNORMAL LOW (ref 1.15–1.40)
HCT: 31 % — ABNORMAL LOW (ref 39.0–52.0)
Hemoglobin: 10.5 g/dL — ABNORMAL LOW (ref 13.0–17.0)
O2 Saturation: 98 %
Patient temperature: 98.3
Potassium: 3.3 mmol/L — ABNORMAL LOW (ref 3.5–5.1)
Sodium: 140 mmol/L (ref 135–145)
TCO2: 29 mmol/L (ref 22–32)
pCO2 arterial: 38.8 mmHg (ref 32–48)
pH, Arterial: 7.461 — ABNORMAL HIGH (ref 7.35–7.45)
pO2, Arterial: 105 mmHg (ref 83–108)

## 2022-01-02 LAB — BASIC METABOLIC PANEL
Anion gap: 10 (ref 5–15)
BUN: 17 mg/dL (ref 6–20)
CO2: 28 mmol/L (ref 22–32)
Calcium: 8.3 mg/dL — ABNORMAL LOW (ref 8.9–10.3)
Chloride: 102 mmol/L (ref 98–111)
Creatinine, Ser: 0.89 mg/dL (ref 0.61–1.24)
GFR, Estimated: >60 mL/min (ref >60)
Glucose, Bld: 108 mg/dL — ABNORMAL HIGH (ref 70–99)
Potassium: 3.4 mmol/L — ABNORMAL LOW (ref 3.5–5.1)
Sodium: 140 mmol/L (ref 135–145)

## 2022-01-02 LAB — GLUCOSE, CAPILLARY
Glucose-Capillary: 101 mg/dL — ABNORMAL HIGH (ref 70–99)
Glucose-Capillary: 105 mg/dL — ABNORMAL HIGH (ref 70–99)
Glucose-Capillary: 107 mg/dL — ABNORMAL HIGH (ref 70–99)
Glucose-Capillary: 88 mg/dL (ref 70–99)
Glucose-Capillary: 90 mg/dL (ref 70–99)

## 2022-01-02 LAB — CBC
HCT: 31.9 % — ABNORMAL LOW (ref 39.0–52.0)
Hemoglobin: 10.5 g/dL — ABNORMAL LOW (ref 13.0–17.0)
MCH: 27.6 pg (ref 26.0–34.0)
MCHC: 32.9 g/dL (ref 30.0–36.0)
MCV: 83.7 fL (ref 80.0–100.0)
Platelets: 269 10*3/uL (ref 150–400)
RBC: 3.81 MIL/uL — ABNORMAL LOW (ref 4.22–5.81)
RDW: 12.8 % (ref 11.5–15.5)
WBC: 13.6 10*3/uL — ABNORMAL HIGH (ref 4.0–10.5)
nRBC: 0 % (ref 0.0–0.2)

## 2022-01-02 LAB — PROCALCITONIN: Procalcitonin: 2.1 ng/mL

## 2022-01-02 LAB — PHOSPHORUS: Phosphorus: 3.1 mg/dL (ref 2.5–4.6)

## 2022-01-02 LAB — HIV ANTIBODY (ROUTINE TESTING W REFLEX): HIV Screen 4th Generation wRfx: NONREACTIVE

## 2022-01-02 LAB — MAGNESIUM: Magnesium: 1.7 mg/dL (ref 1.7–2.4)

## 2022-01-02 LAB — MRSA NEXT GEN BY PCR, NASAL: MRSA by PCR Next Gen: NOT DETECTED

## 2022-01-02 LAB — STREP PNEUMONIAE URINARY ANTIGEN: Strep Pneumo Urinary Antigen: NEGATIVE

## 2022-01-02 MED ORDER — THIAMINE MONONITRATE 100 MG PO TABS
100.0000 mg | ORAL_TABLET | Freq: Every day | ORAL | Status: DC
  Administered 2022-01-02 – 2022-01-03 (×2): 100 mg
  Filled 2022-01-02 (×2): qty 1

## 2022-01-02 MED ORDER — HEPARIN SODIUM (PORCINE) 5000 UNIT/ML IJ SOLN
5000.0000 [IU] | Freq: Three times a day (TID) | INTRAMUSCULAR | Status: DC
  Administered 2022-01-03 – 2022-01-06 (×12): 5000 [IU] via SUBCUTANEOUS
  Filled 2022-01-02 (×12): qty 1

## 2022-01-02 MED ORDER — NOREPINEPHRINE 4 MG/250ML-% IV SOLN
1.0000 ug/min | INTRAVENOUS | Status: DC
  Administered 2022-01-02: 5 ug/min via INTRAVENOUS
  Filled 2022-01-02: qty 250

## 2022-01-02 MED ORDER — POLYETHYLENE GLYCOL 3350 17 G PO PACK
17.0000 g | PACK | Freq: Every day | ORAL | Status: DC
  Administered 2022-01-02 – 2022-01-03 (×2): 17 g
  Filled 2022-01-02 (×2): qty 1

## 2022-01-02 MED ORDER — SODIUM CHLORIDE 0.9 % IV SOLN
INTRAVENOUS | Status: DC | PRN
  Administered 2022-01-02: 400 mL via INTRAVENOUS

## 2022-01-02 MED ORDER — INSULIN ASPART 100 UNIT/ML IJ SOLN
0.0000 [IU] | INTRAMUSCULAR | Status: DC
  Administered 2022-01-03 (×2): 1 [IU] via SUBCUTANEOUS

## 2022-01-02 MED ORDER — LEVETIRACETAM IN NACL 1000 MG/100ML IV SOLN
1000.0000 mg | Freq: Two times a day (BID) | INTRAVENOUS | Status: DC
  Administered 2022-01-02 – 2022-01-03 (×3): 1000 mg via INTRAVENOUS
  Filled 2022-01-02 (×3): qty 100

## 2022-01-02 MED ORDER — VANCOMYCIN HCL 2000 MG/400ML IV SOLN
2000.0000 mg | Freq: Once | INTRAVENOUS | Status: AC
  Administered 2022-01-02: 2000 mg via INTRAVENOUS
  Filled 2022-01-02: qty 400

## 2022-01-02 MED ORDER — POLYETHYLENE GLYCOL 3350 17 G PO PACK: 17.0000 g | PACK | Freq: Every day | ORAL | Status: DC | PRN

## 2022-01-02 MED ORDER — VANCOMYCIN VARIABLE DOSE PER UNSTABLE RENAL FUNCTION (PHARMACIST DOSING): Status: DC

## 2022-01-02 MED ORDER — HEPARIN (PORCINE) 25000 UT/250ML-% IV SOLN
1800.0000 [IU]/h | INTRAVENOUS | Status: DC
  Filled 2022-01-02: qty 250

## 2022-01-02 MED ORDER — VANCOMYCIN HCL 1250 MG/250ML IV SOLN
1250.0000 mg | Freq: Two times a day (BID) | INTRAVENOUS | Status: DC
  Administered 2022-01-02 – 2022-01-04 (×4): 1250 mg via INTRAVENOUS
  Filled 2022-01-02 (×5): qty 250

## 2022-01-02 MED ORDER — FENTANYL CITRATE PF 50 MCG/ML IJ SOSY
50.0000 ug | PREFILLED_SYRINGE | INTRAMUSCULAR | Status: AC | PRN
  Administered 2022-01-03 – 2022-01-04 (×3): 50 ug via INTRAVENOUS
  Filled 2022-01-02 (×3): qty 1

## 2022-01-02 MED ORDER — MAGNESIUM SULFATE 2 GM/50ML IV SOLN
2.0000 g | Freq: Once | INTRAVENOUS | Status: AC
  Administered 2022-01-02: 2 g via INTRAVENOUS
  Filled 2022-01-02: qty 50

## 2022-01-02 MED ORDER — DOCUSATE SODIUM 50 MG/5ML PO LIQD
100.0000 mg | Freq: Two times a day (BID) | ORAL | Status: DC
  Administered 2022-01-02 – 2022-01-03 (×4): 100 mg
  Filled 2022-01-02 (×4): qty 10

## 2022-01-02 MED ORDER — FENTANYL CITRATE PF 50 MCG/ML IJ SOSY
50.0000 ug | PREFILLED_SYRINGE | INTRAMUSCULAR | Status: DC | PRN
  Administered 2022-01-02 – 2022-01-03 (×4): 100 ug via INTRAVENOUS
  Filled 2022-01-02 (×6): qty 2

## 2022-01-02 MED ORDER — ADULT MULTIVITAMIN LIQUID CH
15.0000 mL | Freq: Every day | ORAL | Status: DC
  Administered 2022-01-02: 15 mL
  Filled 2022-01-02 (×2): qty 15

## 2022-01-02 MED ORDER — PROPOFOL 1000 MG/100ML IV EMUL
0.0000 ug/kg/min | INTRAVENOUS | Status: DC
  Administered 2022-01-02: 10 ug/kg/min via INTRAVENOUS
  Administered 2022-01-02 (×2): 15 ug/kg/min via INTRAVENOUS
  Administered 2022-01-03: 30 ug/kg/min via INTRAVENOUS
  Administered 2022-01-03: 25 ug/kg/min via INTRAVENOUS
  Filled 2022-01-02 (×6): qty 100

## 2022-01-02 MED ORDER — IOHEXOL 350 MG/ML SOLN
75.0000 mL | Freq: Once | INTRAVENOUS | Status: AC | PRN
  Administered 2022-01-02: 75 mL via INTRAVENOUS

## 2022-01-02 MED ORDER — DOCUSATE SODIUM 50 MG/5ML PO LIQD: 100.0000 mg | Freq: Two times a day (BID) | ORAL | Status: DC | PRN

## 2022-01-02 MED ORDER — SODIUM CHLORIDE 0.9 % IV SOLN
250.0000 mL | INTRAVENOUS | Status: DC
  Administered 2022-01-07: 250 mL via INTRAVENOUS

## 2022-01-02 MED ORDER — ONDANSETRON HCL 4 MG/2ML IJ SOLN
4.0000 mg | Freq: Four times a day (QID) | INTRAMUSCULAR | Status: DC | PRN
  Administered 2022-01-03: 4 mg via INTRAVENOUS
  Filled 2022-01-02 (×2): qty 2

## 2022-01-02 MED ORDER — POTASSIUM CHLORIDE 10 MEQ/50ML IV SOLN
10.0000 meq | INTRAVENOUS | Status: AC
  Administered 2022-01-02 (×4): 10 meq via INTRAVENOUS
  Filled 2022-01-02 (×4): qty 50

## 2022-01-02 MED ORDER — FAMOTIDINE 20 MG PO TABS
20.0000 mg | ORAL_TABLET | Freq: Two times a day (BID) | ORAL | Status: DC
  Administered 2022-01-02 (×3): 20 mg
  Filled 2022-01-02 (×3): qty 1

## 2022-01-02 NOTE — Progress Notes (Incomplete)
Echocardiogram attempted, patient is going to MRI and CT  Ronny Flurry 01/02/2022, 3:03 PM

## 2022-01-02 NOTE — Progress Notes (Signed)
Lower extremity venous has been completed.   Preliminary results in CV Proc.   Andrew Haley 01/02/2022 10:37 AM

## 2022-01-02 NOTE — Progress Notes (Signed)
ANTICOAGULATION CONSULT NOTE   Pharmacy Consult for heparin Indication: rule out pulmonary embolus  No Known Allergies  Patient Measurements: Height: '6\' 2"'$  (188 cm) Weight: 120 kg (264 lb 8.8 oz) IBW/kg (Calculated) : 82.2  Heparin Dosing Weight: 107 kg  Vital Signs: Temp: 100.4 F (38 C) (11/23 1400) Temp Source: Bladder (11/23 0600) BP: 120/74 (11/23 1400) Pulse Rate: 68 (11/23 1400)  Labs: Recent Labs    01/01/22 2002 01/01/22 2049 01/02/22 0028 01/02/22 0730 01/02/22 1406  HGB 13.5  --  10.5*  --   --   HCT 43.2  --  31.0*  --   --   PLT 415*  --   --   --   --   APTT  --  23*  --   --   --   LABPROT  --  13.3  --   --   --   INR  --  1.0  --   --   --   HEPARINUNFRC  --   --   --   --  0.10*  CREATININE 1.28*  --   --  0.89  --   TROPONINIHS 12  --   --   --   --     Estimated Creatinine Clearance: 126 mL/min (by C-G formula based on SCr of 0.89 mg/dL).  Medical History: Past Medical History:  Diagnosis Date   Arthritis    Class 1 obesity 15/83/0940   Complication of anesthesia    slow to wake up   Essential hypertension 07/18/2020   Headache    Hypertension    Hypertriglyceridemia    Numbness of fingers of both hands    Prostate cancer ([place]) 2017   Sleep apnea    CPAP    Assessment: 56 YO M with 1 week hx of LLE swelling, brought in by EMS after being found down. Intubated in the ED.  Pharmacy consulted to start heparin for rule-out PE.  Noted CT scan with filling defects but no definitive PE.  Repeat CT pending.  Per neurology notes, concerned for stroke. Patient heading to MRI this afternoon. Heparin was continued from [facility] at 1600 units/hr. Confirmed with RN, no issues or interruptions with heparin infusion. Current heparin level < 0.1. Will avoid bolus until stroke is ruled out.  Goal of Therapy:  Heparin level 0.3-0.7 units/ml Monitor platelets by anticoagulation protocol: Yes   Plan:  Increase heparin infusion to 1800 units/hr Check  heparin level in 6 hours and daily while on heparin Continue to monitor H&H and platelets    Thank you for allowing pharmacy to be a part of this patient's care.  Ardyth Harps, PharmD Clinical Pharmacist

## 2022-01-02 NOTE — Progress Notes (Signed)
Pharmacy Antibiotic Note  Andrew Haley is a 56 y.o. male admitted on 01/01/2022 with seizures; also w/ concern for infection w/ known h/o MRSA colonization. Pharmacy has been consulted for vancomycin dosing. Patient had AKI on admission, baseline SCr ~0.75 (last record ~54yrago), up at 1.3. Renal function now improved. Vancomycin loading dose completed this morning.  Plan: Vancomycin '1250mg'$  IV Q12h (eAUC 449, goal AUC 400-550, Scr 0.89, Vd 0.5) Ceftriaxone and azithromycin per MD Trend WBC, fever, renal function F/u cultures, clinical progress, levels as indicated De-escalate when able   Height: '6\' 2"'$  (188 cm) Weight: 120 kg (264 lb 8.8 oz) IBW/kg (Calculated) : 82.2  Temp (24hrs), Avg:99.1 F (37.3 C), Min:96.8 F (36 C), Max:100.2 F (37.9 C)  Recent Labs  Lab 01/01/22 2002 01/01/22 2049 01/02/22 0730  WBC 17.0*  --   --   CREATININE 1.28*  --  0.89  LATICACIDVEN  --  1.3  --     Estimated Creatinine Clearance: 126 mL/min (by C-G formula based on SCr of 0.89 mg/dL).    No Known Allergies  Microbiology results: 11/23 BCx: pending 11/23 Sputum: GPCs, GNRs    Thank you for allowing pharmacy to be a part of this patient's care.  AArdyth Harps PharmD Clinical Pharmacist

## 2022-01-02 NOTE — Consult Note (Signed)
NEUROLOGY CONSULTATION NOTE   Date of service: January 02, 2022 Patient Name: Andrew Haley MRN:  [id number] DOB:  Jul 09, 1965 Reason for consult: "Status epilepticus" Requesting Provider: Candee Furbish, MD _ _ _   _ __   _ __ _ _  __ __   _ __   __ _  History of Present Illness  Andrew Haley is a 56 y.o. male with PMH significant for alcohol use, hypertriglyceridemia, hypertension, obesity, OSA, chronic pain in his left ankle after an injury and on prescribed opioids who was essentially found on the ground.  EMS arrived and found him tachycardic, breathing slowly.  He was given Narcan with some improvement.  He was taken to Andrew Haley, ED and was able to talk to the ED team and denied taking any increased oxycodone.  In the ED, he was noted to be short of breath and differential considerations included PE versus postictal event given his significant alcohol history.  Work-up with CT PE which is equivocal for a ? Segmental PE.  ABG with significant acidosis.  While in the ED, patient had a 3-minute generalized tonic-clonic seizure and required 2 mg of Ativan and ended up being intubated for apnea and no gag reflex.  He was given Keppra '1000mg'$  IV once and started on propofol drip and transferred to Andrew Haley neuro ICU.  labs with UDS positive for cocaine, EtOH levels undetectable.  Chemistry notable for creatinine 1.28 which is elevated from his baseline.  CBC with leukocytosis.  ROS   Unable to obtain due to review of systems secondary to intubation and obtunded mentation.  Past History   Past Medical History:  Diagnosis Date   Arthritis    Class 1 obesity 34/74/2595   Complication of anesthesia    slow to wake up   Essential hypertension 07/18/2020   Headache    Hypertension    Hypertriglyceridemia    Numbness of fingers of both hands    Prostate cancer ([place]) 2017   Sleep apnea    CPAP   Past Surgical History:  Procedure Laterality Date   ANKLE SURGERY Left    COLONOSCOPY  WITH PROPOFOL N/A 09/28/2017   Procedure: COLONOSCOPY WITH PROPOFOL;  Surgeon: Daneil Dolin, MD;  [facility];  Service: Endoscopy;  Laterality: N/A;  12:00pm   HAND SURGERY Right 2003   cysts    JOINT REPLACEMENT     LESION REMOVAL Left 09/23/2013   Procedure: MINOR EXCISION 3 CM SKIN LESION OF LEFT THIGH ;  Surgeon: Jamesetta So, MD;  Location: [facility] ORS;  Service: General;  Laterality: Left;   LYMPHADENECTOMY Bilateral 11/29/2015   Procedure: LYMPHADENECTOMY;  Surgeon: Raynelle Bring, MD;  [facility];  Service: Urology;  Laterality: Bilateral;   ROBOT ASSISTED LAPAROSCOPIC RADICAL PROSTATECTOMY N/A 11/29/2015   Procedure: XI ROBOTIC ASSISTED LAPAROSCOPIC RADICAL PROSTATECTOMY LEVEL 3;  Surgeon: Raynelle Bring, MD;  [facility];  Service: Urology;  Laterality: N/A;   TOTAL KNEE ARTHROPLASTY Left 02/22/2014   Procedure: LEFT TOTAL KNEE ARTHROPLASTY;  Surgeon: Tobi Bastos, MD;  [facility];  Service: Orthopedics;  Laterality: Left;   TOTAL KNEE ARTHROPLASTY Right 05/23/2014   Procedure: RIGHT TOTAL KNEE ARTHROPLASTY;  Surgeon: Latanya Maudlin, MD;  [facility];  Service: Orthopedics;  Laterality: Right;   TOTAL KNEE REVISION Right 03/04/2021   Procedure: TOTAL KNEE REVISION;  Surgeon: Paralee Cancel, MD;  [facility];  Service: Orthopedics;  Laterality: Right;   Family History  Problem Relation Age of Onset   Heart disease Mother    Throat cancer Father    Colon cancer Neg Hx    Colon polyps Neg Hx    Social History   Socioeconomic History   Marital status: Single    Spouse name: Not on file   Number of children: Not on file   Years of education: Not on file   Highest education level: Not on file  Occupational History   Occupation: disability  Tobacco Use   Smoking status: Never   Smokeless tobacco: Never  Vaping Use   Vaping Use: Never used  Substance and Sexual Activity   Alcohol use: Yes    Comment: 5-6 beers daily   Drug use: Not  Currently    Types: Cocaine    Comment: 1 week ago   Sexual activity: Yes    Birth control/protection: None  Other Topics Concern   Not on file  Social History Narrative   Not on file   Social Determinants of Health   Financial Resource Strain: Not on file  Food Insecurity: Not on file  Transportation Needs: Not on file  Physical Activity: Not on file  Stress: Not on file  Social Connections: Not on file   No Known Allergies  Medications   Medications Prior to Admission  Medication Sig Dispense Refill Last Dose   acetaminophen (TYLENOL) 325 MG tablet Take 3 tablets (975 mg total) by mouth every 6 (six) hours.      cyclobenzaprine (FLEXERIL) 10 MG tablet Take 1 tablet (10 mg total) by mouth 3 (three) times daily as needed for muscle spasms. 30 tablet 0    docusate sodium (COLACE) 100 MG capsule Take 1 capsule (100 mg total) by mouth 2 (two) times daily. 10 capsule 0    ferrous sulfate 325 (65 FE) MG tablet Take 1 tablet (325 mg total) by mouth 3 (three) times daily after meals for 14 days. 42 tablet 0    losartan (COZAAR) 50 MG tablet Take 50 mg by mouth daily.      losartan-hydrochlorothiazide (HYZAAR) 50-12.5 MG tablet Take 1 tablet by mouth daily. (Patient not taking: Reported on 11/16/2020) 30 tablet 11    naloxone (NARCAN) nasal spray 4 mg/0.1 mL Spray into nostrils if patient shows signs of opioid overdose, then call 911 1 each 0    oxyCODONE (ROXICODONE) 5 MG immediate release tablet Take 1-3 tablets (5-15 mg total) by mouth every 4 (four) hours as needed for severe pain. Do NOT take with normal Oxycodone prescription at home. 60 tablet 0    polyethylene glycol (MIRALAX / GLYCOLAX) 17 g packet Take 17 g by mouth daily as needed for mild constipation. 14 each 0      Vitals   Vitals:   01/01/22 2230 01/01/22 2338 01/01/22 2346 01/02/22 0000  BP: 125/81  107/70 106/66  Pulse: 68  66 74  Resp: (!) 22  (!) 23 20  Temp: 98.2 F (36.8 C)  98.3 F (36.8 C)   TempSrc:   Oral    SpO2: 100% 100% 100% 100%  Weight:   116.5 kg   Height:         Body mass index is 32.98 kg/m.  Physical Exam   General: Laying comfortably in bed; intubated HENT: Normal oropharynx and mucosa. Normal external appearance of ears and nose.  Neck: Supple, no pain or tenderness  CV: No JVD. No peripheral edema.  Pulmonary: Symmetric Chest rise.  Breathing over vent. Abdomen: Soft  to touch, non-tender.  Ext: No cyanosis, edema, or deformity  Skin: No rash. Normal palpation of skin.   Musculoskeletal: Normal digits and nails by inspection. No clubbing.   Neurologic Examination on propofol 30  Mental status/Cognition: No response to voice or to loud clap.  Questionable facial grimace noted to vigorous tactile stimulation. Speech/language: None, mute. Obtunded mentation limits evaluation of speech.   Cranial nerves:   CN II Pupils pinpoint bilaterally and reactive to light, unable to assess for visual field deficit.   CN III,IV,VI EOM intact to dolls eyes, no gaze preference or deviation.   CN V Corneals weakly positive BL   CN VII Symmetric upper facial grimace noted to nares stimulation   CN VIII Does not turn head towards speech.   CN IX & X Unable to elicit a gag, cough is intact.   CN XI Head is midline.   CN XII midline tongue but does not protrude on command.   Motor/sensory:  Muscle bulk: normal, tone flaccid in all extremities. Limited motor strength evaluation secondary obtunded mentation. No response to mild pinch in any of the extremities. To Babinski's reflex, he will withdraw bilateral lower extremities.  Reflexes:  Right Left Comments  Pectoralis      Biceps (C5/6) 2 2   Brachioradialis (C5/6) 2 2    Triceps (C6/7) 2 2    Patellar (L3/4) 1 1    Achilles (S1) 0 0    Hoffman      Plantar Down and withdraws Down and withdraws   Jaw jerk    Coordination/Complex Motor:  Unable to assess. Labs   CBC:  Recent Labs  Lab 01/01/22 2002 01/02/22 0028  WBC  17.0*  --   HGB 13.5 10.5*  HCT 43.2 31.0*  MCV 89.4  --   PLT 415*  --     Basic Metabolic Panel:  Lab Results  Component Value Date   NA 140 01/02/2022   K 3.3 (L) 01/02/2022   CO2 30 01/01/2022   GLUCOSE 256 (H) 01/01/2022   BUN 22 (H) 01/01/2022   CREATININE 1.28 (H) 01/01/2022   CALCIUM 9.1 01/01/2022   GFRNONAA >60 01/01/2022   GFRAA >60 09/23/2017   Lipid Panel: No results found for: "[place]" HgbA1c: No results found for: "HGBA1C" Urine Drug Screen:     Component Value Date/Time   LABOPIA NONE DETECTED 01/01/2022 2115   COCAINSCRNUR POSITIVE (A) 01/01/2022 2115   LABBENZ NONE DETECTED 01/01/2022 2115   AMPHETMU NONE DETECTED 01/01/2022 2115   THCU NONE DETECTED 01/01/2022 2115   LABBARB NONE DETECTED 01/01/2022 2115    Alcohol Level     Component Value Date/Time   ETH <10 01/01/2022 2002    CT Head without contrast: pending  MRI Brain: pending  cEEG:  pending  Impression   WRAY GOEHRING is a 56 y.o. male with PMH significant for alcohol use, hypertriglyceridemia, hypertension, obesity, OSA, chronic pain in his left ankle after an injury and on prescribed opioids who was essentially found on the ground.  EMS arrived and found him tachycardic, breathing slowly.  He was given Narcan with some improvement. Had a 3 mins GTC in the ED and intubated for apnea and hypoxia. CT equivocal for ? Segmental PE.  Neurology consulted for further workup and evaluation for potential subclinical seizures vs non convulsive status epilepticus.  Etiology of earlier noted seizure in the ED unclear, very well could have been a withdrawal seizures since EtOH levels were undetectable, could also have  been provoked in the setting of cocaine use. Will need to clarify history when he is more awake.  Given the concern for PE and obtunded mentation which is out of proportion to what I would expect from just 30 of propofol, will add CT Angio head and neck to rule out basilar  thrombus.  Recommendations  - STAT CTH w/o contrast - CT Angio head and neck w + w/o C - STAT cEEG with MRI safe leads to rule out seizures/status epilepticus. - Further AEDs based on cEEG. ______________________________________________________________________  This patient is critically ill and at significant risk of neurological worsening, death and care requires constant monitoring of vital signs, hemodynamics,respiratory and cardiac monitoring, neurological assessment, discussion with family, other specialists and medical decision making of high complexity. I spent 40 minutes of neurocritical care time  in the care of  this patient. This was time spent independent of any time provided by nurse practitioner or PA.  Andrew Haley [facility] Pager Number [phone] 01/02/2022  1:04 AM  Thank you for the opportunity to take part in the care of this patient. If you have any further questions, please contact the neurology consultation attending.  Signed,  Andrew Haley Pager Number [phone] _ _ _   _ __   _ __ _ _  __ __   _ __   __ _

## 2022-01-02 NOTE — Progress Notes (Addendum)
NAME:  Andrew Haley, MRN:  [id number], DOB:  [birth year], LOS: 1 ADMISSION DATE:  01/01/2022, CONSULTATION DATE:  01/02/22 REFERRING MD:  Sabra Heck, CHIEF COMPLAINT:  AMS   History of Present Illness:  Andrew Haley is a 55 y.o. M with PMH of obesity, ETOH and polysubstance abuse, HTN, prostate Ca and OSA who presented to [facility] ED [place].  EMS found patient tachycardic with decreased respirations, Narcan was administered which improved mental status marginally.  He has recently been prescribed oxycodone for L ankle chronic pain, but denied taking more than prescribed.  He also noted significant LLE edema and has recently been taking Lasix.  He seized in the ED and  was significantly hypercarbic in the ED and was intubated.  Work-up significant for WBC 17k, Creatinine 1.2, ETOH <10, CT chest without clear PE, patchy opacities bilaterally and 4.8cm ascending thoracic aortic aneurysm.  PCCM consult and transfer to [facility]  Pertinent  Medical History   has a past medical history of Arthritis, Class 1 obesity (14/48/1856), Complication of anesthesia, Essential hypertension (07/18/2020), Headache, Hypertension, Hypertriglyceridemia, Numbness of fingers of both hands, Prostate cancer ([place]) (2017), and Sleep apnea.   Significant Hospital Events: Including procedures, antibiotic start and stop dates in addition to other pertinent events   11/22 presented to [facility] ED, AMS, Sz, PNA intubated. Transfer to [facility]  Interim History / Subjective:   Remains on the ventilator.  No acute events  Objective   Blood pressure 120/71, pulse 73, temperature 100 F (37.8 C), resp. rate 20, height '6\' 2"'$  (1.88 m), weight 120 kg, SpO2 98 %.    Vent Mode: PRVC FiO2 (%):  [40 %-100 %] 40 % Set Rate:  [20 bmp-26 bmp] 22 bmp Vt Set:  [600 mL-660 mL] 600 mL PEEP:  [5 cmH20] 5 cmH20 Plateau Pressure:  [19 cmH20-22 cmH20] 19 cmH20   Intake/Output Summary (Last 24 hours) at 01/02/2022  0836 Last data filed at 01/02/2022 0700 Gross per 24 hour  Intake 3749.84 ml  Output 775 ml  Net 2974.84 ml   Filed Weights   01/01/22 2005 01/01/22 2346 01/02/22 0600  Weight: 117.5 kg 116.5 kg 120 kg   Examination: Gen:      No acute distress HEENT:  EOMI, sclera anicteric, ETT Neck:     No masses; no thyromegaly Lungs:    Clear to auscultation bilaterally; normal respiratory effort CV:         Regular rate and rhythm; no murmurs Abd:      + bowel sounds; soft, non-tender; no palpable masses, no distension Ext:   1+ leg edema; adequate peripheral perfusion Skin:      Warm and dry; no rash Neuro: Sedated  Labs/imaging reviewed Significant for potassium 3.4, creatinine 0.89 WBC 17, platelets 415  Resolved Hospital Problem list     Assessment & Plan:   Acute Encephalopathy Seizure activity Acute hypercarbic respiratory failure History substance abuse Urine drug screen is positive for cocaine, ETOH <10 Possible withdrawal seizure  Monitor in ICU, CT head reviewed with no acute intracranial abnormality Continue EEG monitoring  Acute respiratory failure due to altered mental status CAP vs Aspiration Sepsis present on admission Wean down pressors Lactic acid WNL Continue antibiotics for community-acquired pneumonia coverage SBT's when mental status improves. Questionable filling defect on PE protocol study with suboptimal bolus Checking lower extremity duplex and echocardiogram.  Lower extremity edema left greater than right Rule out DVT  History HTN -hold  home [place] (right click and "Reselect all SmartList Selections" daily)   Diet/type: NPO DVT prophylaxis: prophylactic heparin  GI prophylaxis: PPI Lines: Central line Foley:  Yes, and it is still needed Code Status:  full code Last date of multidisciplinary goals of care discussion [pending]  Critical care time:     The patient is critically ill with multiple organ system failure and  requires high complexity decision making for assessment and support, frequent evaluation and titration of therapies, advanced monitoring, review of radiographic studies and interpretation of complex data.   Critical Care Time devoted to patient care services, exclusive of separately billable procedures, described in this note is 35 minutes.   Marshell Garfinkel MD [place] [facility] See Amion for pager  If no response to pager , please call [phone] until 7pm After 7:00 pm call Elink  [phone] 01/02/2022, 8:36 AM

## 2022-01-02 NOTE — Procedures (Signed)
Patient Name: Andrew Haley  MRN: [id number]  Epilepsy Attending: Lora Havens  Referring Physician/Provider: Donnetta Simpers, MD  Duration: 01/02/2022 4982 to 01/03/2022 6415  Patient history: 56yo M with ams. EEG to evaluate for seizure  Level of alertness: comatose-->awake, asleep  AEDs during EEG study: LEV, propofol  Technical aspects: This EEG study was done with scalp electrodes positioned according to the 10-20 International system of electrode placement. Electrical activity was reviewed with band pass filter of 1-'70Hz'$ , sensitivity of 7 uV/mm, display speed of 3m/sec with a '60Hz'$  notched filter applied as appropriate. EEG data were recorded continuously and digitally stored.  Video monitoring was available and reviewed as appropriate.  Description: At the beginning of study,  EEG showed continuous generalized 5 to 9 Hz theta-alpha activity admixed with intermittent generalized 2-'3hz'$  slowing. As sedation was weaned EEG showed posterior dominant rhythm of 8 Hz activity of moderate voltage (25-35 uV) seen predominantly in posterior head regions, symmetric and reactive to eye opening and eye closing. Sleep was characterized by sleep spindles (12-'14hz'$ ), maixmal fronto-central region/ Hyperventilation and photic stimulation were not performed.      ABNORMALITY - Continuous slow, generalized   IMPRESSION: This study is suggestive of moderate diffuse encephalopathy, nonspecific etiology. No seizures or epileptiform discharges were seen throughout the recording.   Jelene Albano OBarbra Sarks

## 2022-01-02 NOTE — Progress Notes (Signed)
Pharmacy Antibiotic Note  Andrew Haley is a 56 y.o. male admitted on 01/01/2022 with seizures; also w/ concern for infection w/ known h/o MRSA colonization .  Pharmacy has been consulted for vancomycin dosing.  Pt w/ AKI; baseline SCr ~0.75 (last record ~45yrago), current 1.3.  Plan: Vancomycin 2g IV x1; f/u SCr prior to redosing.  Height: '6\' 2"'$  (188 cm) Weight: 116.5 kg (256 lb 13.4 oz) IBW/kg (Calculated) : 82.2  Temp (24hrs), Avg:97.8 F (36.6 C), Min:96.8 F (36 C), Max:98.3 F (36.8 C)  Recent Labs  Lab 01/01/22 2002 01/01/22 2049  WBC 17.0*  --   CREATININE 1.28*  --   LATICACIDVEN  --  1.3    Estimated Creatinine Clearance: 86.4 mL/min (A) (by C-G formula based on SCr of 1.28 mg/dL (H)).    No Known Allergies   Thank you for allowing pharmacy to be a part of this patient's care.  VWynona Neat PharmD, BCPS  01/02/2022 12:43 AM

## 2022-01-02 NOTE — TOC CAGE-AID Note (Signed)
Transition of Care [name]) - CAGE-AID Screening   Patient Details  Name: Andrew Haley MRN: [id number] Date of Birth: 05-Jul-1965  Transition of Care [facility]) CM/SW Contact:    Ella Bodo, RN Phone Number: 01/02/2022, 12:41 PM   Clinical Narrative: Patient currently intubated and unable to participate in SA screening.    CAGE-AID Screening: Substance Abuse Screening unable to be completed due to: : Patient unable to participate     Reinaldo Raddle, RN, BSN  Trauma/Neuro ICU Case Manager [phone]

## 2022-01-02 NOTE — Progress Notes (Signed)
Pt's morning labs inadvertently sent down w/o labels. This RN re-collecting these labs at this time.

## 2022-01-02 NOTE — Progress Notes (Signed)
RT and and RN transported vent patient to CT and back to 4N. Vital signs stable through out.

## 2022-01-02 NOTE — Plan of Care (Signed)
Patient received on PRVC vent settings as charted. No weaning done today as patient on continuous EEG and not following commands at this time. Patient transported from 4N to CT scan and on to MRI on the ventilator. RN bedside during transport.

## 2022-01-02 NOTE — Progress Notes (Signed)
Please see note by Dr. Lorrin Goodell of the same day.  He is not currently seizing on EEG.  On exam, he has improved, he is able to follow commands in all four extremities, nursing reports he is moving his left side less than his right.  He does withdrawal all but does not cooperate with formal testing.  When testing visual fields, he endorses being able to see things in the right hemifield, but appears to have a left hemianopia.  I am concerned about possible stroke, CTA was negative so no acute intervention, but I do think that an MRI would be prudent.  Seizure likely provoked secondary to cocaine use, though certainly sepsis could contribute as well.  1) continue EEG overnight again tonight 2) MRI brain 3) neurology will continue to follow  Roland Rack, MD [facility] [phone]  If 7pm- 7am, please page neurology on call as listed in [place].

## 2022-01-02 NOTE — Progress Notes (Signed)
[facility] Progress Note Patient Name: Andrew Haley DOB: 05-09-1965 MRN: [id number]   Date of Service  01/02/2022  HPI/Events of Note  Received inquiry from pharmacy regarding need to continue heparin drip as repeat CTA without PE and LE dopplers without DVT. Troponin WNL  eICU Interventions  Ok to discontinue heparin drip and switch to prophylactic dose     Intervention Category Intermediate Interventions: Other:  [name] 01/02/2022, 11:36 PM

## 2022-01-02 NOTE — Progress Notes (Signed)
LTM EEG hooked up and running - no initial skin breakdown - push button tested - [facility] monitoring.  

## 2022-01-02 NOTE — Progress Notes (Signed)
Pt transported from MRI 1 back to 4N16 with RN and transport at bedside with no complications.

## 2022-01-03 ENCOUNTER — Inpatient Hospital Stay (HOSPITAL_COMMUNITY): Payer: Medicare HMO

## 2022-01-03 DIAGNOSIS — J9601 Acute respiratory failure with hypoxia: Secondary | ICD-10-CM | POA: Diagnosis not present

## 2022-01-03 DIAGNOSIS — G40901 Epilepsy, unspecified, not intractable, with status epilepticus: Secondary | ICD-10-CM | POA: Diagnosis not present

## 2022-01-03 LAB — CBC
HCT: 30.2 % — ABNORMAL LOW (ref 39.0–52.0)
Hemoglobin: 9.7 g/dL — ABNORMAL LOW (ref 13.0–17.0)
MCH: 27.9 pg (ref 26.0–34.0)
MCHC: 32.1 g/dL (ref 30.0–36.0)
MCV: 86.8 fL (ref 80.0–100.0)
Platelets: 197 10*3/uL (ref 150–400)
RBC: 3.48 MIL/uL — ABNORMAL LOW (ref 4.22–5.81)
RDW: 13.1 % (ref 11.5–15.5)
WBC: 7.6 10*3/uL (ref 4.0–10.5)
nRBC: 0 % (ref 0.0–0.2)

## 2022-01-03 LAB — LEGIONELLA PNEUMOPHILA SEROGP 1 UR AG: L. pneumophila Serogp 1 Ur Ag: NEGATIVE

## 2022-01-03 LAB — COMPREHENSIVE METABOLIC PANEL
ALT: 14 U/L (ref 0–44)
AST: 23 U/L (ref 15–41)
Albumin: 2.7 g/dL — ABNORMAL LOW (ref 3.5–5.0)
Alkaline Phosphatase: 46 U/L (ref 38–126)
Anion gap: 12 (ref 5–15)
BUN: 15 mg/dL (ref 6–20)
CO2: 26 mmol/L (ref 22–32)
Calcium: 8.6 mg/dL — ABNORMAL LOW (ref 8.9–10.3)
Chloride: 103 mmol/L (ref 98–111)
Creatinine, Ser: 0.9 mg/dL (ref 0.61–1.24)
GFR, Estimated: >60 mL/min (ref >60)
Glucose, Bld: 90 mg/dL (ref 70–99)
Potassium: 3.3 mmol/L — ABNORMAL LOW (ref 3.5–5.1)
Sodium: 141 mmol/L (ref 135–145)
Total Bilirubin: 0.4 mg/dL (ref 0.3–1.2)
Total Protein: 5.5 g/dL — ABNORMAL LOW (ref 6.5–8.1)

## 2022-01-03 LAB — ECHOCARDIOGRAM COMPLETE
AR max vel: 2.1 cm2
AV Area VTI: 2.14 cm2
AV Area mean vel: 2.09 cm2
AV Mean grad: 8 mmHg
AV Peak grad: 15.7 mmHg
Ao pk vel: 1.98 m/s
Area-P 1/2: 3.27 cm2
Calc EF: 52.3 %
Height: 74 in
S' Lateral: 3.3 cm
Single Plane A2C EF: 52.5 %
Single Plane A4C EF: 51.6 %
Weight: 4321.02 oz

## 2022-01-03 LAB — GLUCOSE, CAPILLARY
Glucose-Capillary: 100 mg/dL — ABNORMAL HIGH (ref 70–99)
Glucose-Capillary: 134 mg/dL — ABNORMAL HIGH (ref 70–99)
Glucose-Capillary: 144 mg/dL — ABNORMAL HIGH (ref 70–99)
Glucose-Capillary: 77 mg/dL (ref 70–99)
Glucose-Capillary: 86 mg/dL (ref 70–99)
Glucose-Capillary: 87 mg/dL (ref 70–99)
Glucose-Capillary: 88 mg/dL (ref 70–99)

## 2022-01-03 LAB — MAGNESIUM: Magnesium: 2 mg/dL (ref 1.7–2.4)

## 2022-01-03 LAB — HEMOGLOBIN A1C
Hgb A1c MFr Bld: 5.4 % (ref 4.8–5.6)
Mean Plasma Glucose: 108 mg/dL

## 2022-01-03 LAB — PHOSPHORUS: Phosphorus: 3.3 mg/dL (ref 2.5–4.6)

## 2022-01-03 LAB — URINE CULTURE: Culture: NO GROWTH

## 2022-01-03 LAB — TRIGLYCERIDES: Triglycerides: 60 mg/dL (ref <150)

## 2022-01-03 MED ORDER — FOLIC ACID 1 MG PO TABS
1.0000 mg | ORAL_TABLET | Freq: Every day | ORAL | Status: DC
  Administered 2022-01-03: 1 mg
  Filled 2022-01-03: qty 1

## 2022-01-03 MED ORDER — ACETAMINOPHEN 325 MG PO TABS: 650.0000 mg | ORAL_TABLET | ORAL | Status: DC | PRN

## 2022-01-03 MED ORDER — ADULT MULTIVITAMIN W/MINERALS CH
1.0000 | ORAL_TABLET | Freq: Every day | ORAL | Status: DC
  Administered 2022-01-04 – 2022-01-10 (×5): 1 via ORAL
  Filled 2022-01-03 (×5): qty 1

## 2022-01-03 MED ORDER — CEFAZOLIN SODIUM-DEXTROSE 2-4 GM/100ML-% IV SOLN
2.0000 g | Freq: Three times a day (TID) | INTRAVENOUS | Status: AC
  Administered 2022-01-03 – 2022-01-07 (×13): 2 g via INTRAVENOUS
  Filled 2022-01-03 (×13): qty 100

## 2022-01-03 MED ORDER — FOLIC ACID 1 MG PO TABS
1.0000 mg | ORAL_TABLET | Freq: Every day | ORAL | Status: DC
  Administered 2022-01-04 – 2022-01-10 (×5): 1 mg via ORAL
  Filled 2022-01-03 (×5): qty 1

## 2022-01-03 MED ORDER — ACETAMINOPHEN 325 MG PO TABS
650.0000 mg | ORAL_TABLET | ORAL | Status: DC | PRN
  Administered 2022-01-03: 650 mg
  Filled 2022-01-03: qty 2

## 2022-01-03 MED ORDER — LOSARTAN POTASSIUM 50 MG PO TABS
100.0000 mg | ORAL_TABLET | Freq: Every day | ORAL | Status: DC
  Administered 2022-01-03: 100 mg
  Filled 2022-01-03: qty 2

## 2022-01-03 MED ORDER — THIAMINE MONONITRATE 100 MG PO TABS
100.0000 mg | ORAL_TABLET | Freq: Every day | ORAL | Status: DC
  Administered 2022-01-04 – 2022-01-10 (×5): 100 mg via ORAL
  Filled 2022-01-03 (×5): qty 1

## 2022-01-03 MED ORDER — LORAZEPAM 2 MG/ML IJ SOLN
1.0000 mg | INTRAMUSCULAR | Status: DC | PRN
  Administered 2022-01-03 – 2022-01-04 (×7): 2 mg via INTRAVENOUS
  Administered 2022-01-04: 1 mg via INTRAVENOUS
  Administered 2022-01-04 – 2022-01-05 (×3): 2 mg via INTRAVENOUS
  Filled 2022-01-03 (×11): qty 1

## 2022-01-03 MED ORDER — POTASSIUM CHLORIDE 10 MEQ/100ML IV SOLN
10.0000 meq | INTRAVENOUS | Status: AC
  Administered 2022-01-03 (×4): 10 meq via INTRAVENOUS
  Filled 2022-01-03 (×4): qty 100

## 2022-01-03 MED ORDER — POLYETHYLENE GLYCOL 3350 17 G PO PACK: 17.0000 g | PACK | Freq: Every day | ORAL | Status: DC | PRN

## 2022-01-03 MED ORDER — ORAL CARE MOUTH RINSE: 15.0000 mL | OROMUCOSAL | Status: DC | PRN

## 2022-01-03 MED ORDER — DEXMEDETOMIDINE HCL IN NACL 400 MCG/100ML IV SOLN
0.4000 ug/kg/h | INTRAVENOUS | Status: DC
  Administered 2022-01-03: 0.4 ug/kg/h via INTRAVENOUS
  Filled 2022-01-03: qty 100

## 2022-01-03 MED ORDER — PERFLUTREN LIPID MICROSPHERE
1.0000 mL | INTRAVENOUS | Status: AC | PRN
  Administered 2022-01-03: 2 mL via INTRAVENOUS

## 2022-01-03 MED ORDER — POLYETHYLENE GLYCOL 3350 17 G PO PACK
17.0000 g | PACK | Freq: Every day | ORAL | Status: DC
  Administered 2022-01-04: 17 g via ORAL
  Filled 2022-01-03: qty 1

## 2022-01-03 MED ORDER — DOCUSATE SODIUM 100 MG PO CAPS: 100.0000 mg | ORAL_CAPSULE | Freq: Two times a day (BID) | ORAL | Status: DC | PRN

## 2022-01-03 MED ORDER — DOCUSATE SODIUM 100 MG PO CAPS
100.0000 mg | ORAL_CAPSULE | Freq: Two times a day (BID) | ORAL | Status: DC
  Administered 2022-01-03 – 2022-01-04 (×2): 100 mg via ORAL
  Filled 2022-01-03 (×3): qty 1

## 2022-01-03 NOTE — Progress Notes (Signed)
[facility] Progress Note Patient Name: Andrew Haley DOB: 12-25-1965 MRN: [id number]   Date of Service  01/03/2022  HPI/Events of Note  RN reports pt febrile, 100.2 bladder temp.  No antipyretics ordered.  Pt has ETOH hx, no liver function labs since admit  On ceftriaxone and azithromycin for pneumonia Cultures NGTD  eICU Interventions  Tylenol 650 per feeding tube prn ordered CMP ordered for tomorrow am     Intervention Category Intermediate Interventions: Other:  [name] 01/03/2022, 1:28 AM

## 2022-01-03 NOTE — Progress Notes (Signed)
EEG maint complete.  ?

## 2022-01-03 NOTE — Progress Notes (Signed)
  Echocardiogram 2D Echocardiogram has been performed.  Andrew Haley 01/03/2022, 1:37 PM

## 2022-01-03 NOTE — Progress Notes (Signed)
Subjective: Patient was extubated this morning  Exam: Vitals:   01/03/22 1022 01/03/22 1100  BP:  (!) 147/86  Pulse: 85 78  Resp: 12 13  Temp: 99.3 F (37.4 C) 99 F (37.2 C)  SpO2: 93% 98%   Gen: In bed, NAD, he is hoarse  Neuro: MS: Awake, alert, oriented to person and time, thought he was at [facility](which is where he was taken originally).  Given that he was just extubated I think this is not very concerning CN: Pupils equal round and reactive, extraocular movements intact, visual fields are full Motor: Moves all extremities well Sensory: Intact to light touch   Pertinent Labs: UDS positive for cocaine  MRI and EEG are negative  Impression: 56 year old male with likely cocaine induced seizure.  Advised him that he is not allowed to drive until released by an outpatient physician, or until 6 months.  Given that this is likely a provoked seizure, I would not favor starting antiepileptic therapy.  Recommendations: 1) no driving for 6 months, this was discussed with the patient who expressed understanding 2) neurology will be available on an as-needed basis, please call with further questions or concerns.  Roland Rack, MD [facility] [phone]  If 7pm- 7am, please page neurology on call as listed in [place].

## 2022-01-03 NOTE — Progress Notes (Signed)
NAME:  Andrew Haley, MRN:  [id number], DOB:  04/20/1965, LOS: 2 ADMISSION DATE:  01/01/2022, CONSULTATION DATE:  01/03/22 REFERRING MD:  Sabra Heck, CHIEF COMPLAINT:  AMS   History of Present Illness:  Andrew Haley is a 56 y.o. M with PMH of obesity, ETOH and polysubstance abuse, HTN, prostate Ca and OSA who presented to [facility] [place].  EMS found patient tachycardic with decreased respirations, Narcan was administered which improved mental status marginally.  He has recently been prescribed oxycodone for L ankle chronic pain, but denied taking more than prescribed.  He also noted significant LLE edema and has recently been taking Lasix.  He seized in the ED and  was significantly hypercarbic in the ED and was intubated.  Work-up significant for WBC 17k, Creatinine 1.2, ETOH <10, CT chest without clear PE, patchy opacities bilaterally and 4.8cm ascending thoracic aortic aneurysm.  PCCM consult and transfer to [facility]  Pertinent  Medical History   has a past medical history of Arthritis, Class 1 obesity (33/35/4562), Complication of anesthesia, Essential hypertension (07/18/2020), Headache, Hypertension, Hypertriglyceridemia, Numbness of fingers of both hands, Prostate cancer ([place]) (2017), and Sleep apnea.   Significant Hospital Events: Including procedures, antibiotic start and stop dates in addition to other pertinent events   11/22 presented to [facility] ED, AMS, Sz, PNA intubated. Transfer to [facility]  Interim History / Subjective:   More awake today.  Weaning on pressure support.  Objective   Blood pressure 125/71, pulse 79, temperature 99.5 F (37.5 C), resp. rate 14, height '6\' 2"'$  (1.88 m), weight 122.5 kg, SpO2 100 %.    Vent Mode: PSV;CPAP FiO2 (%):  [40 %] 40 % Set Rate:  [20 bmp] 20 bmp Vt Set:  [600 mL] 600 mL PEEP:  [5 cmH20] 5 cmH20 Pressure Support:  [5 cmH20] 5 cmH20 Plateau Pressure:  [19 cmH20] 19 cmH20   Intake/Output Summary (Last 24  hours) at 01/03/2022 5638 Last data filed at 01/03/2022 0800 Gross per 24 hour  Intake 3289.34 ml  Output 1400 ml  Net 1889.34 ml   Filed Weights   01/01/22 2346 01/02/22 0600 01/03/22 0500  Weight: 116.5 kg 120 kg 122.5 kg   Examination: Gen:      No acute distress HEENT:  EOMI, sclera anicteric, ET tube Neck:     No masses; no thyromegaly Lungs:    Clear to auscultation bilaterally; normal respiratory effort CV:         Regular rate and rhythm; no murmurs Abd:      + bowel sounds; soft, non-tender; no palpable masses, no distension Ext: Lower extremity edema left greater than right Skin:      Warm and dry; no rash Neuro: Awake, agitated on PSV trials  Labs/imaging reviewed Significant for potassium 3.3 hemoglobin 9.7 CT chest with no PE, bilateral lower lobe consolidation.  Resolved Hospital Problem list     Assessment & Plan:   Acute Encephalopathy Seizure activity Acute hypercarbic respiratory failure History substance abuse Urine drug screen is positive for cocaine, ETOH <10 Possible withdrawal seizure  Monitor in ICU, CT head reviewed with no acute intracranial abnormality No seizures on EEG  Acute respiratory failure due to altered mental status CAP vs Aspiration Sepsis present on admission Wean down pressors Lactic acid WNL Continue antibiotics for community-acquired pneumonia coverage + Vanco SBT's.  May be extubated today No evidence of PE on CT scan  Lower extremity edema left greater than right  No evidence of DVT  Thoracic aortic aneurysm Noted on CT chest during this admission.  Will need monitoring as an outpatient.  History HTN Hold home cozaar  Best Practice (right click and "Reselect all SmartList Selections" daily)   Diet/type: NPO DVT prophylaxis: prophylactic heparin  GI prophylaxis: PPI Lines: Central line Foley:  Yes, and it is still needed Code Status:  full code Last date of multidisciplinary goals of care discussion  [pending]  Critical care time:     The patient is critically ill with multiple organ system failure and requires high complexity decision making for assessment and support, frequent evaluation and titration of therapies, advanced monitoring, review of radiographic studies and interpretation of complex data.   Critical Care Time devoted to patient care services, exclusive of separately billable procedures, described in this note is 35 minutes.   Marshell Garfinkel MD [place] [facility] See Amion for pager  If no response to pager , please call [phone] until 7pm After 7:00 pm call Elink  [phone] 01/03/2022, 8:38 AM

## 2022-01-03 NOTE — Progress Notes (Signed)
EEG D/C'd. No skin breakdown. [facility] was notified.

## 2022-01-03 NOTE — Procedures (Addendum)
Patient Name: Andrew Haley  MRN: [id number]  Epilepsy Attending: Lora Havens  Referring Physician/Provider: Donnetta Simpers, MD  Duration: 01/03/2022 0940 to 01/03/2022 1422   Patient history: 56yo M with ams. EEG to evaluate for seizure   Level of alertness: awake, asleep   AEDs during EEG study: LEV   Technical aspects: This EEG study was done with scalp electrodes positioned according to the 10-20 International system of electrode placement. Electrical activity was reviewed with band pass filter of 1-'70Hz'$ , sensitivity of 7 uV/mm, display speed of 17m/sec with a '60Hz'$  notched filter applied as appropriate. EEG data were recorded continuously and digitally stored.  Video monitoring was available and reviewed as appropriate.   Description: EEG showed posterior dominant rhythm of 8 Hz activity of moderate voltage (25-35 uV) seen predominantly in posterior head regions, symmetric and reactive to eye opening and eye closing. Sleep was characterized by sleep spindles (12-'14hz'$ ), maixmal fronto-central region. EEG also showed continuous generalized 3-'7hz'$  theta-delta slowing. Hyperventilation and photic stimulation were not performed.      ABNORMALITY - Continuous slow, generalized   IMPRESSION: This study is suggestive of moderate diffuse encephalopathy, nonspecific etiology. No seizures or epileptiform discharges were seen throughout the recording.   Preet Mangano OBarbra Sarks

## 2022-01-03 NOTE — Procedures (Signed)
Extubation Procedure Note  Patient Details:   Name: Andrew Haley DOB: Apr 29, 1965 MRN: [id number]   Airway Documentation:    Vent end date: 01/03/22 Vent end time: 0927   Evaluation  O2 sats: currently acceptable Complications: No apparent complications Patient did tolerate procedure well. Bilateral Breath Sounds: Clear, Diminished   Yes Pt was successfully extubated to 5L [place] with no apparent complications. Pt sats dropped to the 80's but Pt is now stable. Audible cuffleak was heard prior extubation and no signs of stridor at this time. RT will continue to monitor as needed.  Felecia Jan 01/03/2022, 9:31 AM

## 2022-01-04 DIAGNOSIS — G40901 Epilepsy, unspecified, not intractable, with status epilepticus: Secondary | ICD-10-CM | POA: Diagnosis not present

## 2022-01-04 LAB — BASIC METABOLIC PANEL
Anion gap: 9 (ref 5–15)
BUN: 8 mg/dL (ref 6–20)
CO2: 28 mmol/L (ref 22–32)
Calcium: 8.8 mg/dL — ABNORMAL LOW (ref 8.9–10.3)
Chloride: 103 mmol/L (ref 98–111)
Creatinine, Ser: 0.65 mg/dL (ref 0.61–1.24)
GFR, Estimated: >60 mL/min (ref >60)
Glucose, Bld: 99 mg/dL (ref 70–99)
Potassium: 3.8 mmol/L (ref 3.5–5.1)
Sodium: 140 mmol/L (ref 135–145)

## 2022-01-04 LAB — CBC
HCT: 32.2 % — ABNORMAL LOW (ref 39.0–52.0)
Hemoglobin: 10.1 g/dL — ABNORMAL LOW (ref 13.0–17.0)
MCH: 27.4 pg (ref 26.0–34.0)
MCHC: 31.4 g/dL (ref 30.0–36.0)
MCV: 87.5 fL (ref 80.0–100.0)
Platelets: 213 10*3/uL (ref 150–400)
RBC: 3.68 MIL/uL — ABNORMAL LOW (ref 4.22–5.81)
RDW: 12.5 % (ref 11.5–15.5)
WBC: 6.8 10*3/uL (ref 4.0–10.5)
nRBC: 0 % (ref 0.0–0.2)

## 2022-01-04 LAB — PHOSPHORUS: Phosphorus: 3.8 mg/dL (ref 2.5–4.6)

## 2022-01-04 LAB — GLUCOSE, CAPILLARY
Glucose-Capillary: 102 mg/dL — ABNORMAL HIGH (ref 70–99)
Glucose-Capillary: 117 mg/dL — ABNORMAL HIGH (ref 70–99)
Glucose-Capillary: 104 mg/dL — ABNORMAL HIGH (ref 70–99)
Glucose-Capillary: 104 mg/dL — ABNORMAL HIGH (ref 70–99)
Glucose-Capillary: 91 mg/dL (ref 70–99)
Glucose-Capillary: 120 mg/dL — ABNORMAL HIGH (ref 70–99)

## 2022-01-04 LAB — MAGNESIUM: Magnesium: 2.1 mg/dL (ref 1.7–2.4)

## 2022-01-04 MED ORDER — ORAL CARE MOUTH RINSE: 15.0000 mL | OROMUCOSAL | Status: DC | PRN

## 2022-01-04 MED ORDER — PHENOBARBITAL 32.4 MG PO TABS: 64.8000 mg | ORAL_TABLET | Freq: Three times a day (TID) | ORAL | Status: DC

## 2022-01-04 MED ORDER — PHENOBARBITAL SODIUM 130 MG/ML IJ SOLN: 130.0000 mg | Freq: Three times a day (TID) | INTRAMUSCULAR | Status: DC

## 2022-01-04 MED ORDER — CHLORDIAZEPOXIDE HCL 5 MG PO CAPS: 25.0000 mg | ORAL_CAPSULE | Freq: Every day | ORAL | Status: DC

## 2022-01-04 MED ORDER — HYDROXYZINE HCL 25 MG PO TABS
25.0000 mg | ORAL_TABLET | Freq: Four times a day (QID) | ORAL | Status: AC | PRN
  Administered 2022-01-06: 25 mg via ORAL
  Filled 2022-01-04: qty 1

## 2022-01-04 MED ORDER — SODIUM CHLORIDE 0.9 % IV SOLN
260.0000 mg | Freq: Four times a day (QID) | INTRAVENOUS | Status: DC
  Administered 2022-01-04: 260 mg via INTRAVENOUS
  Filled 2022-01-04 (×2): qty 2

## 2022-01-04 MED ORDER — PHENOBARBITAL 32.4 MG PO TABS: 64.8000 mg | ORAL_TABLET | Freq: Every day | ORAL | Status: DC

## 2022-01-04 MED ORDER — HYDRALAZINE HCL 20 MG/ML IJ SOLN
10.0000 mg | INTRAMUSCULAR | Status: DC | PRN
  Administered 2022-01-04 – 2022-01-06 (×6): 10 mg via INTRAVENOUS
  Filled 2022-01-04 (×7): qty 1

## 2022-01-04 MED ORDER — SODIUM CHLORIDE 0.9 % IV SOLN
260.0000 mg | INTRAVENOUS | Status: AC
  Administered 2022-01-04: 260 mg via INTRAVENOUS
  Filled 2022-01-04: qty 2

## 2022-01-04 MED ORDER — CHLORDIAZEPOXIDE HCL 5 MG PO CAPS: 25.0000 mg | ORAL_CAPSULE | ORAL | Status: DC

## 2022-01-04 MED ORDER — ORAL CARE MOUTH RINSE
15.0000 mL | OROMUCOSAL | Status: DC
  Administered 2022-01-05 – 2022-01-10 (×18): 15 mL via OROMUCOSAL

## 2022-01-04 MED ORDER — LORAZEPAM 2 MG/ML IJ SOLN: 4.0000 mg | Freq: Once | INTRAMUSCULAR | Status: AC

## 2022-01-04 MED ORDER — DEXMEDETOMIDINE HCL IN NACL 400 MCG/100ML IV SOLN
0.4000 ug/kg/h | INTRAVENOUS | Status: DC
  Administered 2022-01-04: 0.4 ug/kg/h via INTRAVENOUS
  Administered 2022-01-04: 1.2 ug/kg/h via INTRAVENOUS
  Administered 2022-01-05: 0.9 ug/kg/h via INTRAVENOUS
  Filled 2022-01-04: qty 100
  Filled 2022-01-04: qty 200

## 2022-01-04 MED ORDER — LORAZEPAM 2 MG/ML IJ SOLN
INTRAMUSCULAR | Status: AC
  Administered 2022-01-04: 4 mg via INTRAVENOUS
  Filled 2022-01-04: qty 2

## 2022-01-04 MED ORDER — THIAMINE HCL 100 MG/ML IJ SOLN: 100.0000 mg | Freq: Once | INTRAMUSCULAR | Status: DC

## 2022-01-04 MED ORDER — ONDANSETRON 4 MG PO TBDP: 4.0000 mg | ORAL_TABLET | Freq: Four times a day (QID) | ORAL | Status: AC | PRN

## 2022-01-04 MED ORDER — ADULT MULTIVITAMIN W/MINERALS CH: 1.0000 | ORAL_TABLET | Freq: Every day | ORAL | Status: DC

## 2022-01-04 MED ORDER — SODIUM CHLORIDE 0.9 % IV SOLN: 260.0000 mg | Freq: Two times a day (BID) | INTRAVENOUS | Status: DC

## 2022-01-04 MED ORDER — CHLORDIAZEPOXIDE HCL 5 MG PO CAPS
25.0000 mg | ORAL_CAPSULE | Freq: Four times a day (QID) | ORAL | Status: DC
  Administered 2022-01-04 (×2): 25 mg via ORAL
  Filled 2022-01-04 (×2): qty 5

## 2022-01-04 MED ORDER — CHLORDIAZEPOXIDE HCL 25 MG PO CAPS: 25.0000 mg | ORAL_CAPSULE | Freq: Four times a day (QID) | ORAL | Status: DC | PRN

## 2022-01-04 MED ORDER — SODIUM CHLORIDE 0.9 % IV SOLN: 260.0000 mg | Freq: Once | INTRAVENOUS | Status: DC

## 2022-01-04 MED ORDER — DEXMEDETOMIDINE HCL IN NACL 400 MCG/100ML IV SOLN: 0.4000 ug/kg/h | INTRAVENOUS | Status: DC

## 2022-01-04 MED ORDER — LOSARTAN POTASSIUM 50 MG PO TABS
100.0000 mg | ORAL_TABLET | Freq: Every day | ORAL | Status: DC
  Administered 2022-01-04 – 2022-01-10 (×5): 100 mg via ORAL
  Filled 2022-01-04 (×6): qty 2

## 2022-01-04 MED ORDER — CHLORDIAZEPOXIDE HCL 5 MG PO CAPS: 25.0000 mg | ORAL_CAPSULE | Freq: Three times a day (TID) | ORAL | Status: DC

## 2022-01-04 MED ORDER — LOPERAMIDE HCL 2 MG PO CAPS: 2.0000 mg | ORAL_CAPSULE | ORAL | Status: AC | PRN

## 2022-01-04 NOTE — Progress Notes (Signed)
[place] Progress Note Patient Name: Andrew Haley DOB: 1965-12-04 MRN: [id number]   Date of Service  01/04/2022  HPI/Events of Note  Pt agitated, confused, trying to get up from bed.  Attempts to reorient him have been unsuccessful.  He has been given due ativan PRN and phenobarbital.   eICU Interventions  Will given one timde dose of ativan '4mg'$ , and restart precedex infusion.  Will monitor for development of hypotension, bradycardia.         [place] 01/04/2022, 7:34 PM

## 2022-01-04 NOTE — Progress Notes (Signed)
NAME:  Andrew Haley, MRN:  [id number], DOB:  30-Aug-1965, LOS: 2 ADMISSION DATE:  01/01/2022, CONSULTATION DATE:  01/03/22 REFERRING MD:  Andrew Haley, CHIEF COMPLAINT:  AMS   History of Present Illness:  Andrew Haley is a 56 y.o. M with PMH of obesity, ETOH and polysubstance abuse, HTN, prostate Ca and OSA who presented to [facility] [place].  EMS found patient tachycardic with decreased respirations, Narcan was administered which improved mental status marginally.  He has recently been prescribed oxycodone for L ankle chronic pain, but denied taking more than prescribed.  He also noted significant LLE edema and has recently been taking Lasix.  He seized in the ED and  was significantly hypercarbic in the ED and was intubated.  Work-up significant for WBC 17k, Creatinine 1.2, ETOH <10, CT chest without clear PE, patchy opacities bilaterally and 4.8cm ascending thoracic aortic aneurysm.  PCCM consult and transfer to [facility]  Pertinent  Medical History   has a past medical history of Arthritis, Class 1 obesity (95/62/1308), Complication of anesthesia, Essential hypertension (07/18/2020), Headache, Hypertension, Hypertriglyceridemia, Numbness of fingers of both hands, Prostate cancer ([place]) (2017), and Sleep apnea.   Significant Hospital Events: Including procedures, antibiotic start and stop dates in addition to other pertinent events   11/22 presented to [facility] ED, AMS, Sz, PNA intubated. Transfer to [facility] 11/23 through 11/24: Continued on LTM.  MRI and EEG negative for seizure.  UDS was positive for cocaine.  Neurology felt seizure was secondary to cocaine induced seizure and advised against anticonvulsant therapy 11/24 successfully extubated 11/25: Was hypertensive and agitated this morning.  Got some Ativan.  Starting Librium taper  Interim History / Subjective:   He awakens easily, his speech a little slurred.  Received Ativan earlier this morning  Objective    Blood pressure 125/71, pulse 79, temperature 99.5 F (37.5 C), resp. rate 14, height '6\' 2"'$  (1.88 m), weight 122.5 kg, SpO2 100 %.       Intake/Output Summary (Last 24 hours) at 01/03/2022 6578 Last data filed at 01/03/2022 0800 Gross per 24 hour  Intake 3289.34 ml  Output 1400 ml  Net 1889.34 ml   Filed Weights   01/01/22 2346 01/02/22 0600 01/03/22 0500  Weight: 116.5 kg 120 kg 122.5 kg   Examination: General 56 year old male patient resting in bed no acute distress HEENT normocephalic atraumatic mucous membranes clear and moist Pulmonary clear to auscultation Cardiac regular rate and rhythm Abdomen soft not tender Extremities warm dry dependent edema Neuro: Opens eyes to verbal request, oriented x 1-2 moves all extremities was agitated earlier  Resolved Hospital Problem list   Acute hypercarbic respiratory failure Sepsis which was present on admission  Assessment & Plan:   Acute Encephalopathy status post cocaine induced seizure History substance abuse, suspect element of withdrawal Plan No further illegal drugs, avoiding cocaine Add Librium taper No driving for 6 months Monitor for seizures Supportive care  Acute respiratory failure due to altered mental status CAP vs Aspiration Plan Day #4 antibiotics, preliminary sputum growing Staph aureus Narrow once sensitivities back, for now continuing vancomycin and Ancef  Lower extremity edema left greater than right No evidence of DVT Plan Supportive care  Thoracic aortic aneurysm Noted on CT chest during this admission.   Plan Will need outpatient follow-up  History HTN Plan Cozaar  Best Practice (right click and "Reselect all SmartList Selections" daily)   Diet/type: NPO DVT prophylaxis: prophylactic heparin  GI prophylaxis:  PPI Lines: Central line Foley:  Yes, and it is still needed Code Status:  full code Last date of multidisciplinary goals of care discussion [pending]  Critical care time:  NA    Can go to progressive Andrew Haley ACNP-BC [place] Pager # [phone] OR # [phone] if no answer

## 2022-01-05 ENCOUNTER — Inpatient Hospital Stay (HOSPITAL_COMMUNITY): Payer: Medicare HMO

## 2022-01-05 DIAGNOSIS — G40901 Epilepsy, unspecified, not intractable, with status epilepticus: Secondary | ICD-10-CM | POA: Diagnosis not present

## 2022-01-05 LAB — POCT I-STAT 7, (LYTES, BLD GAS, ICA,H+H)
Acid-Base Excess: 5 mmol/L — ABNORMAL HIGH (ref 0.0–2.0)
Acid-Base Excess: 4 mmol/L — ABNORMAL HIGH (ref 0.0–2.0)
Bicarbonate: 30.9 mmol/L — ABNORMAL HIGH (ref 20.0–28.0)
Bicarbonate: 27.7 mmol/L (ref 20.0–28.0)
Calcium, Ion: 1.22 mmol/L (ref 1.15–1.40)
Calcium, Ion: 1.19 mmol/L (ref 1.15–1.40)
HCT: 31 % — ABNORMAL LOW (ref 39.0–52.0)
HCT: 36 % — ABNORMAL LOW (ref 39.0–52.0)
Hemoglobin: 10.5 g/dL — ABNORMAL LOW (ref 13.0–17.0)
Hemoglobin: 12.2 g/dL — ABNORMAL LOW (ref 13.0–17.0)
O2 Saturation: 97 %
O2 Saturation: 96 %
Patient temperature: 98
Patient temperature: 99
Potassium: 3.4 mmol/L — ABNORMAL LOW (ref 3.5–5.1)
Potassium: 3.7 mmol/L (ref 3.5–5.1)
Sodium: 140 mmol/L (ref 135–145)
Sodium: 137 mmol/L (ref 135–145)
TCO2: 32 mmol/L (ref 22–32)
TCO2: 29 mmol/L (ref 22–32)
pCO2 arterial: 47.9 mmHg (ref 32–48)
pCO2 arterial: 38.1 mmHg (ref 32–48)
pH, Arterial: 7.415 (ref 7.35–7.45)
pH, Arterial: 7.471 — ABNORMAL HIGH (ref 7.35–7.45)
pO2, Arterial: 88 mmHg (ref 83–108)
pO2, Arterial: 77 mmHg — ABNORMAL LOW (ref 83–108)

## 2022-01-05 LAB — HEPATIC FUNCTION PANEL
ALT: 16 U/L (ref 0–44)
AST: 21 U/L (ref 15–41)
Albumin: 2.9 g/dL — ABNORMAL LOW (ref 3.5–5.0)
Alkaline Phosphatase: 49 U/L (ref 38–126)
Bilirubin, Direct: <0.1 mg/dL (ref 0.0–0.2)
Total Bilirubin: 0.6 mg/dL (ref 0.3–1.2)
Total Protein: 6.5 g/dL (ref 6.5–8.1)

## 2022-01-05 LAB — GLUCOSE, CAPILLARY
Glucose-Capillary: 101 mg/dL — ABNORMAL HIGH (ref 70–99)
Glucose-Capillary: 106 mg/dL — ABNORMAL HIGH (ref 70–99)
Glucose-Capillary: 107 mg/dL — ABNORMAL HIGH (ref 70–99)
Glucose-Capillary: 109 mg/dL — ABNORMAL HIGH (ref 70–99)
Glucose-Capillary: 127 mg/dL — ABNORMAL HIGH (ref 70–99)
Glucose-Capillary: 92 mg/dL (ref 70–99)

## 2022-01-05 LAB — BASIC METABOLIC PANEL
Anion gap: 11 (ref 5–15)
BUN: 5 mg/dL — ABNORMAL LOW (ref 6–20)
CO2: 26 mmol/L (ref 22–32)
Calcium: 9.1 mg/dL (ref 8.9–10.3)
Chloride: 104 mmol/L (ref 98–111)
Creatinine, Ser: 0.54 mg/dL — ABNORMAL LOW (ref 0.61–1.24)
GFR, Estimated: >60 mL/min (ref >60)
Glucose, Bld: 110 mg/dL — ABNORMAL HIGH (ref 70–99)
Potassium: 3.5 mmol/L (ref 3.5–5.1)
Sodium: 141 mmol/L (ref 135–145)

## 2022-01-05 LAB — CULTURE, RESPIRATORY W GRAM STAIN

## 2022-01-05 LAB — CBC
HCT: 35.2 % — ABNORMAL LOW (ref 39.0–52.0)
Hemoglobin: 11.7 g/dL — ABNORMAL LOW (ref 13.0–17.0)
MCH: 27.9 pg (ref 26.0–34.0)
MCHC: 33.2 g/dL (ref 30.0–36.0)
MCV: 83.8 fL (ref 80.0–100.0)
Platelets: 228 10*3/uL (ref 150–400)
RBC: 4.2 MIL/uL — ABNORMAL LOW (ref 4.22–5.81)
RDW: 12 % (ref 11.5–15.5)
WBC: 6.1 10*3/uL (ref 4.0–10.5)
nRBC: 0 % (ref 0.0–0.2)

## 2022-01-05 LAB — LACTIC ACID, PLASMA
Lactic Acid, Venous: 0.7 mmol/L (ref 0.5–1.9)
Lactic Acid, Venous: 0.6 mmol/L (ref 0.5–1.9)

## 2022-01-05 LAB — PHOSPHORUS: Phosphorus: 4.3 mg/dL (ref 2.5–4.6)

## 2022-01-05 LAB — MAGNESIUM: Magnesium: 1.7 mg/dL (ref 1.7–2.4)

## 2022-01-05 LAB — AMMONIA: Ammonia: 39 umol/L — ABNORMAL HIGH (ref 9–35)

## 2022-01-05 MED ORDER — LORAZEPAM 2 MG/ML IJ SOLN
1.0000 mg | Freq: Once | INTRAMUSCULAR | Status: AC
  Administered 2022-01-05: 1 mg via INTRAVENOUS
  Filled 2022-01-05: qty 1

## 2022-01-05 MED ORDER — DEXMEDETOMIDINE HCL IN NACL 400 MCG/100ML IV SOLN
INTRAVENOUS | Status: AC
  Administered 2022-01-05: 0.05 ug/kg/h via INTRAVENOUS
  Filled 2022-01-05: qty 100

## 2022-01-05 MED ORDER — LORAZEPAM 2 MG/ML IJ SOLN
1.0000 mg | INTRAMUSCULAR | Status: DC | PRN
  Administered 2022-01-05: 2 mg via INTRAVENOUS
  Administered 2022-01-05 – 2022-01-06 (×2): 1 mg via INTRAVENOUS
  Filled 2022-01-05 (×4): qty 1

## 2022-01-05 MED ORDER — CHLORDIAZEPOXIDE HCL 25 MG PO CAPS: 25.0000 mg | ORAL_CAPSULE | Freq: Every day | ORAL | Status: DC

## 2022-01-05 MED ORDER — MAGNESIUM SULFATE 4 GM/100ML IV SOLN
4.0000 g | Freq: Once | INTRAVENOUS | Status: AC
  Administered 2022-01-05: 4 g via INTRAVENOUS
  Filled 2022-01-05: qty 100

## 2022-01-05 MED ORDER — CHLORDIAZEPOXIDE HCL 25 MG PO CAPS
50.0000 mg | ORAL_CAPSULE | Freq: Once | ORAL | Status: AC
  Administered 2022-01-05: 50 mg via ORAL
  Filled 2022-01-05: qty 2

## 2022-01-05 MED ORDER — CHLORDIAZEPOXIDE HCL 25 MG PO CAPS
25.0000 mg | ORAL_CAPSULE | Freq: Three times a day (TID) | ORAL | Status: AC
  Administered 2022-01-07: 25 mg via ORAL
  Filled 2022-01-05: qty 1

## 2022-01-05 MED ORDER — POTASSIUM CHLORIDE CRYS ER 20 MEQ PO TBCR
40.0000 meq | EXTENDED_RELEASE_TABLET | Freq: Once | ORAL | Status: AC
  Administered 2022-01-05: 40 meq via ORAL
  Filled 2022-01-05: qty 2

## 2022-01-05 MED ORDER — CHLORDIAZEPOXIDE HCL 25 MG PO CAPS
25.0000 mg | ORAL_CAPSULE | ORAL | Status: AC
  Administered 2022-01-07 – 2022-01-08 (×2): 25 mg via ORAL
  Filled 2022-01-05 (×2): qty 1

## 2022-01-05 MED ORDER — PHENOBARBITAL 32.4 MG PO TABS: 32.4000 mg | ORAL_TABLET | Freq: Every day | ORAL | Status: DC

## 2022-01-05 MED ORDER — DEXMEDETOMIDINE HCL IN NACL 400 MCG/100ML IV SOLN
0.0500 ug/kg/h | INTRAVENOUS | Status: DC
  Administered 2022-01-06: 0.8 ug/kg/h via INTRAVENOUS
  Administered 2022-01-06: 0.4 ug/kg/h via INTRAVENOUS
  Administered 2022-01-06: 0.6 ug/kg/h via INTRAVENOUS
  Administered 2022-01-06: 0.8 ug/kg/h via INTRAVENOUS
  Filled 2022-01-05 (×4): qty 100

## 2022-01-05 MED ORDER — PHENOBARBITAL 32.4 MG PO TABS: 64.8000 mg | ORAL_TABLET | Freq: Two times a day (BID) | ORAL | Status: DC

## 2022-01-05 MED ORDER — PHENOBARBITAL SODIUM 130 MG/ML IJ SOLN
130.0000 mg | Freq: Three times a day (TID) | INTRAMUSCULAR | Status: AC
  Administered 2022-01-05 – 2022-01-06 (×3): 130 mg via INTRAVENOUS
  Filled 2022-01-05 (×3): qty 1

## 2022-01-05 MED ORDER — POTASSIUM CHLORIDE 10 MEQ/100ML IV SOLN
10.0000 meq | INTRAVENOUS | Status: AC
  Administered 2022-01-05 (×3): 10 meq via INTRAVENOUS
  Filled 2022-01-05 (×3): qty 100

## 2022-01-05 MED ORDER — DOCUSATE SODIUM 50 MG/5ML PO LIQD
100.0000 mg | Freq: Two times a day (BID) | ORAL | Status: DC
  Administered 2022-01-05: 100 mg
  Filled 2022-01-05 (×2): qty 10

## 2022-01-05 MED ORDER — SODIUM CHLORIDE 0.9 % IV SOLN
260.0000 mg | Freq: Once | INTRAVENOUS | Status: AC
  Administered 2022-01-05: 260 mg via INTRAVENOUS
  Filled 2022-01-05: qty 2

## 2022-01-05 MED ORDER — CHLORDIAZEPOXIDE HCL 25 MG PO CAPS
25.0000 mg | ORAL_CAPSULE | Freq: Four times a day (QID) | ORAL | Status: AC
  Administered 2022-01-05 (×2): 25 mg via ORAL
  Filled 2022-01-05 (×3): qty 1

## 2022-01-05 MED ORDER — PHENOBARBITAL SODIUM 65 MG/ML IJ SOLN
65.0000 mg | Freq: Three times a day (TID) | INTRAMUSCULAR | Status: AC
  Administered 2022-01-06 – 2022-01-07 (×3): 65 mg via INTRAVENOUS
  Filled 2022-01-05 (×3): qty 1

## 2022-01-05 NOTE — Progress Notes (Addendum)
[facility] Progress Note Patient Name: ALFORD GAMERO DOB: August 07, 1965 MRN: [id number]   Date of Service  01/05/2022  HPI/Events of Note  RN notified us that patient has had tachypnea. Not new. Seen on camera. Has intermittent agitation, wakes up, gets agitated, then falls back to sleep. On room air. O2 sat 97. RR charted as 40 but does slow down as well. Has had issues with titrating precedex and went unresponsive when dose was over 0.4. Currently at 0.3 mic. On Phenobarb, Has not needed IV ativan on this shift. Is clearly not able to swallow so oral medications have not been given. Remains delirious.   eICU Interventions  NPO order placed ABG now Will need intubation if condition declines D/w RN .      Intervention Category Major Interventions: Delirium, psychosis, severe agitation - evaluation and management  Elvie Maines G Jamyrah Saur 01/05/2022, 10:08 PM  Addendum at 11:40 pm. Reviewed abg and seen on camera . Continue as planned close monitoring   Addendum at 6 am: hypertensive despite 5 am dose of hydralazine. Repeat another dose

## 2022-01-05 NOTE — Progress Notes (Signed)
NPSalvadore Haley) notified of pt change in state, AMS/behavior ( spitting at RNs and trying to get out of resistants w/ multiple attempts of reorientation)  and that new orders were required in order to keep pt/RN safe. Despite reaching out multiple times to NP no new orders were given and pt's state continue to worsen(trying to get out of bed and using verbal threats at this RN . Pt safety continued to become a high concern CN involved for NP to come to bedside.

## 2022-01-05 NOTE — Progress Notes (Signed)
Patient is staying in ICU service. Plz call back admissions when ready to pick up . [facility] will not follow

## 2022-01-05 NOTE — Progress Notes (Signed)
NAME:  Andrew Haley, MRN:  [id number], DOB:  Dec 12, 1965, LOS: 4 ADMISSION DATE:  01/01/2022, CONSULTATION DATE:  01/05/22 REFERRING MD:  Sabra Heck, CHIEF COMPLAINT:  AMS   History of Present Illness:  Andrew Haley is a 56 y.o. M with PMH of obesity, ETOH and polysubstance abuse, HTN, prostate Ca and OSA who presented to [facility] [place].  EMS found patient tachycardic with decreased respirations, Narcan was administered which improved mental status marginally.  He has recently been prescribed oxycodone for L ankle chronic pain, but denied taking more than prescribed.  He also noted significant LLE edema and has recently been taking Lasix.  He seized in the ED and  was significantly hypercarbic in the ED and was intubated.  Work-up significant for WBC 17k, Creatinine 1.2, ETOH <10, CT chest without clear PE, patchy opacities bilaterally and 4.8cm ascending thoracic aortic aneurysm.  PCCM consult and transfer to [facility]  Pertinent  Medical History   has a past medical history of Arthritis, Class 1 obesity (01/74/9449), Complication of anesthesia, Essential hypertension (07/18/2020), Headache, Hypertension, Hypertriglyceridemia, Numbness of fingers of both hands, Prostate cancer ([place]) (2017), and Sleep apnea.   Significant Hospital Events: Including procedures, antibiotic start and stop dates in addition to other pertinent events   11/22 presented to [facility] ED, AMS, Sz, PNA intubated. Transfer to [facility] 11/23 through 11/24: Continued on LTM.  MRI and EEG negative for seizure.  UDS was positive for cocaine.  Neurology felt seizure was secondary to cocaine induced seizure and advised against anticonvulsant therapy 11/24 successfully extubated 11/25: Was hypertensive and agitated this morning.  Got some Ativan.  Starting Librium & Phenobarb taper. Was briefly on precedex but this was stopped.   Interim History / Subjective:  Abg reviewed Agitated initially, now  over sedated once again.  Precedex turned off   Objective   Blood pressure 138/79, pulse 66, temperature 98 F (36.7 C), temperature source Axillary, resp. rate (Abnormal) 28, height '6\' 2"'$  (1.88 m), weight 115.1 kg, SpO2 96 %.       Intake/Output Summary (Last 24 hours) at 01/05/2022 6759 Last data filed at 01/05/2022 0700 Gross per 24 hour  Intake 1072.34 ml  Output 4250 ml  Net -3177.66 ml   Filed Weights   01/03/22 0500 01/04/22 0500 01/05/22 0453  Weight: 122.5 kg 119.8 kg 115.1 kg   Examination: General restless 56 year old male patient was initially sedated, Precedex off, now a bit restless HEENT normocephalic atraumatic no jugular venous distention Pulmonary: Clear to auscultation slightly tachypneic Cardiac: Regular rate and rhythm Abdomen: Soft nontender Extremities: Warm dry Neuro: Awake, confused, restless and impulsive no focal motor deficits appreciated  Resolved Hospital Problem list   Acute hypercarbic respiratory failure Sepsis which was present on admission  Assessment & Plan:   Acute Encephalopathy status post cocaine induced seizure History substance abuse, suspect element of withdrawal (which is now the driving factor) Plan Alcohol and drug cessation  phenobarb taper  No driving for 6 mo Thiamine and folate  Repeat ammonia in the morning.  I am going to hold off from lactulose given his agitation at this point, I think that will only make things worse if we add something that we will make him need to go to the bathroom frequently  Acute respiratory failure due to altered mental status CAP vs Aspiration-->abundant  staph Plan Day 5 abx currently awaiting susceptibilities currently on ancef  Fluid and electrolyte balance: Hypokalemia Plan  Replace and recheck  Lower extremity edema left greater than right No evidence of DVT Plan Supportive care   Thoracic aortic aneurysm Noted on CT chest during this admission.   Plan Needs op  f/u  History HTN Plan cozaar  Best Practice (right click and "Reselect all SmartList Selections" daily)   Diet/type: NPO DVT prophylaxis: prophylactic heparin  GI prophylaxis: PPI Lines: Central line Foley:  Yes, and it is still needed Code Status:  full code Last date of multidisciplinary goals of care discussion [pending]  Critical care time: 32 minutes   Erick Colace ACNP-BC [place] Pager # [phone] OR # [phone] if no answer

## 2022-01-05 NOTE — Progress Notes (Addendum)
Very difficult to control DTs Have loaded him w/ fairly high dose Phenobarb, have also started Librium. He is spitting at staff, combative and restless. Paradoxically the two times we have had him on Precedex he's been almost unresponsive even at what seems like typically low dosing.  Plan Will start low dose precedex w/ ceiling at 0.5 (instead of '1mg'$ ) Cont librium and phenobarb w/ PRN ativan (I am hoping to get a little overlap) Getting CXR given increased labored breathing (although I think this is more just his delirium he certainly could have aspirated)   Erick Colace ACNP-BC [place] Pager # [phone] OR # [phone] if no answer

## 2022-01-05 NOTE — Progress Notes (Signed)
Still very agitated. Currently in 4 pt restraints.  Plan Inc ceiling on precedex to 0.4  Erick Colace ACNP-BC [facility] Pager # [phone] OR # [phone] if no answer

## 2022-01-05 NOTE — Progress Notes (Signed)
[place] Progress Note Patient Name: Andrew Haley DOB: July 13, 1965 MRN: [id number]   Date of Service  01/05/2022  HPI/Events of Note  Pt now reported to be lethargic, difficult to arouse. Described to have intermittent twitching of lower extremities as well. Precedex has been turned off and ativan was given.  Pt remains lethargic on camera evaluation, still able to protect his airway.   eICU Interventions  AM labs just drawn. Will add on LFTs,  Will also check lactic acid, ABG, CXR. Will check head CT.         Sui Kasparek M DELA CRUZ 01/05/2022, 5:13 AM

## 2022-01-06 ENCOUNTER — Inpatient Hospital Stay (HOSPITAL_COMMUNITY): Payer: Medicare HMO

## 2022-01-06 DIAGNOSIS — G40901 Epilepsy, unspecified, not intractable, with status epilepticus: Secondary | ICD-10-CM | POA: Diagnosis not present

## 2022-01-06 LAB — CBC
HCT: 37.6 % — ABNORMAL LOW (ref 39.0–52.0)
Hemoglobin: 12.3 g/dL — ABNORMAL LOW (ref 13.0–17.0)
MCH: 27.1 pg (ref 26.0–34.0)
MCHC: 32.7 g/dL (ref 30.0–36.0)
MCV: 82.8 fL (ref 80.0–100.0)
Platelets: 288 10*3/uL (ref 150–400)
RBC: 4.54 MIL/uL (ref 4.22–5.81)
RDW: 12 % (ref 11.5–15.5)
WBC: 8.3 10*3/uL (ref 4.0–10.5)
nRBC: 0 % (ref 0.0–0.2)

## 2022-01-06 LAB — CULTURE, BLOOD (ROUTINE X 2)
Culture: NO GROWTH
Culture: NO GROWTH
Special Requests: ADEQUATE

## 2022-01-06 LAB — GLUCOSE, CAPILLARY
Glucose-Capillary: 110 mg/dL — ABNORMAL HIGH (ref 70–99)
Glucose-Capillary: 112 mg/dL — ABNORMAL HIGH (ref 70–99)
Glucose-Capillary: 115 mg/dL — ABNORMAL HIGH (ref 70–99)
Glucose-Capillary: 124 mg/dL — ABNORMAL HIGH (ref 70–99)
Glucose-Capillary: 127 mg/dL — ABNORMAL HIGH (ref 70–99)
Glucose-Capillary: 95 mg/dL (ref 70–99)
Glucose-Capillary: 97 mg/dL (ref 70–99)

## 2022-01-06 LAB — AMMONIA: Ammonia: 27 umol/L (ref 9–35)

## 2022-01-06 LAB — HEPATIC FUNCTION PANEL
ALT: 18 U/L (ref 0–44)
AST: 21 U/L (ref 15–41)
Albumin: 3.2 g/dL — ABNORMAL LOW (ref 3.5–5.0)
Alkaline Phosphatase: 51 U/L (ref 38–126)
Bilirubin, Direct: 0.1 mg/dL (ref 0.0–0.2)
Indirect Bilirubin: 0.6 mg/dL (ref 0.3–0.9)
Total Bilirubin: 0.7 mg/dL (ref 0.3–1.2)
Total Protein: 6.7 g/dL (ref 6.5–8.1)

## 2022-01-06 LAB — BASIC METABOLIC PANEL
Anion gap: 15 (ref 5–15)
BUN: 6 mg/dL (ref 6–20)
CO2: 23 mmol/L (ref 22–32)
Calcium: 8.9 mg/dL (ref 8.9–10.3)
Chloride: 98 mmol/L (ref 98–111)
Creatinine, Ser: 0.62 mg/dL (ref 0.61–1.24)
GFR, Estimated: >60 mL/min (ref >60)
Glucose, Bld: 106 mg/dL — ABNORMAL HIGH (ref 70–99)
Potassium: 3.4 mmol/L — ABNORMAL LOW (ref 3.5–5.1)
Sodium: 136 mmol/L (ref 135–145)

## 2022-01-06 LAB — MAGNESIUM: Magnesium: 1.9 mg/dL (ref 1.7–2.4)

## 2022-01-06 LAB — PHOSPHORUS: Phosphorus: 3.5 mg/dL (ref 2.5–4.6)

## 2022-01-06 MED ORDER — DEXMEDETOMIDINE HCL IN NACL 400 MCG/100ML IV SOLN
0.4000 ug/kg/h | INTRAVENOUS | Status: DC
  Administered 2022-01-06: 0.8 ug/kg/h via INTRAVENOUS
  Administered 2022-01-07: 0.9 ug/kg/h via INTRAVENOUS
  Administered 2022-01-07: 0.5 ug/kg/h via INTRAVENOUS
  Administered 2022-01-07 (×2): 0.9 ug/kg/h via INTRAVENOUS
  Administered 2022-01-08: 0.7 ug/kg/h via INTRAVENOUS
  Administered 2022-01-08: 0.6 ug/kg/h via INTRAVENOUS
  Filled 2022-01-06 (×7): qty 100

## 2022-01-06 MED ORDER — LORAZEPAM 2 MG/ML IJ SOLN
2.0000 mg | INTRAMUSCULAR | Status: DC | PRN
  Administered 2022-01-06 (×2): 4 mg via INTRAVENOUS
  Administered 2022-01-07 – 2022-01-08 (×2): 2 mg via INTRAVENOUS
  Filled 2022-01-06: qty 1
  Filled 2022-01-06 (×2): qty 2
  Filled 2022-01-06: qty 1

## 2022-01-06 MED ORDER — POTASSIUM CHLORIDE 10 MEQ/100ML IV SOLN
10.0000 meq | INTRAVENOUS | Status: AC
  Administered 2022-01-06 (×6): 10 meq via INTRAVENOUS
  Filled 2022-01-06 (×2): qty 100

## 2022-01-06 MED ORDER — OXYCODONE HCL 5 MG PO TABS
5.0000 mg | ORAL_TABLET | Freq: Four times a day (QID) | ORAL | Status: DC | PRN
  Administered 2022-01-07 – 2022-01-10 (×10): 5 mg via ORAL
  Filled 2022-01-06 (×10): qty 1

## 2022-01-06 MED ORDER — SENNOSIDES-DOCUSATE SODIUM 8.6-50 MG PO TABS
2.0000 | ORAL_TABLET | Freq: Two times a day (BID) | ORAL | Status: DC
  Administered 2022-01-07 – 2022-01-10 (×6): 2 via ORAL
  Filled 2022-01-06 (×6): qty 2

## 2022-01-06 MED ORDER — HYDRALAZINE HCL 20 MG/ML IJ SOLN
10.0000 mg | Freq: Once | INTRAMUSCULAR | Status: AC
  Administered 2022-01-06: 10 mg via INTRAVENOUS
  Filled 2022-01-06: qty 1

## 2022-01-06 MED ORDER — FENTANYL CITRATE PF 50 MCG/ML IJ SOSY
25.0000 ug | PREFILLED_SYRINGE | INTRAMUSCULAR | Status: DC | PRN
  Administered 2022-01-06 – 2022-01-07 (×5): 25 ug via INTRAVENOUS
  Filled 2022-01-06 (×5): qty 1

## 2022-01-06 NOTE — Progress Notes (Addendum)
Pt verbally and physically abusive to this RN and staff. Pt continuously calling this RN the N-word, NT B-word, and threatening to kill staff stating, "You should be afraid of me because I'm going to kill you." This RN tried to trial pt out of restraints by undoing BiLat LE restraints, but pt is kicking at staff. Restraints re-applied. Pt with increased agitation.

## 2022-01-06 NOTE — Progress Notes (Signed)
Pt phone at bedside.

## 2022-01-06 NOTE — Progress Notes (Signed)
NAME:  Andrew Haley, MRN:  [id number], DOB:  07-May-1965, LOS: 5 ADMISSION DATE:  01/01/2022, CONSULTATION DATE:  01/06/22 REFERRING MD:  Sabra Heck, CHIEF COMPLAINT:  AMS   History of Present Illness:  Andrew Haley is a 56 y.o. M with PMH of obesity, ETOH and polysubstance abuse, HTN, prostate Ca and OSA who presented to [facility] [place].  EMS found patient tachycardic with decreased respirations, Narcan was administered which improved mental status marginally.  He has recently been prescribed oxycodone for L ankle chronic pain, but denied taking more than prescribed.  He also noted significant LLE edema and has recently been taking Lasix.  He seized in the ED and  was significantly hypercarbic in the ED and was intubated.  Work-up significant for WBC 17k, Creatinine 1.2, ETOH <10, CT chest without clear PE, patchy opacities bilaterally and 4.8cm ascending thoracic aortic aneurysm.  PCCM consult and transfer to [facility]  Pertinent  Medical History   has a past medical history of Arthritis, Class 1 obesity (76/73/4193), Complication of anesthesia, Essential hypertension (07/18/2020), Headache, Hypertension, Hypertriglyceridemia, Numbness of fingers of both hands, Prostate cancer ([place]) (2017), and Sleep apnea.   Significant Hospital Events: Including procedures, antibiotic start and stop dates in addition to other pertinent events   11/22 presented to [facility] ED, AMS, Sz, PNA intubated. Transfer to [facility] 11/23 through 11/24: Continued on LTM.  MRI and EEG negative for seizure.  UDS was positive for cocaine.  Neurology felt seizure was secondary to cocaine induced seizure and advised against anticonvulsant therapy 11/24 successfully extubated 11/25: Was hypertensive and agitated this morning.  Got some Ativan.  Starting Librium & Phenobarb taper. Was briefly on precedex but this was stopped.  11/27 still agitated, unresponsive to voice, moves spontaneously    Interim History / Subjective:   On precedex, agitated, in 4 point restraints   Objective   Blood pressure (!) 167/98, pulse (!) 101, temperature 98.4 F (36.9 C), temperature source Axillary, resp. rate (!) 30, height '6\' 2"'$  (1.88 m), weight 115.1 kg, SpO2 95 %.       Intake/Output Summary (Last 24 hours) at 01/06/2022 7902 Last data filed at 01/06/2022 0700 Gross per 24 hour  Intake 925.45 ml  Output 4500 ml  Net -3574.55 ml   Filed Weights   01/03/22 0500 01/04/22 0500 01/05/22 0453  Weight: 122.5 kg 119.8 kg 115.1 kg   Examination: General middle-aged gentleman agitated 4-point restraints HEENT NCAT Pulmonary: Diminished breath sounds bilaterally Cardiac: Tachycardic, regular Abdomen: Soft nontender mildly distended Extremities: No obvious rash Neuro: Opens eyes to voice, confused moves all 4 extremities spontaneously  Resolved Hospital Problem list   Acute hypercarbic respiratory failure Sepsis which was present on admission   Assessment & Plan:   Acute Encephalopathy status post cocaine induced seizure History substance abuse, suspect element of withdrawal (which is now the driving factor) Plan: Continue Precedex Continue Librium taper Continue phenobarbital taper Continue thiamine folate Increased his as needed Ativan dosing Goal RASS -1  Acute respiratory failure due to altered mental status CAP vs Aspiration-->abundant  staph Plan Complete 5 days of antibiotics De-escalated to Ancef  Fluid and electrolyte balance: Hypokalemia Plan Additional replacement of potassium given IV today.  Lower extremity edema left greater than right No evidence of DVT Plan Supportive care  Thoracic aortic aneurysm Noted on CT chest during this admission.   Plan Outpatient follow-up  History HTN Plan Holding home BP meds  Best  Practice (right click and "Reselect all SmartList Selections" daily)   Diet/type: NPO DVT prophylaxis: prophylactic heparin  GI  prophylaxis: PPI Lines: Central line Foley:  Yes, and it is still needed Code Status:  full code Last date of multidisciplinary goals of care discussion [we will call and update family]   This patient is critically ill with multiple organ system failure; which, requires frequent high complexity decision making, assessment, support, evaluation, and titration of therapies. This was completed through the application of advanced monitoring technologies and extensive interpretation of multiple databases. During this encounter critical care time was devoted to patient care services described in this note for 32 minutes.  Garner Nash, DO [facility] 01/06/2022 8:28 AM

## 2022-01-07 ENCOUNTER — Inpatient Hospital Stay (HOSPITAL_COMMUNITY): Payer: Medicare HMO

## 2022-01-07 DIAGNOSIS — F10931 Alcohol use, unspecified with withdrawal delirium: Secondary | ICD-10-CM | POA: Diagnosis not present

## 2022-01-07 LAB — GLUCOSE, CAPILLARY
Glucose-Capillary: 95 mg/dL (ref 70–99)
Glucose-Capillary: 115 mg/dL — ABNORMAL HIGH (ref 70–99)
Glucose-Capillary: 108 mg/dL — ABNORMAL HIGH (ref 70–99)
Glucose-Capillary: 101 mg/dL — ABNORMAL HIGH (ref 70–99)
Glucose-Capillary: 95 mg/dL (ref 70–99)
Glucose-Capillary: 87 mg/dL (ref 70–99)

## 2022-01-07 LAB — CBC
HCT: 41.3 % (ref 39.0–52.0)
Hemoglobin: 13.4 g/dL (ref 13.0–17.0)
MCH: 27 pg (ref 26.0–34.0)
MCHC: 32.4 g/dL (ref 30.0–36.0)
MCV: 83.1 fL (ref 80.0–100.0)
Platelets: 333 10*3/uL (ref 150–400)
RBC: 4.97 MIL/uL (ref 4.22–5.81)
RDW: 12.3 % (ref 11.5–15.5)
WBC: 9.6 10*3/uL (ref 4.0–10.5)
nRBC: 0 % (ref 0.0–0.2)

## 2022-01-07 MED ORDER — ENOXAPARIN SODIUM 40 MG/0.4ML IJ SOSY
40.0000 mg | PREFILLED_SYRINGE | Freq: Every day | INTRAMUSCULAR | Status: DC
  Administered 2022-01-07 – 2022-01-10 (×4): 40 mg via SUBCUTANEOUS
  Filled 2022-01-07 (×4): qty 0.4

## 2022-01-07 MED ORDER — PHENOBARBITAL SODIUM 65 MG/ML IJ SOLN
65.0000 mg | Freq: Two times a day (BID) | INTRAMUSCULAR | Status: AC
  Administered 2022-01-07 – 2022-01-08 (×2): 65 mg via INTRAVENOUS
  Filled 2022-01-07 (×2): qty 1

## 2022-01-07 MED ORDER — PHENOBARBITAL SODIUM 65 MG/ML IJ SOLN: 32.5000 mg | Freq: Every day | INTRAMUSCULAR | Status: DC

## 2022-01-07 NOTE — Evaluation (Signed)
Clinical/Bedside Swallow Evaluation Patient Details  Name: Andrew Haley MRN: [id number] Date of Birth: 1965-05-16  Today's Date: 01/07/2022 Time: SLP Start Time (ACUTE ONLY): 1420 SLP Stop Time (ACUTE ONLY): 1501 SLP Time Calculation (min) (ACUTE ONLY): 41 min  Past Medical History:  Past Medical History:  Diagnosis Date   Arthritis    Class 1 obesity 85/27/7824   Complication of anesthesia    slow to wake up   Essential hypertension 07/18/2020   Headache    Hypertension    Hypertriglyceridemia    Numbness of fingers of both hands    Prostate cancer ([place]) 2017   Sleep apnea    CPAP   Past Surgical History:  Past Surgical History:  Procedure Laterality Date   ANKLE SURGERY Left    COLONOSCOPY WITH PROPOFOL N/A 09/28/2017   Procedure: COLONOSCOPY WITH PROPOFOL;  Surgeon: Daneil Dolin, MD;  Location: [facility];  Service: Endoscopy;  Laterality: N/A;  12:00pm   HAND SURGERY Right 2003   cysts    JOINT REPLACEMENT     LESION REMOVAL Left 09/23/2013   Procedure: MINOR EXCISION 3 CM SKIN LESION OF LEFT THIGH ;  Surgeon: Jamesetta So, MD;  Location: [facility] ORS;  Service: General;  Laterality: Left;   LYMPHADENECTOMY Bilateral 11/29/2015   Procedure: LYMPHADENECTOMY;  Surgeon: Raynelle Bring, MD;  Location: [facility] ORS;  Service: Urology;  Laterality: Bilateral;   ROBOT ASSISTED LAPAROSCOPIC RADICAL PROSTATECTOMY N/A 11/29/2015   Procedure: XI ROBOTIC ASSISTED LAPAROSCOPIC RADICAL PROSTATECTOMY LEVEL 3;  Surgeon: Raynelle Bring, MD;  Location: [facility] ORS;  Service: Urology;  Laterality: N/A;   TOTAL KNEE ARTHROPLASTY Left 02/22/2014   Procedure: LEFT TOTAL KNEE ARTHROPLASTY;  Surgeon: Tobi Bastos, MD;  Location: [facility];  Service: Orthopedics;  Laterality: Left;   TOTAL KNEE ARTHROPLASTY Right 05/23/2014   Procedure: RIGHT TOTAL KNEE ARTHROPLASTY;  Surgeon: Latanya Maudlin, MD;  Location: [facility];  Service: Orthopedics;  Laterality: Right;   TOTAL KNEE REVISION Right 03/04/2021    Procedure: TOTAL KNEE REVISION;  Surgeon: Paralee Cancel, MD;  Location: [facility];  Service: Orthopedics;  Laterality: Right;   HPI:  Pt is a 56 yo M presenting 11/22 to [facility] [place]. ETT 11/22-11/24. Pt with encephalopathy s/p cocaine induced seizure, suspected element of withdrawal, acute respiratory failure due to AMS, and CAP vs aspiration PNA. MRI Brain negative. PMH includes: obesity, ETOH and polysubstance abuse, HTN, prostate CA, OSA    Assessment / Plan / Recommendation  Clinical Impression  Pt presents with signs of an acute, post-extubation dysphagia as well as AMS. He is highly distractible and labile. His voice is hoarse, and he has to work to clear a lot of secretions prior to PO trials. Pt subjectively says he can't swallow and that it is hard to do so. He does swallow when given POs, but there is occasional coughing with thin liquids and he cannot drink three ounces consecutively without having a stronger coughing episode. Given the above including his secretion management, would give him a little more time before starting PO diet. For today, would allow him small amounts of ice chips (with RN only due to concern for impulsivity), thorough oral care, and meds crushed in puree as necessary. SLP will f/u for potential to advance to a PO diet versus need for instrumental testing. SLP Visit Diagnosis: Dysphagia, unspecified (R13.10)    Aspiration Risk  Moderate aspiration risk    Diet Recommendation NPO  except meds;Ice chips PRN after oral care   Medication Administration: Crushed with puree Supervision: Full supervision/cueing for compensatory strategies    Other  Recommendations Oral Care Recommendations: Oral care QID Other Recommendations: Have oral suction available    Recommendations for follow up therapy are one component of a multi-disciplinary discharge planning process, led by the attending physician.  Recommendations may  be updated based on patient status, additional functional criteria and insurance authorization.  Follow up Recommendations  (tba)      Assistance Recommended at Discharge    Functional Status Assessment Patient has had a recent decline in their functional status and demonstrates the ability to make significant improvements in function in a reasonable and predictable amount of time.  Frequency and Duration min 2x/week  2 weeks       Prognosis Prognosis for Safe Diet Advancement: Good Barriers to Reach Goals: Cognitive deficits      Swallow Study   General HPI: Pt is a 56 yo M presenting 11/22 to [facility] [place]. ETT 11/22-11/24. Pt with encephalopathy s/p cocaine induced seizure, suspected element of withdrawal, acute respiratory failure due to AMS, and CAP vs aspiration PNA. MRI Brain negative. PMH includes: obesity, ETOH and polysubstance abuse, HTN, prostate CA, OSA Type of Study: Bedside Swallow Evaluation Previous Swallow Assessment: none in chart Diet Prior to this Study: NPO Temperature Spikes Noted: No Respiratory Status: Room air History of Recent Intubation: Yes Length of Intubations (days): 2 days Date extubated: 01/03/22 Behavior/Cognition: Alert;Cooperative;Distractible;Requires cueing Oral Cavity Assessment: Excessive secretions Oral Care Completed by SLP: Yes Oral Cavity - Dentition: Poor condition;Missing dentition;Dentures, not available;Other (Comment) (pt said his partial dentures (top and bottom) were lost at [facility]) Vision: Functional for self-feeding Self-Feeding Abilities: Needs assist Patient Positioning: Upright in bed Baseline Vocal Quality: Hoarse Volitional Cough: Strong Volitional Swallow: Able to elicit (says it's really hard and hurts to do it)    Oral/Motor/Sensory Function Overall Oral Motor/Sensory Function: Within functional limits   Ice Chips Ice chips: Within functional limits Presentation:  Spoon   Thin Liquid Thin Liquid: Impaired Presentation: Cup;Self Fed;Spoon;Straw Pharyngeal  Phase Impairments: Cough - Immediate;Cough - Delayed    Nectar Thick Nectar Thick Liquid: Not tested   Honey Thick Honey Thick Liquid: Not tested   Puree Puree: Within functional limits Presentation: Spoon   Solid     Solid: Not tested      Osie Bond., M.A. [place] Office [phone]  Secure chat preferred  01/07/2022,3:16 PM

## 2022-01-07 NOTE — Progress Notes (Signed)
NAME:  Andrew Haley, MRN:  [id number], DOB:  [birth year], LOS: 6 ADMISSION DATE:  01/01/2022, CONSULTATION DATE:  01/07/22 REFERRING MD:  Sabra Heck, CHIEF COMPLAINT:  AMS   History of Present Illness:  Andrew Haley is a 56 y.o. M with PMH of obesity, ETOH and polysubstance abuse, HTN, prostate Ca and OSA who presented [facility] [place].  EMS found patient tachycardic with decreased respirations, Narcan was administered which improved mental status marginally.  He has recently been prescribed oxycodone for L ankle chronic pain, but denied taking more than prescribed.  He also noted significant LLE edema and has recently been taking Lasix.  He seized in the ED and  was significantly hypercarbic in the ED and was intubated.  Work-up significant for WBC 17k, Creatinine 1.2, ETOH <10, CT chest without clear PE, patchy opacities bilaterally and 4.8cm ascending thoracic aortic aneurysm.  PCCM consult and transfer to [facility]  Pertinent  Medical History   has a past medical history of Arthritis, Class 1 obesity (00/86/7619), Complication of anesthesia, Essential hypertension (07/18/2020), Headache, Hypertension, Hypertriglyceridemia, Numbness of fingers of both hands, Prostate cancer ([place]) (2017), and Sleep apnea.   Significant Hospital Events: Including procedures, antibiotic start and stop dates in addition to other pertinent events   11/22 presented to [facility] ED, AMS, Sz, PNA intubated. Transfer to [facility] 11/23 through 11/24: Continued on LTM.  MRI and EEG negative for seizure.  UDS was positive for cocaine.  Neurology felt seizure was secondary to cocaine induced seizure and advised against anticonvulsant therapy 11/24 successfully extubated 11/25: Was hypertensive and agitated this morning.  Got some Ativan.  Starting Librium & Phenobarb taper. Was briefly on precedex but this was stopped.  11/27 still agitated, unresponsive to voice, moves spontaneously    Interim History / Subjective:  No acute change overnight. Comfortable on precedex. Orientation waxes and wanes. Drowsy but mostly oriented this morning. Asking appropriate questions. Wants his girlfriend called.   Objective   Blood pressure 125/82, pulse 72, temperature (!) 97.4 F (36.3 C), temperature source Axillary, resp. rate (!) 43, height '6\' 2"'$  (1.88 m), weight 115.1 kg, SpO2 92 %.       Intake/Output Summary (Last 24 hours) at 01/07/2022 0837 Last data filed at 01/07/2022 0700 Gross per 24 hour  Intake 1539.89 ml  Output 2800 ml  Net -1260.11 ml   Filed Weights   01/03/22 0500 01/04/22 0500 01/05/22 0453  Weight: 122.5 kg 119.8 kg 115.1 kg   Examination: General wdwn male, NAD in bed  HEENT NCAT Pulmonary: resps even, non labored, mild tachypnea, diminished  Cardiac: Tachycardic, regular Abdomen: Soft nontender mildly distended Extremities: No obvious rash Neuro: drowsy on precedex drip, slow to respond but oriented to self and place  Resolved Hospital Problem list   Acute hypercarbic respiratory failure Sepsis which was present on admission   Assessment & Plan:   Acute Encephalopathy status post cocaine induced seizure History substance abuse, suspect element of withdrawal (which is now the driving factor) Plan: Continue Precedex and wean as able  Continue Librium taper Continue phenobarbital taper Continue thiamine, folate PRN ativan  Goal RASS -1 Swallow eval when mental status allows   Acute respiratory failure due to altered mental status CAP vs Aspiration-->abundant  staph Plan Complete 5 days of antibiotics De-escalated to Ancef - stop date in place  Monitor respiratory status closely  Mobilize as able once agitation improved  F/u CXR in am  Fluid and electrolyte balance: Hypokalemia Plan F/u chem   Lower extremity edema left greater than right No evidence of DVT Plan Supportive care DVT prophylaxis with lovenox   Thoracic aortic  aneurysm Noted on CT chest during this admission.   Plan Outpatient follow-up  History HTN Plan Holding home BP meds for now   Best Practice (right click and "Reselect all SmartList Selections" daily)   Diet/type: NPO DVT prophylaxis: LMWH GI prophylaxis: PPI Lines: N/A Foley:  N/A Code Status:  full code Last date of multidisciplinary goals of care discussion [we will call and update family]   This patient is critically ill with multiple organ system failure; which, requires frequent high complexity decision making, assessment, support, evaluation, and titration of therapies. This was completed through the application of advanced monitoring technologies and extensive interpretation of multiple databases. During this encounter critical care time was devoted to patient care services described in this note for 33 minutes.

## 2022-01-08 ENCOUNTER — Inpatient Hospital Stay (HOSPITAL_COMMUNITY): Payer: Medicare HMO

## 2022-01-08 DIAGNOSIS — J690 Pneumonitis due to inhalation of food and vomit: Secondary | ICD-10-CM

## 2022-01-08 DIAGNOSIS — F10939 Alcohol use, unspecified with withdrawal, unspecified: Secondary | ICD-10-CM

## 2022-01-08 DIAGNOSIS — G40901 Epilepsy, unspecified, not intractable, with status epilepticus: Secondary | ICD-10-CM | POA: Diagnosis not present

## 2022-01-08 DIAGNOSIS — G9341 Metabolic encephalopathy: Secondary | ICD-10-CM

## 2022-01-08 LAB — GLUCOSE, CAPILLARY
Glucose-Capillary: 105 mg/dL — ABNORMAL HIGH (ref 70–99)
Glucose-Capillary: 110 mg/dL — ABNORMAL HIGH (ref 70–99)
Glucose-Capillary: 122 mg/dL — ABNORMAL HIGH (ref 70–99)

## 2022-01-08 LAB — COMPREHENSIVE METABOLIC PANEL
ALT: 24 U/L (ref 0–44)
AST: 23 U/L (ref 15–41)
Albumin: 3 g/dL — ABNORMAL LOW (ref 3.5–5.0)
Alkaline Phosphatase: 60 U/L (ref 38–126)
Anion gap: 11 (ref 5–15)
BUN: 10 mg/dL (ref 6–20)
CO2: 23 mmol/L (ref 22–32)
Calcium: 8.7 mg/dL — ABNORMAL LOW (ref 8.9–10.3)
Chloride: 101 mmol/L (ref 98–111)
Creatinine, Ser: 0.69 mg/dL (ref 0.61–1.24)
GFR, Estimated: >60 mL/min (ref >60)
Glucose, Bld: 105 mg/dL — ABNORMAL HIGH (ref 70–99)
Potassium: 3.4 mmol/L — ABNORMAL LOW (ref 3.5–5.1)
Sodium: 135 mmol/L (ref 135–145)
Total Bilirubin: 0.8 mg/dL (ref 0.3–1.2)
Total Protein: 6.8 g/dL (ref 6.5–8.1)

## 2022-01-08 LAB — CBC
HCT: 41.6 % (ref 39.0–52.0)
Hemoglobin: 13.6 g/dL (ref 13.0–17.0)
MCH: 27 pg (ref 26.0–34.0)
MCHC: 32.7 g/dL (ref 30.0–36.0)
MCV: 82.7 fL (ref 80.0–100.0)
Platelets: 336 10*3/uL (ref 150–400)
RBC: 5.03 MIL/uL (ref 4.22–5.81)
RDW: 12.4 % (ref 11.5–15.5)
WBC: 9.1 10*3/uL (ref 4.0–10.5)
nRBC: 0 % (ref 0.0–0.2)

## 2022-01-08 MED ORDER — PHENOBARBITAL 32.4 MG PO TABS
32.4000 mg | ORAL_TABLET | Freq: Every day | ORAL | Status: AC
  Administered 2022-01-09: 32.4 mg via ORAL
  Filled 2022-01-08: qty 1

## 2022-01-08 MED ORDER — ACETAMINOPHEN 325 MG PO TABS
650.0000 mg | ORAL_TABLET | ORAL | Status: DC | PRN
  Administered 2022-01-08 – 2022-01-10 (×4): 650 mg via ORAL
  Filled 2022-01-08 (×5): qty 2

## 2022-01-08 MED ORDER — AMLODIPINE BESYLATE 10 MG PO TABS
10.0000 mg | ORAL_TABLET | Freq: Every day | ORAL | Status: DC
  Administered 2022-01-08 – 2022-01-10 (×3): 10 mg via ORAL
  Filled 2022-01-08 (×3): qty 1

## 2022-01-08 MED ORDER — POTASSIUM CHLORIDE CRYS ER 20 MEQ PO TBCR
40.0000 meq | EXTENDED_RELEASE_TABLET | Freq: Once | ORAL | Status: AC
  Administered 2022-01-08: 40 meq via ORAL
  Filled 2022-01-08: qty 2

## 2022-01-08 MED ORDER — KETOROLAC TROMETHAMINE 30 MG/ML IJ SOLN
30.0000 mg | Freq: Once | INTRAMUSCULAR | Status: AC
  Administered 2022-01-08: 30 mg via INTRAVENOUS
  Filled 2022-01-08: qty 1

## 2022-01-08 MED ORDER — POTASSIUM CHLORIDE 20 MEQ PO PACK: 40.0000 meq | PACK | ORAL | Status: DC

## 2022-01-08 MED ORDER — POTASSIUM CHLORIDE 10 MEQ/100ML IV SOLN
10.0000 meq | INTRAVENOUS | Status: DC
  Administered 2022-01-08: 10 meq via INTRAVENOUS
  Filled 2022-01-08 (×2): qty 100

## 2022-01-08 MED ORDER — CHLORDIAZEPOXIDE HCL 5 MG PO CAPS
25.0000 mg | ORAL_CAPSULE | Freq: Three times a day (TID) | ORAL | Status: AC
  Administered 2022-01-09 (×3): 25 mg via ORAL
  Filled 2022-01-08 (×3): qty 5

## 2022-01-08 MED ORDER — LIDOCAINE 5 % EX PTCH
1.0000 | MEDICATED_PATCH | Freq: Every day | CUTANEOUS | Status: DC
  Administered 2022-01-08 – 2022-01-09 (×2): 1 via TRANSDERMAL
  Filled 2022-01-08 (×3): qty 1

## 2022-01-08 MED ORDER — LORAZEPAM 1 MG PO TABS: 1.0000 mg | ORAL_TABLET | ORAL | Status: DC | PRN

## 2022-01-08 MED ORDER — CHLORDIAZEPOXIDE HCL 5 MG PO CAPS: 25.0000 mg | ORAL_CAPSULE | Freq: Every day | ORAL | Status: DC

## 2022-01-08 MED ORDER — LORAZEPAM 2 MG/ML IJ SOLN
1.0000 mg | INTRAMUSCULAR | Status: DC | PRN
  Administered 2022-01-09 (×2): 2 mg via INTRAVENOUS
  Filled 2022-01-08 (×2): qty 1

## 2022-01-08 MED ORDER — CHLORDIAZEPOXIDE HCL 5 MG PO CAPS
25.0000 mg | ORAL_CAPSULE | ORAL | Status: DC
  Administered 2022-01-10: 25 mg via ORAL
  Filled 2022-01-08: qty 5

## 2022-01-08 MED ORDER — CHLORDIAZEPOXIDE HCL 5 MG PO CAPS
25.0000 mg | ORAL_CAPSULE | Freq: Four times a day (QID) | ORAL | Status: AC
  Administered 2022-01-08 (×3): 25 mg via ORAL
  Filled 2022-01-08: qty 5
  Filled 2022-01-08 (×2): qty 1

## 2022-01-08 NOTE — Progress Notes (Signed)
Attempted to call report, receiving nurse was in the room with another patient and left my phone number so she can call me back when ready for report.

## 2022-01-08 NOTE — Progress Notes (Signed)
Patient transferred to [place] with belongings. Patient sent cell phone home earlier today with a friend.

## 2022-01-08 NOTE — Progress Notes (Signed)
Speech Language Pathology Treatment: Dysphagia  Patient Details Name: Andrew Haley MRN: [id number] DOB: 1965/02/12 Today's Date: 01/08/2022 Time: 1205-1230 SLP Time Calculation (min) (ACUTE ONLY): 25 min  Assessment / Plan / Recommendation Clinical Impression  Patient seen by SLP for skilled treatment focused on dysphagia goals. Patient was awake and alert, sitting in recliner. When SLP arrived, he was trying to pull blankets that had fallen from his lap onto the floor. He picked up his cell phone and carelessly tried to place it on table but it instead fell on the floor. Patient was cooperative but patient seemed anxious, at times emotionally labile and cursing at his current situation and at the same time asking SLP when he can go home as he is concerned about his 12 horses, telling SLP "My horses are dying!" His voice was hoarse but patient able to be understood without significant difficulty. Patient was very receptive to having some thin liquids (water, ginger ale), puree solids (applesauce) and regular solids (graham cracker). He was able to hold food items and drinks in left hand but unable to use right hand secondary to weakness. He consumed liquids via consecutive cups sips followed by straw sips. Although he did exhibit intermittent cough/throat clearing, this appeared more due to likely secretions/phlegm rather than aspiration/penetration. Mastication and oral transit of regular solids was mildly prolonged but efficient and no oral residuals post initial swallows. SLP is recommending to initiate PO diet of Dys 3 solids (mechanical soft) thin liquids and will follow for diet toleration. SLP also recommending that patient be supervised and assisted with PO's and for PO intake to cease if patient exhibiting excessive coughing or throat clearing, as well as if RR increases or SpO2 decreases.     HPI HPI: Pt is a 56 yo M presenting 11/22 to [facility] after [place]  horse [place]. ETT 11/22-11/24. Pt with encephalopathy s/p cocaine induced seizure, suspected element of withdrawal, acute respiratory failure due to AMS, and CAP vs aspiration PNA. MRI Brain negative. PMH includes: obesity, ETOH and polysubstance abuse, HTN, prostate CA, OSA      SLP Plan  Continue with current plan of care      Recommendations for follow up therapy are one component of a multi-disciplinary discharge planning process, led by the attending physician.  Recommendations may be updated based on patient status, additional functional criteria and insurance authorization.    Recommendations  Diet recommendations: Dysphagia 3 (mechanical soft);Thin liquid Liquids provided via: Cup;Straw Medication Administration: Whole meds with puree Supervision: Patient able to self feed;Full supervision/cueing for compensatory strategies;Staff to assist with self feeding Compensations: Slow rate;Small sips/bites;Minimize environmental distractions Postural Changes and/or Swallow Maneuvers: Seated upright 90 degrees                Oral Care Recommendations: Oral care BID;Staff/trained caregiver to provide oral care Follow Up Recommendations: Follow physician's recommendations for discharge plan and follow up therapies (TBD) Assistance recommended at discharge: None SLP Visit Diagnosis: Dysphagia, unspecified (R13.10) Plan: Continue with current plan of care         Sonia Baller, MA, CCC-SLP Speech Therapy

## 2022-01-08 NOTE — Progress Notes (Signed)
Pt admitted to 3W18 at this time.  Alert and oriented x 4. Family at bedside.  C/o pain to rt hand/arm which is swollen.  Bed alarm set and pt verbalizes understanding to call before getting out of bed.  Tele placed on patient and CCMD called.  Second verifier will call.

## 2022-01-08 NOTE — Progress Notes (Signed)
NAME:  Andrew Haley, MRN:  [id number], DOB:  05-30-1965, LOS: 7 ADMISSION DATE:  01/01/2022, CONSULTATION DATE:  01/08/22 REFERRING MD:  Sabra Heck, CHIEF COMPLAINT:  AMS   History of Present Illness:  Andrew Haley is a 56 y.o. M with PMH of obesity, ETOH and polysubstance abuse, HTN, prostate Ca and OSA who presented to [facility] after being [place] unresponsive [place] the [place] horse stable.  EMS found patient tachycardic with decreased respirations, Narcan was administered which improved mental status marginally.  He has recently been prescribed oxycodone for L ankle chronic pain, but denied taking more than prescribed.  He also noted significant LLE edema and has recently been taking Lasix.  He seized in the ED and  was significantly hypercarbic in the ED and was intubated.  Work-up significant for WBC 17k, Creatinine 1.2, ETOH <10, CT chest without clear PE, patchy opacities bilaterally and 4.8cm ascending thoracic aortic aneurysm.  PCCM consult and transfer to [facility]  Pertinent  Medical History   has a past medical history of Arthritis, Class 1 obesity (40/98/1191), Complication of anesthesia, Essential hypertension (07/18/2020), Headache, Hypertension, Hypertriglyceridemia, Numbness of fingers of both hands, Prostate cancer ([place]) (2017), and Sleep apnea.   Significant Hospital Events: Including procedures, antibiotic start and stop dates in addition to other pertinent events   11/22 presented to [facility], AMS, Sz, PNA intubated. Transfer to [facility] 11/23 through 11/24: Continued on LTM.  MRI and EEG negative for seizure.  UDS was positive for cocaine.  Neurology felt seizure was secondary to cocaine induced seizure and advised against anticonvulsant therapy 11/24 successfully extubated 11/25: Was hypertensive and agitated this morning.  Got some Ativan.  Starting Librium & Phenobarb taper. Was briefly on precedex but this was stopped.  11/27 still agitated, unresponsive to voice, moves spontaneously    Interim History / Subjective:  Complaining of R elbow pain No acute events overnight.   Objective   Blood pressure 116/79, pulse 81, temperature 97.7 F (36.5 C), temperature source Oral, resp. rate (!) 23, height '6\' 2"'$  (1.88 m), weight 115.1 kg, SpO2 93 %.       Intake/Output Summary (Last 24 hours) at 01/08/2022 0731 Last data filed at 01/08/2022 0600 Gross per 24 hour  Intake 792.97 ml  Output 850 ml  Net -57.03 ml    Filed Weights   01/03/22 0500 01/04/22 0500 01/05/22 0453  Weight: 122.5 kg 119.8 kg 115.1 kg   Examination:  General: adult male in bed, NAD HEENT: /AT PERRL no JVD Pulmonary: Clear bilateral breath sounds, no distress.  Cardiac: RRR, no MRG Abdomen: Soft, NT, ND Extremities: No acute deformity, no ROM limitation.  Neuro: Oriented x 3. Drowsy.  Imaging  CT head 11/26 : Stable and normal for age noncontrast CT appearance of the brain. Progressive paranasal sinus inflammation. Chronic anterior nasal septal perforation. XRay right elbow 11/28 : no acute findings.   Resolved Hospital Problem list   Acute hypercarbic respiratory failure Sepsis which was present on admission   Assessment & Plan:   Acute Encephalopathy status post cocaine induced seizure History substance abuse, suspect element of withdrawal (which is now the driving factor) Plan: Continue Precedex and wean as able  Was hoping we would be able to wean this today, but seems unlikely as he continues to writhe in bed despite drip.  Continue Librium taper Continue phenobarbital taper Continue thiamine, folate PRN ativan  Goal RASS 0 to -1. Patient not sleeping due to insomnia plus being awoken for  neuro checks when he does sleep. Will decrease frequency of neuro checks to every 4 hours.   Acute respiratory failure due to altered mental status CAP vs Aspiration-->abundant  staph Plan Complete 5 days of antibiotics De-escalated to Ancef - stop date in place  Monitor respiratory  status closely  Mobilize as able once agitation improved  F/u CXR in am   Fluid and electrolyte balance: Hypokalemia Plan F/u chem   Lower extremity edema left greater than right No evidence of DVT, dopplers negative Plan Supportive care DVT prophylaxis with lovenox   R elbow pain CXR negative Plan PRN fentanyl low dose  Thoracic aortic aneurysm Noted on CT chest during this admission.   Plan Outpatient follow-up  History HTN Plan Losartan  Nutrition: Plan For MBS today  Best Practice (right click and "Reselect all SmartList Selections" daily)   Diet/type: NPO DVT prophylaxis: LMWH GI prophylaxis: PPI Lines: N/A Foley:  N/A Code Status:  full code Last date of multidisciplinary goals of care discussion [we will call and update family]  Critical care time: 32 minutes  Georgann Housekeeper, AGACNP-BC [place]  See Amion for personal pager PCCM on call pager [phone] until 7pm. Please call Elink 7p-7a. [phone]  01/08/2022 7:34 AM

## 2022-01-09 ENCOUNTER — Inpatient Hospital Stay (HOSPITAL_COMMUNITY): Payer: Medicare HMO

## 2022-01-09 DIAGNOSIS — G40901 Epilepsy, unspecified, not intractable, with status epilepticus: Secondary | ICD-10-CM | POA: Diagnosis not present

## 2022-01-09 NOTE — Progress Notes (Addendum)
PROGRESS NOTE    ANUP BRIGHAM  HQI:[id number] DOB: Mar 26, 1965 DOA: 01/01/2022 PCP: Redmond School, MD   Brief Narrative:  Andrew Haley is a 56 y.o. M with PMH of obesity, ETOH and polysubstance abuse, HTN, prostate CA and OSA who presented [facility] after being [place] the [place] horse [place].  EMS found patient tachycardic with decreased respirations, Narcan was administered which improved mental status marginally. Of note he had recently been placed on oxycodone for ankle pain.   11/22 presented to [facility] ED, AMS, Sz, PNA intubated. Transfer to [facility] 11/23 through 11/24: Continued on LTM.  MRI and EEG negative for seizure.  UDS was positive for cocaine.  Neurology felt seizure was secondary to cocaine induced seizure and advised against anticonvulsant therapy 11/24 successfully extubated 11/25: Was hypertensive and agitated this morning.  Got some Ativan.  Starting Librium & Phenobarb taper. Was briefly on precedex but this was stopped.  11/27 still agitated, unresponsive to voice, moves spontaneously  11/30 Mental status continues to improve - still impulsive and threatening to leave AMA despite having difficulty with ambulation, dysphagia, and ongoing confusion.   Assessment & Plan:   Principal Problem:   Status epilepticus ([place]) Active Problems:   Acute respiratory failure with hypoxia (HCC)   Alcohol withdrawal syndrome, with delirium (HCC)   Aspiration pneumonia of both lower lobes (HCC)   Alcohol withdrawal seizure with complication (HCC)   Encephalopathy status post cocaine induced seizure History substance abuse, suspect ongoing withdrawal Precedex weaned off prior to transfer out of ICU Continue Librium taper Continue phenobarbital taper Continue thiamine, folate PRN ativan  Acute respiratory failure due to altered mental status CAP vs Aspiration-->abundant  staph Completed antibiotic course Wean oxygen as tolerated - walking oxygen screen pending PT  eval   Fluid and electrolyte balance: Hypokalemia Follow repeat labs   Lower extremity edema left greater than right No evidence of DVT, dopplers negative DVT prophylaxis with lovenox ongoing   R elbow/shoulder pain Imaging negative Non-narcotic analgesics ongoing   Thoracic aortic aneurysm Noted on CT chest during this admission.   Outpatient follow-up   HTN Losartan   DVT prophylaxis: lovenox Code Status: Full Family Communication: Brother at bedside  Status is: Inpt  Dispo: The patient is from: Home              Anticipated d/c is to: TBD              Anticipated d/c date is: 24-48h pending clinical course              Patient currently NOT medically stable for discharge  Consultants:  PCCM  Procedures:  None  Antimicrobials:  Completed   Subjective: No acute issues/events overnight  Objective: Vitals:   01/08/22 1700 01/08/22 1916 01/08/22 2316 01/09/22 0453  BP: (!) 146/88 121/84 127/78   Pulse: (!) 109 97 98   Resp:  16 (!) 22   Temp: (!) 100.8 F (38.2 C) 99 F (37.2 C) 98.3 F (36.8 C)   TempSrc: Axillary Oral Axillary   SpO2:  99% 98%   Weight:    112.2 kg  Height:        Intake/Output Summary (Last 24 hours) at 01/09/2022 0727 Last data filed at 01/08/2022 1300 Gross per 24 hour  Intake 320.7 ml  Output --  Net 320.7 ml   Filed Weights   01/04/22 0500 01/05/22 0453 01/09/22 0453  Weight: 119.8 kg 115.1 kg 112.2 kg  Examination:  General exam: Appears calm and comfortable  Respiratory system: Clear to auscultation. Respiratory effort normal. Cardiovascular system: S1 & S2 heard, RRR. No JVD, murmurs, rubs, gallops or clicks. No pedal edema. Gastrointestinal system: Abdomen is nondistended, soft and nontender. No organomegaly or masses felt. Normal bowel sounds heard. Central nervous system: Alert and oriented. No focal neurological deficits. Extremities: Symmetric 5 x 5 power. Skin: No rashes, lesions or ulcers Psychiatry:  Judgement and insight appear normal. Mood & affect appropriate.     Data Reviewed: I have personally reviewed following labs and imaging studies  CBC: Recent Labs  Lab 01/04/22 0408 01/05/22 0450 01/05/22 0625 01/05/22 2223 01/06/22 0329 01/07/22 0341 01/08/22 0736  WBC 6.8 6.1  --   --  8.3 9.6 9.1  HGB 10.1* 11.7* 10.5* 12.2* 12.3* 13.4 13.6  HCT 32.2* 35.2* 31.0* 36.0* 37.6* 41.3 41.6  MCV 87.5 83.8  --   --  82.8 83.1 82.7  PLT 213 228  --   --  288 333 017   Basic Metabolic Panel: Recent Labs  Lab 01/02/22 0730 01/03/22 0526 01/04/22 0408 01/05/22 0450 01/05/22 0625 01/05/22 2223 01/06/22 0329 01/08/22 0736  NA 140 141 140 141 140 137 136 135  K 3.4* 3.3* 3.8 3.5 3.4* 3.7 3.4* 3.4*  CL 102 103 103 104  --   --  98 101  CO2 '28 26 28 26  '$ --   --  23 23  GLUCOSE 108* 90 99 110*  --   --  106* 105*  BUN '17 15 8 '$ 5*  --   --  6 10  CREATININE 0.89 0.90 0.65 0.54*  --   --  0.62 0.69  CALCIUM 8.3* 8.6* 8.8* 9.1  --   --  8.9 8.7*  MG 1.7 2.0 2.1 1.7  --   --  1.9  --   PHOS 3.1 3.3 3.8 4.3  --   --  3.5  --    GFR: Estimated Creatinine Clearance: 135.7 mL/min (by C-G formula based on SCr of 0.69 mg/dL). Liver Function Tests: Recent Labs  Lab 01/03/22 0526 01/05/22 0609 01/06/22 0329 01/08/22 0736  AST '23 21 21 23  '$ ALT '14 16 18 24  '$ ALKPHOS 46 49 51 60  BILITOT 0.4 0.6 0.7 0.8  PROT 5.5* 6.5 6.7 6.8  ALBUMIN 2.7* 2.9* 3.2* 3.0*   No results for input(s): "LIPASE", "AMYLASE" in the last 168 hours. Recent Labs  Lab 01/05/22 0609 01/06/22 0329  AMMONIA 39* 27   Coagulation Profile: No results for input(s): "INR", "PROTIME" in the last 168 hours. Cardiac Enzymes: No results for input(s): "CKTOTAL", "CKMB", "CKMBINDEX", "TROPONINI" in the last 168 hours. BNP (last 3 results) No results for input(s): "PROBNP" in the last 8760 hours. HbA1C: No results for input(s): "HGBA1C" in the last 72 hours. CBG: Recent Labs  Lab 01/07/22 1945 01/07/22 2315  01/08/22 0724 01/08/22 1152 01/08/22 1504  GLUCAP 95 87 105* 110* 122*   Lipid Profile: No results for input(s): "CHOL", "HDL", "LDLCALC", "TRIG", "CHOLHDL", "LDLDIRECT" in the last 72 hours. Thyroid Function Tests: No results for input(s): "TSH", "T4TOTAL", "FREET4", "T3FREE", "THYROIDAB" in the last 72 hours. Anemia Panel: No results for input(s): "VITAMINB12", "FOLATE", "FERRITIN", "TIBC", "IRON", "RETICCTPCT" in the last 72 hours. Sepsis Labs: Recent Labs  Lab 01/02/22 0730 01/05/22 0609 01/05/22 0808  PROCALCITON 2.10  --   --   LATICACIDVEN  --  0.7 0.6    Recent Results (from the past 240 hour(s))  Blood Culture (routine x 2)     Status: None   Collection Time: 01/01/22  8:48 PM   Specimen: BLOOD  Result Value Ref Range Status   Specimen Description BLOOD BLOOD LEFT HAND  Final   Special Requests   Final    BOTTLES DRAWN AEROBIC AND ANAEROBIC Blood Culture adequate volume   Culture   Final    NO GROWTH 5 DAYS Performed at [facility], [address], [place], [place] [address]    Report Status 01/06/2022 FINAL  Final  Blood Culture (routine x 2)     Status: None   Collection Time: 01/01/22  8:49 PM   Specimen: BLOOD RIGHT HAND  Result Value Ref Range Status   Specimen Description BLOOD RIGHT HAND  Final   Special Requests   Final    BOTTLES DRAWN AEROBIC AND ANAEROBIC Blood Culture results may not be optimal due to an inadequate volume of blood received in culture bottles   Culture   Final    NO GROWTH 5 DAYS Performed at [facility], [address], [place], [place] [address]    Report Status 01/06/2022 FINAL  Final  Resp Panel by RT-PCR (Flu A&B, Covid) Anterior Nasal Swab     Status: None   Collection Time: 01/01/22  8:54 PM   Specimen: Anterior Nasal Swab  Result Value Ref Range Status   SARS Coronavirus 2 by RT PCR NEGATIVE NEGATIVE Final    Comment: (NOTE) SARS-CoV-2 target nucleic acids are NOT DETECTED.  The SARS-CoV-2 RNA is generally detectable  in upper respiratory specimens during the acute phase of infection. The lowest concentration of SARS-CoV-2 viral copies this assay can detect is 138 copies/mL. A negative result does not preclude SARS-Cov-2 infection and should not be used as the sole basis for treatment or other patient management decisions. A negative result may occur with  improper specimen collection/handling, submission of specimen other than nasopharyngeal swab, presence of viral mutation(s) within the areas targeted by this assay, and inadequate number of viral copies(<138 copies/mL). A negative result must be combined with clinical observations, patient history, and epidemiological information. The expected result is Negative.  Fact Sheet for Patients:  EntrepreneurPulse.com.au  Fact Sheet for Healthcare Providers:  IncredibleEmployment.be  This test is no t yet approved or cleared by the [place] FDA and  has been authorized for detection and/or diagnosis of SARS-CoV-2 by FDA under an Emergency Use Authorization (EUA). This EUA will remain  in effect (meaning this test can be used) for the duration of the COVID-19 declaration under Section 564(b)(1) of the Act, 21 U.S.C.section 360bbb-3(b)(1), unless the authorization is terminated  or revoked sooner.       Influenza A by PCR NEGATIVE NEGATIVE Final   Influenza B by PCR NEGATIVE NEGATIVE Final    Comment: (NOTE) The Xpert Xpress SARS-CoV-2/FLU/RSV plus assay is intended as an aid in the diagnosis of influenza from Nasopharyngeal swab specimens and should not be used as a sole basis for treatment. Nasal washings and aspirates are unacceptable for Xpert Xpress SARS-CoV-2/FLU/RSV testing.  Fact Sheet for Patients: EntrepreneurPulse.com.au  Fact Sheet for Healthcare Providers: IncredibleEmployment.be  This test is not yet approved or cleared by the [place] FDA and has  been authorized for detection and/or diagnosis of SARS-CoV-2 by FDA under an Emergency Use Authorization (EUA). This EUA will remain in effect (meaning this test can be used) for the duration of the COVID-19 declaration under Section 564(b)(1) of the Act, 21 U.S.C. section 360bbb-3(b)(1), unless  the authorization is terminated or revoked.  Performed at [facility], [address], [place], [place] [address]   Urine Culture     Status: None   Collection Time: 01/01/22  9:15 PM   Specimen: Urine, Clean Catch  Result Value Ref Range Status   Specimen Description   Final    URINE, CLEAN CATCH Performed at [facility], [address], [place], [place] [address]    Special Requests   Final    NONE Performed at [facility], [address] [place], [place] [address]    Culture   Final    NO GROWTH Performed at [facility], [place] [address], [place], [place] [address]    Report Status 01/03/2022 FINAL  Final  MRSA Next Gen by PCR, Nasal     Status: None   Collection Time: 01/01/22 11:42 PM   Specimen: Nasal Mucosa; Nasal Swab  Result Value Ref Range Status   MRSA by PCR Next Gen NOT DETECTED NOT DETECTED Final    Comment: (NOTE) The GeneXpert MRSA Assay (FDA approved for NASAL specimens only), is one component of a comprehensive MRSA colonization surveillance program. It is not intended to diagnose MRSA infection nor to guide or monitor treatment for MRSA infections. Test performance is not FDA approved in patients less than 25 years old. Performed at [facility], [place] [address] [place], [place] [address]   Culture, Respiratory w Gram Stain     Status: None   Collection Time: 01/02/22 12:07 AM   Specimen: Tracheal Aspirate; Respiratory  Result Value Ref Range Status   Specimen Description TRACHEAL ASPIRATE  Final   Special Requests NONE  Final   Gram Stain   Final    MODERATE WBC PRESENT,BOTH PMN AND MONONUCLEAR MODERATE GRAM POSITIVE COCCI IN PAIRS AND  CHAINS FEW GRAM NEGATIVE RODS    Culture   Final    ABUNDANT STAPHYLOCOCCUS AUREUS No Pseudomonas species isolated Performed at [facility], [place] [address], [place], [place] [address]    Report Status 01/05/2022 FINAL  Final   Organism ID, Bacteria STAPHYLOCOCCUS AUREUS  Final      Susceptibility   Staphylococcus aureus - MIC*    CIPROFLOXACIN <=0.5 SENSITIVE Sensitive     ERYTHROMYCIN <=0.25 SENSITIVE Sensitive     GENTAMICIN <=0.5 SENSITIVE Sensitive     OXACILLIN 0.5 SENSITIVE Sensitive     TETRACYCLINE <=1 SENSITIVE Sensitive     VANCOMYCIN 1 SENSITIVE Sensitive     TRIMETH/SULFA <=10 SENSITIVE Sensitive     CLINDAMYCIN <=0.25 SENSITIVE Sensitive     RIFAMPIN <=0.5 SENSITIVE Sensitive     Inducible Clindamycin NEGATIVE Sensitive     * ABUNDANT STAPHYLOCOCCUS AUREUS         Radiology Studies: DG Chest Port 1 View  Result Date: 01/08/2022 CLINICAL DATA:  [id number] with community-acquired pneumonia. EXAM: PORTABLE CHEST 1 VIEW COMPARISON:  Portable chest 01/06/2022 FINDINGS: There is a low inspiration. Perihilar linear atelectasis is again noted on the right, with elevated right hemidiaphragm. There is limited visualization of the lung bases. The visualized lungs are otherwise clear There is cardiomegaly without findings of CHF. Aortic tortuosity with stable mediastinum. No acute osseous findings. Overall aeration is unchanged.  No new abnormality. IMPRESSION: Limited visualization of the lung bases due to low volumes. Stable cardiomegaly and aortic tortuosity. Stable right perihilar atelectasis. No new abnormality.  The visualized lungs are otherwise clear. Electronically Signed   By: Telford Nab M.D.   On:  01/08/2022 06:03   DG Elbow 2 Views Right  Result Date: 01/07/2022 CLINICAL DATA:  Pain and swelling EXAM: RIGHT ELBOW - 2 VIEW COMPARISON:  None Available. FINDINGS: There is no evidence of fracture, dislocation, or joint effusion. Spurring from the radial head, and  lateral epicondyle. There is no other evidence of arthropathy or other focal bone abnormality. Soft tissues are unremarkable. IMPRESSION: 1. No acute findings. 2. Mild degenerative spurring. Electronically Signed   By: Lucrezia Europe M.D.   On: 01/07/2022 15:43        Scheduled Meds:  amLODipine  10 mg Oral Daily   chlordiazePOXIDE  25 mg Oral TID   Followed by   Derrill Memo ON 01/10/2022] chlordiazePOXIDE  25 mg Oral BH-qamhs   Followed by   Derrill Memo ON 01/11/2022] chlordiazePOXIDE  25 mg Oral Daily   Chlorhexidine Gluconate Cloth  6 each Topical Daily   enoxaparin (LOVENOX) injection  40 mg Subcutaneous Daily   folic acid  1 mg Oral Daily   lidocaine  1 patch Transdermal Daily   losartan  100 mg Oral Daily   multivitamin with minerals  1 tablet Oral Daily   mouth rinse  15 mL Mouth Rinse 4 times per day   phenobarbital  32.4 mg Oral Daily   senna-docusate  2 tablet Oral BID   thiamine  100 mg Oral Daily   Continuous Infusions:  sodium chloride Stopped (01/08/22 0955)   sodium chloride Stopped (01/07/22 0500)     LOS: 8 days   Time spent: 44mn  Kain Milosevic C Avanell Banwart, DO [facility]  If 7PM-7AM, please contact night-coverage [email or web address]  01/09/2022, 7:27 AM

## 2022-01-09 NOTE — Care Management Important Message (Signed)
Important Message  Patient Details  Name: Andrew Haley MRN: [id number] Date of Birth: Mar 30, 1965   Medicare Important Message Given:  Yes     Kohlton Gilpatrick [name] 01/09/2022, 2:49 PM

## 2022-01-09 NOTE — Progress Notes (Signed)
Attempted to see patient x 2 for substance abuse and to do assessment without success.  Pt keeps threatening to leave AMA.  TOC will follow if patient doesn't leave tonight.

## 2022-01-09 NOTE — Progress Notes (Signed)
Modified Barium Swallow Progress Note  Patient Details  Name: Andrew Haley MRN: [id number] Date of Birth: 1965-06-17  Today's Date: 01/09/2022  Modified Barium Swallow completed.  Full report located under Chart Review in the Imaging Section.  Brief recommendations include the following:  Clinical Impression  Pt demonstrates delayed swallowing with spillage of thin liquids to the pyriform sinuses before the swallow. If the bolus is large or pt drinks consecutively, this worsens timing and also overfills the pyriforms before the swallow leading to vestibular penetration and aspiration as swallow is initiated. Pt has intermittent sensation; cough is typically present, but is often delayed. Pts cough is strong and producitve. When taking single sips of thin liquids pt had no aspiration. Nectar thick liquids and solids also tolerated without impairment. SLP provided visual feedback and rationale though pt does appear impulsive, with decreased safety awareness. For now, recommend regular solids and nectar thick liquids with training to decrease sip size to start thin liquids.   Swallow Evaluation Recommendations       SLP Diet Recommendations: Regular solids;Nectar thick liquid   Liquid Administration via: Cup;No straw   Medication Administration: Whole meds with puree   Supervision: Patient able to self feed;Intermittent supervision to cue for compensatory strategies   Compensations: Slow rate;Small sips/bites   Postural Changes: Seated upright at 90 degrees   Oral Care Recommendations: Oral care BID        [name], Katherene Ponto 01/09/2022,2:12 PM

## 2022-01-09 NOTE — Discharge Summary (Signed)
Physician Discharge Summary  Andrew Haley VOJ:[id number] DOB: 01-12-1966 DOA: 01/01/2022  PCP: Redmond School, MD  Admit date: 01/01/2022 Discharge date: 01/10/2022  Admitted From: Home Disposition:  AMA  Recommendations for Outpatient Follow-up:  Follow up with PCP in 1-2 weeks Return to the hospital at the first sign of withdrawal, altered mental status or abnormal behavior  Discharge Condition:Guarded  CODE STATUS:FULL  Diet recommendation:As tolerated    Brief/Interim Summary: Andrew Haley is a 56 y.o. M with PMH of obesity, ETOH and polysubstance abuse, HTN, prostate CA and OSA who presented to [facility] ED after [place]. EMS found patient tachycardic with decreased respirations, Narcan was administered which improved mental status marginally.  He has recently been prescribed oxycodone for L ankle chronic pain, but denied taking more than prescribed.  He also noted significant LLE edema and has recently been taking Lasix.  He seized in the ED and  was significantly hypercarbic in the ED and was intubated.  Work-up significant for WBC 17k, Creatinine 1.2, ETOH <10, CT chest without clear PE, patchy opacities bilaterally and 4.8cm ascending thoracic aortic aneurysm.  PCCM consult and transfer to [facility]   11/22 presented to [facility] ED, AMS, Sz, PNA intubated. Transfer to [facility] 11/23 through 11/24: Continued on LTM.  MRI unremarkable and EEG negative for seizure.  UDS was positive for cocaine.  Neurology felt seizure was secondary to cocaine induced seizure and advised against anticonvulsant therapy 11/24 successfully extubated 11/25: Was hypertensive and agitated this morning.  Got some Ativan.  Starting Librium & Phenobarb taper. Was briefly on precedex but this was stopped.  11/27 still agitated, unresponsive to voice, moves spontaneously  11/30-12/1 patient transferred to hospitalist team and patient now more awake/alert refusing further care and has called  family to pick him up as he is "leaving no matter what" - we discussed that we would recommend he stay for closer monitoring, repeat labs and further testing given concern for ongoing withdrawals in the setting of polysubstance abuse and questionable seizure-like activity. Patient has convinced friend to pick him up - we recommend returning to the hospital at the first sign of mental status changes/withdrawals or any abnormal symptoms. He remains at markedly increased risk of decompensation and even death. Recommend the patient avoid driving in the interim until he can be evaluated by PCP or neurology to discuss driving per [place] guidelines.  Discharge Diagnoses:  Principal Problem:   Status epilepticus ([place]) Active Problems:   Acute respiratory failure with hypoxia (HCC)   Alcohol withdrawal syndrome, with delirium ([place])   Aspiration pneumonia of both lower lobes (HCC)   Alcohol withdrawal seizure with complication [facility])   Consultations: PCCM, neurology  Procedures/Studies: DG Swallowing Func-Speech Pathology  Result Date: 01/09/2022 Table formatting from the original result was not included. Images from the original result were not included. Objective Swallowing Evaluation: Type of Study: MBS-Modified Barium Swallow Study  Patient Details Name: Andrew Haley MRN: [id number] Date of Birth: [birth year] Today's Date: 01/09/2022 Time: SLP Start Time (ACUTE ONLY): 0950 -SLP Stop Time (ACUTE ONLY): 1020 SLP Time Calculation (min) (ACUTE ONLY): 30 min Past Medical History: Past Medical History: Diagnosis Date  Arthritis   Class 1 obesity 89/38/1017  Complication of anesthesia   slow to wake up  Essential hypertension 07/18/2020  Headache   Hypertension   Hypertriglyceridemia   Numbness of fingers of both hands   Prostate cancer ([place]) 2017  Sleep apnea   CPAP Past  Surgical History: Past Surgical History: Procedure Laterality Date  ANKLE SURGERY Left   COLONOSCOPY WITH PROPOFOL N/A 09/28/2017  Procedure:  COLONOSCOPY WITH PROPOFOL;  Surgeon: Daneil Dolin, MD;  Location: [facility] ENDO SUITE;  Service: Endoscopy;  Laterality: N/A;  12:00pm  HAND SURGERY Right 2003  cysts   JOINT REPLACEMENT    LESION REMOVAL Left 09/23/2013  Procedure: MINOR EXCISION 3 CM SKIN LESION OF LEFT THIGH ;  Surgeon: Jamesetta So, MD;  Location: [facility] ORS;  Service: General;  Laterality: Left;  LYMPHADENECTOMY Bilateral 11/29/2015  Procedure: LYMPHADENECTOMY;  Surgeon: Raynelle Bring, MD;  Location: [facility] ORS;  Service: Urology;  Laterality: Bilateral;  ROBOT ASSISTED LAPAROSCOPIC RADICAL PROSTATECTOMY N/A 11/29/2015  Procedure: XI ROBOTIC ASSISTED LAPAROSCOPIC RADICAL PROSTATECTOMY LEVEL 3;  Surgeon: Raynelle Bring, MD;  Location: [facility] ORS;  Service: Urology;  Laterality: N/A;  TOTAL KNEE ARTHROPLASTY Left 02/22/2014  Procedure: LEFT TOTAL KNEE ARTHROPLASTY;  Surgeon: Tobi Bastos, MD;  Location: [facility] ORS;  Service: Orthopedics;  Laterality: Left;  TOTAL KNEE ARTHROPLASTY Right 05/23/2014  Procedure: RIGHT TOTAL KNEE ARTHROPLASTY;  Surgeon: Latanya Maudlin, MD;  Location: [facility] ORS;  Service: Orthopedics;  Laterality: Right;  TOTAL KNEE REVISION Right 03/04/2021  Procedure: TOTAL KNEE REVISION;  Surgeon: Paralee Cancel, MD;  Location: [facility] ORS;  Service: Orthopedics;  Laterality: Right; HPI: Pt is a 56 yo M presenting 11/22 to [facility] after [place]. ETT 11/22-11/24. Pt with encephalopathy s/p cocaine induced seizure, suspected element of withdrawal, acute respiratory failure due to AMS, and CAP vs aspiration PNA. MRI Brain negative. PMH includes: obesity, ETOH and polysubstance abuse, HTN, prostate CA, OSA  Subjective: tearful, very distracted  Recommendations for follow up therapy are one component of a multi-disciplinary discharge planning process, led by the attending physician.  Recommendations may be updated based on patient status, additional functional criteria and insurance authorization. Assessment / Plan  / Recommendation   01/09/2022   2:00 PM Clinical Impressions Clinical Impression Pt demonstrates delayed swallowing with spillage of thin liquids to the pyriform sinuses before the swallow. If the bolus is large or pt drinks consecutively, this worsens timing and also overfills the pyriforms before the swallow leading to vestibular penetration and aspiration as swallow is initiated. Pt has intermittent sensation; cough is typically present, but is often delayed. Pts cough is strong and producitve. When taking single sips of thin liquids pt had no aspiration. Nectar thick liquids and solids also tolerated without impairment. SLP provided visual feedback and rationale though pt does appear impulsive, with decreased safety awareness. For now, recommend regular solids and nectar thick liquids with training to decrease sip size to start thin liquids. SLP Visit Diagnosis Dysphagia, oropharyngeal phase (R13.12) Impact on safety and function Moderate aspiration risk     01/09/2022   2:00 PM Treatment Recommendations Treatment Recommendations Therapy as outlined in treatment plan below     01/09/2022   2:00 PM Prognosis Prognosis for Safe Diet Advancement Good Barriers to Reach Goals Cognitive deficits   01/09/2022   2:00 PM Diet Recommendations SLP Diet Recommendations Regular solids;Nectar thick liquid Liquid Administration via Cup;No straw Medication Administration Whole meds with puree Compensations Slow rate;Small sips/bites Postural Changes Seated upright at 90 degrees     01/09/2022   2:00 PM Other Recommendations Oral Care Recommendations Oral care BID Follow Up Recommendations Outpatient SLP Functional Status Assessment Patient has had a recent decline in their functional status and demonstrates the ability to make significant improvements in  function in a reasonable and predictable amount of time.   01/09/2022   2:00 PM Frequency and Duration  Speech Therapy Frequency (ACUTE ONLY) min 2x/week Treatment Duration 2  weeks     01/09/2022   2:00 PM Oral Phase Oral Phase [facility]    01/09/2022   2:00 PM Pharyngeal Phase Pharyngeal Phase Impaired Pharyngeal- Nectar Straw Delayed swallow initiation-pyriform sinuses Pharyngeal- Thin Cup Delayed swallow initiation-pyriform sinuses;Penetration/Aspiration before swallow;Moderate aspiration Pharyngeal Material enters airway, passes BELOW cords without attempt by patient to eject out (silent aspiration);Material enters airway, passes BELOW cords then ejected out;Material does not enter airway Pharyngeal- Thin Straw Penetration/Aspiration before swallow;Delayed swallow initiation-pyriform sinuses;Moderate aspiration Pharyngeal Material enters airway, passes BELOW cords then ejected out Pharyngeal- Puree WFL Pharyngeal- Regular WFL     No data to display    DeBlois, Katherene Ponto 01/09/2022, 2:13 PM                     DG Chest Port 1 View  Result Date: 01/08/2022 CLINICAL DATA:  [id number] with community-acquired pneumonia. EXAM: PORTABLE CHEST 1 VIEW COMPARISON:  Portable chest 01/06/2022 FINDINGS: There is a low inspiration. Perihilar linear atelectasis is again noted on the right, with elevated right hemidiaphragm. There is limited visualization of the lung bases. The visualized lungs are otherwise clear There is cardiomegaly without findings of CHF. Aortic tortuosity with stable mediastinum. No acute osseous findings. Overall aeration is unchanged.  No new abnormality. IMPRESSION: Limited visualization of the lung bases due to low volumes. Stable cardiomegaly and aortic tortuosity. Stable right perihilar atelectasis. No new abnormality.  The visualized lungs are otherwise clear. Electronically Signed   By: Telford Nab M.D.   On: 01/08/2022 06:03   DG Elbow 2 Views Right  Result Date: 01/07/2022 CLINICAL DATA:  Pain and swelling EXAM: RIGHT ELBOW - 2 VIEW COMPARISON:  None Available. FINDINGS: There is no evidence of fracture, dislocation, or joint effusion. Spurring from the  radial head, and lateral epicondyle. There is no other evidence of arthropathy or other focal bone abnormality. Soft tissues are unremarkable. IMPRESSION: 1. No acute findings. 2. Mild degenerative spurring. Electronically Signed   By: Lucrezia Europe M.D.   On: 01/07/2022 15:43   DG Chest Port 1 View  Result Date: 01/06/2022 CLINICAL DATA:  Altered mental status.  Pneumonia. EXAM: PORTABLE CHEST 1 VIEW COMPARISON:  01/05/2022 FINDINGS: Chronic cardiomegaly. Left lung remains clear except for minimal atelectasis at the base. Slight worsening of atelectasis at the right lung base. Upper lungs remain clear. IMPRESSION: Slight worsening of atelectasis at the right lung base. Minimal atelectasis at the left base. Electronically Signed   By: Nelson Chimes M.D.   On: 01/06/2022 08:03   DG CHEST PORT 1 VIEW  Result Date: 01/05/2022 CLINICAL DATA:  55 year old male with seizure disorder.  Tachypnea. EXAM: PORTABLE CHEST 1 VIEW COMPARISON:  Portable chest 01/03/2022 and earlier. FINDINGS: Portable AP supine view at 0553 hours. Extubated now. Lines and tubes removed. Lower lung volumes. Stable cardiac size and mediastinal contours. Mild cardiomegaly. Improved bibasilar ventilation and when allowing for portable technique the lungs are clear. No pneumothorax or pleural effusion. Negative visible bowel gas. No acute osseous abnormality identified. IMPRESSION: 1. Extubated, lines and tubes removed. 2. Improved bilateral ventilation since 01/03/2022 and no acute cardiopulmonary abnormality. Electronically Signed   By: Genevie Ann M.D.   On: 01/05/2022 06:29   CT HEAD WO CONTRAST (5MM)  Result Date: 01/05/2022 CLINICAL DATA:  56 year old male with seizure disorder.  EXAM: CT HEAD WITHOUT CONTRAST TECHNIQUE: Contiguous axial images were obtained from the base of the skull through the vertex without intravenous contrast. RADIATION DOSE REDUCTION: This exam was performed according to the departmental dose-optimization program  which includes automated exposure control, adjustment of the mA and/or kV according to patient size and/or use of iterative reconstruction technique. COMPARISON:  Brain MRI and head CT, CTA 01/02/2022. FINDINGS: Brain: Cerebral volume is within normal limits for age. No midline shift, ventriculomegaly, mass effect, evidence of mass lesion, intracranial hemorrhage or evidence of cortically based acute infarction. Gray-white matter differentiation is within normal limits throughout the brain. Vascular: No suspicious intracranial vascular hyperdensity. Skull: No acute osseous abnormality identified. Sinuses/Orbits: Bilateral maxillary sinus mucous retention cyst. Progressive ethmoid and sphenoid sinus opacification over the past 3 days. Patient no longer appears intubated on the scout view. Tympanic cavities and mastoid stable and well aerated. Other: Stable orbit and scalp soft tissues. Anterior nasal septal perforation. IMPRESSION: 1. Stable and normal for age noncontrast CT appearance of the brain. 2. Progressive paranasal sinus inflammation. 3. Chronic anterior nasal septal perforation. Electronically Signed   By: Genevie Ann M.D.   On: 01/05/2022 06:28   ECHOCARDIOGRAM COMPLETE  Result Date: 01/03/2022    ECHOCARDIOGRAM REPORT   Patient Name:   JAYMEN FETCH Date of Exam: 01/03/2022 Medical Rec #:  [id number]    Height:       74.0 in Accession #:    [id number]   Weight:       270.1 lb Date of Birth:  [birth year]   BSA:          2.469 m Patient Age:    64 years     BP:           124/72 mmHg Patient Gender: M            HR:           72 bpm. Exam Location:  Inpatient Procedure: 2D Echo and Intracardiac Opacification Agent Indications:    pulmonary embolus  History:        Patient has no prior history of Echocardiogram examinations.  Sonographer:    Harvie Junior Referring Phys: [id number] Candee Furbish  Sonographer Comments: Technically difficult study due to poor echo windows and patient is obese. Image acquisition  challenging due to patient body habitus. IMPRESSIONS  1. Left ventricular ejection fraction, by estimation, is 55 to 60%. The left ventricle has normal function. The left ventricle has no regional wall motion abnormalities. There is mild concentric left ventricular hypertrophy. Left ventricular diastolic parameters are indeterminate.  2. Right ventricular systolic function is normal. The right ventricular size is normal. Tricuspid regurgitation signal is inadequate for assessing PA pressure.  3. The mitral valve is normal in structure. No evidence of mitral valve regurgitation. No evidence of mitral stenosis.  4. The aortic valve was not well visualized. Aortic valve regurgitation is not visualized. Aortic valve mean gradient measures 8.0 mmHg.  5. The inferior vena cava is normal in size with greater than 50% respiratory variability, suggesting right atrial pressure of 3 mmHg. Comparison(s): No prior Echocardiogram. FINDINGS  Left Ventricle: Left ventricular ejection fraction, by estimation, is 55 to 60%. The left ventricle has normal function. The left ventricle has no regional wall motion abnormalities. Definity contrast agent was given IV to delineate the left ventricular  endocardial borders. The left ventricular internal cavity size was normal in size. There is mild concentric left ventricular hypertrophy. Left ventricular diastolic parameters  are indeterminate. Right Ventricle: The right ventricular size is normal. No increase in right ventricular wall thickness. Right ventricular systolic function is normal. Tricuspid regurgitation signal is inadequate for assessing PA pressure. Left Atrium: Left atrial size was normal in size. Right Atrium: Right atrial size was normal in size. Pericardium: There is no evidence of pericardial effusion. Mitral Valve: The mitral valve is normal in structure. No evidence of mitral valve regurgitation. No evidence of mitral valve stenosis. Tricuspid Valve: The tricuspid valve  is not well visualized. Tricuspid valve regurgitation is not demonstrated. No evidence of tricuspid stenosis. Aortic Valve: The aortic valve was not well visualized. Aortic valve regurgitation is not visualized. Aortic valve mean gradient measures 8.0 mmHg. Aortic valve peak gradient measures 15.7 mmHg. Aortic valve area, by VTI measures 2.14 cm. Pulmonic Valve: The pulmonic valve was not well visualized. Pulmonic valve regurgitation is not visualized. No evidence of pulmonic stenosis. Aorta: The aortic root and ascending aorta are structurally normal, with no evidence of dilitation and the aortic root is normal in size and structure. Venous: The inferior vena cava is normal in size with greater than 50% respiratory variability, suggesting right atrial pressure of 3 mmHg. IAS/Shunts: No atrial level shunt detected by color flow Doppler.  LEFT VENTRICLE PLAX 2D LVIDd:         5.30 cm      Diastology LVIDs:         3.30 cm      LV e' medial:    6.53 cm/s LV PW:         1.20 cm      LV E/e' medial:  18.4 LV IVS:        1.20 cm      LV e' lateral:   10.90 cm/s LVOT diam:     2.10 cm      LV E/e' lateral: 11.0 LV SV:         84 LV SV Index:   34 LVOT Area:     3.46 cm  LV Volumes (MOD) LV vol d, MOD A2C: 93.1 ml LV vol d, MOD A4C: 145.0 ml LV vol s, MOD A2C: 44.2 ml LV vol s, MOD A4C: 70.2 ml LV SV MOD A2C:     48.9 ml LV SV MOD A4C:     145.0 ml LV SV MOD BP:      64.0 ml RIGHT VENTRICLE RV S prime:     16.50 cm/s TAPSE (M-mode): 2.8 cm LEFT ATRIUM             Index        RIGHT ATRIUM           Index LA diam:        3.80 cm 1.54 cm/m   RA Area:     14.60 cm LA Vol (A2C):   63.1 ml 25.55 ml/m  RA Volume:   36.30 ml  14.70 ml/m LA Vol (A4C):   76.9 ml 31.14 ml/m LA Biplane Vol: 71.3 ml 28.87 ml/m  AORTIC VALVE                     PULMONIC VALVE AV Area (Vmax):    2.10 cm      PV Vmax:       1.20 m/s AV Area (Vmean):   2.09 cm      PV Peak grad:  5.8 mmHg AV Area (VTI):     2.14 cm AV Vmax:  198.00  cm/s AV Vmean:          129.500 cm/s AV VTI:            0.392 m AV Peak Grad:      15.7 mmHg AV Mean Grad:      8.0 mmHg LVOT Vmax:         120.00 cm/s LVOT Vmean:        78.300 cm/s LVOT VTI:          0.242 m LVOT/AV VTI ratio: 0.62  AORTA Ao Root diam: 3.70 cm Ao Asc diam:  3.80 cm MITRAL VALVE MV Area (PHT): 3.27 cm     SHUNTS MV Decel Time: 232 msec     Systemic VTI:  0.24 m MV E velocity: 120.00 cm/s  Systemic Diam: 2.10 cm MV A velocity: 121.00 cm/s MV E/A ratio:  0.99 Rudean Haskell MD Electronically signed by Rudean Haskell MD Signature Date/Time: 01/03/2022/2:12:00 PM    Final    DG Chest Port 1 View  Result Date: 01/03/2022 CLINICAL DATA:  56 year old male with respiratory failure. EXAM: PORTABLE CHEST 1 VIEW COMPARISON:  CTA chest yesterday and earlier. FINDINGS: Portable AP semi upright view at 0523 hours. Stable lines and tubes. Stable lung volumes. *INSERT* stable ventilation. Dense retrocardiac opacity, with bilateral lower lobe consolidation or less likely collapse on the CT yesterday. No superimposed pneumothorax or pulmonary edema. No definite pleural effusion. Stable visualized osseous structures. Negative visible bowel gas. IMPRESSION: 1. Stable lines and tubes. 2. Bilateral lower lobe consolidation not significantly changed from the CTA yesterday. No new cardiopulmonary abnormality. Electronically Signed   By: Genevie Ann M.D.   On: 01/03/2022 08:31   MR BRAIN WO CONTRAST  Result Date: 01/02/2022 CLINICAL DATA:  Stroke suspected; visual changes, moving left side less EXAM: MRI HEAD WITHOUT CONTRAST TECHNIQUE: Multiplanar, multiecho pulse sequences of the brain and surrounding structures were obtained without intravenous contrast. COMPARISON:  No prior MRI, correlation is made with CTA head neck 01/02/2022 FINDINGS: Brain: No restricted diffusion to suggest acute or subacute infarct. No acute hemorrhage, mass, mass effect, or midline shift. No hydrocephalus or extra-axial  collection. No hemosiderin deposition to suggest remote hemorrhage. Normal cerebral volume. Vascular: Normal arterial flow voids. Skull and upper cervical spine: Normal marrow signal. Sinuses/Orbits: Mucous retention cysts in the maxillary sinuses. Mucosal thickening in the sphenoid sinuses and ethmoid air cells. No acute finding in the orbits. Other: Fluid in the bilateral mastoid air cells. Fluid in the nasopharynx, likely related to intubation. IMPRESSION: No acute intracranial process. No evidence of acute or subacute infarct. Electronically Signed   By: Merilyn Baba M.D.   On: 01/02/2022 19:27   CT Angio Chest Pulmonary Embolism (PE) W or WO Contrast  Result Date: 01/02/2022 CLINICAL DATA:  Chest pain and shortness of breath. EXAM: CT ANGIOGRAPHY CHEST WITH CONTRAST TECHNIQUE: Multidetector CT imaging of the chest was performed using the standard protocol during bolus administration of intravenous contrast. Multiplanar CT image reconstructions and MIPs were obtained to evaluate the vascular anatomy. RADIATION DOSE REDUCTION: This exam was performed according to the departmental dose-optimization program which includes automated exposure control, adjustment of the mA and/or kV according to patient size and/or use of iterative reconstruction technique. CONTRAST:  89m OMNIPAQUE IOHEXOL 350 MG/ML SOLN COMPARISON:  01/01/2022 FINDINGS: Cardiovascular: The heart is borderline enlarged but stable. Fusiform aneurysmal dilatation of the ascending thoracic aorta with maximum measurement of 5 cm. Ascending thoracic aortic aneurysm. Recommend semi-annual imaging followup by CTA or  MRA and referral to cardiothoracic surgery if not already obtained. This recommendation follows 2010 ACCF/AHA/AATS/ACR/ASA/SCA/SCAI/SIR/STS/SVM Guidelines for the Diagnosis and Management of Patients With Thoracic Aortic Disease. Circulation. 2010; 121: B284-X324. Aortic aneurysm NOS (ICD10-I71.9) No dissection. The pulmonary arterial tree  is fairly well opacified. No filling defects to suggest pulmonary embolism. Mediastinum/Nodes: Scattered mediastinal and hilar lymph nodes are stable. An NG tube is coursing down the esophagus and into the stomach. Lungs/Pleura: Patchy ground-glass airspace disease in the lower lobes seen on the prior study is now dense airspace consolidation/pneumonia. Small associated pleural effusions. No pulmonary edema or worrisome pulmonary lesions. The endotracheal tube is in good position at the mid tracheal level. Upper Abdomen: No significant upper abdominal findings. Musculoskeletal: No significant bony findings. Review of the MIP images confirms the above findings. IMPRESSION: 1. No CT findings for pulmonary embolism. 2. Fusiform aneurysmal dilatation of the ascending thoracic aorta with maximum measurement of 5 cm. Recommend semi-annual imaging followup by CTA or MRA and referral to cardiothoracic surgery if not already obtained. 3. Patchy ground-glass airspace disease in the lower lobes (seen on the prior study) is now dense airspace consolidation/pneumonia. Small associated pleural effusions. 4. ET tube and NG tubes in good position. Electronically Signed   By: Marijo Sanes M.D.   On: 01/02/2022 15:55   VAS [place] LOWER EXTREMITY VENOUS (DVT)  Result Date: 01/02/2022  Lower Venous DVT Study Patient Name:  ROMOLO SIELING  Date of Exam:   01/02/2022 Medical Rec #: [id number]     Accession #:    [id number] Date of Birth: 1965-06-28    Patient Gender: M Patient Age:   37 years Exam Location[facility]  [facility] Procedure:      VAS [place] LOWER EXTREMITY VENOUS (DVT) Referring Phys: Ina Homes --------------------------------------------------------------------------------  Indications: Edema.  Comparison Study: no prior Performing Technologist: Archie Patten RVS  Examination Guidelines: A complete evaluation includes B-mode imaging, spectral Doppler, color Doppler, and power Doppler as needed of all accessible portions of  each vessel. Bilateral testing is considered an integral part of a complete examination. Limited examinations for reoccurring indications may be performed as noted. The reflux portion of the exam is performed with the patient in reverse Trendelenburg.  +---------+---------------+---------+-----------+----------+--------------+ RIGHT    CompressibilityPhasicitySpontaneityPropertiesThrombus Aging +---------+---------------+---------+-----------+----------+--------------+ CFV      Full           Yes      Yes                                 +---------+---------------+---------+-----------+----------+--------------+ SFJ      Full                                                        +---------+---------------+---------+-----------+----------+--------------+ FV Prox  Full                                                        +---------+---------------+---------+-----------+----------+--------------+ FV Mid   Full                                                        +---------+---------------+---------+-----------+----------+--------------+  FV DistalFull                                                        +---------+---------------+---------+-----------+----------+--------------+ PFV      Full                                                        +---------+---------------+---------+-----------+----------+--------------+ POP      Full           Yes      Yes                                 +---------+---------------+---------+-----------+----------+--------------+ PTV      Full                                                        +---------+---------------+---------+-----------+----------+--------------+ PERO     Full                                                        +---------+---------------+---------+-----------+----------+--------------+   +---------+---------------+---------+-----------+----------+--------------+ LEFT      CompressibilityPhasicitySpontaneityPropertiesThrombus Aging +---------+---------------+---------+-----------+----------+--------------+ CFV      Full           Yes      Yes                                 +---------+---------------+---------+-----------+----------+--------------+ SFJ      Full                                                        +---------+---------------+---------+-----------+----------+--------------+ FV Prox  Full                                                        +---------+---------------+---------+-----------+----------+--------------+ FV Mid   Full                                                        +---------+---------------+---------+-----------+----------+--------------+ FV DistalFull                                                        +---------+---------------+---------+-----------+----------+--------------+  PFV      Full                                                        +---------+---------------+---------+-----------+----------+--------------+ POP      Full           Yes      Yes                                 +---------+---------------+---------+-----------+----------+--------------+ PTV      Full                                                        +---------+---------------+---------+-----------+----------+--------------+ PERO     Full                                                        +---------+---------------+---------+-----------+----------+--------------+     Summary: BILATERAL: - No evidence of deep vein thrombosis seen in the lower extremities, bilaterally. -No evidence of popliteal cyst, bilaterally.   *See table(s) above for measurements and observations. Electronically signed by Harold Barban MD on 01/02/2022 at 11:13:04 AM.    Final    Overnight EEG with video  Result Date: 01/02/2022 Lora Havens, MD     01/03/2022  8:25 AM Patient Name: MARKIAN GLOCKNER MRN: [id number]  Epilepsy Attending: Lora Havens Referring Physician/Provider: Donnetta Simpers, MD Duration: 01/02/2022 6010 to 01/03/2022 9323 Patient history: 56yo M with ams. EEG to evaluate for seizure Level of alertness: comatose-->awake, asleep AEDs during EEG study: LEV, propofol Technical aspects: This EEG study was done with scalp electrodes positioned according to the 10-20 International system of electrode placement. Electrical activity was reviewed with band pass filter of 1-'70Hz'$ , sensitivity of 7 uV/mm, display speed of 64m/sec with a '60Hz'$  notched filter applied as appropriate. EEG data were recorded continuously and digitally stored.  Video monitoring was available and reviewed as appropriate. Description: At the beginning of study,  EEG showed continuous generalized 5 to 9 Hz theta-alpha activity admixed with intermittent generalized 2-'3hz'$  slowing. As sedation was weaned EEG showed posterior dominant rhythm of 8 Hz activity of moderate voltage (25-35 uV) seen predominantly in posterior head regions, symmetric and reactive to eye opening and eye closing. Sleep was characterized by sleep spindles (12-'14hz'$ ), maixmal fronto-central region/ Hyperventilation and photic stimulation were not performed.    ABNORMALITY - Continuous slow, generalized  IMPRESSION: This study is suggestive of moderate diffuse encephalopathy, nonspecific etiology. No seizures or epileptiform discharges were seen throughout the recording.  Priyanka OBarbra Sarks  CT TIBIA FIBULA LEFT WO CONTRAST  Result Date: 01/02/2022 CLINICAL DATA:  Lower leg trauma. EXAM: CT OF THE LOWER LEFT EXTREMITY WITHOUT CONTRAST TECHNIQUE: Multidetector CT imaging of the lower left extremity was performed according to the standard protocol. RADIATION DOSE REDUCTION: This exam was performed according to the departmental dose-optimization program which includes automated exposure control, adjustment of the mA and/or kV  according to patient size and/or use of  iterative reconstruction technique. COMPARISON:  07/23/2020. FINDINGS: Bones/Joint/Cartilage Total knee arthroplasty changes are present. No acute fracture, dislocation, or evidence of hardware loosening. Fixation hardware is noted in the distal fibula . There are severe degenerative changes of the tibiotalar joint, distal fibula and medial malleolus with erosions, not significantly changed from the prior exam. Degenerative changes are noted in the midfoot. There is a small joint effusion at the knee with synovial thickening. There is a large joint effusion containing debris at the tibiotalar joint Ligaments Suboptimally assessed by CT. Muscles and Tendons No intramuscular hematoma. Mild fatty atrophy of the calf musculature. Soft tissues Diffuse subcutaneous fat stranding and edema. IMPRESSION: 1. Status post total knee arthroplasty. No evidence of acute fracture or hardware loosening. 2. Extensive degenerative changes at the ankle and midfoot. 3. Small joint effusion with synovial thickening at the knee and large joint effusion containing debris at the ankle, not significantly changed from the prior exam. 4. Diffuse subcutaneous edema and fat stranding in the left lower extremity. Correlate clinically to exclude superimposed infection. Electronically Signed   By: Brett Fairy M.D.   On: 01/02/2022 02:50   CT ANGIO HEAD NECK W WO CM  Result Date: 01/02/2022 CLINICAL DATA:  Stroke suspected EXAM: CT ANGIOGRAPHY HEAD AND NECK TECHNIQUE: Multidetector CT imaging of the head and neck was performed using the standard protocol during bolus administration of intravenous contrast. Multiplanar CT image reconstructions and MIPs were obtained to evaluate the vascular anatomy. Carotid stenosis measurements (when applicable) are obtained utilizing NASCET criteria, using the distal internal carotid diameter as the denominator. RADIATION DOSE REDUCTION: This exam was performed according to the departmental dose-optimization  program which includes automated exposure control, adjustment of the mA and/or kV according to patient size and/or use of iterative reconstruction technique. CONTRAST:  55m OMNIPAQUE IOHEXOL 350 MG/ML SOLN COMPARISON:  No prior CTA, correlation is made with CT head 11/16/2020 FINDINGS: CT HEAD FINDINGS Brain: No evidence of acute infarct, hemorrhage, mass, mass effect, or midline shift. No hydrocephalus or extra-axial fluid collection. Vascular: No hyperdense vessel. Skull: Normal. Negative for fracture or focal lesion. Sinuses/Orbits: Bilateral maxillary mucous retention cyst. Mucosal thickening in the ethmoid air cells and sphenoid sinuses. The orbits are unremarkable. Other: The mastoid air cells are well aerated. CTA NECK FINDINGS Aortic arch: Two-vessel arch with a common origin of the brachiocephalic and left common carotid arteries. Imaged portion shows no evidence of aneurysm or dissection. No significant stenosis of the major arch vessel origins. Right carotid system: No evidence of stenosis, dissection, or occlusion. Left carotid system: No evidence of stenosis, dissection, or occlusion. Vertebral arteries: No evidence of stenosis, dissection, or occlusion. Skeleton: No acute osseous abnormality. Chronic C7 spinous process fracture. Poor dentition, with multifocal dental caries and periapical lucencies including lucency about a right maxillary molar that extends into the right maxillary sinus (series 15, image 81) and lucency about the roots of a left maxillary incisor, which extends to the hard palate (series 15, image 110). Other neck: The patient is intubated. Orogastric tube noted. Multiple small hypoenhancing lesions in the thyroid, the largest of which measures up to 9 mm, for which no follow-up is currently indicated. (Reference: J Am Coll Radiol. 2015 Feb;12(2): 143-50) Upper chest: No focal pulmonary opacity or pleural effusion. Review of the MIP images confirms the above findings CTA HEAD  FINDINGS Anterior circulation: Both internal carotid arteries are patent to the termini, without significant stenosis. A1 segments patent. Normal  anterior communicating artery. Anterior cerebral arteries are patent to their distal aspects. No M1 stenosis or occlusion. MCA branches perfused and symmetric. Posterior circulation: Vertebral arteries patent to the vertebrobasilar junction without stenosis. Posterior inferior cerebellar arteries patent proximally. Basilar patent to its distal aspect. Superior cerebellar arteries patent proximally. Patent P1 segments. PCAs perfused to their distal aspects without stenosis. The bilateral posterior communicating arteries are not visualized. Venous sinuses: As permitted by contrast timing, patent. Anatomic variants: None significant. Review of the MIP images confirms the above findings IMPRESSION: 1. No acute intracranial process. 2. No intracranial large vessel occlusion or significant stenosis. 3. No hemodynamically significant stenosis in the neck. 4. Poor dentition, with multifocal dental caries and periapical lucencies, with apparent odontogenic sinusitis in the right maxillary sinus, and periapical lucency involving the left medial hard palate. Electronically Signed   By: Merilyn Baba M.D.   On: 01/02/2022 02:04   DG Chest Portable 1 View  Result Date: 01/01/2022 CLINICAL DATA:  Central line placement. EXAM: PORTABLE CHEST 1 VIEW COMPARISON:  Earlier today radiograph and CT FINDINGS: Tip of the right internal jugular central venous catheter overlies the mid SVC. No pneumothorax. Endotracheal tube tip at the thoracic inlet. Enteric tube in place. Cardiomegaly is unchanged. Patchy bibasilar opacities cyst seen on earlier CT. IMPRESSION: 1. Tip of the right internal jugular central venous catheter overlies the mid SVC. No pneumothorax. 2. Endotracheal tube tip at the thoracic inlet. 3. Unchanged cardiomegaly. Electronically Signed   By: Keith Rake M.D.   On:  01/01/2022 22:37   DG Chest Port 1 View  Result Date: 01/01/2022 CLINICAL DATA:  Questionable sepsis - evaluate for abnormality. Unresponsive EXAM: PORTABLE CHEST 1 VIEW COMPARISON:  11/16/2020 FINDINGS: Endotracheal tube is 5 cm above the carina. NG tube enters the stomach. Heart borderline in size. Left perihilar and bilateral lower lobe opacities. No effusions or acute bony abnormality. IMPRESSION: Patchy bilateral perihilar and lower lobe airspace opacities. This could reflect edema or infection. Electronically Signed   By: Rolm Baptise M.D.   On: 01/01/2022 21:18   CT Angio Chest PE W and/or Wo Contrast  Result Date: 01/01/2022 CLINICAL DATA:  Found unresponsive, syncope. EXAM: CT ANGIOGRAPHY CHEST WITH CONTRAST TECHNIQUE: Multidetector CT imaging of the chest was performed using the standard protocol during bolus administration of intravenous contrast. Multiplanar CT image reconstructions and MIPs were obtained to evaluate the vascular anatomy. RADIATION DOSE REDUCTION: This exam was performed according to the departmental dose-optimization program which includes automated exposure control, adjustment of the mA and/or kV according to patient size and/or use of iterative reconstruction technique. CONTRAST:  149m OMNIPAQUE IOHEXOL 350 MG/ML SOLN COMPARISON:  11/16/2020. FINDINGS: Cardiovascular: Heart is normal in size and there is no pericardial effusion. Coronary artery calcifications are noted. There is calcification of the aortic annulus with aneurysmal dilatation of the ascending aorta measuring 4.8 cm. The pulmonary trunk is normal in caliber. There is a questionable segmental pulmonary artery filling defect in the right upper lobe, axial image 166. There is suboptimal opacification of the lobar, segmental, and subsegmental arteries due to timing of contrast bolus and mixing artifact. Findings for right heart strain are equivocal. Mediastinum/Nodes: Thyroid gland is enlarged with multiple  subcentimeter hypodense nodules. The trachea is within normal limits. The esophagus is fluid-filled. No mediastinal, hilar, or axillary lymphadenopathy. Lungs/Pleura: Patchy infiltrates are noted in the lungs bilaterally with a basilar and lower lobe predominance. No effusion or pneumothorax. Upper Abdomen: No acute abnormality. Musculoskeletal: Degenerative changes are present  in the thoracic spine. No acute osseous abnormality. Review of the MIP images confirms the above findings. IMPRESSION: 1. Suboptimal opacification of the pulmonary arteries. There is a questionable segmental pulmonary artery filling defect in the right upper lobe. Evaluation for right heart strain is equivocal. Repeat evaluation or V/Q scan is suggested for follow-up. 2. Patchy opacities in the lungs bilaterally with a basilar and lower lobe predominance, possible edema or infiltrate. 3. Atherosclerotic calcification of the aorta with aneurysmal dilatation of the ascending aorta measuring 4.8 cm. Ascending thoracic aortic aneurysm. Recommend semi-annual imaging followup by CTA or MRA and referral to cardiothoracic surgery if not already obtained. This recommendation follows 2010 ACCF/AHA/AATS/ACR/ASA/SCA/SCAI/SIR/STS/SVM Guidelines for the Diagnosis and Management of Patients With Thoracic Aortic Disease. Circulation. 2010; 121: R443-X540. Aortic aneurysm NOS (ICD10-I71.9) 4. Coronary artery calcifications. Electronically Signed   By: Brett Fairy M.D.   On: 01/01/2022 20:56     Subjective: No acute issues or events overnight, patient demanding to be discharged which we discussed was unsafe given his inability to ambulate without 2+ assistance likely secondary to his likely overdose and subsequent seizure as above   Discharge Exam: Vitals:   01/10/22 0405 01/10/22 0802  BP: (!) 154/78 129/80  Pulse: 87 94  Resp: 16 16  Temp: 98.6 F (37 C) 98 F (36.7 C)  SpO2: 97% 98%   Vitals:   01/09/22 2004 01/10/22 0027 01/10/22 0405  01/10/22 0802  BP: 121/64 (!) 147/83 (!) 154/78 129/80  Pulse: 88 83 87 94  Resp: (!) '22 20 16 16  '$ Temp: 99.1 F (37.3 C) 98.7 F (37.1 C) 98.6 F (37 C) 98 F (36.7 C)  TempSrc: Oral Oral  Oral  SpO2:  97% 97% 98%  Weight:      Height:        General: Pt is alert, awake, not in acute distress Cardiovascular: RRR, S1/S2 +, no rubs, no gallops Respiratory: CTA bilaterally, no wheezing, no rhonchi Abdominal: Soft, NT, ND, bowel sounds + Extremities: no edema, no cyanosis    The results of significant diagnostics from this hospitalization (including imaging, microbiology, ancillary and laboratory) are listed below for reference.     Microbiology: Recent Results (from the past 240 hour(s))  Blood Culture (routine x 2)     Status: None   Collection Time: 01/01/22  8:48 PM   Specimen: BLOOD  Result Value Ref Range Status   Specimen Description BLOOD BLOOD LEFT HAND  Final   Special Requests   Final    BOTTLES DRAWN AEROBIC AND ANAEROBIC Blood Culture adequate volume   Culture   Final    NO GROWTH 5 DAYS Performed at [facility], [address] [place], [place] [address]    Report Status 01/06/2022 FINAL  Final  Blood Culture (routine x 2)     Status: None   Collection Time: 01/01/22  8:49 PM   Specimen: BLOOD RIGHT HAND  Result Value Ref Range Status   Specimen Description BLOOD RIGHT HAND  Final   Special Requests   Final    BOTTLES DRAWN AEROBIC AND ANAEROBIC Blood Culture results may not be optimal due to an inadequate volume of blood received in culture bottles   Culture   Final    NO GROWTH 5 DAYS Performed at [facility], [address], [place], [place] [address]    Report Status 01/06/2022 FINAL  Final  Resp Panel by RT-PCR (Flu A&B, Covid) Anterior Nasal Swab     Status: None   Collection  Time: 01/01/22  8:54 PM   Specimen: Anterior Nasal Swab  Result Value Ref Range Status   SARS Coronavirus 2 by RT PCR NEGATIVE NEGATIVE Final    Comment:  (NOTE) SARS-CoV-2 target nucleic acids are NOT DETECTED.  The SARS-CoV-2 RNA is generally detectable in upper respiratory specimens during the acute phase of infection. The lowest concentration of SARS-CoV-2 viral copies this assay can detect is 138 copies/mL. A negative result does not preclude SARS-Cov-2 infection and should not be used as the sole basis for treatment or other patient management decisions. A negative result may occur with  improper specimen collection/handling, submission of specimen other than nasopharyngeal swab, presence of viral mutation(s) within the areas targeted by this assay, and inadequate number of viral copies(<138 copies/mL). A negative result must be combined with clinical observations, patient history, and epidemiological information. The expected result is Negative.  Fact Sheet for Patients:  EntrepreneurPulse.com.au  Fact Sheet for Healthcare Providers:  IncredibleEmployment.be  This test is no t yet approved or cleared by the [place] FDA and  has been authorized for detection and/or diagnosis of SARS-CoV-2 by FDA under an Emergency Use Authorization (EUA). This EUA will remain  in effect (meaning this test can be used) for the duration of the COVID-19 declaration under Section 564(b)(1) of the Act, 21 U.S.C.section 360bbb-3(b)(1), unless the authorization is terminated  or revoked sooner.       Influenza A by PCR NEGATIVE NEGATIVE Final   Influenza B by PCR NEGATIVE NEGATIVE Final    Comment: (NOTE) The Xpert Xpress SARS-CoV-2/FLU/RSV plus assay is intended as an aid in the diagnosis of influenza from Nasopharyngeal swab specimens and should not be used as a sole basis for treatment. Nasal washings and aspirates are unacceptable for Xpert Xpress SARS-CoV-2/FLU/RSV testing.  Fact Sheet for Patients: EntrepreneurPulse.com.au  Fact Sheet for Healthcare  Providers: IncredibleEmployment.be  This test is not yet approved or cleared by the [place] FDA and has been authorized for detection and/or diagnosis of SARS-CoV-2 by FDA under an Emergency Use Authorization (EUA). This EUA will remain in effect (meaning this test can be used) for the duration of the COVID-19 declaration under Section 564(b)(1) of the Act, 21 U.S.C. section 360bbb-3(b)(1), unless the authorization is terminated or revoked.  Performed at [facility] [address], [place], [place] [address]   Urine Culture     Status: None   Collection Time: 01/01/22  9:15 PM   Specimen: Urine, Clean Catch  Result Value Ref Range Status   Specimen Description   Final    URINE, CLEAN CATCH Performed at [facility], [address], [place], [place] [address]    Special Requests   Final    NONE Performed at [facility], [address], [place], [place] [address]    Culture   Final    NO GROWTH Performed at [facility], [place] [address] [place], [place] [address]    Report Status 01/03/2022 FINAL  Final  MRSA Next Gen by PCR, Nasal     Status: None   Collection Time: 01/01/22 11:42 PM   Specimen: Nasal Mucosa; Nasal Swab  Result Value Ref Range Status   MRSA by PCR Next Gen NOT DETECTED NOT DETECTED Final    Comment: (NOTE) The GeneXpert MRSA Assay (FDA approved for NASAL specimens only), is one component of a comprehensive MRSA colonization surveillance program. It is not intended to diagnose MRSA infection nor to guide or monitor treatment for MRSA infections. Test performance  is not FDA approved in patients less than 73 years old. Performed at [facility], [place] [address], [place], [place] [address]   Culture, Respiratory w Gram Stain     Status: None   Collection Time: 01/02/22 12:07 AM   Specimen: Tracheal Aspirate; Respiratory  Result Value Ref Range Status   Specimen Description TRACHEAL ASPIRATE  Final   Special Requests NONE   Final   Gram Stain   Final    MODERATE WBC PRESENT,BOTH PMN AND MONONUCLEAR MODERATE GRAM POSITIVE COCCI IN PAIRS AND CHAINS FEW GRAM NEGATIVE RODS    Culture   Final    ABUNDANT STAPHYLOCOCCUS AUREUS No Pseudomonas species isolated Performed at [facility], [place] [address], [place], [place] [address]    Report Status 01/05/2022 FINAL  Final   Organism ID, Bacteria STAPHYLOCOCCUS AUREUS  Final      Susceptibility   Staphylococcus aureus - MIC*    CIPROFLOXACIN <=0.5 SENSITIVE Sensitive     ERYTHROMYCIN <=0.25 SENSITIVE Sensitive     GENTAMICIN <=0.5 SENSITIVE Sensitive     OXACILLIN 0.5 SENSITIVE Sensitive     TETRACYCLINE <=1 SENSITIVE Sensitive     VANCOMYCIN 1 SENSITIVE Sensitive     TRIMETH/SULFA <=10 SENSITIVE Sensitive     CLINDAMYCIN <=0.25 SENSITIVE Sensitive     RIFAMPIN <=0.5 SENSITIVE Sensitive     Inducible Clindamycin NEGATIVE Sensitive     * ABUNDANT STAPHYLOCOCCUS AUREUS     Labs: BNP (last 3 results) No results for input(s): "BNP" in the last 8760 hours. Basic Metabolic Panel: Recent Labs  Lab 01/04/22 0408 01/05/22 0450 01/05/22 0625 01/05/22 2223 01/06/22 0329 01/08/22 0736 01/10/22 0358  NA 140 141 140 137 136 135 139  K 3.8 3.5 3.4* 3.7 3.4* 3.4* 3.5  CL 103 104  --   --  98 101 100  CO2 28 26  --   --  '23 23 28  '$ GLUCOSE 99 110*  --   --  106* 105* 115*  BUN 8 5*  --   --  '6 10 8  '$ CREATININE 0.65 0.54*  --   --  0.62 0.69 0.57*  CALCIUM 8.8* 9.1  --   --  8.9 8.7* 9.2  MG 2.1 1.7  --   --  1.9  --   --   PHOS 3.8 4.3  --   --  3.5  --   --    Liver Function Tests: Recent Labs  Lab 01/05/22 0609 01/06/22 0329 01/08/22 0736  AST '21 21 23  '$ ALT '16 18 24  '$ ALKPHOS 49 51 60  BILITOT 0.6 0.7 0.8  PROT 6.5 6.7 6.8  ALBUMIN 2.9* 3.2* 3.0*   No results for input(s): "LIPASE", "AMYLASE" in the last 168 hours. Recent Labs  Lab 01/05/22 0609 01/06/22 0329  AMMONIA 39* 27   CBC: Recent Labs  Lab 01/05/22 0450 01/05/22 0625  01/05/22 2223 01/06/22 0329 01/07/22 0341 01/08/22 0736 01/10/22 0358  WBC 6.1  --   --  8.3 9.6 9.1 8.9  HGB 11.7*   < > 12.2* 12.3* 13.4 13.6 12.3*  HCT 35.2*   < > 36.0* 37.6* 41.3 41.6 38.3*  MCV 83.8  --   --  82.8 83.1 82.7 83.6  PLT 228  --   --  288 333 336 365   < > = values in this interval not displayed.   Cardiac Enzymes: No results for input(s): "CKTOTAL", "CKMB", "CKMBINDEX", "TROPONINI" in the last 168 hours.  BNP: Invalid input(s): "POCBNP" CBG: Recent Labs  Lab 01/07/22 1945 01/07/22 2315 01/08/22 0724 01/08/22 1152 01/08/22 1504  GLUCAP 95 87 105* 110* 122*   D-Dimer No results for input(s): "DDIMER" in the last 72 hours. Hgb A1c No results for input(s): "HGBA1C" in the last 72 hours. Lipid Profile No results for input(s): "CHOL", "HDL", "LDLCALC", "TRIG", "CHOLHDL", "LDLDIRECT" in the last 72 hours. Thyroid function studies No results for input(s): "TSH", "T4TOTAL", "T3FREE", "THYROIDAB" in the last 72 hours.  Invalid input(s): "FREET3" Anemia work up No results for input(s): "VITAMINB12", "FOLATE", "FERRITIN", "TIBC", "IRON", "RETICCTPCT" in the last 72 hours. Urinalysis    Component Value Date/Time   COLORURINE YELLOW 01/01/2022 2115   APPEARANCEUR HAZY (A) 01/01/2022 2115   LABSPEC >1.046 (H) 01/01/2022 2115   PHURINE 5.0 01/01/2022 2115   GLUCOSEU NEGATIVE 01/01/2022 2115   HGBUR NEGATIVE 01/01/2022 2115   BILIRUBINUR NEGATIVE 01/01/2022 2115   KETONESUR NEGATIVE 01/01/2022 2115   PROTEINUR 30 (A) 01/01/2022 2115   UROBILINOGEN 1.0 05/25/2014 0822   NITRITE NEGATIVE 01/01/2022 2115   LEUKOCYTESUR NEGATIVE 01/01/2022 2115   Sepsis Labs Recent Labs  Lab 01/06/22 0329 01/07/22 0341 01/08/22 0736 01/10/22 0358  WBC 8.3 9.6 9.1 8.9   Microbiology Recent Results (from the past 240 hour(s))  Blood Culture (routine x 2)     Status: None   Collection Time: 01/01/22  8:48 PM   Specimen: BLOOD  Result Value Ref Range Status   Specimen  Description BLOOD BLOOD LEFT HAND  Final   Special Requests   Final    BOTTLES DRAWN AEROBIC AND ANAEROBIC Blood Culture adequate volume   Culture   Final    NO GROWTH 5 DAYS Performed at [facility], [address] [place], [place] [address]    Report Status 01/06/2022 FINAL  Final  Blood Culture (routine x 2)     Status: None   Collection Time: 01/01/22  8:49 PM   Specimen: BLOOD RIGHT HAND  Result Value Ref Range Status   Specimen Description BLOOD RIGHT HAND  Final   Special Requests   Final    BOTTLES DRAWN AEROBIC AND ANAEROBIC Blood Culture results may not be optimal due to an inadequate volume of blood received in culture bottles   Culture   Final    NO GROWTH 5 DAYS Performed at [facility], [address] [place], [place] [address]    Report Status 01/06/2022 FINAL  Final  Resp Panel by RT-PCR (Flu A&B, Covid) Anterior Nasal Swab     Status: None   Collection Time: 01/01/22  8:54 PM   Specimen: Anterior Nasal Swab  Result Value Ref Range Status   SARS Coronavirus 2 by RT PCR NEGATIVE NEGATIVE Final    Comment: (NOTE) SARS-CoV-2 target nucleic acids are NOT DETECTED.  The SARS-CoV-2 RNA is generally detectable in upper respiratory specimens during the acute phase of infection. The lowest concentration of SARS-CoV-2 viral copies this assay can detect is 138 copies/mL. A negative result does not preclude SARS-Cov-2 infection and should not be used as the sole basis for treatment or other patient management decisions. A negative result may occur with  improper specimen collection/handling, submission of specimen other than nasopharyngeal swab, presence of viral mutation(s) within the areas targeted by this assay, and inadequate number of viral copies(<138 copies/mL). A negative result must be combined with clinical observations, patient history, and epidemiological information. The expected result is Negative.  Fact Sheet for Patients:   EntrepreneurPulse.com.au  Fact Sheet  for Healthcare Providers:  IncredibleEmployment.be  This test is no t yet approved or cleared by the [place] and  has been authorized for detection and/or diagnosis of SARS-CoV-2 by FDA under an Emergency Use Authorization (EUA). This EUA will remain  in effect (meaning this test can be used) for the duration of the COVID-19 declaration under Section 564(b)(1) of the Act, 21 U.S.C.section 360bbb-3(b)(1), unless the authorization is terminated  or revoked sooner.       Influenza A by PCR NEGATIVE NEGATIVE Final   Influenza B by PCR NEGATIVE NEGATIVE Final    Comment: (NOTE) The Xpert Xpress SARS-CoV-2/FLU/RSV plus assay is intended as an aid in the diagnosis of influenza from Nasopharyngeal swab specimens and should not be used as a sole basis for treatment. Nasal washings and aspirates are unacceptable for Xpert Xpress SARS-CoV-2/FLU/RSV testing.  Fact Sheet for Patients: EntrepreneurPulse.com.au  Fact Sheet for Healthcare Providers: IncredibleEmployment.be  This test is not yet approved or cleared by the [place] FDA and has been authorized for detection and/or diagnosis of SARS-CoV-2 by FDA under an Emergency Use Authorization (EUA). This EUA will remain in effect (meaning this test can be used) for the duration of the COVID-19 declaration under Section 564(b)(1) of the Act, 21 U.S.C. section 360bbb-3(b)(1), unless the authorization is terminated or revoked.  Performed at [facility], [address], [place], [place] [address]   Urine Culture     Status: None   Collection Time: 01/01/22  9:15 PM   Specimen: Urine, Clean Catch  Result Value Ref Range Status   Specimen Description   Final    URINE, CLEAN CATCH Performed at [facility], [address], [place], [place] [address]    Special Requests   Final    NONE Performed at [facility], [address], [place], [place] [address]    Culture   Final    NO GROWTH Performed at [facility], [place] [address], [place], [place] [address]    Report Status 01/03/2022 FINAL  Final  MRSA Next Gen by PCR, Nasal     Status: None   Collection Time: 01/01/22 11:42 PM   Specimen: Nasal Mucosa; Nasal Swab  Result Value Ref Range Status   MRSA by PCR Next Gen NOT DETECTED NOT DETECTED Final    Comment: (NOTE) The GeneXpert MRSA Assay (FDA approved for NASAL specimens only), is one component of a comprehensive MRSA colonization surveillance program. It is not intended to diagnose MRSA infection nor to guide or monitor treatment for MRSA infections. Test performance is not FDA approved in patients less than 65 years old. Performed at [facility], [place] [address] [place], [place] [address]   Culture, Respiratory w Gram Stain     Status: None   Collection Time: 01/02/22 12:07 AM   Specimen: Tracheal Aspirate; Respiratory  Result Value Ref Range Status   Specimen Description TRACHEAL ASPIRATE  Final   Special Requests NONE  Final   Gram Stain   Final    MODERATE WBC PRESENT,BOTH PMN AND MONONUCLEAR MODERATE GRAM POSITIVE COCCI IN PAIRS AND CHAINS FEW GRAM NEGATIVE RODS    Culture   Final    ABUNDANT STAPHYLOCOCCUS AUREUS No Pseudomonas species isolated Performed at [facility], [place] [address], [place],  [address]    Report Status 01/05/2022 FINAL  Final   Organism ID, Bacteria STAPHYLOCOCCUS AUREUS  Final      Susceptibility   Staphylococcus aureus - MIC*  CIPROFLOXACIN <=0.5 SENSITIVE Sensitive     ERYTHROMYCIN <=0.25 SENSITIVE Sensitive     GENTAMICIN <=0.5 SENSITIVE Sensitive     OXACILLIN 0.5 SENSITIVE Sensitive     TETRACYCLINE <=1 SENSITIVE Sensitive     VANCOMYCIN 1 SENSITIVE Sensitive     TRIMETH/SULFA <=10 SENSITIVE Sensitive     CLINDAMYCIN <=0.25 SENSITIVE Sensitive     RIFAMPIN <=0.5 SENSITIVE Sensitive     Inducible Clindamycin  NEGATIVE Sensitive     * ABUNDANT STAPHYLOCOCCUS AUREUS     Time coordinating discharge: Over 30 minutes  SIGNED:   Little Ishikawa, DO [facility] 01/10/2022, 2:15 PM Pager   If 7PM-7AM, please contact night-coverage [email or web address]

## 2022-01-09 NOTE — Evaluation (Signed)
Physical Therapy Evaluation Patient Details Name: Andrew Haley MRN: [id number] DOB: [birth year] Today's Date: 01/09/2022  History of Present Illness  56 yo M presenting 11/22 to [facility] after being [place] un[place] horse [place]. ETT 11/22-11/24. Pt with encephalopathy s/p cocaine induced seizure, suspected element of withdrawal, acute respiratory failure due to AMS, and CAP vs aspiration PNA. MRI Brain negative. PMH includes: obesity, ETOH and polysubstance abuse, HTN, prostate CA, OSA  Clinical Impression   Pt admitted secondary to problem above with deficits below. PTA patient was modified independent using a cane vs RW due to bil ankle pain. He states he was living with girlfriend. Unclear how much assist she can provide.  Pt currently requires moderate assist for ambulation with RW with frequent LOB posteriorly and frequent standing rest breaks over 120 ft of walking. Anticipate good progress as he mentally clears, however if progress is slower than anticipated, would recommend SNF for continued therapies.  Anticipate patient will benefit from PT to address problems listed below.Will continue to follow acutely to maximize functional mobility independence and safety.          Recommendations for follow up therapy are one component of a multi-disciplinary discharge planning process, led by the attending physician.  Recommendations may be updated based on patient status, additional functional criteria and insurance authorization.  Follow Up Recommendations Home health PT (after additional time with acute therapies; if discharging sooner, would recommend SNF)      Assistance Recommended at Discharge Frequent or constant Supervision/Assistance  Patient can return home with the following  Two people to help with walking and/or transfers;Assistance with cooking/housework;Direct supervision/assist for medications management;Direct supervision/assist for financial management;Assist  for transportation;Help with stairs or ramp for entrance    Equipment Recommendations None recommended by PT  Recommendations for Other Services  OT consult    Functional Status Assessment Patient has had a recent decline in their functional status and demonstrates the ability to make significant improvements in function in a reasonable and predictable amount of time.     Precautions / Restrictions Precautions Precautions: Fall      Mobility  Bed Mobility Overal bed mobility: Needs Assistance Bed Mobility: Supine to Sit     Supine to sit: Min guard     General bed mobility comments: upon coming to sit EOB, pt reporting back pain and with leaning forward unsafely and required close-guarding    Transfers Overall transfer level: Needs assistance Equipment used: Rolling walker (2 wheels) Transfers: Sit to/from Stand Sit to Stand: Min assist           General transfer comment: bed elevated to simulate height at home; boosting assist to initiate motion    Ambulation/Gait Ambulation/Gait assistance: Mod assist Gait Distance (Feet): 120 Feet Assistive device: Rolling walker (2 wheels) Gait Pattern/deviations: Step-through pattern, Decreased stride length, Leaning posteriorly, Drifts right/left Gait velocity: decr Gait velocity interpretation: 1.31 - 2.62 ft/sec, indicative of limited community ambulator   General Gait Details: pt with multiple standing breaks to stretch his back (arches and leans shoulders backwards with PT supporting to prevent LOB posteriorly--occurred twice despite assistance); poor ability to manuever RW with RUE due to painful wrist and hand  Stairs            Wheelchair Mobility    Modified Rankin (Stroke Patients Only)       Balance Overall balance assessment: Needs assistance Sitting-balance support: No upper extremity supported, Feet supported Sitting balance-[name]: Fair  Standing balance support: Bilateral upper  extremity supported Standing balance-[name]: Poor Standing balance comment: posterior bias                             Pertinent Vitals/Pain Pain Assessment Pain Assessment: Faces Faces Pain Scale: Hurts whole lot Pain Location: rt hand Pain Descriptors / Indicators: Discomfort, Grimacing, Guarding Pain Intervention(s): Limited activity within patient's tolerance, Premedicated before session    Home Living Family/patient expects to be discharged to:: Private residence Living Arrangements: Spouse/significant other (girlfriend) Available Help at Discharge: Friend(s);Available PRN/intermittently Type of Home: Apartment Home Access: Stairs to enter Entrance Stairs-Rails: Right Entrance Stairs-Number of Steps: 2   Home Layout: One level Home Equipment: Counsellor (2 wheels);Cane - single point;Wheelchair - manual      Prior Function Prior Level of Function : Independent/Modified Independent             Mobility Comments: reports uses cane or RW at all times due to bil ankle pain and sciatica ADLs Comments: reports independent, including driving; works selling hay     Hand Dominance   Dominant Hand: Right    Extremity/Trunk Assessment   Upper Extremity Assessment Upper Extremity Assessment: RUE deficits/detail (+edema right wrist and hand; difficult to assess if weak due to pain)    Lower Extremity Assessment Lower Extremity Assessment: RLE deficits/detail RLE Deficits / Details: grossly 4/5 (pt not following commands for MMT)    Cervical / Trunk Assessment Cervical / Trunk Assessment: Normal  Communication   Communication: Expressive difficulties (slurred speech with drooling)  Cognition Arousal/Alertness: Awake/alert Behavior During Therapy: Impulsive Overall Cognitive Status: No family/caregiver present to determine baseline cognitive functioning                                 General Comments: oriented to self,  location, not time or situation; following simple commands, but moving impulsively with RW        General Comments General comments (skin integrity, edema, etc.): Pt in soaking wet bed on arrival with tube to primofit disconnected    Exercises     Assessment/Plan    PT Assessment Patient needs continued PT services  PT Problem List Decreased strength;Decreased activity tolerance;Decreased balance;Decreased mobility;Decreased cognition;Decreased knowledge of use of DME;Decreased knowledge of precautions;Decreased safety awareness;Obesity;Pain       PT Treatment Interventions DME instruction;Gait training;Stair training;Functional mobility training;Therapeutic activities;Therapeutic exercise;Balance training;Neuromuscular re-education;Cognitive remediation;Patient/family education    PT Goals (Current goals can be found in the Care Plan section)  Acute Rehab PT Goals Patient Stated Goal: go home today PT Goal Formulation: With patient Time For Goal Achievement: 01/23/22 Potential to Achieve Goals: Fair    Frequency Min 3X/week     Co-evaluation               AM-PAC PT "6 Clicks" Mobility  Outcome Measure Help needed turning from your back to your side while in a flat bed without using bedrails?: None Help needed moving from lying on your back to sitting on the side of a flat bed without using bedrails?: A Little Help needed moving to and from a bed to a chair (including a wheelchair)?: A Lot Help needed standing up from a chair using your arms (e.g., wheelchair or bedside chair)?: A Lot Help needed to walk in hospital room?: A Lot Help needed climbing 3-5 steps with a railing? : Total 6 Click  Score: 14    End of Session Equipment Utilized During Treatment: Gait belt Activity Tolerance: Patient limited by pain Patient left: in chair;with call bell/phone within reach;with chair alarm set;with family/visitor present Nurse Communication: Mobility status PT Visit  Diagnosis: Unsteadiness on feet (R26.81);Other abnormalities of gait and mobility (R26.89)    Time: 6440-3474 PT Time Calculation (min) (ACUTE ONLY): 29 min   Charges:   PT Evaluation $PT Eval Low Complexity: 1 Low PT Treatments $Gait Training: 8-22 mins         [place]  Office [phone]   Rexanne Mano 01/09/2022, 11:50 AM

## 2022-01-10 DIAGNOSIS — G40901 Epilepsy, unspecified, not intractable, with status epilepticus: Secondary | ICD-10-CM | POA: Diagnosis not present

## 2022-01-10 LAB — BASIC METABOLIC PANEL
Anion gap: 11 (ref 5–15)
BUN: 8 mg/dL (ref 6–20)
CO2: 28 mmol/L (ref 22–32)
Calcium: 9.2 mg/dL (ref 8.9–10.3)
Chloride: 100 mmol/L (ref 98–111)
Creatinine, Ser: 0.57 mg/dL — ABNORMAL LOW (ref 0.61–1.24)
GFR, Estimated: >60 mL/min (ref >60)
Glucose, Bld: 115 mg/dL — ABNORMAL HIGH (ref 70–99)
Potassium: 3.5 mmol/L (ref 3.5–5.1)
Sodium: 139 mmol/L (ref 135–145)

## 2022-01-10 LAB — CBC
HCT: 38.3 % — ABNORMAL LOW (ref 39.0–52.0)
Hemoglobin: 12.3 g/dL — ABNORMAL LOW (ref 13.0–17.0)
MCH: 26.9 pg (ref 26.0–34.0)
MCHC: 32.1 g/dL (ref 30.0–36.0)
MCV: 83.6 fL (ref 80.0–100.0)
Platelets: 365 10*3/uL (ref 150–400)
RBC: 4.58 MIL/uL (ref 4.22–5.81)
RDW: 12.7 % (ref 11.5–15.5)
WBC: 8.9 10*3/uL (ref 4.0–10.5)
nRBC: 0 % (ref 0.0–0.2)

## 2022-01-10 NOTE — Progress Notes (Signed)
Pt signed himself out AMA>  Pt has been advised by MD as well as Physical Therapy and bedside nurse.  Pt unable to ambulate himself.  Friend or relative brought wheelchair and assisted pt to exit the hospital.

## 2022-01-10 NOTE — Progress Notes (Signed)
Speech Language Pathology Treatment: Dysphagia  Patient Details Name: Andrew Haley MRN: [id number] DOB: 1965/12/09 Today's Date: 01/10/2022 Time: 3154-0086 SLP Time Calculation (min) (ACUTE ONLY): 17 min  Assessment / Plan / Recommendation Clinical Impression  Pt was sitting up in his chair with two cans of coca cola on his table, both mostly empty. When asked about use of thickener, pt said he didn't know what I was talking about, and when shown a cup of pre-thickened water, he replied that he didn't like it and wanted thin liquids instead. Pt needed tactile and verbal cues initially to consume thin water one sip at a time per recommendations from MBS, although cueing faded as session continued. Only one delayed cough was noted. Pt also consumed regular solids, purees, and nectar thick liquids with a more impulsive rate of intake particularly with solids as he did not like the thickened liquids. Given impulsivity, would maintain nectar thick liquids during meals, but could allow small amounts of thin water in between meals when full staff supervision can be provided to cue for single sips.   HPI HPI: Pt is a 56 yo M presenting 11/22 to [facility] after being [place] un[place] horse [place]. ETT 11/22-11/24. Pt with encephalopathy s/p cocaine induced seizure, suspected element of withdrawal, acute respiratory failure due to AMS, and CAP vs aspiration PNA. MRI Brain negative. PMH includes: obesity, ETOH and polysubstance abuse, HTN, prostate CA, OSA      SLP Plan  Continue with current plan of care      Recommendations for follow up therapy are one component of a multi-disciplinary discharge planning process, led by the attending physician.  Recommendations may be updated based on patient status, additional functional criteria and insurance authorization.    Recommendations  Diet recommendations: Regular;Nectar-thick liquid (can have sips of water in between meals when full staff  supervision can be provided to take small, single sips) Liquids provided via: Cup;Straw Medication Administration: Whole meds with puree Supervision: Patient able to self feed;Full supervision/cueing for compensatory strategies;Staff to assist with self feeding Compensations: Slow rate;Small sips/bites Postural Changes and/or Swallow Maneuvers: Seated upright 90 degrees                Oral Care Recommendations: Oral care BID Follow Up Recommendations: Outpatient SLP Assistance recommended at discharge: None SLP Visit Diagnosis: Dysphagia, oropharyngeal phase (R13.12) Plan: Continue with current plan of care           Osie Bond., M.A. [place] Office [phone]  Secure chat preferred   01/10/2022, 12:41 PM

## 2022-01-10 NOTE — Progress Notes (Signed)
Physical Therapy Treatment Patient Details Name: Andrew Haley MRN: [id number] DOB: 11-Oct-1965 Today's Date: 01/10/2022   History of Present Illness 56 yo M presenting 11/22 to [facility] after being [place]. ETT 11/22-11/24. Pt with encephalopathy s/p cocaine induced seizure, suspected element of withdrawal, acute respiratory failure due to AMS, and CAP vs aspiration PNA. MRI Brain negative. PMH includes: obesity, ETOH and polysubstance abuse, HTN, prostate CA, OSA    PT Comments    Pt received in recliner, tearful stating 'I need to get out of here.' He required +2 mod assist sit to stand, and min assist +2 safety amb 220' with RW. Unsteady gait with poor standing balance. Pt with poor awareness of medical status. Upon returning to room, pt stating he wants to leave AMA. RN notified.    Recommendations for follow up therapy are one component of a multi-disciplinary discharge planning process, led by the attending physician.  Recommendations may be updated based on patient status, additional functional criteria and insurance authorization.  Follow Up Recommendations  Home health PT (after additional time with acute therapies; if discharging sooner, would recommend SNF)     Assistance Recommended at Discharge Frequent or constant Supervision/Assistance  Patient can return home with the following Two people to help with walking and/or transfers;Assistance with cooking/housework;Direct supervision/assist for medications management;Direct supervision/assist for financial management;Assist for transportation;Help with stairs or ramp for entrance   Equipment Recommendations  None recommended by PT    Recommendations for Other Services       Precautions / Restrictions Precautions Precautions: Fall     Mobility  Bed Mobility               General bed mobility comments: Pt received in recliner.    Transfers Overall transfer level: Needs  assistance Equipment used: Rolling walker (2 wheels) Transfers: Sit to/from Stand Sit to Stand: Mod assist, +2 physical assistance           General transfer comment: cues for sequencing, increased time to power up and stabilize initial standing balance. Retropulsive    Ambulation/Gait Ambulation/Gait assistance: +2 safety/equipment, Min assist Gait Distance (Feet): 220 Feet Assistive device: Rolling walker (2 wheels) Gait Pattern/deviations: Step-through pattern, Decreased stride length, Leaning posteriorly, Drifts right/left, Narrow base of support Gait velocity: decreased Gait velocity interpretation: <1.31 ft/sec, indicative of household ambulator   General Gait Details: Unsteady gait. Cues for proximity to RW and to widen BOS. Several standing rest breaks.   Stairs             Wheelchair Mobility    Modified Rankin (Stroke Patients Only)       Balance Overall balance assessment: Needs assistance Sitting-balance support: No upper extremity supported, Feet supported Sitting balance-[name]: Fair     Standing balance support: Bilateral upper extremity supported, During functional activity, Reliant on assistive device for balance Standing balance-[name]: Poor                              Cognition Arousal/Alertness: Awake/alert Behavior During Therapy: Impulsive, Agitated (mildly agitated) Overall Cognitive Status: No family/caregiver present to determine baseline cognitive functioning                                 General Comments: Poor medical awareness. Labile. Continues to state he plans to leave AMA.  Exercises      General Comments        Pertinent Vitals/Pain Pain Assessment Pain Assessment: Faces Faces Pain Scale: Hurts even more Pain Location: back Pain Descriptors / Indicators: Discomfort, Grimacing, Guarding Pain Intervention(s): Monitored during session, Repositioned    Home Living                           Prior Function            PT Goals (current goals can now be found in the care plan section) Acute Rehab PT Goals Patient Stated Goal: home Progress towards PT goals: Progressing toward goals    Frequency    Min 3X/week      PT Plan Current plan remains appropriate    Co-evaluation              AM-PAC PT "6 Clicks" Mobility   Outcome Measure  Help needed turning from your back to your side while in a flat bed without using bedrails?: None Help needed moving from lying on your back to sitting on the side of a flat bed without using bedrails?: A Little Help needed moving to and from a bed to a chair (including a wheelchair)?: A Lot Help needed standing up from a chair using your arms (e.g., wheelchair or bedside chair)?: A Lot Help needed to walk in hospital room?: A Lot Help needed climbing 3-5 steps with a railing? : Total 6 Click Score: 14    End of Session Equipment Utilized During Treatment: Gait belt Activity Tolerance: Patient tolerated treatment well Patient left: in chair;with call bell/phone within reach;with chair alarm set Nurse Communication: Mobility status;Other (comment) (Pt wants to leave AMA.) PT Visit Diagnosis: Unsteadiness on feet (R26.81);Other abnormalities of gait and mobility (R26.89)     Time: 4270-6237 PT Time Calculation (min) (ACUTE ONLY): 25 min  Charges:  $Gait Training: 23-37 mins                     Andrew Haley, [place]  Office # [phone] Pager [phone]    Andrew Haley 01/10/2022, 12:01 PM

## 2022-01-14 DIAGNOSIS — G894 Chronic pain syndrome: Secondary | ICD-10-CM | POA: Diagnosis not present

## 2022-01-15 DIAGNOSIS — C61 Malignant neoplasm of prostate: Secondary | ICD-10-CM | POA: Diagnosis not present

## 2022-01-15 DIAGNOSIS — N5201 Erectile dysfunction due to arterial insufficiency: Secondary | ICD-10-CM | POA: Diagnosis not present

## 2022-01-15 DIAGNOSIS — N393 Stress incontinence (female) (male): Secondary | ICD-10-CM | POA: Diagnosis not present

## 2022-01-21 DIAGNOSIS — I712 Thoracic aortic aneurysm, without rupture, unspecified: Secondary | ICD-10-CM | POA: Diagnosis not present

## 2022-01-21 DIAGNOSIS — G894 Chronic pain syndrome: Secondary | ICD-10-CM | POA: Diagnosis not present

## 2022-01-21 DIAGNOSIS — M170 Bilateral primary osteoarthritis of knee: Secondary | ICD-10-CM | POA: Diagnosis not present

## 2022-01-21 DIAGNOSIS — Z6835 Body mass index (BMI) 35.0-35.9, adult: Secondary | ICD-10-CM | POA: Diagnosis not present

## 2022-01-21 DIAGNOSIS — M1991 Primary osteoarthritis, unspecified site: Secondary | ICD-10-CM | POA: Diagnosis not present

## 2022-01-21 DIAGNOSIS — I10 Essential (primary) hypertension: Secondary | ICD-10-CM | POA: Diagnosis not present

## 2022-01-21 DIAGNOSIS — E6609 Other obesity due to excess calories: Secondary | ICD-10-CM | POA: Diagnosis not present

## 2022-01-31 DIAGNOSIS — M9901 Segmental and somatic dysfunction of cervical region: Secondary | ICD-10-CM | POA: Diagnosis not present

## 2022-01-31 DIAGNOSIS — M5412 Radiculopathy, cervical region: Secondary | ICD-10-CM | POA: Diagnosis not present

## 2022-01-31 DIAGNOSIS — M9902 Segmental and somatic dysfunction of thoracic region: Secondary | ICD-10-CM | POA: Diagnosis not present

## 2022-01-31 DIAGNOSIS — M9907 Segmental and somatic dysfunction of upper extremity: Secondary | ICD-10-CM | POA: Diagnosis not present

## 2022-01-31 DIAGNOSIS — M7711 Lateral epicondylitis, right elbow: Secondary | ICD-10-CM | POA: Diagnosis not present

## 2022-01-31 DIAGNOSIS — M546 Pain in thoracic spine: Secondary | ICD-10-CM | POA: Diagnosis not present

## 2022-02-07 DIAGNOSIS — M546 Pain in thoracic spine: Secondary | ICD-10-CM | POA: Diagnosis not present

## 2022-02-07 DIAGNOSIS — M9901 Segmental and somatic dysfunction of cervical region: Secondary | ICD-10-CM | POA: Diagnosis not present

## 2022-02-07 DIAGNOSIS — M7711 Lateral epicondylitis, right elbow: Secondary | ICD-10-CM | POA: Diagnosis not present

## 2022-02-07 DIAGNOSIS — M9907 Segmental and somatic dysfunction of upper extremity: Secondary | ICD-10-CM | POA: Diagnosis not present

## 2022-02-07 DIAGNOSIS — M5412 Radiculopathy, cervical region: Secondary | ICD-10-CM | POA: Diagnosis not present

## 2022-02-07 DIAGNOSIS — M9902 Segmental and somatic dysfunction of thoracic region: Secondary | ICD-10-CM | POA: Diagnosis not present

## 2022-02-17 DIAGNOSIS — Z0001 Encounter for general adult medical examination with abnormal findings: Secondary | ICD-10-CM | POA: Diagnosis not present

## 2022-02-17 DIAGNOSIS — C61 Malignant neoplasm of prostate: Secondary | ICD-10-CM | POA: Diagnosis not present

## 2022-02-17 DIAGNOSIS — G894 Chronic pain syndrome: Secondary | ICD-10-CM | POA: Diagnosis not present

## 2022-02-17 DIAGNOSIS — M67441 Ganglion, right hand: Secondary | ICD-10-CM | POA: Diagnosis not present

## 2022-02-17 DIAGNOSIS — I712 Thoracic aortic aneurysm, without rupture, unspecified: Secondary | ICD-10-CM | POA: Diagnosis not present

## 2022-02-17 DIAGNOSIS — M1991 Primary osteoarthritis, unspecified site: Secondary | ICD-10-CM | POA: Diagnosis not present

## 2022-02-17 DIAGNOSIS — Z125 Encounter for screening for malignant neoplasm of prostate: Secondary | ICD-10-CM | POA: Diagnosis not present

## 2022-02-17 DIAGNOSIS — E039 Hypothyroidism, unspecified: Secondary | ICD-10-CM | POA: Diagnosis not present

## 2022-02-17 DIAGNOSIS — D518 Other vitamin B12 deficiency anemias: Secondary | ICD-10-CM | POA: Diagnosis not present

## 2022-02-17 DIAGNOSIS — G5601 Carpal tunnel syndrome, right upper limb: Secondary | ICD-10-CM | POA: Diagnosis not present

## 2022-02-17 DIAGNOSIS — I10 Essential (primary) hypertension: Secondary | ICD-10-CM | POA: Diagnosis not present

## 2022-02-17 DIAGNOSIS — Z0000 Encounter for general adult medical examination without abnormal findings: Secondary | ICD-10-CM | POA: Diagnosis not present

## 2022-02-17 DIAGNOSIS — E559 Vitamin D deficiency, unspecified: Secondary | ICD-10-CM | POA: Diagnosis not present

## 2022-02-19 ENCOUNTER — Encounter: Payer: Medicare HMO | Admitting: Surgery

## 2022-03-05 ENCOUNTER — Encounter: Payer: Self-pay | Admitting: Surgery

## 2022-03-05 ENCOUNTER — Institutional Professional Consult (permissible substitution): Payer: Medicare HMO | Admitting: Surgery

## 2022-03-05 VITALS — BP 160/90 | HR 90 | Resp 20 | Ht 75.0 in | Wt 250.0 lb

## 2022-03-05 DIAGNOSIS — I7121 Aneurysm of the ascending aorta, without rupture: Secondary | ICD-10-CM

## 2022-03-05 NOTE — Progress Notes (Unsigned)
Cardiothoracic Surgery Consultation  PCP is Redmond School, MD Referring Provider is Redmond School, MD  Chief Complaint  Patient presents with   Thoracic Aortic Aneurysm    Surgical consult, Chest CT 01/02/22/ ECHO 01/03/22    HPI:  The patient is a 57 year old gentleman with a history of hypertension, hyperlipidemia, sleep apnea on CPAP, prostate cancer, EtOH and polysubstance abuse who was taken to the [facility] emergency department on 01/01/2022 after being [place].  He was reportedly tachycardic with decreased respirations and was given Narcan with transient improved mental status.  He reportedly had a seizure in the emergency department and was hypercarbic and was intubated.  He had a CT scan of the chest which showed no evidence of pulmonary embolism but did show a 5 cm ascending aortic aneurysm.  MRI of the brain was unremarkable and EEG was negative for seizure.  He did have a positive drug screen for cocaine and was seen by neurology who felt that he likely had a cocaine induced seizure.  He recovered and signed out AMA on 01/10/2022.  He denies any family history of aortic aneurysm, connective tissue disorder, or or aortic dissection.  He said that he drinks about 12 beers a week but denies any other drug use or smoking. Past Medical History:  Diagnosis Date   Arthritis    Class 1 obesity 78/93/8101   Complication of anesthesia    slow to wake up   Essential hypertension 07/18/2020   Headache    Hypertension    Hypertriglyceridemia    Numbness of fingers of both hands    Prostate cancer ([place]) 2017   Sleep apnea    CPAP    Past Surgical History:  Procedure Laterality Date   ANKLE SURGERY Left    COLONOSCOPY WITH PROPOFOL N/A 09/28/2017   Procedure: COLONOSCOPY WITH PROPOFOL;  Surgeon: Daneil Dolin, MD;  Location: [facility];  Service: Endoscopy;  Laterality: N/A;  12:00pm   HAND SURGERY Right 2003   cysts    JOINT REPLACEMENT      LESION REMOVAL Left 09/23/2013   Procedure: MINOR EXCISION 3 CM SKIN LESION OF LEFT THIGH ;  Surgeon: Jamesetta So, MD;  Location: [facility]S;  Service: General;  Laterality: Left;   LYMPHADENECTOMY Bilateral 11/29/2015   Procedure: LYMPHADENECTOMY;  Surgeon: Raynelle Bring, MD;  Location: [facility];  Service: Urology;  Laterality: Bilateral;   ROBOT ASSISTED LAPAROSCOPIC RADICAL PROSTATECTOMY N/A 11/29/2015   Procedure: XI ROBOTIC ASSISTED LAPAROSCOPIC RADICAL PROSTATECTOMY LEVEL 3;  Surgeon: Raynelle Bring, MD;  Location: [facility];  Service: Urology;  Laterality: N/A;   TOTAL KNEE ARTHROPLASTY Left 02/22/2014   Procedure: LEFT TOTAL KNEE ARTHROPLASTY;  Surgeon: Tobi Bastos, MD;  Location: [facility];  Service: Orthopedics;  Laterality: Left;   TOTAL KNEE ARTHROPLASTY Right 05/23/2014   Procedure: RIGHT TOTAL KNEE ARTHROPLASTY;  Surgeon: Latanya Maudlin, MD;  Location: [facility];  Service: Orthopedics;  Laterality: Right;   TOTAL KNEE REVISION Right 03/04/2021   Procedure: TOTAL KNEE REVISION;  Surgeon: Paralee Cancel, MD;  Location: [facility];  Service: Orthopedics;  Laterality: Right;    Family History  Problem Relation Age of Onset   Heart disease Mother    Throat cancer Father    Colon cancer Neg Hx    Colon polyps Neg Hx     Social History Social History   Tobacco Use   Smoking status: Never   Smokeless tobacco: Never  Vaping Use  Vaping Use: Never used  Substance Use Topics   Alcohol use: Yes    Comment: 5-6 beers daily   Drug use: Not Currently    Types: Cocaine    Comment: 1 week ago    Current Outpatient Medications  Medication Sig Dispense Refill   amLODipine (NORVASC) 10 MG tablet Take 10 mg by mouth daily.     diclofenac (VOLTAREN) 75 MG EC tablet Take 75 mg by mouth 2 (two) times daily.     furosemide (LASIX) 40 MG tablet Take 40 mg by mouth 2 (two) times daily.     losartan (COZAAR) 100 MG tablet Take 100 mg by mouth daily.     Oxycodone HCl 10 MG TABS Take 10 mg by  mouth every 4 (four) hours as needed (pain).     traZODone (DESYREL) 100 MG tablet Take 100 mg by mouth at bedtime as needed for sleep.     losartan-hydrochlorothiazide (HYZAAR) 50-12.5 MG tablet Take 1 tablet by mouth daily. 30 tablet 11   naloxone (NARCAN) nasal spray 4 mg/0.1 mL Spray into nostrils if patient shows signs of opioid overdose, then call 911 (Patient not taking: Reported on 03/05/2022) 1 each 0   No current facility-administered medications for this visit.    No Known Allergies  Review of Systems  Constitutional:  Positive for activity change. Negative for fatigue.  HENT: Negative.    Eyes: Negative.   Respiratory: Negative.    Cardiovascular:  Positive for leg swelling.  Gastrointestinal: Negative.   Endocrine: Negative.   Genitourinary: Negative.   Musculoskeletal:  Positive for arthralgias.       Says he needs surgery on his ankle.  Skin: Negative.   Allergic/Immunologic: Negative.   Neurological:  Negative for dizziness and syncope.  Hematological: Negative.   Psychiatric/Behavioral: Negative.      BP (!) 160/90   Pulse 90   Resp 20   Ht '6\' 3"'$  (1.905 m)   Wt 250 lb (113.4 kg)   SpO2 96% Comment: RA  BMI 31.25 kg/m  Physical Exam Constitutional:      Appearance: Normal appearance. He is normal weight.  HENT:     Head: Normocephalic and atraumatic.  Eyes:     Extraocular Movements: Extraocular movements intact.     Pupils: Pupils are equal, round, and reactive to light.  Neck:     Vascular: No carotid bruit.  Cardiovascular:     Rate and Rhythm: Normal rate and regular rhythm.     Pulses: Normal pulses.     Heart sounds: Normal heart sounds. No murmur heard. Pulmonary:     Effort: Pulmonary effort is normal.     Breath sounds: Normal breath sounds.  Abdominal:     General: There is no distension.     Tenderness: There is no abdominal tenderness.  Musculoskeletal:        General: Swelling present.     Comments: Deformity left ankle with  bilateral lower extremity edema worse on the left than the right  Skin:    General: Skin is warm and dry.  Neurological:     General: No focal deficit present.     Mental Status: He is alert and oriented to person, place, and time.  Psychiatric:        Mood and Affect: Mood normal.        Behavior: Behavior normal.      Diagnostic Tests:  ECHOCARDIOGRAM REPORT       Patient Name:   MELQUISEDEC JOURNEY  Date of Exam: 01/03/2022  Medical Rec #:  [id number]    Height:       74.0 in  Accession #:    [id number]   Weight:       270.1 lb  Date of Birth:  Nov 27, 1965   BSA:          2.469 m  Patient Age:    48 years     BP:           124/72 mmHg  Patient Gender: M            HR:           72 bpm.  Exam Location:  Inpatient   Procedure: 2D Echo and Intracardiac Opacification Agent   Indications:    pulmonary embolus    History:        Patient has no prior history of Echocardiogram  examinations.    Sonographer:   Harvie Junior  Referring Phys: [id number] Candee Furbish     Sonographer Comments: Technically difficult study due to poor echo windows  and patient is obese. Image acquisition challenging due to patient body  habitus.  IMPRESSIONS     1. Left ventricular ejection fraction, by estimation, is 55 to 60%. The  left ventricle has normal function. The left ventricle has no regional  wall motion abnormalities. There is mild concentric left ventricular  hypertrophy. Left ventricular diastolic  parameters are indeterminate.   2. Right ventricular systolic function is normal. The right ventricular  size is normal. Tricuspid regurgitation signal is inadequate for assessing  PA pressure.   3. The mitral valve is normal in structure. No evidence of mitral valve  regurgitation. No evidence of mitral stenosis.   4. The aortic valve was not well visualized. Aortic valve regurgitation  is not visualized. Aortic valve mean gradient measures 8.0 mmHg.   5. The inferior vena cava is normal in  size with greater than 50%  respiratory variability, suggesting right atrial pressure of 3 mmHg.   Comparison(s): No prior Echocardiogram.   FINDINGS   Left Ventricle: Left ventricular ejection fraction, by estimation, is 55  to 60%. The left ventricle has normal function. The left ventricle has no  regional wall motion abnormalities. Definity contrast agent was given IV  to delineate the left ventricular   endocardial borders. The left ventricular internal cavity size was normal  in size. There is mild concentric left ventricular hypertrophy. Left  ventricular diastolic parameters are indeterminate.   Right Ventricle: The right ventricular size is normal. No increase in  right ventricular wall thickness. Right ventricular systolic function is  normal. Tricuspid regurgitation signal is inadequate for assessing PA  pressure.   Left Atrium: Left atrial size was normal in size.   Right Atrium: Right atrial size was normal in size.   Pericardium: There is no evidence of pericardial effusion.   Mitral Valve: The mitral valve is normal in structure. No evidence of  mitral valve regurgitation. No evidence of mitral valve stenosis.   Tricuspid Valve: The tricuspid valve is not well visualized. Tricuspid  valve regurgitation is not demonstrated. No evidence of tricuspid  stenosis.   Aortic Valve: The aortic valve was not well visualized. Aortic valve  regurgitation is not visualized. Aortic valve mean gradient measures 8.0  mmHg. Aortic valve peak gradient measures 15.7 mmHg. Aortic valve area, by  VTI measures 2.14 cm.   Pulmonic Valve: The pulmonic valve was not well visualized. Pulmonic valve  regurgitation is not  visualized. No evidence of pulmonic stenosis.   Aorta: The aortic root and ascending aorta are structurally normal, with  no evidence of dilitation and the aortic root is normal in size and  structure.   Venous: The inferior vena cava is normal in size with greater  than 50%  respiratory variability, suggesting right atrial pressure of 3 mmHg.   IAS/Shunts: No atrial level shunt detected by color flow Doppler.     LEFT VENTRICLE  PLAX 2D  LVIDd:         5.30 cm      Diastology  LVIDs:         3.30 cm      LV e' medial:    6.53 cm/s  LV PW:         1.20 cm      LV E/e' medial:  18.4  LV IVS:        1.20 cm      LV e' lateral:   10.90 cm/s  LVOT diam:     2.10 cm      LV E/e' lateral: 11.0  LV SV:         84  LV SV Index:   34  LVOT Area:     3.46 cm    LV Volumes (MOD)  LV vol d, MOD A2C: 93.1 ml  LV vol d, MOD A4C: 145.0 ml  LV vol s, MOD A2C: 44.2 ml  LV vol s, MOD A4C: 70.2 ml  LV SV MOD A2C:     48.9 ml  LV SV MOD A4C:     145.0 ml  LV SV MOD BP:      64.0 ml   RIGHT VENTRICLE  RV S prime:     16.50 cm/s  TAPSE (M-mode): 2.8 cm   LEFT ATRIUM             Index        RIGHT ATRIUM           Index  LA diam:        3.80 cm 1.54 cm/m   RA Area:     14.60 cm  LA Vol (A2C):   63.1 ml 25.55 ml/m  RA Volume:   36.30 ml  14.70 ml/m  LA Vol (A4C):   76.9 ml 31.14 ml/m  LA Biplane Vol: 71.3 ml 28.87 ml/m   AORTIC VALVE                     PULMONIC VALVE  AV Area (Vmax):    2.10 cm      PV Vmax:       1.20 m/s  AV Area (Vmean):   2.09 cm      PV Peak grad:  5.8 mmHg  AV Area (VTI):     2.14 cm  AV Vmax:           198.00 cm/s  AV Vmean:          129.500 cm/s  AV VTI:            0.392 m  AV Peak Grad:      15.7 mmHg  AV Mean Grad:      8.0 mmHg  LVOT Vmax:         120.00 cm/s  LVOT Vmean:        78.300 cm/s  LVOT VTI:          0.242 m  LVOT/AV VTI ratio: 0.62  AORTA  Ao Root diam: 3.70 cm  Ao Asc diam:  3.80 cm   MITRAL VALVE  MV Area (PHT): 3.27 cm     SHUNTS  MV Decel Time: 232 msec     Systemic VTI:  0.24 m  MV E velocity: 120.00 cm/s  Systemic Diam: 2.10 cm  MV A velocity: 121.00 cm/s  MV E/A ratio:  0.99   Rudean Haskell MD  Electronically signed by Rudean Haskell MD  Signature Date/Time:  01/03/2022/2:12:00 PM        Final      Narrative & Impression  CLINICAL DATA:  Chest pain and shortness of breath.   EXAM: CT ANGIOGRAPHY CHEST WITH CONTRAST   TECHNIQUE: Multidetector CT imaging of the chest was performed using the standard protocol during bolus administration of intravenous contrast. Multiplanar CT image reconstructions and MIPs were obtained to evaluate the vascular anatomy.   RADIATION DOSE REDUCTION: This exam was performed according to the departmental dose-optimization program which includes automated exposure control, adjustment of the mA and/or kV according to patient size and/or use of iterative reconstruction technique.   CONTRAST:  29m OMNIPAQUE IOHEXOL 350 MG/ML SOLN   COMPARISON:  01/01/2022   FINDINGS: Cardiovascular: The heart is borderline enlarged but stable. Fusiform aneurysmal dilatation of the ascending thoracic aorta with maximum measurement of 5 cm. Ascending thoracic aortic aneurysm. Recommend semi-annual imaging followup by CTA or MRA and referral to cardiothoracic surgery if not already obtained. This recommendation follows 2010 ACCF/AHA/AATS/ACR/ASA/SCA/SCAI/SIR/STS/SVM Guidelines for the Diagnosis and Management of Patients With Thoracic Aortic Disease. Circulation. 2010; 121:: Z610-R604 Aortic aneurysm NOS (ICD10-I71.9) No dissection.   The pulmonary arterial tree is fairly well opacified. No filling defects to suggest pulmonary embolism.   Mediastinum/Nodes: Scattered mediastinal and hilar lymph nodes are stable. An NG tube is coursing down the esophagus and into the stomach.   Lungs/Pleura: Patchy ground-glass airspace disease in the lower lobes seen on the prior study is now dense airspace consolidation/pneumonia. Small associated pleural effusions. No pulmonary edema or worrisome pulmonary lesions. The endotracheal tube is in good position at the mid tracheal level.   Upper Abdomen: No significant upper abdominal  findings.   Musculoskeletal: No significant bony findings.   Review of the MIP images confirms the above findings.   IMPRESSION: 1. No CT findings for pulmonary embolism. 2. Fusiform aneurysmal dilatation of the ascending thoracic aorta with maximum measurement of 5 cm. Recommend semi-annual imaging followup by CTA or MRA and referral to cardiothoracic surgery if not already obtained. 3. Patchy ground-glass airspace disease in the lower lobes (seen on the prior study) is now dense airspace consolidation/pneumonia. Small associated pleural effusions. 4. ET tube and NG tubes in good position.     Electronically Signed   By: PMarijo SanesM.D.   On: 01/02/2022 15:55       Impression:  This 57year old gentleman has a 5 cm fusiform ascending aortic aneurysm noted incidentally on CTA of the chest in November 2023.  He had an echo at the same time that measured the aortic root at 3.7 cm and the ascending aorta at 3.8 cm although review of the echo shows that they were probably measuring the aortic root for both measurements.  His aneurysm is still below the surgical threshold of 5.5 cm.  His echocardiogram showed no aortic insufficiency but it was not possible to tell if he had a trileaflet or bicuspid aortic valve.  His previous CT scan in July 2022 showed the ascending aortic aneurysm to  measure about 5 cm at that time so it appears stable.  I reviewed the CT scan images with him and answered his questions.  I stressed the importance of continued good blood pressure control in preventing further enlargement of the aortic aneurysm and acute aortic dissection.  I advised him against doing any heavy lifting that may require a Valsalva maneuver and could suddenly raise his blood pressure to high levels.  I also cautioned him against discontinuing his blood pressure medication.  Plan:  He will return to see me in 1 year with a CTA of the chest for aortic surveillance.  I spent 45 minutes  performing this consultation and > 50% of this time was spent face to face counseling and coordinating the care of this patient's ascending aortic aneurysm.   Gaye Pollack, MD [facility] [phone]

## 2022-03-18 DIAGNOSIS — M79642 Pain in left hand: Secondary | ICD-10-CM | POA: Diagnosis not present

## 2022-03-18 DIAGNOSIS — M79641 Pain in right hand: Secondary | ICD-10-CM | POA: Diagnosis not present

## 2022-03-19 DIAGNOSIS — I10 Essential (primary) hypertension: Secondary | ICD-10-CM | POA: Diagnosis not present

## 2022-03-19 DIAGNOSIS — M868X7 Other osteomyelitis, ankle and foot: Secondary | ICD-10-CM | POA: Diagnosis not present

## 2022-03-19 DIAGNOSIS — I712 Thoracic aortic aneurysm, without rupture, unspecified: Secondary | ICD-10-CM | POA: Diagnosis not present

## 2022-03-19 DIAGNOSIS — I719 Aortic aneurysm of unspecified site, without rupture: Secondary | ICD-10-CM | POA: Diagnosis not present

## 2022-04-11 DIAGNOSIS — M19071 Primary osteoarthritis, right ankle and foot: Secondary | ICD-10-CM | POA: Diagnosis not present

## 2022-04-11 DIAGNOSIS — M25571 Pain in right ankle and joints of right foot: Secondary | ICD-10-CM | POA: Diagnosis not present

## 2022-04-18 DIAGNOSIS — E6609 Other obesity due to excess calories: Secondary | ICD-10-CM | POA: Diagnosis not present

## 2022-04-18 DIAGNOSIS — Z6836 Body mass index (BMI) 36.0-36.9, adult: Secondary | ICD-10-CM | POA: Diagnosis not present

## 2022-04-18 DIAGNOSIS — G894 Chronic pain syndrome: Secondary | ICD-10-CM | POA: Diagnosis not present

## 2022-05-21 DIAGNOSIS — G894 Chronic pain syndrome: Secondary | ICD-10-CM | POA: Diagnosis not present

## 2022-05-21 DIAGNOSIS — E538 Deficiency of other specified B group vitamins: Secondary | ICD-10-CM | POA: Diagnosis not present

## 2022-05-21 DIAGNOSIS — M7989 Other specified soft tissue disorders: Secondary | ICD-10-CM | POA: Diagnosis not present

## 2022-05-21 DIAGNOSIS — D518 Other vitamin B12 deficiency anemias: Secondary | ICD-10-CM | POA: Diagnosis not present

## 2022-05-21 DIAGNOSIS — T461X5A Adverse effect of calcium-channel blockers, initial encounter: Secondary | ICD-10-CM | POA: Diagnosis not present

## 2022-05-21 DIAGNOSIS — I10 Essential (primary) hypertension: Secondary | ICD-10-CM | POA: Diagnosis not present

## 2022-05-21 DIAGNOSIS — E559 Vitamin D deficiency, unspecified: Secondary | ICD-10-CM | POA: Diagnosis not present

## 2022-05-21 DIAGNOSIS — E6609 Other obesity due to excess calories: Secondary | ICD-10-CM | POA: Diagnosis not present

## 2022-05-21 DIAGNOSIS — Z6836 Body mass index (BMI) 36.0-36.9, adult: Secondary | ICD-10-CM | POA: Diagnosis not present

## 2022-06-12 DIAGNOSIS — M5431 Sciatica, right side: Secondary | ICD-10-CM | POA: Diagnosis not present

## 2022-06-12 DIAGNOSIS — M868X7 Other osteomyelitis, ankle and foot: Secondary | ICD-10-CM | POA: Diagnosis not present

## 2022-06-12 DIAGNOSIS — M179 Osteoarthritis of knee, unspecified: Secondary | ICD-10-CM | POA: Diagnosis not present

## 2022-06-12 DIAGNOSIS — I10 Essential (primary) hypertension: Secondary | ICD-10-CM | POA: Diagnosis not present

## 2022-07-22 DIAGNOSIS — M1991 Primary osteoarthritis, unspecified site: Secondary | ICD-10-CM | POA: Diagnosis not present

## 2022-07-22 DIAGNOSIS — I4891 Unspecified atrial fibrillation: Secondary | ICD-10-CM | POA: Diagnosis not present

## 2022-07-22 DIAGNOSIS — E6609 Other obesity due to excess calories: Secondary | ICD-10-CM | POA: Diagnosis not present

## 2022-07-22 DIAGNOSIS — G894 Chronic pain syndrome: Secondary | ICD-10-CM | POA: Diagnosis not present

## 2022-07-22 DIAGNOSIS — Z6838 Body mass index (BMI) 38.0-38.9, adult: Secondary | ICD-10-CM | POA: Diagnosis not present

## 2022-07-22 DIAGNOSIS — T461X5A Adverse effect of calcium-channel blockers, initial encounter: Secondary | ICD-10-CM | POA: Diagnosis not present

## 2022-07-22 DIAGNOSIS — I10 Essential (primary) hypertension: Secondary | ICD-10-CM | POA: Diagnosis not present

## 2022-07-22 DIAGNOSIS — I712 Thoracic aortic aneurysm, without rupture, unspecified: Secondary | ICD-10-CM | POA: Diagnosis not present

## 2022-07-23 DIAGNOSIS — N430 Encysted hydrocele: Secondary | ICD-10-CM | POA: Diagnosis not present

## 2022-07-29 ENCOUNTER — Emergency Department (HOSPITAL_COMMUNITY): Payer: Medicare HMO

## 2022-07-29 ENCOUNTER — Other Ambulatory Visit: Payer: Self-pay

## 2022-07-29 ENCOUNTER — Encounter (HOSPITAL_COMMUNITY): Payer: Self-pay | Admitting: Emergency Medicine

## 2022-07-29 ENCOUNTER — Inpatient Hospital Stay (HOSPITAL_COMMUNITY): Payer: Medicare HMO

## 2022-07-29 ENCOUNTER — Inpatient Hospital Stay (HOSPITAL_COMMUNITY)
Admission: EM | Admit: 2022-07-29 | Discharge: 2022-08-02 | DRG: 291 | Disposition: A | Discharge status: Home / Self Care | Payer: Medicare HMO | Attending: Family Medicine | Admitting: Family Medicine

## 2022-07-29 DIAGNOSIS — N433 Hydrocele, unspecified: Secondary | ICD-10-CM | POA: Diagnosis present

## 2022-07-29 DIAGNOSIS — Z96653 Presence of artificial knee joint, bilateral: Secondary | ICD-10-CM | POA: Diagnosis present

## 2022-07-29 DIAGNOSIS — D649 Anemia, unspecified: Secondary | ICD-10-CM | POA: Diagnosis not present

## 2022-07-29 DIAGNOSIS — I110 Hypertensive heart disease with heart failure: Principal | ICD-10-CM | POA: Diagnosis present

## 2022-07-29 DIAGNOSIS — Z8249 Family history of ischemic heart disease and other diseases of the circulatory system: Secondary | ICD-10-CM

## 2022-07-29 DIAGNOSIS — R569 Unspecified convulsions: Secondary | ICD-10-CM | POA: Diagnosis not present

## 2022-07-29 DIAGNOSIS — R0602 Shortness of breath: Secondary | ICD-10-CM | POA: Diagnosis not present

## 2022-07-29 DIAGNOSIS — J9811 Atelectasis: Secondary | ICD-10-CM | POA: Diagnosis not present

## 2022-07-29 DIAGNOSIS — J90 Pleural effusion, not elsewhere classified: Secondary | ICD-10-CM | POA: Diagnosis not present

## 2022-07-29 DIAGNOSIS — M7989 Other specified soft tissue disorders: Secondary | ICD-10-CM | POA: Diagnosis not present

## 2022-07-29 DIAGNOSIS — G4733 Obstructive sleep apnea (adult) (pediatric): Secondary | ICD-10-CM | POA: Diagnosis present

## 2022-07-29 DIAGNOSIS — Z9079 Acquired absence of other genital organ(s): Secondary | ICD-10-CM | POA: Diagnosis not present

## 2022-07-29 DIAGNOSIS — E781 Pure hyperglyceridemia: Secondary | ICD-10-CM | POA: Diagnosis present

## 2022-07-29 DIAGNOSIS — E876 Hypokalemia: Secondary | ICD-10-CM | POA: Diagnosis present

## 2022-07-29 DIAGNOSIS — I5031 Acute diastolic (congestive) heart failure: Secondary | ICD-10-CM | POA: Diagnosis not present

## 2022-07-29 DIAGNOSIS — I4891 Unspecified atrial fibrillation: Secondary | ICD-10-CM | POA: Diagnosis present

## 2022-07-29 DIAGNOSIS — I509 Heart failure, unspecified: Secondary | ICD-10-CM | POA: Diagnosis not present

## 2022-07-29 DIAGNOSIS — F1490 Cocaine use, unspecified, uncomplicated: Secondary | ICD-10-CM | POA: Diagnosis not present

## 2022-07-29 DIAGNOSIS — Z6835 Body mass index (BMI) 35.0-35.9, adult: Secondary | ICD-10-CM | POA: Diagnosis not present

## 2022-07-29 DIAGNOSIS — I4819 Other persistent atrial fibrillation: Secondary | ICD-10-CM | POA: Diagnosis present

## 2022-07-29 DIAGNOSIS — F1991 Other psychoactive substance use, unspecified, in remission: Secondary | ICD-10-CM | POA: Diagnosis present

## 2022-07-29 DIAGNOSIS — I7121 Aneurysm of the ascending aorta, without rupture: Secondary | ICD-10-CM | POA: Diagnosis present

## 2022-07-29 DIAGNOSIS — I10 Essential (primary) hypertension: Secondary | ICD-10-CM | POA: Diagnosis present

## 2022-07-29 DIAGNOSIS — I5021 Acute systolic (congestive) heart failure: Secondary | ICD-10-CM | POA: Diagnosis not present

## 2022-07-29 DIAGNOSIS — F10939 Alcohol use, unspecified with withdrawal, unspecified: Secondary | ICD-10-CM | POA: Diagnosis not present

## 2022-07-29 DIAGNOSIS — Z8546 Personal history of malignant neoplasm of prostate: Secondary | ICD-10-CM | POA: Diagnosis not present

## 2022-07-29 DIAGNOSIS — L509 Urticaria, unspecified: Secondary | ICD-10-CM | POA: Diagnosis not present

## 2022-07-29 DIAGNOSIS — R600 Localized edema: Secondary | ICD-10-CM | POA: Diagnosis not present

## 2022-07-29 DIAGNOSIS — Z79899 Other long term (current) drug therapy: Secondary | ICD-10-CM

## 2022-07-29 DIAGNOSIS — E669 Obesity, unspecified: Secondary | ICD-10-CM | POA: Diagnosis not present

## 2022-07-29 DIAGNOSIS — I444 Left anterior fascicular block: Secondary | ICD-10-CM | POA: Diagnosis present

## 2022-07-29 DIAGNOSIS — Z7901 Long term (current) use of anticoagulants: Secondary | ICD-10-CM

## 2022-07-29 DIAGNOSIS — I428 Other cardiomyopathies: Secondary | ICD-10-CM | POA: Diagnosis not present

## 2022-07-29 DIAGNOSIS — Z23 Encounter for immunization: Secondary | ICD-10-CM

## 2022-07-29 DIAGNOSIS — I43 Cardiomyopathy in diseases classified elsewhere: Secondary | ICD-10-CM | POA: Diagnosis not present

## 2022-07-29 DIAGNOSIS — R000 Tachycardia, unspecified: Secondary | ICD-10-CM | POA: Diagnosis not present

## 2022-07-29 DIAGNOSIS — M1990 Unspecified osteoarthritis, unspecified site: Secondary | ICD-10-CM | POA: Diagnosis present

## 2022-07-29 DIAGNOSIS — Z7151 Drug abuse counseling and surveillance of drug abuser: Secondary | ICD-10-CM

## 2022-07-29 DIAGNOSIS — G40901 Epilepsy, unspecified, not intractable, with status epilepticus: Secondary | ICD-10-CM | POA: Diagnosis present

## 2022-07-29 DIAGNOSIS — I5020 Unspecified systolic (congestive) heart failure: Secondary | ICD-10-CM | POA: Diagnosis not present

## 2022-07-29 LAB — TROPONIN I (HIGH SENSITIVITY)
Troponin I (High Sensitivity): 7 ng/L (ref <18)
Troponin I (High Sensitivity): 8 ng/L (ref <18)

## 2022-07-29 LAB — PHOSPHORUS: Phosphorus: 4.5 mg/dL (ref 2.5–4.6)

## 2022-07-29 LAB — BASIC METABOLIC PANEL
Anion gap: 9 (ref 5–15)
BUN: 16 mg/dL (ref 6–20)
CO2: 29 mmol/L (ref 22–32)
Calcium: 8.7 mg/dL — ABNORMAL LOW (ref 8.9–10.3)
Chloride: 96 mmol/L — ABNORMAL LOW (ref 98–111)
Creatinine, Ser: 0.85 mg/dL (ref 0.61–1.24)
GFR, Estimated: >60 mL/min (ref >60)
Glucose, Bld: 175 mg/dL — ABNORMAL HIGH (ref 70–99)
Potassium: 3.2 mmol/L — ABNORMAL LOW (ref 3.5–5.1)
Sodium: 134 mmol/L — ABNORMAL LOW (ref 135–145)

## 2022-07-29 LAB — CBC
HCT: 34.6 % — ABNORMAL LOW (ref 39.0–52.0)
Hemoglobin: 10.6 g/dL — ABNORMAL LOW (ref 13.0–17.0)
MCH: 24.9 pg — ABNORMAL LOW (ref 26.0–34.0)
MCHC: 30.6 g/dL (ref 30.0–36.0)
MCV: 81.2 fL (ref 80.0–100.0)
Platelets: 261 10*3/uL (ref 150–400)
RBC: 4.26 MIL/uL (ref 4.22–5.81)
RDW: 15.6 % — ABNORMAL HIGH (ref 11.5–15.5)
WBC: 10.5 10*3/uL (ref 4.0–10.5)
nRBC: 0 % (ref 0.0–0.2)

## 2022-07-29 LAB — MAGNESIUM: Magnesium: 1.6 mg/dL — ABNORMAL LOW (ref 1.7–2.4)

## 2022-07-29 LAB — BRAIN NATRIURETIC PEPTIDE: B Natriuretic Peptide: 540 pg/mL — ABNORMAL HIGH (ref 0.0–100.0)

## 2022-07-29 LAB — TSH: TSH: 1.547 u[IU]/mL (ref 0.350–4.500)

## 2022-07-29 LAB — MRSA NEXT GEN BY PCR, NASAL: MRSA by PCR Next Gen: NOT DETECTED

## 2022-07-29 MED ORDER — ACETAMINOPHEN 650 MG RE SUPP: 650.0000 mg | Freq: Four times a day (QID) | RECTAL | Status: DC | PRN

## 2022-07-29 MED ORDER — PNEUMOCOCCAL 20-VAL CONJ VACC 0.5 ML IM SUSY
0.5000 mL | PREFILLED_SYRINGE | INTRAMUSCULAR | Status: AC
  Administered 2022-08-01: 0.5 mL via INTRAMUSCULAR
  Filled 2022-07-29: qty 0.5

## 2022-07-29 MED ORDER — FOLIC ACID 1 MG PO TABS
1.0000 mg | ORAL_TABLET | Freq: Every day | ORAL | Status: DC
  Administered 2022-07-30 – 2022-08-02 (×4): 1 mg via ORAL
  Filled 2022-07-29 (×4): qty 1

## 2022-07-29 MED ORDER — DILTIAZEM HCL-DEXTROSE 125-5 MG/125ML-% IV SOLN (PREMIX)
5.0000 mg/h | INTRAVENOUS | Status: DC
  Administered 2022-07-29: 5 mg/h via INTRAVENOUS
  Administered 2022-07-29: 10 mg/h via INTRAVENOUS
  Administered 2022-07-30: 5 mg/h via INTRAVENOUS
  Administered 2022-07-31: 15 mg/h via INTRAVENOUS
  Filled 2022-07-29 (×4): qty 125

## 2022-07-29 MED ORDER — IPRATROPIUM-ALBUTEROL 0.5-2.5 (3) MG/3ML IN SOLN: 3.0000 mL | RESPIRATORY_TRACT | Status: DC | PRN

## 2022-07-29 MED ORDER — THIAMINE MONONITRATE 100 MG PO TABS
100.0000 mg | ORAL_TABLET | Freq: Every day | ORAL | Status: DC
  Administered 2022-07-30 – 2022-08-02 (×4): 100 mg via ORAL
  Filled 2022-07-29 (×4): qty 1

## 2022-07-29 MED ORDER — HEPARIN (PORCINE) 25000 UT/250ML-% IV SOLN
2500.0000 [IU]/h | INTRAVENOUS | Status: DC
  Administered 2022-07-29: 1600 [IU]/h via INTRAVENOUS
  Administered 2022-07-30: 2050 [IU]/h via INTRAVENOUS
  Administered 2022-07-30: 1900 [IU]/h via INTRAVENOUS
  Filled 2022-07-29 (×4): qty 250

## 2022-07-29 MED ORDER — MAGNESIUM SULFATE 2 GM/50ML IV SOLN
2.0000 g | Freq: Once | INTRAVENOUS | Status: AC
  Administered 2022-07-29: 2 g via INTRAVENOUS
  Filled 2022-07-29: qty 50

## 2022-07-29 MED ORDER — ACETAMINOPHEN 325 MG PO TABS
650.0000 mg | ORAL_TABLET | Freq: Four times a day (QID) | ORAL | Status: DC | PRN
  Administered 2022-07-31: 650 mg via ORAL
  Filled 2022-07-29: qty 2

## 2022-07-29 MED ORDER — POTASSIUM CHLORIDE CRYS ER 20 MEQ PO TBCR
40.0000 meq | EXTENDED_RELEASE_TABLET | Freq: Once | ORAL | Status: AC
  Administered 2022-07-29: 40 meq via ORAL
  Filled 2022-07-29: qty 2

## 2022-07-29 MED ORDER — DILTIAZEM HCL 25 MG/5ML IV SOLN
10.0000 mg | Freq: Once | INTRAVENOUS | Status: AC
  Administered 2022-07-29: 10 mg via INTRAVENOUS
  Filled 2022-07-29: qty 5

## 2022-07-29 MED ORDER — LORAZEPAM 1 MG PO TABS: 1.0000 mg | ORAL_TABLET | ORAL | Status: AC | PRN

## 2022-07-29 MED ORDER — LORAZEPAM 2 MG/ML IJ SOLN
1.0000 mg | INTRAMUSCULAR | Status: AC | PRN
  Administered 2022-07-30: 2 mg via INTRAVENOUS
  Filled 2022-07-29: qty 1

## 2022-07-29 MED ORDER — THIAMINE HCL 100 MG/ML IJ SOLN: 100.0000 mg | Freq: Every day | INTRAMUSCULAR | Status: DC

## 2022-07-29 MED ORDER — ADULT MULTIVITAMIN W/MINERALS CH
1.0000 | ORAL_TABLET | Freq: Every day | ORAL | Status: DC
  Administered 2022-07-30 – 2022-08-02 (×4): 1 via ORAL
  Filled 2022-07-29 (×4): qty 1

## 2022-07-29 MED ORDER — HEPARIN BOLUS VIA INFUSION
5000.0000 [IU] | Freq: Once | INTRAVENOUS | Status: AC
  Administered 2022-07-29: 5000 [IU] via INTRAVENOUS
  Filled 2022-07-29: qty 5000

## 2022-07-29 MED ORDER — CHLORHEXIDINE GLUCONATE CLOTH 2 % EX PADS
6.0000 | MEDICATED_PAD | Freq: Every day | CUTANEOUS | Status: DC
  Administered 2022-07-29 – 2022-08-02 (×6): 6 via TOPICAL

## 2022-07-29 MED ORDER — FUROSEMIDE 10 MG/ML IJ SOLN
40.0000 mg | Freq: Two times a day (BID) | INTRAMUSCULAR | Status: DC
  Administered 2022-07-30 – 2022-08-02 (×7): 40 mg via INTRAVENOUS
  Filled 2022-07-29 (×7): qty 4

## 2022-07-29 MED ORDER — FUROSEMIDE 10 MG/ML IJ SOLN
80.0000 mg | Freq: Once | INTRAMUSCULAR | Status: AC
  Administered 2022-07-29: 80 mg via INTRAVENOUS
  Filled 2022-07-29: qty 8

## 2022-07-29 MED ORDER — IOHEXOL 350 MG/ML SOLN
80.0000 mL | Freq: Once | INTRAVENOUS | Status: AC | PRN
  Administered 2022-07-29: 80 mL via INTRAVENOUS

## 2022-07-29 MED ORDER — ORAL CARE MOUTH RINSE: 15.0000 mL | OROMUCOSAL | Status: DC | PRN

## 2022-07-29 MED ORDER — POLYETHYLENE GLYCOL 3350 17 G PO PACK: 17.0000 g | PACK | Freq: Every day | ORAL | Status: DC | PRN

## 2022-07-29 NOTE — ED Notes (Signed)
Patient transported to X-ray 

## 2022-07-29 NOTE — ED Notes (Signed)
Pt states he has an aneurysm that was discovered a couple of months ago, MD made aware

## 2022-07-29 NOTE — ED Triage Notes (Addendum)
Pt via POV c/o bilateral 1+ pitting leg edema x 2 months and recent dx a fib per PCP. Pt has been taking furosemide but swelling has not improved. He developed SOB while rowing a boat last week and has since been unable to catch his breath, and he is having difficulty ambulating due to pain from swelling and SOB. Pt also notes he was diagnosed with right-sided hydrocele last week.

## 2022-07-29 NOTE — Assessment & Plan Note (Addendum)
Reports no alcoholic beverage in the past 2 weeks due to SOB, but previously drank 3-4 beers twice a week. - Repleted electrolytes - CIWA was ordered, but NO ACUTE ALCOHOL WITHDRAWAL in hospital - Thiamine, folate, multivitamins

## 2022-07-29 NOTE — Plan of Care (Signed)
  Problem: Education: Goal: Knowledge of General Education information will improve Description: Including pain rating scale, medication(s)/side effects and non-pharmacologic comfort measures Outcome: Progressing   Problem: Health Behavior/Discharge Planning: Goal: Ability to manage health-related needs will improve Outcome: Progressing   Problem: Clinical Measurements: Goal: Ability to maintain clinical measurements within normal limits will improve Outcome: Progressing Goal: Will remain free from infection Outcome: Progressing Goal: Diagnostic test results will improve Outcome: Progressing Goal: Respiratory complications will improve Outcome: Progressing Goal: Cardiovascular complication will be avoided Outcome: Progressing   Problem: Activity: Goal: Risk for activity intolerance will decrease Outcome: Progressing   Problem: Nutrition: Goal: Adequate nutrition will be maintained Outcome: Progressing   Problem: Coping: Goal: Level of anxiety will decrease Outcome: Progressing   Problem: Elimination: Goal: Will not experience complications related to bowel motility Outcome: Progressing Goal: Will not experience complications related to urinary retention Outcome: Progressing   Problem: Pain Managment: Goal: General experience of comfort will improve Outcome: Progressing   Problem: Safety: Goal: Ability to remain free from injury will improve Outcome: Progressing   Problem: Skin Integrity: Goal: Risk for impaired skin integrity will decrease Outcome: Progressing   Problem: Education: Goal: Ability to demonstrate management of disease process will improve Outcome: Progressing Goal: Ability to verbalize understanding of medication therapies will improve Outcome: Progressing Goal: Individualized Educational Video(s) Outcome: Progressing   Problem: Activity: Goal: Capacity to carry out activities will improve Outcome: Progressing   Problem: Cardiac: Goal:  Ability to achieve and maintain adequate cardiopulmonary perfusion will improve Outcome: Progressing   Problem: Education: Goal: Knowledge of disease or condition will improve Outcome: Progressing Goal: Understanding of medication regimen will improve Outcome: Progressing Goal: Individualized Educational Video(s) Outcome: Progressing   Problem: Activity: Goal: Ability to tolerate increased activity will improve Outcome: Progressing   Problem: Cardiac: Goal: Ability to achieve and maintain adequate cardiopulmonary perfusion will improve Outcome: Progressing   Problem: Health Behavior/Discharge Planning: Goal: Ability to safely manage health-related needs after discharge will improve Outcome: Progressing   

## 2022-07-29 NOTE — Assessment & Plan Note (Addendum)
He presented with 2+ bilateral lower extremity pitting edema, abdominal bloating.  In setting of atrial fibrillation with RVR.  Reports baseline weight of about 265 pounds.  He presented at a weight of 285 pounds and diuresed down to a weight of 268 pounds on discharge day.    BNP was elevated at 540.  Echo 12/2021, EF 55 to 60%, LV diastolic function undetermined Repeat Echo 07/30/22: LVEF 30-35% global hypokinesis, no LV mural thrombus -IV Lasix 80 mg x 1 given in ED, continued IV furosemide 40 twice daily thru 6/22 -Input output, daily weights, daily BMP in hospital -BiIateral venous Dopplers negative for DVT -He initially appeared quite dyspneic with obvious increased work of breathing, clear lungs on auscultation, but he is not hypoxic, CTA chest ruled out PE, showed small right pleural effusion and mild cardiomegaly. -started on toprol XL 6/19 by cardiology and increased to 150 mg daily 6/21 -spironolactone 12.5 mg daily started 6/21 -losartan 12.5 mg daily added prior to discharge -Pt has diuresed 7.5L since admission and feeling much better.  -His K remains borderline low at 3.5 will DC home on oral K 20 meq daily.   -DC home with close outpatient follow up and recheck labs outpatient BMP in 1 week with PCP and in 3 weeks with cardiology office.    Intake/Output Summary (Last 24 hours) at 08/02/2022 1029 Last data filed at 08/02/2022 0500 Gross per 24 hour  Intake 360 ml  Output 1500 ml  Net -1140 ml   Filed Weights   08/01/22 0500 08/01/22 1047 08/02/22 0500  Weight: 122.8 kg 122 kg 121.6 kg   ReDs Vest Reading on 6/21 was 38

## 2022-07-29 NOTE — ED Provider Notes (Signed)
[place] EMERGENCY DEPARTMENT AT [facility] Provider Note   CSN: [id number] Arrival date & time: 07/29/22  1524     History  Chief Complaint  Patient presents with   Leg Swelling   Atrial Fibrillation    Andrew Haley is a 57 y.o. male.  Patient with marked swelling to his legs has been going on for 2 months.  He has been feeling short of breath for at least 3 days maybe longer.  Denies any chest pain.  Has been getting very winded.  Does not have a known history of atrial fibs but said that his primary Dr. Carlena Sax made some mention that may be he had some evidence of that and they were referring him to cardiology.  Patient not on blood thinners.  Nothing on to control and atrial rate.  Arrives here with heart rate in the 140s.  Past medical history significant for hypertension high triglycerides prostate cancer sleep apnea obesity.  Patient is never used tobacco products.  Patient has not noted any palpitation has not noted that his heart rate was fast       Home Medications Prior to Admission medications   Medication Sig Start Date End Date Taking? Authorizing Provider  amLODipine (NORVASC) 10 MG tablet Take 10 mg by mouth daily.    [provider]  diclofenac (VOLTAREN) 75 MG EC tablet Take 75 mg by mouth 2 (two) times daily.    [provider]  furosemide (LASIX) 40 MG tablet Take 40 mg by mouth 2 (two) times daily.    [provider]  losartan (COZAAR) 100 MG tablet Take 100 mg by mouth daily.    [provider]  losartan-hydrochlorothiazide (HYZAAR) 50-12.5 MG tablet Take 1 tablet by mouth daily. 07/19/20 07/19/21  Vassie Loll, MD  naloxone [facility]) nasal spray 4 mg/0.1 mL Spray into nostrils if patient shows signs of opioid overdose, then call 911 Patient not taking: Reported on 03/05/2022 03/07/21   Cassandria Anger, PA-C  Oxycodone HCl 10 MG TABS Take 10 mg by mouth every 4 (four) hours as needed (pain).    [provider]  traZODone (DESYREL) 100 MG tablet Take 100 mg by mouth at bedtime as needed for sleep.    [provider]      Allergies    Patient has no known allergies.    Review of Systems   Review of Systems  Constitutional:  Negative for chills and fever.  HENT:  Negative for ear pain and sore throat.   Eyes:  Negative for pain and visual disturbance.  Respiratory:  Positive for shortness of breath. Negative for cough.   Cardiovascular:  Positive for leg swelling. Negative for chest pain and palpitations.  Gastrointestinal:  Negative for abdominal pain and vomiting.  Genitourinary:  Negative for dysuria and hematuria.  Musculoskeletal:  Negative for arthralgias and back pain.  Skin:  Negative for color change and rash.  Neurological:  Negative for seizures and syncope.  All other systems reviewed and are negative.   Physical Exam Updated Vital Signs BP (!) 129/91   Pulse (!) 109   Temp 97.9 F (36.6 C) (Oral)   Resp (!) 24   Ht 1.905 m (6\' 3" )   Wt 129.3 kg   SpO2 92%   BMI 35.62 kg/m  Physical Exam Vitals and nursing note reviewed.  Constitutional:      General: He is not in acute distress.    Appearance: Normal appearance. He is well-developed.  HENT:     Head: Normocephalic and atraumatic.     Mouth/Throat:     Mouth: Mucous membranes are moist.  Eyes:     Extraocular Movements: Extraocular movements intact.     Conjunctiva/sclera: Conjunctivae normal.     Pupils: Pupils are equal, round, and reactive to light.  Cardiovascular:     Rate and Rhythm: Tachycardia present. Rhythm irregular.     Heart sounds: No murmur heard. Pulmonary:     Effort: Pulmonary effort is normal. No respiratory distress.     Breath sounds: Normal breath sounds.  Abdominal:     Palpations: Abdomen is soft.     Tenderness: There is no abdominal tenderness.  Musculoskeletal:        General: No swelling.     Cervical back: Normal range of motion and neck supple.     Right lower  leg: Edema present.     Left lower leg: Edema present.     Comments: Bilateral lower extremity swelling with chronic skin changes.  Skin:    General: Skin is warm and dry.     Capillary Refill: Capillary refill takes less than 2 seconds.  Neurological:     General: No focal deficit present.     Mental Status: He is alert and oriented to person, place, and time.  Psychiatric:        Mood and Affect: Mood normal.     ED Results / Procedures / Treatments   Labs (all labs ordered are listed, but only abnormal results are displayed) Labs Reviewed  BASIC METABOLIC PANEL - Abnormal; Notable for the following components:      Result Value   Sodium 134 (*)    Potassium 3.2 (*)    Chloride 96 (*)    Glucose, Bld 175 (*)    Calcium 8.7 (*)    All other components within normal limits  CBC - Abnormal; Notable for the following components:   Hemoglobin 10.6 (*)    HCT 34.6 (*)    MCH 24.9 (*)    RDW 15.6 (*)    All other components within normal limits  BRAIN NATRIURETIC PEPTIDE - Abnormal; Notable for the following components:   B Natriuretic Peptide 540.0 (*)    All other components within normal limits    EKG EKG Interpretation  Date/Time:  Tuesday July 29 2022 15:52:43 EDT Ventricular Rate:  145 PR Interval:    QRS Duration: 108 QT Interval:  324 QTC Calculation: 504 R Axis:   -62 Text Interpretation: Atrial fibrillation Left anterior fascicular block Abnormal R-wave progression, late transition Borderline T wave abnormalities Prolonged QT interval Confirmed by Vanetta Mulders [phone]) on 07/29/2022 4:50:00 PM  Radiology US Venous Img Lower Bilateral (DVT)  Result Date: 07/29/2022 CLINICAL DATA:  141880 SOB (shortness of breath) [id number] EXAM: BILATERAL LOWER EXTREMITY VENOUS DOPPLER ULTRASOUND TECHNIQUE: Gray-scale sonography with graded compression, as well as color Doppler and duplex ultrasound were performed to evaluate the lower extremity deep venous systems from the  level of the common femoral vein and including the common femoral, femoral, profunda femoral, popliteal and calf veins including the posterior tibial, peroneal and gastrocnemius veins when visible. The superficial great saphenous vein was also interrogated. Spectral Doppler was utilized to evaluate flow at rest and with distal augmentation maneuvers in the common femoral, femoral and popliteal veins. COMPARISON:  Chest XR, concurrent. FINDINGS: RIGHT LOWER EXTREMITY VENOUS Normal compressibility of the RIGHT common femoral, superficial femoral, and popliteal veins, as well as the visualized calf  veins. Visualized portions of profunda femoral vein and great saphenous vein unremarkable. No filling defects to suggest DVT on grayscale or color Doppler imaging. Doppler waveforms show normal direction of venous flow, normal respiratory plasticity and response to augmentation. OTHER No evidence of superficial thrombophlebitis or abnormal fluid collection. Limitations: Patient body habitus LEFT LOWER EXTREMITY VENOUS Normal compressibility of the LEFT common femoral, superficial femoral, and popliteal veins, as well as the visualized calf veins. Visualized portions of profunda femoral vein and great saphenous vein unremarkable. No filling defects to suggest DVT on grayscale or color Doppler imaging. Doppler waveforms show normal direction of venous flow, normal respiratory plasticity and response to augmentation. Limited views of the contralateral common femoral vein are unremarkable. OTHER No evidence of superficial thrombophlebitis or abnormal fluid collection. Limitations: Patient body habitus IMPRESSION: No evidence of femoropopliteal DVT or superficial thrombophlebitis within either lower extremity. Roanna Banning, MD [facility] Radiology Electronically Signed   By: Roanna Banning M.D.   On: 07/29/2022 17:04   DG Chest 2 View  Result Date: 07/29/2022 CLINICAL DATA:  SOB  EXAM: CHEST - 2 VIEW COMPARISON:  Lower extremity DVT ultrasound, concurrent. Chest XR, 01/08/2022. CTA chest 01/02/2022. FINDINGS: Enlargement of the cardiac silhouette. Hypoinflation. No focal consolidation or mass. No pleural effusion or pneumothorax. No acute displaced fracture. IMPRESSION: Cardiomegaly and hypoinflation. No acute superimposed cardiopulmonary process. Electronically Signed   By: Roanna Banning M.D.   On: 07/29/2022 16:49    Procedures Procedures    Medications Ordered in ED Medications  diltiazem (CARDIZEM) 125 mg in dextrose 5% 125 mL (1 mg/mL) infusion (5 mg/hr Intravenous New Bag/Given 07/29/22 1630)  diltiazem (CARDIZEM) injection 10 mg (10 mg Intravenous Given 07/29/22 1627)  furosemide (LASIX) injection 80 mg (80 mg Intravenous Given 07/29/22 1718)    ED Course/ Medical Decision Making/ A&P                             Medical Decision Making Amount and/or Complexity of Data Reviewed Labs: ordered. Radiology: ordered.  Risk Prescription drug management. Decision regarding hospitalization.   I provided a substantive portion of the care of this patient.  I personally made/approved the management plan for this patient and take responsibility for the patient management.   CRITICAL CARE Performed by: Vanetta Mulders Total critical care time: 45 minutes Critical care time was exclusive of separately billable procedures and treating other patients. Critical care was necessary to treat or prevent imminent or life-threatening deterioration. Critical care was time spent personally by me on the following activities: development of treatment plan with patient and/or surrogate as well as nursing, discussions with consultants, evaluation of patient's response to treatment, examination of patient, obtaining history from patient or surrogate, ordering and performing treatments and interventions, ordering and review of laboratory studies, ordering and review of radiographic  studies, pulse oximetry and re-evaluation of patient's condition.  EKG Interpretation  Date/Time:  Tuesday July 29 2022 15:52:43 EDT Ventricular Rate:  145 PR Interval:    QRS Duration: 108 QT Interval:  324 QTC Calculation: 504 R Axis:   -62 Text Interpretation: Atrial fibrillation Left anterior fascicular block Abnormal R-wave progression, late transition Borderline T wave abnormalities Prolonged QT interval Confirmed by Vanetta Mulders [phone]) on 07/29/2022 4:50:00 PM   Patient with rapid atrial fibrillation.  Patient not on blood thinners.  Patient denies any chest pain.  Will start diltiazem 10 mg IV then drip.  Heart rate  is improving on that.  Heart rate down to around 112.  Chest x-ray without evidence of pulmonary edema.  Basic metabolic panel sodium is 134 potassium down a little bit at 3.2 will give some oral potassium glucose 175 BUN and creatinine normal.  CBC without leukocytosis hemoglobin down a little bit at 10.6 platelets normal.    Bilateral Doppler studies without evidence of DVT.  Patient also given some IV Lasix.  BNP pending.  Final Clinical Impression(s) / ED Diagnoses Final diagnoses:  Leg edema  Atrial fibrillation with [facility])    Rx / DC Orders ED Discharge Orders     None         Vanetta Mulders, MD 07/29/22 1757

## 2022-07-29 NOTE — Assessment & Plan Note (Signed)
CT ED chest today-  Stable dilatation of the ascending aorta measuring 4.7 cm. Recommend semi-annual imaging followup by CTA or MRA and referral to cardiothoracic surgery if not already obtained. -Patient follows routinely with his vascular surgeon.

## 2022-07-29 NOTE — Progress Notes (Signed)
ANTICOAGULATION CONSULT NOTE - Initial Consult  Pharmacy Consult for heparin Indication: atrial fibrillation  No Known Allergies  Patient Measurements: Height: 6\' 3"  (190.5 cm) Weight: 125.2 kg (276 lb 0.3 oz) IBW/kg (Calculated) : 84.5 Heparin Dosing Weight: 111 kg  Vital Signs: Temp: 98.5 F (36.9 C) (06/18 2100) Temp Source: Oral (06/18 2100) BP: 117/93 (06/18 2100) Pulse Rate: 47 (06/18 2100)  Labs: Recent Labs    07/29/22 1557  HGB 10.6*  HCT 34.6*  PLT 261  CREATININE 0.85  TROPONINIHS 7    Estimated Creatinine Clearance: 135.1 mL/min (by C-G formula based on SCr of 0.85 mg/dL).   Medical History: Past Medical History:  Diagnosis Date   Arthritis    Class 1 obesity 07/18/2020   Complication of anesthesia    slow to wake up   Essential hypertension 07/18/2020   Headache    Hypertension    Hypertriglyceridemia    Numbness of fingers of both hands    Prostate cancer (HCC) 2017   Sleep apnea    CPAP    Medications:  Medications Prior to Admission  Medication Sig Dispense Refill Last Dose   amLODipine (NORVASC) 10 MG tablet Take 10 mg by mouth daily.      diclofenac (VOLTAREN) 75 MG EC tablet Take 75 mg by mouth 2 (two) times daily.      furosemide (LASIX) 40 MG tablet Take 40 mg by mouth 2 (two) times daily.      losartan (COZAAR) 100 MG tablet Take 100 mg by mouth daily.      losartan-hydrochlorothiazide (HYZAAR) 50-12.5 MG tablet Take 1 tablet by mouth daily. 30 tablet 11    naloxone (NARCAN) nasal spray 4 mg/0.1 mL Spray into nostrils if patient shows signs of opioid overdose, then call 911 (Patient not taking: Reported on 03/05/2022) 1 each 0    Oxycodone HCl 10 MG TABS Take 10 mg by mouth every 4 (four) hours as needed (pain).      traZODone (DESYREL) 100 MG tablet Take 100 mg by mouth at bedtime as needed for sleep.       Assessment: Pharmacy consulted to dose heparin in patient with atrial fibrillation.  Patient is not on anticoagulation prior  to admission.   Goal of Therapy:  Heparin level 0.3-0.7 units/ml Monitor platelets by anticoagulation protocol: Yes   Plan:  Give 5000 units bolus x 1 Start heparin infusion at 1600 units/hr Check anti-Xa level in 6 hours and daily while on heparin Continue to monitor H&H and platelets  Tad Moore 07/29/2022,9:11 PM

## 2022-07-29 NOTE — Assessment & Plan Note (Addendum)
--   working to replete Mg and K -- Mg improved to 2.2 -- additional oral potassium ordered and spironolactone started by cardiology 6/21 -- recommended pt to see PCP for BMP in 1 week and cardiology office in 3-4 weeks for BMP check    --he will DC home on oral K 20 meq daily

## 2022-07-29 NOTE — H&P (Addendum)
History and Physical    Andrew Haley:[id number] DOB: [birth year] DOA: 07/29/2022  PCP: Elfredia Nevins, MD   Patient coming from: Home   I have personally briefly reviewed patient's old medical records in [facility]  Chief Complaint: Leg swelling, SOB  HPI: Andrew Haley is a 57 y.o. male with medical history significant for hypertension, prostate cancer, status epilepticus.  Patient presented to the ED with complaints of bilateral lower extremity swelling over the past 2 months, at baseline he reports some swelling to his left lower extremity only.  Reports abdominal bloating, and weight gain.  Reports baseline weight is about 265 pounds. Over the past 3 to 4 days he has had increasing difficulty breathing.  Reports, he was rowing a boat over the weekends- 3 days ago,- about 5 miles stretch, really exerted himself, and since then he has not been able to catch his breath.  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  No difficulty breathing.  Never smoked cigarettes.  No chest pain.  No palpitation.  No cardiac history.  ED Course: Tmax 98.7.  Heart rate 109-154.  Respiratory rate 14-24.  Blood pressure systolic 1 17-1 36.  O2 sats 92 to 98% on room air. BNP elevated at 540.  WBC 10.5.  Potassium 3.2.  Chest x-ray negative for acute abnormality.  Bilateral lower extremity venous Dopplers negative for DVT.  EKG shows atrial fibrillation with rate 145. Patient was started on Cardizem, 10 mg bolus and drip.  With improvement in heart rate.  IV Lasix 80 mg x 1 given.  Review of Systems: As per HPI all other systems reviewed and negative.  Past Medical History:  Diagnosis Date   Arthritis    Class 1 obesity 07/18/2020   Complication of anesthesia    slow to wake up   Essential hypertension 07/18/2020   Headache    Hypertension    Hypertriglyceridemia    Numbness of fingers of both hands    Prostate  cancer (HCC) 2017   Sleep apnea    CPAP    Past Surgical History:  Procedure Laterality Date   ANKLE SURGERY Left    COLONOSCOPY WITH PROPOFOL N/A 09/28/2017   Procedure: COLONOSCOPY WITH PROPOFOL;  Surgeon: Corbin Ade, MD;  Location: [facility];  Service: Endoscopy;  Laterality: N/A;  12:00pm   HAND SURGERY Right 2003   cysts    JOINT REPLACEMENT     LESION REMOVAL Left 09/23/2013   Procedure: MINOR EXCISION 3 CM SKIN LESION OF LEFT THIGH ;  Surgeon: Dalia Heading, MD;  Location: [facility];  Service: General;  Laterality: Left;   LYMPHADENECTOMY Bilateral 11/29/2015   Procedure: LYMPHADENECTOMY;  Surgeon: Heloise Purpura, MD;  Location: [facility];  Service: Urology;  Laterality: Bilateral;   ROBOT ASSISTED LAPAROSCOPIC RADICAL PROSTATECTOMY N/A 11/29/2015   Procedure: XI ROBOTIC ASSISTED LAPAROSCOPIC RADICAL PROSTATECTOMY LEVEL 3;  Surgeon: Heloise Purpura, MD;  Location: [facility];  Service: Urology;  Laterality: N/A;   TOTAL KNEE ARTHROPLASTY Left 02/22/2014   Procedure: LEFT TOTAL KNEE ARTHROPLASTY;  Surgeon: Jacki Cones, MD;  Location: [facility];  Service: Orthopedics;  Laterality: Left;   TOTAL KNEE ARTHROPLASTY Right 05/23/2014   Procedure: RIGHT TOTAL KNEE ARTHROPLASTY;  Surgeon: Ranee Gosselin, MD;  Location: [facility];  Service: Orthopedics;  Laterality: Right;   TOTAL KNEE REVISION Right 03/04/2021   Procedure: TOTAL KNEE REVISION;  Surgeon:  Durene Romans, MD;  Location: [facility];  Service: Orthopedics;  Laterality: Right;     reports that he has never smoked. He has never used smokeless tobacco. He reports current alcohol use. He reports that he does not currently use drugs after having used the following drugs: Cocaine.  No Known Allergies  Family History  Problem Relation Age of Onset   Heart disease Mother    Throat cancer Father    Colon cancer Neg Hx    Colon polyps Neg Hx     Prior to Admission medications   Medication Sig Start Date End Date Taking? Authorizing  Provider  amLODipine (NORVASC) 10 MG tablet Take 10 mg by mouth daily.    [provider]  diclofenac (VOLTAREN) 75 MG EC tablet Take 75 mg by mouth 2 (two) times daily.    [provider]  furosemide (LASIX) 40 MG tablet Take 40 mg by mouth 2 (two) times daily.    [provider]  losartan (COZAAR) 100 MG tablet Take 100 mg by mouth daily.    [provider]  losartan-hydrochlorothiazide (HYZAAR) 50-12.5 MG tablet Take 1 tablet by mouth daily. 07/19/20 07/19/21  Vassie Loll, MD  naloxone [facility]) nasal spray 4 mg/0.1 mL Spray into nostrils if patient shows signs of opioid overdose, then call 911 Patient not taking: Reported on 03/05/2022 03/07/21   Cassandria Anger, PA-C  Oxycodone HCl 10 MG TABS Take 10 mg by mouth every 4 (four) hours as needed (pain).    [provider]  traZODone (DESYREL) 100 MG tablet Take 100 mg by mouth at bedtime as needed for sleep.    [provider]    Physical Exam: Vitals:   07/29/22 1645 07/29/22 1715 07/29/22 1723 07/29/22 1745  BP: (!) 117/92 (!) 129/91  (!) 121/98  Pulse: (!) 154 (!) 109  (!) 118  Resp: 18 (!) 24  (!) 21  Temp:   97.9 F (36.6 C)   TempSrc:   Oral   SpO2: 95% 92%  96%  Weight:      Height:        Constitutional: Moderate increased work of breathing, especially when making long sentences Vitals:   07/29/22 1645 07/29/22 1715 07/29/22 1723 07/29/22 1745  BP: (!) 117/92 (!) 129/91  (!) 121/98  Pulse: (!) 154 (!) 109  (!) 118  Resp: 18 (!) 24  (!) 21  Temp:   97.9 F (36.6 C)   TempSrc:   Oral   SpO2: 95% 92%  96%  Weight:      Height:       Eyes: PERRL, lids and conjunctivae normal ENMT: Mucous membranes are moist.   Neck: normal, supple, no masses, no thyromegaly Respiratory: Moderate increased work of breathing worsens when making long sentences, currently on room air, with sats above 95% on my evaluation,.  Cardiovascular: Tachycardic, Irregular rate and rhythm, no  murmurs / rubs / gallops.  2+ pitting bilateral lower extremity edema to knees Abdomen: Distended, no tenderness, not tense, no masses palpated. No hepatosplenomegaly.  Musculoskeletal: no clubbing / cyanosis. No joint deformity upper and lower extremities.  Skin: no rashes, lesions, ulcers. No induration Neurologic: No apparent cranial nerve abnormality, moving extremities spontaneously Psychiatric: Normal judgment and insight. Alert and oriented x 3. Normal mood.   Labs on Admission: I have personally reviewed following labs and imaging studies  CBC: Recent Labs  Lab 07/29/22 1557  WBC 10.5  HGB 10.6*  HCT 34.6*  MCV 81.2  PLT 261   Basic Metabolic Panel: Recent Labs  Lab 07/29/22 1557  NA 134*  K 3.2*  CL 96*  CO2 29  GLUCOSE 175*  BUN 16  CREATININE 0.85  CALCIUM 8.7*    Radiological Exams on Admission: US Venous Img Lower Bilateral (DVT)  Result Date: 07/29/2022 CLINICAL DATA:  141880 SOB (shortness of breath) [id number] EXAM: BILATERAL LOWER EXTREMITY VENOUS DOPPLER ULTRASOUND TECHNIQUE: Gray-scale sonography with graded compression, as well as color Doppler and duplex ultrasound were performed to evaluate the lower extremity deep venous systems from the level of the common femoral vein and including the common femoral, femoral, profunda femoral, popliteal and calf veins including the posterior tibial, peroneal and gastrocnemius veins when visible. The superficial great saphenous vein was also interrogated. Spectral Doppler was utilized to evaluate flow at rest and with distal augmentation maneuvers in the common femoral, femoral and popliteal veins. COMPARISON:  Chest XR, concurrent. FINDINGS: RIGHT LOWER EXTREMITY VENOUS Normal compressibility of the RIGHT common femoral, superficial femoral, and popliteal veins, as well as the visualized calf veins. Visualized portions of profunda femoral vein and great saphenous vein unremarkable. No filling defects to suggest DVT on  grayscale or color Doppler imaging. Doppler waveforms show normal direction of venous flow, normal respiratory plasticity and response to augmentation. OTHER No evidence of superficial thrombophlebitis or abnormal fluid collection. Limitations: Patient body habitus LEFT LOWER EXTREMITY VENOUS Normal compressibility of the LEFT common femoral, superficial femoral, and popliteal veins, as well as the visualized calf veins. Visualized portions of profunda femoral vein and great saphenous vein unremarkable. No filling defects to suggest DVT on grayscale or color Doppler imaging. Doppler waveforms show normal direction of venous flow, normal respiratory plasticity and response to augmentation. Limited views of the contralateral common femoral vein are unremarkable. OTHER No evidence of superficial thrombophlebitis or abnormal fluid collection. Limitations: Patient body habitus IMPRESSION: No evidence of femoropopliteal DVT or superficial thrombophlebitis within either lower extremity. Roanna Banning, MD [facility] Electronically Signed   By: Roanna Banning M.D.   On: 07/29/2022 17:04   DG Chest 2 View  Result Date: 07/29/2022 CLINICAL DATA:  SOB EXAM: CHEST - 2 VIEW COMPARISON:  Lower extremity DVT ultrasound, concurrent. Chest XR, 01/08/2022. CTA chest 01/02/2022. FINDINGS: Enlargement of the cardiac silhouette. Hypoinflation. No focal consolidation or mass. No pleural effusion or pneumothorax. No acute displaced fracture. IMPRESSION: Cardiomegaly and hypoinflation. No acute superimposed cardiopulmonary process. Electronically Signed   By: Roanna Banning M.D.   On: 07/29/2022 16:49    EKG: Independently reviewed.  Atria fibrillation, rate 145, previously prolonged 504.  LAFB.  No significant T wave abnormality from prior.  Assessment/Plan Principal Problem:   Atrial fibrillation with RVR (HCC) Active Problems:   Acute congestive heart failure (HCC)    Essential hypertension   Status epilepticus (HCC)   Alcohol withdrawal seizure with complication (HCC)   Hypokalemia    Assessment and Plan: * Atrial fibrillation with RVR (HCC) Presenting with difficulty breathing, bilateral lower extremity swelling, appears to be new onset CHF.  Heart rates up to 150s, no palpitations no chest pain.  No cardiac history.  CHADS2Vasc score at least 2 for hypertension history and new CHF.  Denies history of frequent falls, or GI bleed. - trop 7, trend -Check TSH-normal 1.5. - Mag- 1.6 -Recent echo 12/2021 EF of 55 to 60%, -Cardizem 10 mg given, currently on Cardizem drip -Start IV heparin, risk and benefit of anticoagulation explained to patient  Acute  congestive heart failure (HCC) With 2+ bilateral lower extremity pitting edema, abdominal bloating.  In setting of atrial fibrillation with RVR.  Reports baseline weight of about 265 pounds, weighing 285 pounds today.  Checks confirm weight gain.  BNP elevated at 540.  Last echo 12/2021, EF 55 to 60%, LV diastolic function undetermined -IV Lasix 80 mg x 1 given in ED, continue 40 twice daily -Input output, daily weights, daily BMP -Obtain limited updated echocardiogram -BiIateral venous Dopplers negative for DVT -He appears quite dyspneic with obvious increased work of breathing, clear lungs on auscultation, but he is not hypoxic, CTA chest obtained to rule out other etiology-due to for PE, showed small right pleural effusion and mild cardiomegaly.  Aneurysm of ascending aorta (HCC) CT ED chest today-  Stable dilatation of the ascending aorta measuring 4.7 cm. Recommend semi-annual imaging followup by CTA or MRA and referral to cardiothoracic surgery if not already obtained. -Patient follows routinely with his vascular surgeon.  Hypokalemia Potassium 3.2.  QTc prolonged at 504. - Mag low 1.6.  Replete -Replete K -History of alcohol abuse  Alcohol withdrawal seizure with complication (HCC) Reports no  alcoholic beverage in the past 2 weeks due to SOB, but previously drank 3-4 beers twice a week.  Potassium 3.2, magnesium 1.6. -Check Phos -Replete lytes - CIWA as needed - Thimane, folate, multivitamins  Essential hypertension Stable. -Hold losartan/hydrochlorothiazide, Norvasc to allow for titration of rate limiting medications   DVT prophylaxis: Heparin Code Status:  FULL code Family Communication: None at bedside Disposition Plan: ~ 2 days Consults called: None  Admission status: Inpt stepdown I certify that at the point of admission it is my clinical judgment that the patient will require inpatient hospital care spanning beyond 2 midnights from the point of admission due to high intensity of service, high risk for further deterioration and high frequency of surveillance required.    Author: Onnie Boer, MD 07/29/2022 9:43 PM  For on call review [email or web address].

## 2022-07-29 NOTE — Assessment & Plan Note (Addendum)
Stable. -Holding losartan/hydrochlorothiazide, Norvasc to allow for titration of rate limiting medications

## 2022-07-29 NOTE — Assessment & Plan Note (Addendum)
Presenting with difficulty breathing, bilateral lower extremity swelling, appears to be new onset CHF.  Heart rates up to 150s, no palpitations no chest pain.  No cardiac history.  CHADS2Vasc score.  Denies history of frequent falls, or GI bleed. -Check TSH-normal 1.5. - Mg and K being repleted, follow -weaned off cardizem infusion -transitioned IV heparin to oral apixaban, risks and benefit of anticoagulation explained to patient, who verbalized understanding

## 2022-07-30 ENCOUNTER — Other Ambulatory Visit (HOSPITAL_COMMUNITY): Payer: Self-pay

## 2022-07-30 ENCOUNTER — Inpatient Hospital Stay (HOSPITAL_COMMUNITY): Payer: Medicare HMO

## 2022-07-30 ENCOUNTER — Other Ambulatory Visit (HOSPITAL_COMMUNITY): Payer: Self-pay | Admitting: *Deleted

## 2022-07-30 DIAGNOSIS — I4891 Unspecified atrial fibrillation: Secondary | ICD-10-CM | POA: Diagnosis not present

## 2022-07-30 DIAGNOSIS — F1991 Other psychoactive substance use, unspecified, in remission: Secondary | ICD-10-CM | POA: Diagnosis present

## 2022-07-30 DIAGNOSIS — I4819 Other persistent atrial fibrillation: Secondary | ICD-10-CM

## 2022-07-30 DIAGNOSIS — I5031 Acute diastolic (congestive) heart failure: Secondary | ICD-10-CM

## 2022-07-30 DIAGNOSIS — I509 Heart failure, unspecified: Secondary | ICD-10-CM

## 2022-07-30 LAB — CBC
HCT: 32.4 % — ABNORMAL LOW (ref 39.0–52.0)
Hemoglobin: 9.8 g/dL — ABNORMAL LOW (ref 13.0–17.0)
MCH: 24.7 pg — ABNORMAL LOW (ref 26.0–34.0)
MCHC: 30.2 g/dL (ref 30.0–36.0)
MCV: 81.8 fL (ref 80.0–100.0)
Platelets: 221 10*3/uL (ref 150–400)
RBC: 3.96 MIL/uL — ABNORMAL LOW (ref 4.22–5.81)
RDW: 15.5 % (ref 11.5–15.5)
WBC: 8.3 10*3/uL (ref 4.0–10.5)
nRBC: 0 % (ref 0.0–0.2)

## 2022-07-30 LAB — BASIC METABOLIC PANEL
Anion gap: 8 (ref 5–15)
BUN: 16 mg/dL (ref 6–20)
CO2: 32 mmol/L (ref 22–32)
Calcium: 8.5 mg/dL — ABNORMAL LOW (ref 8.9–10.3)
Chloride: 95 mmol/L — ABNORMAL LOW (ref 98–111)
Creatinine, Ser: 0.74 mg/dL (ref 0.61–1.24)
GFR, Estimated: >60 mL/min (ref >60)
Glucose, Bld: 95 mg/dL (ref 70–99)
Potassium: 3.6 mmol/L (ref 3.5–5.1)
Sodium: 135 mmol/L (ref 135–145)

## 2022-07-30 LAB — RAPID URINE DRUG SCREEN, HOSP PERFORMED
Amphetamines: NOT DETECTED
Barbiturates: NOT DETECTED
Benzodiazepines: NOT DETECTED
Cocaine: POSITIVE — AB
Opiates: NOT DETECTED
Tetrahydrocannabinol: NOT DETECTED

## 2022-07-30 LAB — ECHOCARDIOGRAM LIMITED
Height: 75 in
S' Lateral: 4.3 cm
Weight: 4433.89 oz

## 2022-07-30 LAB — FOLATE: Folate: 10.8 ng/mL (ref >5.9)

## 2022-07-30 LAB — HEPARIN LEVEL (UNFRACTIONATED)
Heparin Unfractionated: 0.19 IU/mL — ABNORMAL LOW (ref 0.30–0.70)
Heparin Unfractionated: 0.28 IU/mL — ABNORMAL LOW (ref 0.30–0.70)
Heparin Unfractionated: 0.29 IU/mL — ABNORMAL LOW (ref 0.30–0.70)

## 2022-07-30 LAB — RETICULOCYTES
Immature Retic Fract: 20.3 % — ABNORMAL HIGH (ref 2.3–15.9)
RBC.: 4.04 MIL/uL — ABNORMAL LOW (ref 4.22–5.81)
Retic Count, Absolute: 67.9 10*3/uL (ref 19.0–186.0)
Retic Ct Pct: 1.7 % (ref 0.4–3.1)

## 2022-07-30 LAB — IRON AND TIBC
Iron: 38 ug/dL — ABNORMAL LOW (ref 45–182)
Saturation Ratios: 10 % — ABNORMAL LOW (ref 17.9–39.5)
TIBC: 373 ug/dL (ref 250–450)
UIBC: 335 ug/dL

## 2022-07-30 LAB — FERRITIN: Ferritin: 37 ng/mL (ref 24–336)

## 2022-07-30 LAB — VITAMIN B12: Vitamin B-12: 172 pg/mL — ABNORMAL LOW (ref 180–914)

## 2022-07-30 LAB — MAGNESIUM: Magnesium: 2 mg/dL (ref 1.7–2.4)

## 2022-07-30 MED ORDER — DIPHENHYDRAMINE HCL 25 MG PO CAPS
25.0000 mg | ORAL_CAPSULE | Freq: Four times a day (QID) | ORAL | Status: DC | PRN
  Administered 2022-07-30 – 2022-08-01 (×3): 25 mg via ORAL
  Filled 2022-07-30 (×3): qty 1

## 2022-07-30 MED ORDER — HEPARIN BOLUS VIA INFUSION
2000.0000 [IU] | Freq: Once | INTRAVENOUS | Status: AC
  Administered 2022-07-30: 2000 [IU] via INTRAVENOUS
  Filled 2022-07-30: qty 2000

## 2022-07-30 MED ORDER — PERFLUTREN LIPID MICROSPHERE
1.0000 mL | INTRAVENOUS | Status: AC | PRN
  Administered 2022-07-30: 3 mL via INTRAVENOUS

## 2022-07-30 MED ORDER — METOPROLOL SUCCINATE ER 50 MG PO TB24
50.0000 mg | ORAL_TABLET | Freq: Every day | ORAL | Status: DC
  Administered 2022-07-30: 50 mg via ORAL
  Filled 2022-07-30 (×2): qty 1

## 2022-07-30 NOTE — TOC CM/SW Note (Signed)
Transition of Care [facility]) - Inpatient Brief Assessment   Patient Details  Name: Andrew Haley MRN: [id number] Date of Birth: 04-21-1965  Transition of [facility]) CM/SW Contact:    Karn Cassis, LCSW Phone Number: 07/30/2022, 8:57 AM   Clinical Narrative: Pt admitted due to atrial fibrillation with RVR. Pt reports he lives alone and is independent with ADLs.  No needs reported at this time. TOC will follow.     Transition of Care Asessment: Insurance and Status: Insurance coverage has been reviewed Patient has primary care physician: Yes Home environment has been reviewed: Lives alone. Prior level of function:: Independent Prior/Current Home Services: No current home services Social Determinants of Health Reivew: SDOH reviewed no interventions necessary Readmission risk has been reviewed: Yes Transition of care needs: no transition of care needs at this time

## 2022-07-30 NOTE — Hospital Course (Signed)
57 y.o. male with medical history significant for hypertension, prostate cancer, status epilepticus.  Patient presented to the ED with complaints of bilateral lower extremity swelling over the past 2 months, at baseline he reports some swelling to his left lower extremity only.  Reports abdominal bloating, and weight gain.  Reports baseline weight is about 265 pounds. Over the past 3 to 4 days he has had increasing difficulty breathing.  Reports, he was rowing a boat over the weekends- 3 days ago,- about 5 miles stretch, really exerted himself, and since then he has not been able to catch his breath.  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  No difficulty breathing.  Never smoked cigarettes.  No chest pain.  No palpitation.  No cardiac history.   ED Course: Tmax 98.7.  Heart rate 109-154.  Respiratory rate 14-24.  Blood pressure systolic 1 17-1 36.  O2 sats 92 to 98% on room air.  BNP elevated at 540.  WBC 10.5.  Potassium 3.2.  Chest x-ray negative for acute abnormality.  Bilateral lower extremity venous Dopplers negative for DVT.  EKG shows atrial fibrillation with rate 145.  Patient was started on Cardizem, 10 mg bolus and drip.  With improvement in heart rate.  IV Lasix 80 mg x 1 given.

## 2022-07-30 NOTE — Progress Notes (Signed)
ANTICOAGULATION CONSULT NOTE  Pharmacy Consult for heparin Indication: atrial fibrillation  No Known Allergies  Patient Measurements: Height: 6\' 3"  (190.5 cm) Weight: 125.7 kg (277 lb 1.9 oz) IBW/kg (Calculated) : 84.5 Heparin Dosing Weight: 111 kg  Vital Signs: Temp: 97.6 F (36.4 C) (06/19 0436) Temp Source: Oral (06/19 0436) BP: 131/91 (06/19 0500) Pulse Rate: 90 (06/19 0500)  Labs: Recent Labs    07/29/22 1557 07/29/22 2206 07/30/22 0427  HGB 10.6*  --  9.8*  HCT 34.6*  --  32.4*  PLT 261  --  221  HEPARINUNFRC  --   --  0.19*  CREATININE 0.85  --  0.74  TROPONINIHS 7 8  --      Estimated Creatinine Clearance: 143.8 mL/min (by C-G formula based on SCr of 0.74 mg/dL).  Assessment: 57 y.o. male with new onset afib on IV heparin. Heparin level still low at 0.28 after rate adjustment this morning. Likely transition to po doac once echo is complete and read. No bleeding or IV issues noted.   DOAC copays are $45.   Goal of Therapy:  Heparin level 0.3-0.7 units/ml Monitor platelets by anticoagulation protocol: Yes   Plan:  Increase heparin to 2050 units/hr Recheck heparin level tonight  Sheppard Coil PharmD., BCPS Clinical Pharmacist 07/30/2022 7:47 AM

## 2022-07-30 NOTE — Progress Notes (Addendum)
2000 patient alert x4 on room air, heparin driprunning as ordered no pain noted call light in reach 2030 Complete ADL care done bath given patient bed full urine. Patient stand and pivot to chair then to toilet  unable to have BM just gas. Stand and pivot back to chair patient wants to stay in chair at this time 2216 hypertensive gaging with nausea tremors, itching and sweating PRN ativan given as ordered. Patient states he only does cocaine no other drugs and doesn't drink alcohol.  2315 Cardizem restarted for HR goals 0525 25 beat V-run while sleeping

## 2022-07-30 NOTE — Progress Notes (Signed)
*  PRELIMINARY RESULTS* Echocardiogram Limited 2-D Echocardiogram  has been performed with Definity.  Stacey Drain 07/30/2022, 2:47 PM

## 2022-07-30 NOTE — Assessment & Plan Note (Addendum)
--  urine toxicology screen positive for cocaine use --consulted TOC to provide outpatient treatment resources --pt was STRONGLY ADVISED against using further recreational drugs

## 2022-07-30 NOTE — Progress Notes (Signed)
PROGRESS NOTE   Andrew Haley  FAO:[id number] DOB: [birth year] DOA: 07/29/2022 PCP: Elfredia Nevins, MD   Chief Complaint  Patient presents with   Leg Swelling   Atrial Fibrillation   Level of care: Stepdown  Brief Admission History:  57 y.o. male with medical history significant for hypertension, prostate cancer, status epilepticus.  Patient presented to the ED with complaints of bilateral lower extremity swelling over the past 2 months, at baseline he reports some swelling to his left lower extremity only.  Reports abdominal bloating, and weight gain.  Reports baseline weight is about 265 pounds. Over the past 3 to 4 days he has had increasing difficulty breathing.  Reports, he was rowing a boat over the weekends- 3 days ago,- about 5 miles stretch, really exerted himself, and since then he has not been able to catch his breath.  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  No difficulty breathing.  Never smoked cigarettes.  No chest pain.  No palpitation.  No cardiac history.   ED Course: Tmax 98.7.  Heart rate 109-154.  Respiratory rate 14-24.  Blood pressure systolic 1 17-1 36.  O2 sats 92 to 98% on room air.  BNP elevated at 540.  WBC 10.5.  Potassium 3.2.  Chest x-ray negative for acute abnormality.  Bilateral lower extremity venous Dopplers negative for DVT.  EKG shows atrial fibrillation with rate 145.  Patient was started on Cardizem, 10 mg bolus and drip.  With improvement in heart rate.  IV Lasix 80 mg x 1 given.     Assessment and Plan: * Atrial fibrillation with RVR Presenting with difficulty breathing, bilateral lower extremity swelling, appears to be new onset CHF.  Heart rates up to 150s, no palpitations no chest pain.  No cardiac history.  CHADS2Vasc score.  Denies history of frequent falls, or GI bleed. - trop 7, trend -Check TSH-normal 1.5. - Mag- 1.6, supplemented up to 2.0 now, recheck in AM   -Recent echo 12/2021 EF of 55 to 60%, -Continue on Cardizem drip -continue IV heparin, risk and benefit of anticoagulation explained to patient  History of recreational drug use --urine toxicology screen pending  Aneurysm of ascending aorta (HCC) CT ED chest today-  Stable dilatation of the ascending aorta measuring 4.7 cm. Recommend semi-annual imaging followup by CTA or MRA and referral to cardiothoracic surgery if not already obtained. -Patient follows routinely with his vascular surgeon.  Acute HFpEF With 2+ bilateral lower extremity pitting edema, abdominal bloating.  In setting of atrial fibrillation with RVR.  Reports baseline weight of about 265 pounds, weighing 285 pounds today.  Checks confirm weight gain.  BNP elevated at 540.  Last echo 12/2021, EF 55 to 60%, LV diastolic function undetermined -IV Lasix 80 mg x 1 given in ED, continue 40 twice daily -Input output, daily weights, daily BMP -Obtain limited updated echocardiogram -BiIateral venous Dopplers negative for DVT -He appears quite dyspneic with obvious increased work of breathing, clear lungs on auscultation, but he is not hypoxic, CTA chest obtained to rule out other etiology-due to for PE, showed small right pleural effusion and mild cardiomegaly.  Intake/Output Summary (Last 24 hours) at 07/30/2022 1223 Last data filed at 07/30/2022 0813 Gross per 24 hour  Intake 578.71 ml  Output 2550 ml  Net -1971.29 ml   Filed Weights   07/29/22 1535 07/29/22 2100 07/30/22 0436  Weight: 129.3 kg 125.2 kg 125.7  kg     Hypokalemia Potassium 3.2.  QTc prolonged at 504. - Mag low 1.6.  Replete -Replete K -History of alcohol abuse  History of Alcohol withdrawal seizure with complication Reports no alcoholic beverage in the past 2 weeks due to SOB, but previously drank 3-4 beers twice a week.  Potassium 3.2, magnesium 1.6. -Check Phos -Replete lytes - CIWA as needed - Thimane, folate, multivitamins  Essential  hypertension Stable. -Hold losartan/hydrochlorothiazide, Norvasc to allow for titration of rate limiting medications   DVT prophylaxis: IV heparin infusion  Code Status: Full  Family Communication:  Disposition: Status is: Inpatient Remains inpatient appropriate because: intensity   Consultants:  cardiology Procedures:  TTE pending  Antimicrobials:  N/a   Subjective: Pt reports he is urinating frequently on IV furosemide   Objective: Vitals:   07/30/22 0436 07/30/22 0500 07/30/22 0800 07/30/22 0811  BP:  (!) 131/91 (!) 134/93   Pulse:  90 91 (!) 102  Resp:  16 17 16   Temp: 97.6 F (36.4 C)   97.8 F (36.6 C)  TempSrc: Oral   Oral  SpO2:  95% 98% 100%  Weight: 125.7 kg     Height:        Intake/Output Summary (Last 24 hours) at 07/30/2022 1227 Last data filed at 07/30/2022 0813 Gross per 24 hour  Intake 578.71 ml  Output 2550 ml  Net -1971.29 ml   Filed Weights   07/29/22 1535 07/29/22 2100 07/30/22 0436  Weight: 129.3 kg 125.2 kg 125.7 kg   Examination:  General exam: Appears calm and comfortable  Respiratory system: Clear to auscultation. Respiratory effort normal. Cardiovascular system: normal S1 & S2 heard. No JVD, murmurs, rubs, gallops or clicks. No pedal edema. Gastrointestinal system: Abdomen is nondistended, soft and nontender. No organomegaly or masses felt. Normal bowel sounds heard. Central nervous system: Alert and oriented. No focal neurological deficits. Extremities: Symmetric 5 x 5 power. Skin: No rashes, lesions or ulcers. Psychiatry: Judgement and insight appear normal. Mood & affect appropriate.   Data Reviewed: I have personally reviewed following labs and imaging studies  CBC: Recent Labs  Lab 07/29/22 1557 07/30/22 0427  WBC 10.5 8.3  HGB 10.6* 9.8*  HCT 34.6* 32.4*  MCV 81.2 81.8  PLT 261 221    Basic Metabolic Panel: Recent Labs  Lab 07/29/22 1557 07/29/22 2206 07/30/22 0427  NA 134*  --  135  K 3.2*  --  3.6  CL 96*   --  95*  CO2 29  --  32  GLUCOSE 175*  --  95  BUN 16  --  16  CREATININE 0.85  --  0.74  CALCIUM 8.7*  --  8.5*  MG 1.6*  --  2.0  PHOS  --  4.5  --     CBG: No results for input(s): "GLUCAP" in the last 168 hours.  Recent Results (from the past 240 hour(s))  MRSA Next Gen by PCR, Nasal     Status: None   Collection Time: 07/29/22  8:52 PM   Specimen: Nasal Mucosa; Nasal Swab  Result Value Ref Range Status   MRSA by PCR Next Gen NOT DETECTED NOT DETECTED Final    Comment: (NOTE) The GeneXpert MRSA Assay (FDA approved for NASAL specimens only), is one component of a comprehensive MRSA colonization surveillance program. It is not intended to diagnose MRSA infection nor to guide or monitor treatment for MRSA infections. Test performance is not FDA approved in patients less than 51 years old.  Performed at [facility], [address], [place], [place] [address]      Radiology Studies: CT Angio Chest Pulmonary Embolism (PE) W or WO Contrast  Result Date: 07/29/2022 CLINICAL DATA:  Increased work of breathing. EXAM: CT ANGIOGRAPHY CHEST WITH CONTRAST TECHNIQUE: Multidetector CT imaging of the chest was performed using the standard protocol during bolus administration of intravenous contrast. Multiplanar CT image reconstructions and MIPs were obtained to evaluate the vascular anatomy. RADIATION DOSE REDUCTION: This exam was performed according to the departmental dose-optimization program which includes automated exposure control, adjustment of the mA and/or kV according to patient size and/or use of iterative reconstruction technique. CONTRAST:  80mL OMNIPAQUE IOHEXOL 350 MG/ML SOLN COMPARISON:  CT angiogram chest 01/02/2022 FINDINGS: Cardiovascular: Ascending aorta is dilated measuring 4.7 cm similar to the prior study. Heart is mildly enlarged. There is no pericardial effusion. There is adequate opacification of the pulmonary arteries to the segmental level. There is no evidence for  pulmonary embolism. Mediastinum/Nodes: Thyroid gland is mildly diffusely enlarged with numerous nodules measuring up to 15 mm, similar to prior. There are no enlarged mediastinal or hilar lymph nodes. The esophagus is within normal limits. Lungs/Pleura: There is a small right pleural effusion. There is atelectasis in the bilateral lower lobes. Lungs are otherwise clear. There is no pneumothorax. Upper Abdomen: No acute abnormality. Musculoskeletal: No chest wall abnormality. No acute or significant osseous findings. Review of the MIP images confirms the above findings. IMPRESSION: 1. No evidence for pulmonary embolism. 2. Small right pleural effusion. 3. Mild cardiomegaly. 4. Stable dilatation of the ascending aorta measuring 4.7 cm. Recommend semi-annual imaging followup by CTA or MRA and referral to cardiothoracic surgery if not already obtained. This recommendation follows 2010 ACCF/AHA/AATS/ACR/ASA/SCA/SCAI/SIR/STS/SVM Guidelines for the Diagnosis and Management of Patients With Thoracic Aortic Disease. Circulation. 2010; 121: U045-W09. Aortic aneurysm NOS (ICD10-I71.9) 5. Left Bosniak I benign renal cyst measuring 5.5 cm. No follow-up imaging is recommended. JACR 2018 Feb; 264-273, Management of the Incidental Renal Mass on CT, [employer] 2021; 814-848, Bosniak Classification of Cystic Renal Masses, Version 2019. Electronically Signed   By: Darliss Cheney M.D.   On: 07/29/2022 20:47   US Venous Img Lower Bilateral (DVT)  Result Date: 07/29/2022 CLINICAL DATA:  [id number] SOB (shortness of breath) [id number] EXAM: BILATERAL LOWER EXTREMITY VENOUS DOPPLER ULTRASOUND TECHNIQUE: Gray-scale sonography with graded compression, as well as color Doppler and duplex ultrasound were performed to evaluate the lower extremity deep venous systems from the level of the common femoral vein and including the common femoral, femoral, profunda femoral, popliteal and calf veins including the posterior tibial, peroneal and  gastrocnemius veins when visible. The superficial great saphenous vein was also interrogated. Spectral Doppler was utilized to evaluate flow at rest and with distal augmentation maneuvers in the common femoral, femoral and popliteal veins. COMPARISON:  Chest XR, concurrent. FINDINGS: RIGHT LOWER EXTREMITY VENOUS Normal compressibility of the RIGHT common femoral, superficial femoral, and popliteal veins, as well as the visualized calf veins. Visualized portions of profunda femoral vein and great saphenous vein unremarkable. No filling defects to suggest DVT on grayscale or color Doppler imaging. Doppler waveforms show normal direction of venous flow, normal respiratory plasticity and response to augmentation. OTHER No evidence of superficial thrombophlebitis or abnormal fluid collection. Limitations: Patient body habitus LEFT LOWER EXTREMITY VENOUS Normal compressibility of the LEFT common femoral, superficial femoral, and popliteal veins, as well as the visualized calf veins. Visualized portions of profunda femoral vein and great saphenous vein unremarkable. No filling defects  to suggest DVT on grayscale or color Doppler imaging. Doppler waveforms show normal direction of venous flow, normal respiratory plasticity and response to augmentation. Limited views of the contralateral common femoral vein are unremarkable. OTHER No evidence of superficial thrombophlebitis or abnormal fluid collection. Limitations: Patient body habitus IMPRESSION: No evidence of femoropopliteal DVT or superficial thrombophlebitis within either lower extremity. Roanna Banning, MD [facility] Radiology Electronically Signed   By: Roanna Banning M.D.   On: 07/29/2022 17:04   DG Chest 2 View  Result Date: 07/29/2022 CLINICAL DATA:  SOB EXAM: CHEST - 2 VIEW COMPARISON:  Lower extremity DVT ultrasound, concurrent. Chest XR, 01/08/2022. CTA chest 01/02/2022. FINDINGS: Enlargement of the cardiac  silhouette. Hypoinflation. No focal consolidation or mass. No pleural effusion or pneumothorax. No acute displaced fracture. IMPRESSION: Cardiomegaly and hypoinflation. No acute superimposed cardiopulmonary process. Electronically Signed   By: Roanna Banning M.D.   On: 07/29/2022 16:49    Scheduled Meds:  Chlorhexidine Gluconate Cloth  6 each Topical Daily   folic acid  1 mg Oral Daily   furosemide  40 mg Intravenous Q12H   multivitamin with minerals  1 tablet Oral Daily   pneumococcal 20-valent conjugate vaccine  0.5 mL Intramuscular Tomorrow-1000   thiamine  100 mg Oral Daily   Or   thiamine  100 mg Intravenous Daily   Continuous Infusions:  diltiazem (CARDIZEM) infusion 5 mg/hr (07/30/22 0813)   heparin 1,900 Units/hr (07/30/22 0905)    LOS: 1 day   Critical Care Procedure Note Authorized and Performed by: Maryln Manuel MD  Total Critical Care time:  47 mins Due to a high probability of clinically significant, life threatening deterioration, the patient required my highest level of preparedness to intervene emergently and I personally spent this critical care time directly and personally managing the patient.  This critical care time included obtaining a history; examining the patient, pulse oximetry; ordering and review of studies; arranging urgent treatment with development of a management plan; evaluation of patient's response of treatment; frequent reassessment; and discussions with other providers.  This critical care time was performed to assess and manage the high probability of imminent and life threatening deterioration that could result in multi-organ failure.  It was exclusive of separately billable procedures and treating other patients and teaching time.    Standley Dakins, MD How to contact the [facility] Attending or Consulting provider 7A - 7P or covering provider during after hours 7P -7A, for this patient?  Check the care team in [facility] and look for a) attending/consulting TR[facility]  provider listed and b) the [facility] team listed Log into [email or web address] and use 's universal password to access. If you do not have the password, please contact the hospital operator. Locate the [facility] provider you are looking for under [facility] and page to a number that you can be directly reached. If you still have difficulty reaching the provider, please page the [facility] (Director on Call) for the Hospitalists listed on amion for assistance.  07/30/2022, 12:27 PM

## 2022-07-30 NOTE — Plan of Care (Signed)
  Problem: Education: Goal: Knowledge of General Education information will improve Description: Including pain rating scale, medication(s)/side effects and non-pharmacologic comfort measures Outcome: Progressing   Problem: Health Behavior/Discharge Planning: Goal: Ability to manage health-related needs will improve Outcome: Progressing   Problem: Clinical Measurements: Goal: Ability to maintain clinical measurements within normal limits will improve Outcome: Progressing Goal: Will remain free from infection Outcome: Progressing Goal: Diagnostic test results will improve Outcome: Progressing Goal: Respiratory complications will improve Outcome: Progressing Goal: Cardiovascular complication will be avoided Outcome: Progressing   Problem: Activity: Goal: Risk for activity intolerance will decrease Outcome: Progressing   Problem: Nutrition: Goal: Adequate nutrition will be maintained Outcome: Progressing   Problem: Coping: Goal: Level of anxiety will decrease Outcome: Progressing   Problem: Elimination: Goal: Will not experience complications related to bowel motility Outcome: Progressing Goal: Will not experience complications related to urinary retention Outcome: Progressing   Problem: Pain Managment: Goal: General experience of comfort will improve Outcome: Progressing   Problem: Safety: Goal: Ability to remain free from injury will improve Outcome: Not Progressing Note: Patient gate unsteady needs assistance can only pivot and stand not walk   Problem: Skin Integrity: Goal: Risk for impaired skin integrity will decrease Outcome: Not Progressing Note: R/t edema and limited mobility patient also urinating on self and sitting in it    Problem: Education: Goal: Ability to demonstrate management of disease process will improve Outcome: Not Progressing Goal: Ability to verbalize understanding of medication therapies will improve Outcome: Not Progressing Goal:  Individualized Educational Video(s) Outcome: Not Progressing Note: Patient still in afib with rvr with activity   Problem: Activity: Goal: Capacity to carry out activities will improve Outcome: Progressing   Problem: Cardiac: Goal: Ability to achieve and maintain adequate cardiopulmonary perfusion will improve Outcome: Progressing   Problem: Education: Goal: Knowledge of disease or condition will improve Outcome: Not Progressing Goal: Understanding of medication regimen will improve Outcome: Not Progressing Goal: Individualized Educational Video(s) Outcome: Not Progressing Note: In afib rvr with activity   Problem: Activity: Goal: Ability to tolerate increased activity will improve Outcome: Progressing   Problem: Cardiac: Goal: Ability to achieve and maintain adequate cardiopulmonary perfusion will improve Outcome: Not Progressing   Problem: Health Behavior/Discharge Planning: Goal: Ability to safely manage health-related needs after discharge will improve Outcome: Progressing

## 2022-07-30 NOTE — TOC Benefit Eligibility Note (Signed)
Pharmacy Patient Advocate Encounter  Insurance verification completed.    The patient is insured through [facility]    Ran test claim for Eliquis and the current 30 day co-pay is $45.00.  Ran test claim for Xarelto and the current 30 day co-pay is $45.00.  This test claim was processed through [facility]- copay amounts may vary at other pharmacies due to pharmacy/plan contracts, or as the patient moves through the different stages of their insurance plan.

## 2022-07-30 NOTE — Plan of Care (Signed)
  Problem: Education: Goal: Knowledge of General Education information will improve Description Including pain rating scale, medication(s)/side effects and non-pharmacologic comfort measures Outcome: Progressing   

## 2022-07-30 NOTE — Progress Notes (Signed)
   Progress Note  Patient Name: Andrew Haley Date of Encounter: 07/30/2022  Echocardiogram shows LVEF 30 to 35% range with global hypokinesis, no LV mural thrombus identified..  At this point would suspect a tachycardia induced cardiomyopathy and longer standing atrial fibrillation than recent diagnosis.  Plan to start Toprol-XL 50 mg daily and wean off Cardizem if possible.  Can eventually transition from IV heparin to DOAC most likely.  Plan to focus on GDMT from the perspective of cardiomyopathy and ultimately a cardioversion although not necessarily in the acute setting presuming we can get his heart rate controlled.  Also need to follow-up on UDS.  For questions or updates, please contact [facility] Please consult [email or web address] for contact info under   Signed, Nona Dell, MD  07/30/2022, 3:50 PM

## 2022-07-30 NOTE — Progress Notes (Signed)
ANTICOAGULATION CONSULT NOTE  Pharmacy Consult for heparin Indication: atrial fibrillation Brief A/P: Heparin level subtherapeutic Increase Heparin rate  No Known Allergies  Patient Measurements: Height: 6\' 3"  (190.5 cm) Weight: 125.7 kg (277 lb 1.9 oz) IBW/kg (Calculated) : 84.5 Heparin Dosing Weight: 111 kg  Vital Signs: Temp: 97.6 F (36.4 C) (06/19 0436) Temp Source: Oral (06/19 0436) BP: 131/91 (06/19 0500) Pulse Rate: 90 (06/19 0500)  Labs: Recent Labs    07/29/22 1557 07/29/22 2206 07/30/22 0427  HGB 10.6*  --  9.8*  HCT 34.6*  --  32.4*  PLT 261  --  221  HEPARINUNFRC  --   --  0.19*  CREATININE 0.85  --   --   TROPONINIHS 7 8  --      Estimated Creatinine Clearance: 135.3 mL/min (by C-G formula based on SCr of 0.85 mg/dL).  Assessment: 57 y.o. male with Afib for heparin  Goal of Therapy:  Heparin level 0.3-0.7 units/ml Monitor platelets by anticoagulation protocol: Yes   Plan:  Heparin 2000 units IV bolus, then increase heparin 1900 units/hr Check heparin level in 6 hours.    Eddie Candle 07/30/2022,6:12 AM

## 2022-07-30 NOTE — Consult Note (Addendum)
Cardiology Consultation   Patient ID: [name] MRN: [id number]; DOB: 1965-06-13  Admit date: 07/29/2022 Date of Consult: 07/30/2022  PCP:  Elfredia Nevins, MD   [facility] Providers Cardiologist: [facility]  Patient Profile:   Andrew Haley is a 57 y.o. male with a hx of cocaine-induced seizures (occurring in 12/2021), HTN, HLD, OSA, thoracic aortic aneurysm and polysubstance abuse who is being seen 07/30/2022 for the evaluation of new-onset atrial fibrillation with RVR at the request of Dr. Laural Benes.  History of Present Illness:   Andrew Haley presented to [facility] ED on 07/29/2022 for evaluation of worsening lower extremity edema for the past 2 months and worsening dyspnea on exertion. Reported his PCP had recently diagnosed him with atrial fibrillation and was referred to Cardiology but was not on anticoagulation. In talking with the patient today, he reports having lower extremity edema for the past 2 months but he started to notice worsening abdominal distention and a 15 pound weight gain. Also reports dyspnea on exertion, orthopnea and PND. Reports his dyspnea acutely worsened after paddling a boat on the river for over 5 miles this weekend. Denies any associated chest pain or palpitations with this. Was also recently diagnosed with a hydrocele. Reports his PCP diagnosed with atrial fibrillation last Thursday but he was not started on anticoagulation at that time. He had been taking Amlodipine and this was discontinued as it was felt this could be causing his lower extremity edema. He is unaware of any personal prior cardiac history. Reports his mom had issues with her aortic valve but unaware of any history of cardiac arrhythmias. He reports he previously consumed a significant amount of beer but quit drinking approximately 2 months ago. Reports he also previously used cocaine but his last use was 4 weeks ago.  Initial labs show WBC 10.5, Hgb 10.6, platelets 261, Na+ 134, K+  3.2 and creatinine 0.85. BNP 540. Mg 1.6. TSH 1.547. Initial and repeat Hs Troponin negative at 7 and 8. CXR showing cardiomegaly and hypoinflation. Lower extremity dopplers negative for a DVT. CTA showing no evidence of a PE. Noted to have a small right pleural effusion and mild cardiomegaly along with stable dilatation of the ascending aorta at 4.7 cm. EKG showing atrial fibrillation with RVR, HR 145 with LAFB.   He received IV Lasix 80mg  while in the ED and has been started on 40mg  BID. Also on IV Cardizem. He has a recorded net output of -1.9 L thus far.   Past Medical History:  Diagnosis Date   Arthritis    Class 1 obesity 07/18/2020   Complication of anesthesia    slow to wake up   Essential hypertension 07/18/2020   Headache    Hypertension    Hypertriglyceridemia    Numbness of fingers of both hands    Prostate cancer (HCC) 2017   Sleep apnea    CPAP    Past Surgical History:  Procedure Laterality Date   ANKLE SURGERY Left    COLONOSCOPY WITH PROPOFOL N/A 09/28/2017   Procedure: COLONOSCOPY WITH PROPOFOL;  Surgeon: Corbin Ade, MD;  Location: [facility];  Service: Endoscopy;  Laterality: N/A;  12:00pm   HAND SURGERY Right 2003   cysts    JOINT REPLACEMENT     LESION REMOVAL Left 09/23/2013   Procedure: MINOR EXCISION 3 CM SKIN LESION OF LEFT THIGH ;  Surgeon: Dalia Heading, MD;  Location: [facility];  Service: General;  Laterality: Left;  LYMPHADENECTOMY Bilateral 11/29/2015   Procedure: LYMPHADENECTOMY;  Surgeon: Heloise Purpura, MD;  Location: [facility];  Service: Urology;  Laterality: Bilateral;   ROBOT ASSISTED LAPAROSCOPIC RADICAL PROSTATECTOMY N/A 11/29/2015   Procedure: XI ROBOTIC ASSISTED LAPAROSCOPIC RADICAL PROSTATECTOMY LEVEL 3;  Surgeon: Heloise Purpura, MD;  Location: [facility];  Service: Urology;  Laterality: N/A;   TOTAL KNEE ARTHROPLASTY Left 02/22/2014   Procedure: LEFT TOTAL KNEE ARTHROPLASTY;  Surgeon: Jacki Cones, MD;  Location: [facility];  Service:  Orthopedics;  Laterality: Left;   TOTAL KNEE ARTHROPLASTY Right 05/23/2014   Procedure: RIGHT TOTAL KNEE ARTHROPLASTY;  Surgeon: Ranee Gosselin, MD;  Location: [facility];  Service: Orthopedics;  Laterality: Right;   TOTAL KNEE REVISION Right 03/04/2021   Procedure: TOTAL KNEE REVISION;  Surgeon: Durene Romans, MD;  Location: [facility];  Service: Orthopedics;  Laterality: Right;     Home Medications:  Prior to Admission medications   Medication Sig Start Date End Date Taking? Authorizing Provider  furosemide (LASIX) 40 MG tablet Take 40 mg by mouth in the morning, at noon, in the evening, and at bedtime.   Yes [provider]  olmesartan (BENICAR) 40 MG tablet Take 40 mg by mouth daily. 05/21/22  Yes [provider]  Oxycodone HCl 10 MG TABS Take 10 mg by mouth every 4 (four) hours as needed (pain).   Yes [provider]  Vitamin D, Ergocalciferol, (DRISDOL) 1.25 MG (50000 UNIT) CAPS capsule Take 50,000 Units by mouth once a week. 05/21/22  Yes [provider]  amLODipine (NORVASC) 10 MG tablet Take 10 mg by mouth daily. Patient not taking: Reported on 07/30/2022    [provider]  diclofenac (VOLTAREN) 75 MG EC tablet Take 75 mg by mouth 2 (two) times daily. Patient not taking: Reported on 07/30/2022    [provider]  naloxone [facility]) nasal spray 4 mg/0.1 mL Spray into nostrils if patient shows signs of opioid overdose, then call 911 03/07/21   Andrew Anger, PA-C  traZODone (DESYREL) 100 MG tablet Take 100 mg by mouth at bedtime as needed for sleep. Patient not taking: Reported on 07/30/2022    [provider]    Inpatient Medications: Scheduled Meds:  Chlorhexidine Gluconate Cloth  6 each Topical Daily   folic acid  1 mg Oral Daily   furosemide  40 mg Intravenous Q12H   multivitamin with minerals  1 tablet Oral Daily   pneumococcal 20-valent conjugate vaccine  0.5 mL Intramuscular Tomorrow-1000   thiamine  100 mg Oral Daily    Or   thiamine  100 mg Intravenous Daily   Continuous Infusions:  diltiazem (CARDIZEM) infusion 5 mg/hr (07/30/22 0813)   heparin 1,900 Units/hr (07/30/22 0905)   PRN Meds: acetaminophen **OR** acetaminophen, diphenhydrAMINE, ipratropium-albuterol, LORazepam **OR** LORazepam, mouth rinse, polyethylene glycol  Allergies:   No Known Allergies  Social History:   Social History   Socioeconomic History   Marital status: Single    Spouse name: Not on file   Number of children: Not on file   Years of education: Not on file   Highest education level: Not on file  Occupational History   Occupation: disability  Tobacco Use   Smoking status: Never   Smokeless tobacco: Never  Vaping Use   Vaping Use: Never used  Substance and Sexual Activity   Alcohol use: Yes    Comment: 5-6 beers daily   Drug use: Not Currently    Types: Cocaine    Comment: 1 week ago  Sexual activity: Yes    Birth control/protection: None  Other Topics Concern   Not on file  Social History Narrative   Not on file   Social Determinants of Health   Financial Resource Strain: Not on file  Food Insecurity: No Food Insecurity (07/29/2022)   Hunger Vital Sign    Worried About Running Out of Food in the Last Year: Never true    Ran Out of Food in the Last Year: Never true  Transportation Needs: No Transportation Needs (07/29/2022)   PRAPARE - Administrator, Civil Service (Medical): No    Lack of Transportation (Non-Medical): No  Physical Activity: Not on file  Stress: Not on file  Social Connections: Not on file  Intimate Partner Violence: Not At Risk (07/29/2022)   Humiliation, Afraid, Rape, and Kick questionnaire    Fear of Current or Ex-Partner: No    Emotionally Abused: No    Physically Abused: No    Sexually Abused: No    Family History:    Family History  Problem Relation Age of Onset   Heart disease Mother    Throat cancer Father    Colon cancer Neg Hx    Colon polyps Neg Hx       ROS:  Please see the history of present illness.   All other ROS reviewed and negative.     Physical Exam/Data:   Vitals:   07/30/22 0436 07/30/22 0500 07/30/22 0800 07/30/22 0811  BP:  (!) 131/91 (!) 134/93   Pulse:  90 91 (!) 102  Resp:  16 17 16   Temp: 97.6 F (36.4 C)   97.8 F (36.6 C)  TempSrc: Oral   Oral  SpO2:  95% 98% 100%  Weight: 125.7 kg     Height:        Intake/Output Summary (Last 24 hours) at 07/30/2022 1004 Last data filed at 07/30/2022 0813 Gross per 24 hour  Intake 578.71 ml  Output 2550 ml  Net -1971.29 ml      07/30/2022    4:36 AM 07/29/2022    9:00 PM 07/29/2022    3:35 PM  Last 3 Weights  Weight (lbs) 277 lb 1.9 oz 276 lb 0.3 oz 285 lb  Weight (kg) 125.7 kg 125.2 kg 129.275 kg     Body mass index is 34.64 kg/m.  General:  Well nourished, well developed male appearing in no acute distress HEENT: normal Neck: no JVD Vascular: No carotid bruits; Distal pulses 2+ bilaterally Cardiac:  normal S1, S2; Irregularly irregular Lungs: decreased breath sounds along bases bilaterally.  Abd: soft, nontender, no hepatomegaly  Ext: 2+ pitting edema bilaterally Musculoskeletal:  No deformities, BUE and BLE strength normal and equal Skin: warm and dry; hives along lower extremities.   Neuro:  CNs 2-12 intact, no focal abnormalities noted Psych:  Normal affect   EKG:  The EKG was personally reviewed and demonstrates: Atrial fibrillation with RVR, HR 145 with LAFB.  Telemetry:  Telemetry was personally reviewed and demonstrates: Atrial fibrillation, HR in 80's to low-100's.   Relevant CV Studies:  Echocardiogram: 12/2021 IMPRESSIONS     1. Left ventricular ejection fraction, by estimation, is 55 to 60%. The  left ventricle has normal function. The left ventricle has no regional  wall motion abnormalities. There is mild concentric left ventricular  hypertrophy. Left ventricular diastolic  parameters are indeterminate.   2. Right ventricular  systolic function is normal. The right ventricular  size is normal. Tricuspid regurgitation signal is  inadequate for assessing  PA pressure.   3. The mitral valve is normal in structure. No evidence of mitral valve  regurgitation. No evidence of mitral stenosis.   4. The aortic valve was not well visualized. Aortic valve regurgitation  is not visualized. Aortic valve mean gradient measures 8.0 mmHg.   5. The inferior vena cava is normal in size with greater than 50%  respiratory variability, suggesting right atrial pressure of 3 mmHg.   Comparison(s): No prior Echocardiogram.   Laboratory Data:  High Sensitivity Troponin:   Recent Labs  Lab 07/29/22 1557 07/29/22 2206  TROPONINIHS 7 8     Chemistry Recent Labs  Lab 07/29/22 1557 07/30/22 0427  NA 134* 135  K 3.2* 3.6  CL 96* 95*  CO2 29 32  GLUCOSE 175* 95  BUN 16 16  CREATININE 0.85 0.74  CALCIUM 8.7* 8.5*  MG 1.6* 2.0  GFRNONAA >60 >60  ANIONGAP 9 8    No results for input(s): "PROT", "ALBUMIN", "AST", "ALT", "ALKPHOS", "BILITOT" in the last 168 hours. Lipids No results for input(s): "CHOL", "TRIG", "HDL", "LABVLDL", "LDLCALC", "CHOLHDL" in the last 168 hours.  Hematology Recent Labs  Lab 07/29/22 1557 07/30/22 0427  WBC 10.5 8.3  RBC 4.26 3.96*  HGB 10.6* 9.8*  HCT 34.6* 32.4*  MCV 81.2 81.8  MCH 24.9* 24.7*  MCHC 30.6 30.2  RDW 15.6* 15.5  PLT 261 221   Thyroid  Recent Labs  Lab 07/29/22 1557  TSH 1.547    BNP Recent Labs  Lab 07/29/22 1557  BNP 540.0*    DDimer No results for input(s): "DDIMER" in the last 168 hours.   Radiology/Studies:  CT Angio Chest Pulmonary Embolism (PE) W or WO Contrast  Result Date: 07/29/2022 CLINICAL DATA:  Increased work of breathing. EXAM: CT ANGIOGRAPHY CHEST WITH CONTRAST TECHNIQUE: Multidetector CT imaging of the chest was performed using the standard protocol during bolus administration of intravenous contrast. Multiplanar CT image reconstructions and MIPs  were obtained to evaluate the vascular anatomy. RADIATION DOSE REDUCTION: This exam was performed according to the departmental dose-optimization program which includes automated exposure control, adjustment of the mA and/or kV according to patient size and/or use of iterative reconstruction technique. CONTRAST:  80mL OMNIPAQUE IOHEXOL 350 MG/ML SOLN COMPARISON:  CT angiogram chest 01/02/2022 FINDINGS: Cardiovascular: Ascending aorta is dilated measuring 4.7 cm similar to the prior study. Heart is mildly enlarged. There is no pericardial effusion. There is adequate opacification of the pulmonary arteries to the segmental level. There is no evidence for pulmonary embolism. Mediastinum/Nodes: Thyroid gland is mildly diffusely enlarged with numerous nodules measuring up to 15 mm, similar to prior. There are no enlarged mediastinal or hilar lymph nodes. The esophagus is within normal limits. Lungs/Pleura: There is a small right pleural effusion. There is atelectasis in the bilateral lower lobes. Lungs are otherwise clear. There is no pneumothorax. Upper Abdomen: No acute abnormality. Musculoskeletal: No chest wall abnormality. No acute or significant osseous findings. Review of the MIP images confirms the above findings. IMPRESSION: 1. No evidence for pulmonary embolism. 2. Small right pleural effusion. 3. Mild cardiomegaly. 4. Stable dilatation of the ascending aorta measuring 4.7 cm. Recommend semi-annual imaging followup by CTA or MRA and referral to cardiothoracic surgery if not already obtained. This recommendation follows 2010 ACCF/AHA/AATS/ACR/ASA/SCA/SCAI/SIR/STS/SVM Guidelines for the Diagnosis and Management of Patients With Thoracic Aortic Disease. Circulation. 2010; 121: Z610-R60. Aortic aneurysm NOS (ICD10-I71.9) 5. Left Bosniak I benign renal cyst measuring 5.5 cm. No follow-up imaging is recommended.  JACR 2018 Feb; 264-273, Management of the Incidental Renal Mass on CT, [employer] 2021; 814-848,  Bosniak Classification of Cystic Renal Masses, Version 2019. Electronically Signed   By: Darliss Cheney M.D.   On: 07/29/2022 20:47   US Venous Img Lower Bilateral (DVT)  Result Date: 07/29/2022 CLINICAL DATA:  [id number] SOB (shortness of breath) [id number] EXAM: BILATERAL LOWER EXTREMITY VENOUS DOPPLER ULTRASOUND TECHNIQUE: Gray-scale sonography with graded compression, as well as color Doppler and duplex ultrasound were performed to evaluate the lower extremity deep venous systems from the level of the common femoral vein and including the common femoral, femoral, profunda femoral, popliteal and calf veins including the posterior tibial, peroneal and gastrocnemius veins when visible. The superficial great saphenous vein was also interrogated. Spectral Doppler was utilized to evaluate flow at rest and with distal augmentation maneuvers in the common femoral, femoral and popliteal veins. COMPARISON:  Chest XR, concurrent. FINDINGS: RIGHT LOWER EXTREMITY VENOUS Normal compressibility of the RIGHT common femoral, superficial femoral, and popliteal veins, as well as the visualized calf veins. Visualized portions of profunda femoral vein and great saphenous vein unremarkable. No filling defects to suggest DVT on grayscale or color Doppler imaging. Doppler waveforms show normal direction of venous flow, normal respiratory plasticity and response to augmentation. OTHER No evidence of superficial thrombophlebitis or abnormal fluid collection. Limitations: Patient body habitus LEFT LOWER EXTREMITY VENOUS Normal compressibility of the LEFT common femoral, superficial femoral, and popliteal veins, as well as the visualized calf veins. Visualized portions of profunda femoral vein and great saphenous vein unremarkable. No filling defects to suggest DVT on grayscale or color Doppler imaging. Doppler waveforms show normal direction of venous flow, normal respiratory plasticity and response to augmentation. Limited views of the  contralateral common femoral vein are unremarkable. OTHER No evidence of superficial thrombophlebitis or abnormal fluid collection. Limitations: Patient body habitus IMPRESSION: No evidence of femoropopliteal DVT or superficial thrombophlebitis within either lower extremity. Roanna Banning, MD [facility] Electronically Signed   By: Roanna Banning M.D.   On: 07/29/2022 17:04   DG Chest 2 View  Result Date: 07/29/2022 CLINICAL DATA:  SOB EXAM: CHEST - 2 VIEW COMPARISON:  Lower extremity DVT ultrasound, concurrent. Chest XR, 01/08/2022. CTA chest 01/02/2022. FINDINGS: Enlargement of the cardiac silhouette. Hypoinflation. No focal consolidation or mass. No pleural effusion or pneumothorax. No acute displaced fracture. IMPRESSION: Cardiomegaly and hypoinflation. No acute superimposed cardiopulmonary process. Electronically Signed   By: Roanna Banning M.D.   On: 07/29/2022 16:49     Assessment and Plan:   1. Atrial Fibrillation with RVR -This was diagnosed by his PCP last week but anticipate this has likely been occurring for a longer timeframe given onset of symptoms 2+ months ago. He is currently on IV Cardizem at 5 mg/h and rates are well-controlled in the 80's to 90's. Will anticipate switching to PO Cardizem later today. If his EF is found to be reduced by echocardiogram, would switch to beta-blocker therapy. - This patients CHA2DS2-VASc Score and unadjusted Ischemic Stroke Rate (% per year) is equal to 0.6 % stroke rate/year from a score of 1 (HTN) and possibly higher pending additional work-up. While he may not require long-term anticoagulation, agree with this for now. He is currently on IV Heparin and would anticipate switching to a DOAC once it is determined he will not require further interventions this admission. - Given that his rates have improved, would anticipate a rate-control strategy for now and can arrange for a DCCV  following 3 weeks of  anticoagulation.  2. Acute HF (Echo pending to determine HFpEF/HFrEF) - Reports a 2+ month history of worsening respiratory issues along with abdominal distention and lower extremity edema. Also notes that his weight has increased by 15 pounds as his baseline is around 265 lbs and was up to 280 lbs on his home scales.  - Continue with IV Lasix 40 mg twice daily and follow I&O's along with daily weights. Repeat BMET in AM. He may require additional medication changes pending his echocardiogram results as he is at risk for a tachycardia-mediated cardiomyopathy.  3. HTN - BP has been variable this admission, at 134/93 on most recent check. He remains on IV Cardizem for now but anticipate switching to PO dosing as discussed above. He reports Amlodipine was discontinued by his PCP last week and he has remained on Olmesartan 40 mg daily. This is currently held to allow for titration of AV nodal blocking agents.  4. Thoracic Aortic Aneurysm - Followed by CT Surgery as an outpatient. Stable at 4.7 cm by imaging this admission with semiannual imaging recommended.  5.  Anemia - Hemoglobin was at 10.6 on admission and appears his baseline is around 12-13.  Additional workup per the admitting team.  6. History of Polysubstance Abuse - He reports previously consuming a significant amount of beer but quit drinking approximately 2 months ago. Also reports he previously used cocaine but his last use was 4 weeks ago. Will check a UDS.   7. Hives - On examination, he does have hives along his lower extremities. Unclear what this is related to as we would not anticipate Cardizem being the cause as he has been on Amlodipine for a long period of time without issues. He did receive contrast yesterday evening. Will order Benadryl and he may require a short dose of steroids if no improvement.   For questions or updates, please contact [facility] Please consult [email or web address] for contact info under     Signed, Ellsworth Lennox, PA-C  07/30/2022 10:04 AM   Attending note:  Patient seen and examined.  I reviewed his records and discussed the case with Ms. Patrick Jupiter, agree with her above findings.  Cardiology consulted for assistance with management of atrial fibrillation presenting with RVR.  Absolute duration is uncertain, reportedly diagnosed by PCP last week and referred to cardiology as an outpatient although not started on any new medications.  Patient reports leg swelling over the last few months and also shortness of breath, specifically no description of palpitations or chest pain however.  He has been more short of breath recently.  Has prior history of substance abuse including alcohol and cocaine, but reportedly none in the last month.  On examination he appears comfortable.  Does complain of itching with history of recurrent hives, presently on his legs.  Heart rate 90s in atrial fibrillation by telemetry.  Blood pressure 115/79.  Lungs are clear.  Cardiac exam with irregularly irregular rhythm and no gallop.  He has significant bilateral leg edema/lymphedema.  Pertinent lab work includes potassium 3.6, BUN 16, creatinine 0.74, normal high-sensitivity troponin I levels, BNP 540, hemoglobin 9.8, platelets 221, TSH 1.55.  Chest CTA today shows stable ascending thoracic aortic aneurysm at 4.7 cm (saw Dr. Laneta Simmers in January).  Also mild cardiomegaly and small right pleural effusion but no evidence of pulm embolus.  ECG shows atrial fibrillation with RVR, left anterior fascicular block, nonspecific T wave changes.  Persistent atrial fibrillation presenting with RVR,  CHA2DS2-VASc score is 1 at this point.  He is currently on IV heparin and IV diltiazem, heart rate control much better.  Will check UDS to be complete, patient denies any cocaine use in about a month.  Follow-up echocardiogram as well to assess LVEF and guide further treatment options.  Will make further recommendations  once more information is available.  Jonelle Sidle, M.D., F.A.C.C.

## 2022-07-31 DIAGNOSIS — I5020 Unspecified systolic (congestive) heart failure: Secondary | ICD-10-CM

## 2022-07-31 DIAGNOSIS — I4891 Unspecified atrial fibrillation: Secondary | ICD-10-CM | POA: Diagnosis not present

## 2022-07-31 DIAGNOSIS — R000 Tachycardia, unspecified: Secondary | ICD-10-CM

## 2022-07-31 DIAGNOSIS — F1490 Cocaine use, unspecified, uncomplicated: Secondary | ICD-10-CM

## 2022-07-31 DIAGNOSIS — I43 Cardiomyopathy in diseases classified elsewhere: Secondary | ICD-10-CM | POA: Diagnosis present

## 2022-07-31 DIAGNOSIS — F1991 Other psychoactive substance use, unspecified, in remission: Secondary | ICD-10-CM | POA: Diagnosis not present

## 2022-07-31 DIAGNOSIS — I10 Essential (primary) hypertension: Secondary | ICD-10-CM | POA: Diagnosis not present

## 2022-07-31 LAB — CBC
HCT: 35.5 % — ABNORMAL LOW (ref 39.0–52.0)
Hemoglobin: 10.7 g/dL — ABNORMAL LOW (ref 13.0–17.0)
MCH: 24.5 pg — ABNORMAL LOW (ref 26.0–34.0)
MCHC: 30.1 g/dL (ref 30.0–36.0)
MCV: 81.4 fL (ref 80.0–100.0)
Platelets: 244 10*3/uL (ref 150–400)
RBC: 4.36 MIL/uL (ref 4.22–5.81)
RDW: 15.3 % (ref 11.5–15.5)
WBC: 9.1 10*3/uL (ref 4.0–10.5)
nRBC: 0 % (ref 0.0–0.2)

## 2022-07-31 LAB — BASIC METABOLIC PANEL
Anion gap: 11 (ref 5–15)
BUN: 17 mg/dL (ref 6–20)
CO2: 31 mmol/L (ref 22–32)
Calcium: 8.6 mg/dL — ABNORMAL LOW (ref 8.9–10.3)
Chloride: 93 mmol/L — ABNORMAL LOW (ref 98–111)
Creatinine, Ser: 0.75 mg/dL (ref 0.61–1.24)
GFR, Estimated: >60 mL/min (ref >60)
Glucose, Bld: 107 mg/dL — ABNORMAL HIGH (ref 70–99)
Potassium: 3.3 mmol/L — ABNORMAL LOW (ref 3.5–5.1)
Sodium: 135 mmol/L (ref 135–145)

## 2022-07-31 LAB — HEPARIN LEVEL (UNFRACTIONATED): Heparin Unfractionated: 0.28 IU/mL — ABNORMAL LOW (ref 0.30–0.70)

## 2022-07-31 LAB — BRAIN NATRIURETIC PEPTIDE: B Natriuretic Peptide: 294 pg/mL — ABNORMAL HIGH (ref 0.0–100.0)

## 2022-07-31 LAB — MAGNESIUM: Magnesium: 1.8 mg/dL (ref 1.7–2.4)

## 2022-07-31 MED ORDER — METOPROLOL SUCCINATE ER 50 MG PO TB24
100.0000 mg | ORAL_TABLET | Freq: Every day | ORAL | Status: DC
  Administered 2022-07-31: 100 mg via ORAL
  Filled 2022-07-31: qty 2

## 2022-07-31 MED ORDER — MAGNESIUM SULFATE 2 GM/50ML IV SOLN
2.0000 g | Freq: Once | INTRAVENOUS | Status: AC
  Administered 2022-07-31: 2 g via INTRAVENOUS
  Filled 2022-07-31: qty 50

## 2022-07-31 MED ORDER — HEPARIN BOLUS VIA INFUSION
1650.0000 [IU] | Freq: Once | INTRAVENOUS | Status: AC
  Administered 2022-07-31: 1650 [IU] via INTRAVENOUS
  Filled 2022-07-31: qty 1650

## 2022-07-31 MED ORDER — POTASSIUM CHLORIDE CRYS ER 20 MEQ PO TBCR
40.0000 meq | EXTENDED_RELEASE_TABLET | Freq: Once | ORAL | Status: AC
  Administered 2022-07-31: 40 meq via ORAL
  Filled 2022-07-31: qty 2

## 2022-07-31 MED ORDER — APIXABAN 5 MG PO TABS
5.0000 mg | ORAL_TABLET | Freq: Two times a day (BID) | ORAL | Status: DC
  Administered 2022-07-31 – 2022-08-02 (×5): 5 mg via ORAL
  Filled 2022-07-31 (×5): qty 1

## 2022-07-31 MED ORDER — KETOROLAC TROMETHAMINE 30 MG/ML IJ SOLN
30.0000 mg | Freq: Four times a day (QID) | INTRAMUSCULAR | Status: DC | PRN
  Administered 2022-07-31 – 2022-08-02 (×3): 30 mg via INTRAVENOUS
  Filled 2022-07-31 (×3): qty 1

## 2022-07-31 NOTE — Assessment & Plan Note (Addendum)
--  appreciate cardiology team assistance --HR has been controlled well in last 36 hours --cardiology increased his toprol XL to 150 mg daily on 6/21 --He has been weaned off IV diltiazem   --follow up with cardiology office in 3-4 weeks --follow up with PCP in 1 week for recheck  --new prescriptions sent to pharmacy for patient

## 2022-07-31 NOTE — Progress Notes (Signed)
PROGRESS NOTE   Andrew Haley  ZOX:[id number] DOB: 26-Feb-1965 DOA: 07/29/2022 PCP: Elfredia Nevins, MD   Chief Complaint  Patient presents with   Leg Swelling   Atrial Fibrillation   Level of care: Stepdown  Brief Admission History:  57 y.o. male with medical history significant for hypertension, prostate cancer, status epilepticus.  Patient presented to the ED with complaints of bilateral lower extremity swelling over the past 2 months, at baseline he reports some swelling to his left lower extremity only.  Reports abdominal bloating, and weight gain.  Reports baseline weight is about 265 pounds. Over the past 3 to 4 days he has had increasing difficulty breathing.  Reports, he was rowing a boat over the weekends- 3 days ago,- about 5 miles stretch, really exerted himself, and since then he has not been able to catch his breath.  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  No difficulty breathing.  Never smoked cigarettes.  No chest pain.  No palpitation.  No cardiac history.   ED Course: Tmax 98.7.  Heart rate 109-154.  Respiratory rate 14-24.  Blood pressure systolic 1 17-1 36.  O2 sats 92 to 98% on room air.  BNP elevated at 540.  WBC 10.5.  Potassium 3.2.  Chest x-ray negative for acute abnormality.  Bilateral lower extremity venous Dopplers negative for DVT.  EKG shows atrial fibrillation with rate 145.  Patient was started on Cardizem, 10 mg bolus and drip.  With improvement in heart rate.  IV Lasix 80 mg x 1 given.     Assessment and Plan: * Atrial fibrillation with RVR Presenting with difficulty breathing, bilateral lower extremity swelling, appears to be new onset CHF.  Heart rates up to 150s, no palpitations no chest pain.  No cardiac history.  CHADS2Vasc score.  Denies history of frequent falls, or GI bleed. -Check TSH-normal 1.5. - Mg and K being repleted, recheck in AM  -Recent echo 12/2021 EF of  55 to 60%, -wean off cardizem drip when able  -transition IV heparin to oral apixaban, risks and benefit of anticoagulation explained to patient, who verbalized understanding   Tachycardia induced cardiomyopathy --appreciate cardiology team assistance --working on better HR control --cardiology increased his toprol XL to 100 mg daily on 6/20 --plan is to try to wean him off IV diltiazem as soon as able    Acute HFrEF With 2+ bilateral lower extremity pitting edema, abdominal bloating.  In setting of atrial fibrillation with RVR.  Reports baseline weight of about 265 pounds, weighing 285 pounds today.  Checks confirm weight gain.  BNP elevated at 540.  Echo 12/2021, EF 55 to 60%, LV diastolic function undetermined Repeat Echo 07/30/22: LVEF 30-35% global hypokinesis, no LV mural thrombus -IV Lasix 80 mg x 1 given in ED, continue 40 twice daily -Input output, daily weights, daily BMP -BiIateral venous Dopplers negative for DVT -He appears quite dyspneic with obvious increased work of breathing, clear lungs on auscultation, but he is not hypoxic, CTA chest obtained to rule out other etiology-due to for PE, showed small right pleural effusion and mild cardiomegaly. -started on toprol XL 6/19 by cardiology and increased to 100 mg daily 6/20  Intake/Output Summary (Last 24 hours) at 07/31/2022 1229 Last data filed at 07/31/2022 1052 Gross per 24 hour  Intake 594.42 ml  Output 1650 ml  Net -1055.58 ml   Filed Weights   07/29/22 2100 07/30/22  1610 07/31/22 0500  Weight: 125.2 kg 125.7 kg 125 kg     History of recreational drug use --urine toxicology screen positive for cocaine use --will consult TOC to provide outpatient treatment resources  Aneurysm of ascending aorta (HCC) CT ED chest today-  Stable dilatation of the ascending aorta measuring 4.7 cm. Recommend semi-annual imaging followup by CTA or MRA and referral to cardiothoracic surgery if not already obtained. -Patient follows  routinely with his vascular surgeon.  Hypokalemia -- working to replete Mg and K -- additional IV Mg and K given   History of Alcohol withdrawal seizure with complication Reports no alcoholic beverage in the past 2 weeks due to SOB, but previously drank 3-4 beers twice a week.  Potassium 3.2, magnesium 1.6. - Replete electrolytes - CIWA as needed - Thimane, folate, multivitamins  Essential hypertension Stable. -Hold losartan/hydrochlorothiazide, Norvasc to allow for titration of rate limiting medications   DVT prophylaxis: apixaban  Code Status: Full  Family Communication:  Disposition: Status is: Inpatient Remains inpatient appropriate because: intensity   Consultants:  cardiology Procedures:  TTE limited 07/30/22: LVEF 30-35% with global hypokinesis Antimicrobials:  N/a   Subjective: Pt denies having chest pain and denies SOB. Occasional palpitations.    Objective: Vitals:   07/31/22 0800 07/31/22 0900 07/31/22 1000 07/31/22 1143  BP: 112/66 (!) 143/87 111/83   Pulse: 91 89 95   Resp: 17 (!) 21 14   Temp:    98.2 F (36.8 C)  TempSrc:    Oral  SpO2: 99% 93% 96%   Weight:      Height:        Intake/Output Summary (Last 24 hours) at 07/31/2022 1235 Last data filed at 07/31/2022 1052 Gross per 24 hour  Intake 594.42 ml  Output 1650 ml  Net -1055.58 ml   Filed Weights   07/29/22 2100 07/30/22 0436 07/31/22 0500  Weight: 125.2 kg 125.7 kg 125 kg   Examination:  General exam: Appears calm and comfortable  Respiratory system: bibasilar crackles, no increased work of breathing. Cardiovascular system: normal S1 & S2 heard. No JVD, murmurs, rubs, gallops or clicks. No pedal edema. Gastrointestinal system: Abdomen is nondistended, soft and nontender. No organomegaly or masses felt. Normal bowel sounds heard. Central nervous system: Alert and oriented. No focal neurological deficits. Extremities: Symmetric 5 x 5 power. Skin: No rashes, lesions or  ulcers. Psychiatry: Judgement and insight appear normal. Mood & affect appropriate.   Data Reviewed: I have personally reviewed following labs and imaging studies  CBC: Recent Labs  Lab 07/29/22 1557 07/30/22 0427 07/31/22 0446  WBC 10.5 8.3 9.1  HGB 10.6* 9.8* 10.7*  HCT 34.6* 32.4* 35.5*  MCV 81.2 81.8 81.4  PLT 261 221 244    Basic Metabolic Panel: Recent Labs  Lab 07/29/22 1557 07/29/22 2206 07/30/22 0427 07/31/22 0446  NA 134*  --  135 135  K 3.2*  --  3.6 3.3*  CL 96*  --  95* 93*  CO2 29  --  32 31  GLUCOSE 175*  --  95 107*  BUN 16  --  16 17  CREATININE 0.85  --  0.74 0.75  CALCIUM 8.7*  --  8.5* 8.6*  MG 1.6*  --  2.0 1.8  PHOS  --  4.5  --   --     CBG: No results for input(s): "GLUCAP" in the last 168 hours.  Recent Results (from the past 240 hour(s))  MRSA Next Gen by PCR, Nasal  Status: None   Collection Time: 07/29/22  8:52 PM   Specimen: Nasal Mucosa; Nasal Swab  Result Value Ref Range Status   MRSA by PCR Next Gen NOT DETECTED NOT DETECTED Final    Comment: (NOTE) The GeneXpert MRSA Assay (FDA approved for NASAL specimens only), is one component of a comprehensive MRSA colonization surveillance program. It is not intended to diagnose MRSA infection nor to guide or monitor treatment for MRSA infections. Test performance is not FDA approved in patients less than 11 years old. Performed at [facility], [address] [place], [place] [address]      Radiology Studies: ECHOCARDIOGRAM LIMITED  Result Date: 07/30/2022    ECHOCARDIOGRAM LIMITED REPORT   Patient Name:   Andrew Haley Date of Exam: 07/30/2022 Medical Rec #:  [id number]    Height:       75.0 in Accession #:    [id number]   Weight:       277.1 lb Date of Birth:  Jun 20, 1965   BSA:          2.521 m Patient Age:    56 years     BP:           134/93 mmHg Patient Gender: M            HR:           102 bpm. [facility] Procedure: Cardiac Doppler, Color Doppler and Limited  Echo Indications:     Congestive Heart Failure I50.9  History:         Patient has prior history of Echocardiogram examinations, most                  recent 01/03/2022. Arrythmias:Atrial Fibrillation; Risk                  Factors:Hypertension. History of Alcohol withdrawal seizure                  with complication.  Sonographer:     Celesta Gentile RCS Referring Phys:  [id number] Heloise Beecham [facility] Diagnosing Phys: Nona Dell MD IMPRESSIONS  1. Limited study.  2. Left ventricular ejection fraction, by estimation, is 30 to 35%. The left ventricle has moderately decreased function. The left ventricle demonstrates global hypokinesis. The left ventricular internal cavity size was mildly dilated. There is mild concentric left ventricular hypertrophy. Left ventricular diastolic parameters are indeterminate.  3. No formed LV mural thrombus noted with Definity contrast.  4. RV-RA gradient 21 mmHg suggesting normal estimated RVSP in the setting of a normal CVP. Right ventricular systolic function is mildly reduced. The right ventricular size is normal.  5. Right atrial size was mildly dilated.  6. Aortic dilatation noted. There is mild dilatation of the aortic root, measuring 41 mm.  7. Unable to estimate CVP.  8. The aortic valve was not well visualized, likely trileaflet. There is mild calcification of the aortic valve. Aortic valve regurgitation is not visualized. Aortic valve sclerosis/calcification is present, without any evidence of aortic stenosis. Comparison(s): Prior images reviewed side by side. FINDINGS  Left Ventricle: Left ventricular ejection fraction, by estimation, is 30 to 35%. The left ventricle has moderately decreased function. The left ventricle demonstrates global hypokinesis. Definity contrast agent was given IV to delineate the left ventricular endocardial borders. The left ventricular internal cavity size was mildly dilated. There is mild concentric left ventricular hypertrophy. Left ventricular  diastolic parameters are indeterminate. Left ventricular diastolic function could not be evaluated  due to atrial fibrillation. Right Ventricle: RV-RA gradient 21 mmHg suggesting normal estimated RVSP in the setting of a normal CVP. The right ventricular size is normal. No increase in right ventricular wall thickness. Right ventricular systolic function is mildly reduced. Right Atrium: Right atrial size was mildly dilated. Pericardium: Trivial pericardial effusion is present. The pericardial effusion is posterior to the left ventricle. Tricuspid Valve: The tricuspid valve is grossly normal. Tricuspid valve regurgitation is mild. Aortic Valve: The aortic valve was not well visualized. There is mild calcification of the aortic valve. There is mild to moderate aortic valve annular calcification. Aortic valve regurgitation is not visualized. Aortic valve sclerosis/calcification is present, without any evidence of aortic stenosis. Pulmonic Valve: The pulmonic valve was grossly normal. Pulmonic valve regurgitation is trivial. Aorta: Aortic dilatation noted. There is mild dilatation of the aortic root, measuring 41 mm. Venous: Unable to estimate CVP. The inferior vena cava was not well visualized. IAS/Shunts: The interatrial septum was not well visualized. LEFT VENTRICLE PLAX 2D LVIDd:         6.10 cm LVIDs:         4.30 cm LV PW:         1.30 cm LV IVS:        1.20 cm LVOT diam:     2.20 cm LVOT Area:     3.80 cm  RIGHT VENTRICLE TAPSE (M-mode): 2.0 cm LEFT ATRIUM         Index LA diam:    5.10 cm 2.02 cm/m   AORTA Ao Root diam: 4.10 cm TRICUSPID VALVE TR Peak grad:   21.0 mmHg TR Vmax:        229.00 cm/s  SHUNTS Systemic Diam: 2.20 cm Nona Dell MD Electronically signed by Nona Dell MD Signature Date/Time: 07/30/2022/3:14:50 PM    Final (Updated)    CT Angio Chest Pulmonary Embolism (PE) W or WO Contrast  Result Date: 07/29/2022 CLINICAL DATA:  Increased work of breathing. EXAM: CT ANGIOGRAPHY CHEST WITH  CONTRAST TECHNIQUE: Multidetector CT imaging of the chest was performed using the standard protocol during bolus administration of intravenous contrast. Multiplanar CT image reconstructions and MIPs were obtained to evaluate the vascular anatomy. RADIATION DOSE REDUCTION: This exam was performed according to the departmental dose-optimization program which includes automated exposure control, adjustment of the mA and/or kV according to patient size and/or use of iterative reconstruction technique. CONTRAST:  80mL OMNIPAQUE IOHEXOL 350 MG/ML SOLN COMPARISON:  CT angiogram chest 01/02/2022 FINDINGS: Cardiovascular: Ascending aorta is dilated measuring 4.7 cm similar to the prior study. Heart is mildly enlarged. There is no pericardial effusion. There is adequate opacification of the pulmonary arteries to the segmental level. There is no evidence for pulmonary embolism. Mediastinum/Nodes: Thyroid gland is mildly diffusely enlarged with numerous nodules measuring up to 15 mm, similar to prior. There are no enlarged mediastinal or hilar lymph nodes. The esophagus is within normal limits. Lungs/Pleura: There is a small right pleural effusion. There is atelectasis in the bilateral lower lobes. Lungs are otherwise clear. There is no pneumothorax. Upper Abdomen: No acute abnormality. Musculoskeletal: No chest wall abnormality. No acute or significant osseous findings. Review of the MIP images confirms the above findings. IMPRESSION: 1. No evidence for pulmonary embolism. 2. Small right pleural effusion. 3. Mild cardiomegaly. 4. Stable dilatation of the ascending aorta measuring 4.7 cm. Recommend semi-annual imaging followup by CTA or MRA and referral to cardiothoracic surgery if not already obtained. This recommendation follows 2010 ACCF/AHA/AATS/ACR/ASA/SCA/SCAI/SIR/STS/SVM Guidelines for the Diagnosis and  Management of Patients With Thoracic Aortic Disease. Circulation. 2010; 121: Z610-R60. Aortic aneurysm NOS  (ICD10-I71.9) 5. Left Bosniak I benign renal cyst measuring 5.5 cm. No follow-up imaging is recommended. JACR 2018 Feb; 264-273, Management of the Incidental Renal Mass on CT, [employer] 2021; 814-848, Bosniak Classification of Cystic Renal Masses, Version 2019. Electronically Signed   By: Darliss Cheney M.D.   On: 07/29/2022 20:47   US Venous Img Lower Bilateral (DVT)  Result Date: 07/29/2022 CLINICAL DATA:  [id number] SOB (shortness of breath) [id number] EXAM: BILATERAL LOWER EXTREMITY VENOUS DOPPLER ULTRASOUND TECHNIQUE: Gray-scale sonography with graded compression, as well as color Doppler and duplex ultrasound were performed to evaluate the lower extremity deep venous systems from the level of the common femoral vein and including the common femoral, femoral, profunda femoral, popliteal and calf veins including the posterior tibial, peroneal and gastrocnemius veins when visible. The superficial great saphenous vein was also interrogated. Spectral Doppler was utilized to evaluate flow at rest and with distal augmentation maneuvers in the common femoral, femoral and popliteal veins. COMPARISON:  Chest XR, concurrent. FINDINGS: RIGHT LOWER EXTREMITY VENOUS Normal compressibility of the RIGHT common femoral, superficial femoral, and popliteal veins, as well as the visualized calf veins. Visualized portions of profunda femoral vein and great saphenous vein unremarkable. No filling defects to suggest DVT on grayscale or color Doppler imaging. Doppler waveforms show normal direction of venous flow, normal respiratory plasticity and response to augmentation. OTHER No evidence of superficial thrombophlebitis or abnormal fluid collection. Limitations: Patient body habitus LEFT LOWER EXTREMITY VENOUS Normal compressibility of the LEFT common femoral, superficial femoral, and popliteal veins, as well as the visualized calf veins. Visualized portions of profunda femoral vein and great saphenous vein unremarkable. No filling  defects to suggest DVT on grayscale or color Doppler imaging. Doppler waveforms show normal direction of venous flow, normal respiratory plasticity and response to augmentation. Limited views of the contralateral common femoral vein are unremarkable. OTHER No evidence of superficial thrombophlebitis or abnormal fluid collection. Limitations: Patient body habitus IMPRESSION: No evidence of femoropopliteal DVT or superficial thrombophlebitis within either lower extremity. Roanna Banning, MD [facility] Radiology Electronically Signed   By: Roanna Banning M.D.   On: 07/29/2022 17:04   DG Chest 2 View  Result Date: 07/29/2022 CLINICAL DATA:  SOB EXAM: CHEST - 2 VIEW COMPARISON:  Lower extremity DVT ultrasound, concurrent. Chest XR, 01/08/2022. CTA chest 01/02/2022. FINDINGS: Enlargement of the cardiac silhouette. Hypoinflation. No focal consolidation or mass. No pleural effusion or pneumothorax. No acute displaced fracture. IMPRESSION: Cardiomegaly and hypoinflation. No acute superimposed cardiopulmonary process. Electronically Signed   By: Roanna Banning M.D.   On: 07/29/2022 16:49    Scheduled Meds:  apixaban  5 mg Oral BID   Chlorhexidine Gluconate Cloth  6 each Topical Daily   folic acid  1 mg Oral Daily   furosemide  40 mg Intravenous Q12H   metoprolol succinate  100 mg Oral Daily   multivitamin with minerals  1 tablet Oral Daily   pneumococcal 20-valent conjugate vaccine  0.5 mL Intramuscular Tomorrow-1000   thiamine  100 mg Oral Daily   Or   thiamine  100 mg Intravenous Daily   Continuous Infusions:  diltiazem (CARDIZEM) infusion 5 mg/hr (07/31/22 1141)    LOS: 2 days   Critical Care Procedure Note Authorized and Performed by: Maryln Manuel MD  Total Critical Care time:  41 mins Due to a high probability of clinically significant, life threatening deterioration, the patient  required my highest level of preparedness to intervene emergently and I  personally spent this critical care time directly and personally managing the patient.  This critical care time included obtaining a history; examining the patient, pulse oximetry; ordering and review of studies; arranging urgent treatment with development of a management plan; evaluation of patient's response of treatment; frequent reassessment; and discussions with other providers.  This critical care time was performed to assess and manage the high probability of imminent and life threatening deterioration that could result in multi-organ failure.  It was exclusive of separately billable procedures and treating other patients and teaching time.    Standley Dakins, MD How to contact the [facility] Attending or Consulting provider 7A - 7P or covering provider during after hours 7P -7A, for this patient?  Check the care team in [facility] and look for a) attending/consulting [facility] provider listed and b) the [facility] team listed Log into [email or web address] and use [place]'s universal password to access. If you do not have the password, please contact the hospital operator. Locate the [facility] provider you are looking for under [facility] and page to a number that you can be directly reached. If you still have difficulty reaching the provider, please page the [facility] (Director on Call) for the Hospitalists listed on amion for assistance.  07/31/2022, 12:35 PM

## 2022-07-31 NOTE — Progress Notes (Signed)
ANTICOAGULATION CONSULT NOTE  Pharmacy Consult for heparin Indication: atrial fibrillation  No Known Allergies  Patient Measurements: Height: 6\' 3"  (190.5 cm) Weight: 125.7 kg (277 lb 1.9 oz) IBW/kg (Calculated) : 84.5 Heparin Dosing Weight: 111 kg  Vital Signs: Temp: 98.1 F (36.7 C) (06/19 2000) Temp Source: Oral (06/19 2000) BP: 132/100 (06/19 2345) Pulse Rate: 110 (06/19 2345)  Labs: Recent Labs    07/29/22 1557 07/29/22 2206 07/30/22 0427 07/30/22 1343 07/30/22 2051  HGB 10.6*  --  9.8*  --   --   HCT 34.6*  --  32.4*  --   --   PLT 261  --  221  --   --   HEPARINUNFRC  --   --  0.19* 0.28* 0.29*  CREATININE 0.85  --  0.74  --   --   TROPONINIHS 7 8  --   --   --      Estimated Creatinine Clearance: 143.8 mL/min (by C-G formula based on SCr of 0.74 mg/dL).  Assessment: 57 y.o. male with new onset afib on IV heparin. Heparin level still low at 0.29 after rate adjustment. No issues with infusion or overt s/sx of bleeding per RN. Likely transition to po doac once echo is complete and read. No bleeding or IV issues noted.   DOAC copays are $45.    Goal of Therapy:  Heparin level 0.3-0.7 units/ml Monitor platelets by anticoagulation protocol: Yes   Plan:  Increase heparin to 2250 units/hr Recheck heparin level tonight  Ruben Im, PharmD Clinical Pharmacist 07/31/2022 12:01 AM Please check AMION for all [facility] numbers

## 2022-07-31 NOTE — Progress Notes (Signed)
Progress Note  Patient Name: Andrew Haley Date of Encounter: 07/31/2022  Primary Cardiologist: New  Interval Summary   No chest pain or sense of palpitations.  Toprol-XL initiated last evening at 50 mg, still requiring IV diltiazem with increasing heart rate overnight.  He is diuresing well on Lasix.  I discussed the results of his echocardiogram with him.  Vital Signs    Vitals:   07/31/22 0630 07/31/22 0645 07/31/22 0700 07/31/22 0739  BP: 115/83 122/84 121/84   Pulse: (!) 103 86 97   Resp: 15 20 (!) 22   Temp:    98.5 F (36.9 C)  TempSrc:    Oral  SpO2: 90% 92% 96%   Weight:      Height:        Intake/Output Summary (Last 24 hours) at 07/31/2022 0931 Last data filed at 07/31/2022 0700 Gross per 24 hour  Intake 594.42 ml  Output 2350 ml  Net -1755.58 ml   Filed Weights   07/29/22 2100 07/30/22 0436 07/31/22 0500  Weight: 125.2 kg 125.7 kg 125 kg    Physical Exam   GEN: No acute distress.   Neck: No JVD. Cardiac: Irregularly irregular without gallop.  Respiratory: Nonlabored. Clear to auscultation bilaterally. GI: Soft, nontender, bowel sounds present. MS: Improving leg edema.  ECG/Telemetry    Telemetry reviewed showing atrial fibrillation.  Labs    Chemistry Recent Labs  Lab 07/29/22 1557 07/30/22 0427 07/31/22 0446  NA 134* 135 135  K 3.2* 3.6 3.3*  CL 96* 95* 93*  CO2 29 32 31  GLUCOSE 175* 95 107*  BUN 16 16 17   CREATININE 0.85 0.74 0.75  CALCIUM 8.7* 8.5* 8.6*  GFRNONAA >60 >60 >60  ANIONGAP 9 8 11     Hematology Recent Labs  Lab 07/29/22 1557 07/30/22 0427 07/31/22 0446  WBC 10.5 8.3 9.1  RBC 4.26 3.96*  4.04* 4.36  HGB 10.6* 9.8* 10.7*  HCT 34.6* 32.4* 35.5*  MCV 81.2 81.8 81.4  MCH 24.9* 24.7* 24.5*  MCHC 30.6 30.2 30.1  RDW 15.6* 15.5 15.3  PLT 261 221 244   Cardiac Enzymes Recent Labs  Lab 07/29/22 1557 07/29/22 2206  TROPONINIHS 7 8   Lipid Panel     Component Value Date/Time   TRIG 60 01/03/2022 0500     Cardiac Studies   Echocardiogram 07/30/2022:  1. Limited study.   2. Left ventricular ejection fraction, by estimation, is 30 to 35%. The  left ventricle has moderately decreased function. The left ventricle  demonstrates global hypokinesis. The left ventricular internal cavity size  was mildly dilated. There is mild  concentric left ventricular hypertrophy. Left ventricular diastolic  parameters are indeterminate.   3. No formed LV mural thrombus noted with Definity contrast.   4. RV-RA gradient 21 mmHg suggesting normal estimated RVSP in the setting  of a normal CVP. Right ventricular systolic function is mildly reduced.  The right ventricular size is normal.   5. Right atrial size was mildly dilated.   6. Aortic dilatation noted. There is mild dilatation of the aortic root,  measuring 41 mm.   7. Unable to estimate CVP.   8. The aortic valve was not well visualized, likely trileaflet. There is  mild calcification of the aortic valve. Aortic valve regurgitation is not  visualized. Aortic valve sclerosis/calcification is present, without any  evidence of aortic stenosis.   Assessment & Plan   1.  Persistent atrial fibrillation presenting with RVR, duration uncertain but potentially weeks  to months.  CHA2DS2-VASc score is 2.  He has an associated cardiomyopathy that may well be tachycardia induced, although recurring cocaine use also complicates the picture.  2.  UDS positive for cocaine this admission.  Discussed this morning, patient states he uses once a month.  I stressed the importance of absolute cessation.  3.  HFrEF, LVEF 30 to 35% with global hypokinesis.  Suspect tachycardia induced or potentially related to cocaine use.  4.  Essential hypertension.  5.  Asymptomatic thoracic aortic aneurysm, 4.7 cm by CT imaging and follow-up by TCTS.  Plan to increase Toprol-XL to 100 mg this morning and try and wean off Cardizem if possible.  Also transition from heparin to  Eliquis 5 mg twice daily.  Would not plan on cardioversion this hospital stay.  Once heart rate controlled on beta-blocker can further try and optimize GDMT.  Still needs further diuresis as well.  For questions or updates, please contact [facility] Please consult [email or web address] for contact info under   Signed, Nona Dell, MD  07/31/2022, 9:31 AM

## 2022-07-31 NOTE — Plan of Care (Signed)
  Problem: Education: Goal: Knowledge of General Education information will improve Description Including pain rating scale, medication(s)/side effects and non-pharmacologic comfort measures Outcome: Progressing   

## 2022-07-31 NOTE — TOC Progression Note (Signed)
Transition of Care [facility]) - Progression Note    Patient Details  Name: Andrew Haley MRN: [id number] Date of Birth: [birth year]  Transition of Care [facility] CM/SW Contact  Elliot Gault, LCSW Phone Number: 07/31/2022, 2:07 PM  Clinical Narrative:      SA treatment resources added to AVS.   Barriers to Discharge: Continued Medical Work up  Expected Discharge Plan and Services                                               Social Determinants of Health (SDOH) Interventions SDOH Screenings   Food Insecurity: No Food Insecurity (07/29/2022)  Housing: Low Risk  (07/29/2022)  Transportation Needs: No Transportation Needs (07/29/2022)  Utilities: Not At Risk (07/29/2022)  Tobacco Use: Low Risk  (07/29/2022)    Readmission Risk Interventions    07/30/2020   12:17 PM  Readmission Risk Prevention Plan  Post Dischage Appt Complete  Medication Screening Complete  Transportation Screening Complete

## 2022-07-31 NOTE — Progress Notes (Signed)
ANTICOAGULATION CONSULT NOTE - Follow Up Consult  Pharmacy Consult for heparin Indication: atrial fibrillation  No Known Allergies  Patient Measurements: Height: 6\' 3"  (190.5 cm) Weight: 125 kg (275 lb 9.2 oz) IBW/kg (Calculated) : 84.5 Heparin Dosing Weight: 111 kg  Vital Signs: Temp: 98.5 F (36.9 C) (06/20 0739) Temp Source: Oral (06/20 0739) BP: 121/84 (06/20 0700) Pulse Rate: 97 (06/20 0700)  Labs: Recent Labs    07/29/22 1557 07/29/22 1557 07/29/22 2206 07/30/22 0427 07/30/22 1343 07/30/22 2051 07/31/22 0446 07/31/22 0815  HGB 10.6*  --   --  9.8*  --   --  10.7*  --   HCT 34.6*  --   --  32.4*  --   --  35.5*  --   PLT 261  --   --  221  --   --  244  --   HEPARINUNFRC  --    < >  --  0.19* 0.28* 0.29*  --  0.28*  CREATININE 0.85  --   --  0.74  --   --  0.75  --   TROPONINIHS 7  --  8  --   --   --   --   --    < > = values in this interval not displayed.    Estimated Creatinine Clearance: 143.4 mL/min (by C-G formula based on SCr of 0.75 mg/dL).   Assessment: 57 y.o. male with new onset afib on IV heparin. Heparin level still low at 0.28 after rate adjustment. No bleeding or IV issues noted.  Likely transition to po DOAC once echo is complete and read. DOAC copays are $45.   Goal of Therapy:  Heparin level 0.3-0.7 units/ml Monitor platelets by anticoagulation protocol: Yes   Plan:  Bolus 1650 units heparin x1 Increase heparin to 2500 units/hour Recheck heparin level in 6-8 hours   Antawan Mchugh 07/31/2022,8:43 AM

## 2022-08-01 ENCOUNTER — Inpatient Hospital Stay (HOSPITAL_COMMUNITY): Payer: Medicare HMO

## 2022-08-01 DIAGNOSIS — F10939 Alcohol use, unspecified with withdrawal, unspecified: Secondary | ICD-10-CM | POA: Diagnosis not present

## 2022-08-01 DIAGNOSIS — E876 Hypokalemia: Secondary | ICD-10-CM | POA: Diagnosis not present

## 2022-08-01 DIAGNOSIS — R000 Tachycardia, unspecified: Secondary | ICD-10-CM | POA: Diagnosis not present

## 2022-08-01 DIAGNOSIS — I4891 Unspecified atrial fibrillation: Secondary | ICD-10-CM | POA: Diagnosis not present

## 2022-08-01 LAB — CBC
HCT: 35.5 % — ABNORMAL LOW (ref 39.0–52.0)
Hemoglobin: 10.8 g/dL — ABNORMAL LOW (ref 13.0–17.0)
MCH: 24.8 pg — ABNORMAL LOW (ref 26.0–34.0)
MCHC: 30.4 g/dL (ref 30.0–36.0)
MCV: 81.4 fL (ref 80.0–100.0)
Platelets: 303 10*3/uL (ref 150–400)
RBC: 4.36 MIL/uL (ref 4.22–5.81)
RDW: 15.3 % (ref 11.5–15.5)
WBC: 8.3 10*3/uL (ref 4.0–10.5)
nRBC: 0 % (ref 0.0–0.2)

## 2022-08-01 LAB — BASIC METABOLIC PANEL
Anion gap: 9 (ref 5–15)
BUN: 21 mg/dL — ABNORMAL HIGH (ref 6–20)
CO2: 33 mmol/L — ABNORMAL HIGH (ref 22–32)
Calcium: 8.4 mg/dL — ABNORMAL LOW (ref 8.9–10.3)
Chloride: 91 mmol/L — ABNORMAL LOW (ref 98–111)
Creatinine, Ser: 0.89 mg/dL (ref 0.61–1.24)
GFR, Estimated: >60 mL/min (ref >60)
Glucose, Bld: 134 mg/dL — ABNORMAL HIGH (ref 70–99)
Potassium: 3.1 mmol/L — ABNORMAL LOW (ref 3.5–5.1)
Sodium: 133 mmol/L — ABNORMAL LOW (ref 135–145)

## 2022-08-01 LAB — PHOSPHORUS: Phosphorus: 4 mg/dL (ref 2.5–4.6)

## 2022-08-01 LAB — MAGNESIUM: Magnesium: 2 mg/dL (ref 1.7–2.4)

## 2022-08-01 LAB — BRAIN NATRIURETIC PEPTIDE: B Natriuretic Peptide: 309 pg/mL — ABNORMAL HIGH (ref 0.0–100.0)

## 2022-08-01 MED ORDER — SPIRONOLACTONE 12.5 MG HALF TABLET
12.5000 mg | ORAL_TABLET | Freq: Every day | ORAL | Status: DC
  Administered 2022-08-01 – 2022-08-02 (×2): 12.5 mg via ORAL
  Filled 2022-08-01 (×2): qty 1

## 2022-08-01 MED ORDER — METOPROLOL SUCCINATE ER 50 MG PO TB24
150.0000 mg | ORAL_TABLET | Freq: Every day | ORAL | Status: DC
  Administered 2022-08-01 – 2022-08-02 (×2): 150 mg via ORAL
  Filled 2022-08-01 (×2): qty 3

## 2022-08-01 MED ORDER — POTASSIUM CHLORIDE CRYS ER 20 MEQ PO TBCR
40.0000 meq | EXTENDED_RELEASE_TABLET | Freq: Two times a day (BID) | ORAL | Status: DC
  Administered 2022-08-01 – 2022-08-02 (×3): 40 meq via ORAL
  Filled 2022-08-01 (×3): qty 2

## 2022-08-01 NOTE — Progress Notes (Signed)
Report given to [facility] on 300

## 2022-08-01 NOTE — Progress Notes (Signed)
Progress Note  Patient Name: Andrew Haley Date of Encounter: 08/01/2022  Primary Cardiologist: New  Interval Summary   Reports no palpitations or chest discomfort, no shortness of breath at rest.  He is now off intravenous diltiazem.  Toprol-XL 100 mg daily given yesterday.  Leg swelling improved although not completely resolved.  Vital Signs    Vitals:   08/01/22 0548 08/01/22 0600 08/01/22 0700 08/01/22 0723  BP: (!) 129/105 (!) 124/93 (!) 129/92   Pulse: (!) 111 (!) 54 (!) 113   Resp: 18  19   Temp:    97.7 F (36.5 C)  TempSrc:    Oral  SpO2: 97% 96% 95%   Weight:      Height:        Intake/Output Summary (Last 24 hours) at 08/01/2022 0834 Last data filed at 07/31/2022 2046 Gross per 24 hour  Intake 44.11 ml  Output 900 ml  Net -855.89 ml   Filed Weights   07/29/22 2100 07/30/22 0436 07/31/22 0500  Weight: 125.2 kg 125.7 kg 125 kg    Physical Exam   GEN: No acute distress.   Neck: No JVD. Cardiac: Irregularly irregular without gallop.  Respiratory: Nonlabored. Clear to auscultation bilaterally. GI: Soft, nontender, bowel sounds present. MS: Improving leg edema, left greater than right (patient states this is chronic).  ECG/Telemetry    Telemetry reviewed showing atrial fibrillation.  Labs    Chemistry Recent Labs  Lab 07/30/22 0427 07/31/22 0446 08/01/22 0423  NA 135 135 133*  K 3.6 3.3* 3.1*  CL 95* 93* 91*  CO2 32 31 33*  GLUCOSE 95 107* 134*  BUN 16 17 21*  CREATININE 0.74 0.75 0.89  CALCIUM 8.5* 8.6* 8.4*  GFRNONAA >60 >60 >60  ANIONGAP 8 11 9     Hematology Recent Labs  Lab 07/30/22 0427 07/31/22 0446 08/01/22 0423  WBC 8.3 9.1 8.3  RBC 3.96*  4.04* 4.36 4.36  HGB 9.8* 10.7* 10.8*  HCT 32.4* 35.5* 35.5*  MCV 81.8 81.4 81.4  MCH 24.7* 24.5* 24.8*  MCHC 30.2 30.1 30.4  RDW 15.5 15.3 15.3  PLT 221 244 303   Cardiac Enzymes Recent Labs  Lab 07/29/22 1557 07/29/22 2206  TROPONINIHS 7 8   Lipid Panel     Component  Value Date/Time   TRIG 60 01/03/2022 0500    Cardiac Studies   Echocardiogram 07/30/2022:  1. Limited study.   2. Left ventricular ejection fraction, by estimation, is 30 to 35%. The  left ventricle has moderately decreased function. The left ventricle  demonstrates global hypokinesis. The left ventricular internal cavity size  was mildly dilated. There is mild  concentric left ventricular hypertrophy. Left ventricular diastolic  parameters are indeterminate.   3. No formed LV mural thrombus noted with Definity contrast.   4. RV-RA gradient 21 mmHg suggesting normal estimated RVSP in the setting  of a normal CVP. Right ventricular systolic function is mildly reduced.  The right ventricular size is normal.   5. Right atrial size was mildly dilated.   6. Aortic dilatation noted. There is mild dilatation of the aortic root,  measuring 41 mm.   7. Unable to estimate CVP.   8. The aortic valve was not well visualized, likely trileaflet. There is  mild calcification of the aortic valve. Aortic valve regurgitation is not  visualized. Aortic valve sclerosis/calcification is present, without any  evidence of aortic stenosis.   Assessment & Plan   1.  Persistent atrial fibrillation presenting with  RVR, duration uncertain but potentially weeks to months.  CHA2DS2-VASc score is 2.  He has an associated cardiomyopathy that may well be tachycardia induced, although recurring cocaine use also complicates the picture.  Transition from intravenous diltiazem to Toprol-XL and on Eliquis for stroke prophylaxis.  2.  UDS positive for cocaine this admission.  Discussed this morning, patient states he uses once a month.  I stressed the importance of absolute cessation.  3.  HFrEF, LVEF 30 to 35% with global hypokinesis.  Suspect tachycardia induced or potentially related to cocaine use.  4.  Essential hypertension.  Blood pressure trending up somewhat.  5.  Asymptomatic thoracic aortic aneurysm, 4.7 cm  by CT imaging and follow-up by TCTS.  Check Reds clip measurement this morning.  Clinically diuresing with improving leg edema on IV Lasix (net urine output of approximately 900 cc last 24 hours).  Continue to replete potassium.  Starting Aldactone 12.5 mg daily.  Increase Toprol-XL to 150 mg daily.  Continue Eliquis.  Can most likely transfer out of the unit.  Will follow with further recommendations this afternoon.  For questions or updates, please contact [facility] Please consult [email or web address] for contact info under   Signed, Nona Dell, MD  08/01/2022, 8:34 AM

## 2022-08-01 NOTE — Progress Notes (Signed)
   Progress Note  Patient Name: Andrew Haley Date of Encounter: 08/01/2022  Reds clip measurement 38 from this morning.  Transferred out to telemetry.  Would continue Lasix 40 mg IV twice daily through today, reassess renal function and urine output tomorrow.  Most likely could be discharged home at that time if heart rate control adequate on Toprol-XL 150 mg daily.  Continue Eliquis, Aldactone 12.5 mg daily, and if blood pressure allows would try and start low-dose ARB such as Cozaar 12.5 to 25 mg daily.  As far as outpatient diuretic dose would try to go back to Lasix 40 mg daily.  This may need to be adjusted further as an outpatient as GDMT is modified.  If he goes home over the weekend would please set him up for a follow-up visit with [place] in the next few weeks and BMET at that time.  For questions or updates, please contact [facility] Please consult [email or web address] for contact info under   Signed, Nona Dell, MD  08/01/2022, 1:30 PM

## 2022-08-01 NOTE — Care Management Important Message (Signed)
Important Message  Patient Details  Name: Andrew Haley MRN: [id number] Date of Birth: May 12, 1965   Medicare Important Message Given:  Yes     Corey Harold 08/01/2022, 4:25 PM

## 2022-08-01 NOTE — Progress Notes (Signed)
PROGRESS NOTE   Andrew Haley  ZHY:[id number] DOB: 09-Apr-1965 DOA: 07/29/2022 PCP: Elfredia Nevins, MD   Chief Complaint  Patient presents with   Leg Swelling   Atrial Fibrillation   Level of care: Telemetry  Brief Admission History:  57 y.o. male with medical history significant for hypertension, prostate cancer, status epilepticus.  Patient presented to the ED with complaints of bilateral lower extremity swelling over the past 2 months, at baseline he reports some swelling to his left lower extremity only.  Reports abdominal bloating, and weight gain.  Reports baseline weight is about 265 pounds. Over the past 3 to 4 days he has had increasing difficulty breathing.  Reports, he was rowing a boat over the weekends- 3 days ago,- about 5 miles stretch, really exerted himself, and since then he has not been able to catch his breath.  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  No difficulty breathing.  Never smoked cigarettes.  No chest pain.  No palpitation.  No cardiac history.   ED Course: Tmax 98.7.  Heart rate 109-154.  Respiratory rate 14-24.  Blood pressure systolic 1 17-1 36.  O2 sats 92 to 98% on room air.  BNP elevated at 540.  WBC 10.5.  Potassium 3.2.  Chest x-ray negative for acute abnormality.  Bilateral lower extremity venous Dopplers negative for DVT.  EKG shows atrial fibrillation with rate 145.  Patient was started on Cardizem, 10 mg bolus and drip.  With improvement in heart rate.  IV Lasix 80 mg x 1 given.     Assessment and Plan: * Atrial fibrillation with RVR Presenting with difficulty breathing, bilateral lower extremity swelling, appears to be new onset CHF.  Heart rates up to 150s, no palpitations no chest pain.  No cardiac history.  CHADS2Vasc score.  Denies history of frequent falls, or GI bleed. -Check TSH-normal 1.5. - Mg and K being repleted, follow -weaned off cardizem  infusion -transitioned IV heparin to oral apixaban, risks and benefit of anticoagulation explained to patient, who verbalized understanding   Tachycardia induced cardiomyopathy --appreciate cardiology team assistance --HR control has improved --cardiology increased his toprol XL to 150 mg daily on 6/21 --He has been weaned off IV diltiazem    Acute HFrEF He presented with 2+ bilateral lower extremity pitting edema, abdominal bloating.  In setting of atrial fibrillation with RVR.  Reports baseline weight of about 265 pounds, weighing 285 pounds today.  Checks confirm weight gain.  BNP elevated at 540.  Echo 12/2021, EF 55 to 60%, LV diastolic function undetermined Repeat Echo 07/30/22: LVEF 30-35% global hypokinesis, no LV mural thrombus -IV Lasix 80 mg x 1 given in ED, continue 40 twice daily -Input output, daily weights, daily BMP -BiIateral venous Dopplers negative for DVT -He appears quite dyspneic with obvious increased work of breathing, clear lungs on auscultation, but he is not hypoxic, CTA chest obtained to rule out other etiology-due to for PE, showed small right pleural effusion and mild cardiomegaly. -started on toprol XL 6/19 by cardiology and increased to 150 mg daily 6/21 -spironolactone 12.5 mg daily started 6/21  Intake/Output Summary (Last 24 hours) at 08/01/2022 0848 Last data filed at 07/31/2022 2046 Gross per 24 hour  Intake 44.11 ml  Output 900 ml  Net -855.89 ml   Filed Weights   07/29/22 2100 07/30/22 0436 07/31/22 0500  Weight: 125.2 kg 125.7 kg 125 kg   ReDs  Vest Reading today pending  History of recreational drug use --urine toxicology screen positive for cocaine use --will consult TOC to provide outpatient treatment resources  Aneurysm of ascending aorta (HCC) CT ED chest today-  Stable dilatation of the ascending aorta measuring 4.7 cm. Recommend semi-annual imaging followup by CTA or MRA and referral to cardiothoracic surgery if not already  obtained. -Patient follows routinely with his vascular surgeon.  Hypokalemia -- working to replete Mg and K -- Mg improved to 2.2 -- additional oral potassium ordered and spironolactone started by cardiology 6/21    History of Alcohol withdrawal seizure with complication Reports no alcoholic beverage in the past 2 weeks due to SOB, but previously drank 3-4 beers twice a week.  Potassium 3.2, magnesium 1.6. - Replete electrolytes - CIWA as needed - Thiamine, folate, multivitamins  Essential hypertension Stable. -Holding losartan/hydrochlorothiazide, Norvasc to allow for titration of rate limiting medications   DVT prophylaxis: apixaban  Code Status: Full  Family Communication:  Disposition: Status is: Inpatient Remains inpatient appropriate because: intensity   Consultants:  cardiology Procedures:  TTE limited 07/30/22: LVEF 30-35% with global hypokinesis Antimicrobials:  N/a    Subjective: Pt reports no SOB, no CP, no palpitations today.      Objective: Vitals:   08/01/22 0600 08/01/22 0700 08/01/22 0723 08/01/22 0800  BP: (!) 124/93 (!) 129/92  (!) 141/94  Pulse: (!) 54 (!) 113  70  Resp:  19  18  Temp:   97.7 F (36.5 C)   TempSrc:   Oral   SpO2: 96% 95%  97%  Weight:      Height:        Intake/Output Summary (Last 24 hours) at 08/01/2022 0854 Last data filed at 07/31/2022 2046 Gross per 24 hour  Intake 44.11 ml  Output 900 ml  Net -855.89 ml   Filed Weights   07/29/22 2100 07/30/22 0436 07/31/22 0500  Weight: 125.2 kg 125.7 kg 125 kg   Examination:  General exam: Appears calm and comfortable  Respiratory system: BBS no increased work of breathing. Cardiovascular system: irregularly irregular, normal S1 & S2 heard. No JVD, murmurs, rubs, gallops or clicks. 1+ pedal edema. Gastrointestinal system: Abdomen is nondistended, soft and nontender. No organomegaly or masses felt. Normal bowel sounds heard. Central nervous system: Alert and oriented. No focal  neurological deficits. Extremities: 1+ edema BLEs, improving, 1-2+ pedal edema. Skin: No rashes, lesions or ulcers. Psychiatry: Judgement and insight UTD. Mood & affect appropriate.   Data Reviewed: I have personally reviewed following labs and imaging studies  CBC: Recent Labs  Lab 07/29/22 1557 07/30/22 0427 07/31/22 0446 08/01/22 0423  WBC 10.5 8.3 9.1 8.3  HGB 10.6* 9.8* 10.7* 10.8*  HCT 34.6* 32.4* 35.5* 35.5*  MCV 81.2 81.8 81.4 81.4  PLT 261 221 244 303    Basic Metabolic Panel: Recent Labs  Lab 07/29/22 1557 07/29/22 2206 07/30/22 0427 07/31/22 0446 08/01/22 0423  NA 134*  --  135 135 133*  K 3.2*  --  3.6 3.3* 3.1*  CL 96*  --  95* 93* 91*  CO2 29  --  32 31 33*  GLUCOSE 175*  --  95 107* 134*  BUN 16  --  16 17 21*  CREATININE 0.85  --  0.74 0.75 0.89  CALCIUM 8.7*  --  8.5* 8.6* 8.4*  MG 1.6*  --  2.0 1.8 2.0  PHOS  --  4.5  --   --  4.0    CBG: No  results for input(s): "GLUCAP" in the last 168 hours.  Recent Results (from the past 240 hour(s))  MRSA Next Gen by PCR, Nasal     Status: None   Collection Time: 07/29/22  8:52 PM   Specimen: Nasal Mucosa; Nasal Swab  Result Value Ref Range Status   MRSA by PCR Next Gen NOT DETECTED NOT DETECTED Final    Comment: (NOTE) The GeneXpert MRSA Assay (FDA approved for NASAL specimens only), is one component of a comprehensive MRSA colonization surveillance program. It is not intended to diagnose MRSA infection nor to guide or monitor treatment for MRSA infections. Test performance is not FDA approved in patients less than 3 years old. Performed at [facility], [address] [place], [place] [address]      Radiology Studies: DG CHEST PORT 1 VIEW  Result Date: 08/01/2022 CLINICAL DATA:  Acute heart failure. Leg swelling and atrial fibrillation. EXAM: PORTABLE CHEST 1 VIEW COMPARISON:  07/29/2022. FINDINGS: The heart is enlarged and the mediastinal contour is within normal limits. Lung volumes are low  with mild atelectasis at the left lung base. There is a small right pleural effusion. No pneumothorax is seen. No acute osseous abnormality. IMPRESSION: 1. Small right pleural effusion. 2. Cardiomegaly. Electronically Signed   By: Thornell Sartorius M.D.   On: 08/01/2022 04:58   ECHOCARDIOGRAM LIMITED  Result Date: 07/30/2022    ECHOCARDIOGRAM LIMITED REPORT   Patient Name:   Andrew Haley Date of Exam: 07/30/2022 Medical Rec #:  [id number]    Height:       75.0 in Accession #:    [id number]   Weight:       277.1 lb Date of Birth:  1965-03-13   BSA:          2.521 m Patient Age:    56 years     BP:           134/93 mmHg Patient Gender: M            HR:           102 bpm. [facility] Procedure: Cardiac Doppler, Color Doppler and Limited Echo Indications:     Congestive Heart Failure I50.9  History:         Patient has prior history of Echocardiogram examinations, most                  recent 01/03/2022. Arrythmias:Atrial Fibrillation; Risk                  Factors:Hypertension. History of Alcohol withdrawal seizure                  with complication.  Sonographer:     Celesta Gentile RCS Referring Phys:  [id number] Hel[address] Beecham [facility] Diagnosing Phys: Nona Dell MD IMPRESSIONS  1. Limited study.  2. Left ventricular ejection fraction, by estimation, is 30 to 35%. The left ventricle has moderately decreased function. The left ventricle demonstrates global hypokinesis. The left ventricular internal cavity size was mildly dilated. There is mild concentric left ventricular hypertrophy. Left ventricular diastolic parameters are indeterminate.  3. No formed LV mural thrombus noted with Definity contrast.  4. RV-RA gradient 21 mmHg suggesting normal estimated RVSP in the setting of a normal CVP. Right ventricular systolic function is mildly reduced. The right ventricular size is normal.  5. Right atrial size was mildly dilated.  6. Aortic dilatation noted. There is mild dilatation of the aortic root, measuring 41  mm.  7. Unable to estimate CVP.  8. The aortic valve was not well visualized, likely trileaflet. There is mild calcification of the aortic valve. Aortic valve regurgitation is not visualized. Aortic valve sclerosis/calcification is present, without any evidence of aortic stenosis. Comparison(s): Prior images reviewed side by side. FINDINGS  Left Ventricle: Left ventricular ejection fraction, by estimation, is 30 to 35%. The left ventricle has moderately decreased function. The left ventricle demonstrates global hypokinesis. Definity contrast agent was given IV to delineate the left ventricular endocardial borders. The left ventricular internal cavity size was mildly dilated. There is mild concentric left ventricular hypertrophy. Left ventricular diastolic parameters are indeterminate. Left ventricular diastolic function could not be evaluated due to atrial fibrillation. Right Ventricle: RV-RA gradient 21 mmHg suggesting normal estimated RVSP in the setting of a normal CVP. The right ventricular size is normal. No increase in right ventricular wall thickness. Right ventricular systolic function is mildly reduced. Right Atrium: Right atrial size was mildly dilated. Pericardium: Trivial pericardial effusion is present. The pericardial effusion is posterior to the left ventricle. Tricuspid Valve: The tricuspid valve is grossly normal. Tricuspid valve regurgitation is mild. Aortic Valve: The aortic valve was not well visualized. There is mild calcification of the aortic valve. There is mild to moderate aortic valve annular calcification. Aortic valve regurgitation is not visualized. Aortic valve sclerosis/calcification is present, without any evidence of aortic stenosis. Pulmonic Valve: The pulmonic valve was grossly normal. Pulmonic valve regurgitation is trivial. Aorta: Aortic dilatation noted. There is mild dilatation of the aortic root, measuring 41 mm. Venous: Unable to estimate CVP. The inferior vena cava was not  well visualized. IAS/Shunts: The interatrial septum was not well visualized. LEFT VENTRICLE PLAX 2D LVIDd:         6.10 cm LVIDs:         4.30 cm LV PW:         1.30 cm LV IVS:        1.20 cm LVOT diam:     2.20 cm LVOT Area:     3.80 cm  RIGHT VENTRICLE TAPSE (M-mode): 2.0 cm LEFT ATRIUM         Index LA diam:    5.10 cm 2.02 cm/m   AORTA Ao Root diam: 4.10 cm TRICUSPID VALVE TR Peak grad:   21.0 mmHg TR Vmax:        229.00 cm/s  SHUNTS Systemic Diam: 2.20 cm Nona Dell MD Electronically signed by Nona Dell MD Signature Date/Time: 07/30/2022/3:14:50 PM    Final (Updated)     Scheduled Meds:  apixaban  5 mg Oral BID   Chlorhexidine Gluconate Cloth  6 each Topical Daily   folic acid  1 mg Oral Daily   furosemide  40 mg Intravenous Q12H   metoprolol succinate  150 mg Oral Daily   multivitamin with minerals  1 tablet Oral Daily   pneumococcal 20-valent conjugate vaccine  0.5 mL Intramuscular Tomorrow-1000   potassium chloride  40 mEq Oral BID   spironolactone  12.5 mg Oral Daily   thiamine  100 mg Oral Daily   Or   thiamine  100 mg Intravenous Daily   Continuous Infusions:    LOS: 3 days   Time spent: 40 mins  Grason Brailsford Laural Benes, MD How to contact the [facility] Attending or Consulting provider 7A - 7P or covering provider during after hours 7P -7A, for this patient?  Check the care team in [facility] and look for a) attending/consulting [facility] provider listed and b)  the [facility] team listed Log into [email or web address] and use 's universal password to access. If you do not have the password, please contact the hospital operator. Locate the [facility] provider you are looking for under [facility] and page to a number that you can be directly reached. If you still have difficulty reaching the provider, please page the [facility] (Director on Call) for the Hospitalists listed on amion for assistance.  08/01/2022, 8:54 AM

## 2022-08-01 NOTE — Progress Notes (Signed)
   08/01/22 0800  ReDS Vest / Clip  Station Marker D  Ruler Value 33  ReDS Value Range 36 - 40  ReDS Actual Value 38

## 2022-08-02 DIAGNOSIS — I4891 Unspecified atrial fibrillation: Secondary | ICD-10-CM | POA: Diagnosis not present

## 2022-08-02 DIAGNOSIS — R000 Tachycardia, unspecified: Secondary | ICD-10-CM | POA: Diagnosis not present

## 2022-08-02 DIAGNOSIS — I10 Essential (primary) hypertension: Secondary | ICD-10-CM | POA: Diagnosis not present

## 2022-08-02 DIAGNOSIS — E876 Hypokalemia: Secondary | ICD-10-CM | POA: Diagnosis not present

## 2022-08-02 LAB — BASIC METABOLIC PANEL
Anion gap: 8 (ref 5–15)
BUN: 23 mg/dL — ABNORMAL HIGH (ref 6–20)
CO2: 33 mmol/L — ABNORMAL HIGH (ref 22–32)
Calcium: 8.3 mg/dL — ABNORMAL LOW (ref 8.9–10.3)
Chloride: 95 mmol/L — ABNORMAL LOW (ref 98–111)
Creatinine, Ser: 0.9 mg/dL (ref 0.61–1.24)
GFR, Estimated: >60 mL/min (ref >60)
Glucose, Bld: 106 mg/dL — ABNORMAL HIGH (ref 70–99)
Potassium: 3.5 mmol/L (ref 3.5–5.1)
Sodium: 136 mmol/L (ref 135–145)

## 2022-08-02 LAB — CBC
HCT: 34.8 % — ABNORMAL LOW (ref 39.0–52.0)
Hemoglobin: 10.6 g/dL — ABNORMAL LOW (ref 13.0–17.0)
MCH: 24.8 pg — ABNORMAL LOW (ref 26.0–34.0)
MCHC: 30.5 g/dL (ref 30.0–36.0)
MCV: 81.5 fL (ref 80.0–100.0)
Platelets: 282 10*3/uL (ref 150–400)
RBC: 4.27 MIL/uL (ref 4.22–5.81)
RDW: 15.4 % (ref 11.5–15.5)
WBC: 5.9 10*3/uL (ref 4.0–10.5)
nRBC: 0 % (ref 0.0–0.2)

## 2022-08-02 MED ORDER — LOSARTAN POTASSIUM 25 MG PO TABS: 12.5000 mg | ORAL_TABLET | Freq: Every day | ORAL | 2 refills | Status: DC

## 2022-08-02 MED ORDER — ADULT MULTIVITAMIN W/MINERALS CH: 1.0000 | ORAL_TABLET | Freq: Every day | ORAL | Status: DC

## 2022-08-02 MED ORDER — METOPROLOL SUCCINATE ER 50 MG PO TB24: 150.0000 mg | ORAL_TABLET | Freq: Every day | ORAL | 2 refills | Status: DC

## 2022-08-02 MED ORDER — FUROSEMIDE 40 MG PO TABS: 40.0000 mg | ORAL_TABLET | Freq: Every day | ORAL | 2 refills | Status: DC

## 2022-08-02 MED ORDER — APIXABAN 5 MG PO TABS: 5.0000 mg | ORAL_TABLET | Freq: Two times a day (BID) | ORAL | 2 refills | Status: DC

## 2022-08-02 MED ORDER — VITAMIN B-1 100 MG PO TABS: 100.0000 mg | ORAL_TABLET | Freq: Every day | ORAL | 1 refills | Status: DC

## 2022-08-02 MED ORDER — FOLIC ACID 1 MG PO TABS: 1.0000 mg | ORAL_TABLET | Freq: Every day | ORAL | 1 refills | Status: DC

## 2022-08-02 MED ORDER — SPIRONOLACTONE 25 MG PO TABS: 12.5000 mg | ORAL_TABLET | Freq: Every day | ORAL | 2 refills | Status: DC

## 2022-08-02 MED ORDER — POTASSIUM CHLORIDE CRYS ER 20 MEQ PO TBCR: 20.0000 meq | EXTENDED_RELEASE_TABLET | Freq: Every day | ORAL | 2 refills | Status: DC

## 2022-08-02 NOTE — Discharge Summary (Addendum)
Physician Discharge Summary  Andrew Haley WJX:[id number] DOB: [birth year] DOA: 07/29/2022  PCP: Elfredia Nevins, MD Cardiology: [facility] Admit date: 07/29/2022 Discharge date: 08/02/2022  Admitted From:  HOME  Disposition:  HOME   Recommendations for Outpatient Follow-up:  Follow up with PCP in 1 weeks Please follow up with cardiology in 3 weeks Please check BMP/CBC in 1 week with PCP and in 3-4 weeks with cardiology office Bleeding precautions while on anticoagulation therapy Avoid all recreational substances and alcohol    Discharge Condition: STABLE   CODE STATUS: FULL DIET: 2 gram sodium restricted, fluid restriction 1.8L per day   Brief Hospitalization Summary: Please see all hospital notes, images, labs for full details of the hospitalization. ADMISSION PROVIDER HPI:  57 y.o. male with medical history significant for hypertension, prostate cancer, status epilepticus.  Patient presented to the ED with complaints of bilateral lower extremity swelling over the past 2 months, at baseline he reports some swelling to his left lower extremity only.  Reports abdominal bloating, and weight gain.  Reports baseline weight is about 265 pounds. Over the past 3 to 4 days he has had increasing difficulty breathing.  Reports, he was rowing a boat over the weekends- 3 days ago,- about 5 miles stretch, really exerted himself, and since then he has not been able to catch his breath.  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  No difficulty breathing.  Never smoked cigarettes.  No chest pain.  No palpitation.  No cardiac history.   ED Course: Tmax 98.7.  Heart rate 109-154.  Respiratory rate 14-24.  Blood pressure systolic 1 17-1 36.  O2 sats 92 to 98% on room air.  BNP elevated at 540.  WBC 10.5.  Potassium 3.2.  Chest x-ray negative for acute abnormality.  Bilateral lower extremity venous Dopplers negative  for DVT.  EKG shows atrial fibrillation with rate 145.  Patient was started on Cardizem, 10 mg bolus and drip.  With improvement in heart rate.  IV Lasix 80 mg x 1 given.   HOSPITAL COURSE BY PROBLEM   * Atrial fibrillation with RVR Presenting with difficulty breathing, bilateral lower extremity swelling, appears to be new onset CHF.  Heart rates up to 150s, no palpitations no chest pain.  No cardiac history.  CHADS2Vasc score 2.  Denies history of frequent falls, or GI bleed. -Check TSH-normal 1.5. - Mg and K repleted -weaned off cardizem infusion -transitioned IV heparin to oral apixaban, risks and benefit of anticoagulation explained to patient, who verbalized understanding  -pt was counseled by pharm D regarding apixaban and given co-pay card  Tachycardia induced cardiomyopathy --appreciate cardiology team assistance --HR has been controlled well in last 36 hours --cardiology increased his toprol XL to 150 mg daily on 6/21 --He has been weaned off IV diltiazem   --follow up with cardiology office in 3-4 weeks --follow up with PCP in 1 week for recheck  --new prescriptions sent to pharmacy for patient   Acute HFrEF He presented with 2+ bilateral lower extremity pitting edema, abdominal bloating.  In setting of atrial fibrillation with RVR.  Reports baseline weight of about 265 pounds.  He presented at a weight of 285 pounds and diuresed down to a weight of 268 pounds on discharge day.    BNP was elevated at 540.  Echo 12/2021, EF 55 to 60%, LV diastolic function undetermined Repeat Echo 07/30/22: LVEF 30-35% global  hypokinesis, no LV mural thrombus -IV Lasix 80 mg x 1 given in ED, continued IV furosemide 40 twice daily thru 6/22 -Input output, daily weights, daily BMP in hospital -BiIateral venous Dopplers negative for DVT -He initially appeared quite dyspneic with obvious increased work of breathing, clear lungs on auscultation, but he is not hypoxic, CTA chest ruled out PE, showed  small right pleural effusion and mild cardiomegaly. -started on toprol XL 6/19 by cardiology and increased to 150 mg daily 6/21 -spironolactone 12.5 mg daily started 6/21 -losartan 12.5 mg daily added prior to discharge -Pt has diuresed 7.5L since admission and feeling much better.  -His K remains borderline low at 3.5 will DC home on oral K 20 meq daily.   -DC home with close outpatient follow up and recheck labs outpatient BMP in 1 week with PCP and in 3 weeks with cardiology office.    Intake/Output Summary (Last 24 hours) at 08/02/2022 1029 Last data filed at 08/02/2022 0500 Gross per 24 hour  Intake 360 ml  Output 1500 ml  Net -1140 ml   Filed Weights   08/01/22 0500 08/01/22 1047 08/02/22 0500  Weight: 122.8 kg 122 kg 121.6 kg   ReDs Vest [place] on 6/21 was 38  History of recreational drug use --urine toxicology screen positive for cocaine use --consulted TOC to provide outpatient treatment resources --pt was STRONGLY ADVISED against using further recreational drugs   Aneurysm of ascending aorta (HCC) CT ED chest today-  Stable dilatation of the ascending aorta measuring 4.7 cm. Recommend semi-annual imaging followup by CTA or MRA and referral to cardiothoracic surgery if not already obtained. -Patient follows routinely with his vascular surgeon.  Hypokalemia -- working to replete Mg and K -- Mg improved to 2.2 -- additional oral potassium ordered and spironolactone started by cardiology 6/21 -- recommended pt to see PCP for BMP in 1 week and cardiology office in 3-4 weeks for BMP check    --he will DC home on oral K 20 meq daily   History of Alcohol withdrawal seizure with complication Reports no alcoholic beverage in the past 2 weeks due to SOB, but previously drank 3-4 beers twice a week. - Repleted electrolytes - CIWA was ordered, but NO ACUTE ALCOHOL WITHDRAWAL in hospital - Thiamine, folate, multivitamins  Essential hypertension -added low dose losartan 12.5 mg  daily   Discharge Diagnoses:  Principal Problem:   Atrial fibrillation with RVR Active Problems:   Acute HFrEF   Tachycardia induced cardiomyopathy   Essential hypertension   History of Alcohol withdrawal seizure with complication   Hypokalemia   Aneurysm of ascending aorta (HCC)   History of recreational drug use   Discharge Instructions: Discharge Instructions     Ambulatory referral to Cardiology   Complete by: As directed    Hospital Follow Up 2-3 weeks, needs OV and BMP lab      Allergies as of 08/02/2022   No Known Allergies      Medication List     STOP taking these medications    amLODipine 10 MG tablet Commonly known as: NORVASC   diclofenac 75 MG EC tablet Commonly known as: VOLTAREN   olmesartan 40 MG tablet Commonly known as: BENICAR   traZODone 100 MG tablet Commonly known as: DESYREL       TAKE these medications    apixaban 5 MG Tabs tablet Commonly known as: ELIQUIS Take 1 tablet (5 mg total) by mouth 2 (two) times daily.   folic acid 1 MG  tablet Commonly known as: FOLVITE Take 1 tablet (1 mg total) by mouth daily. Start taking on: August 03, 2022   furosemide 40 MG tablet Commonly known as: LASIX Take 1 tablet (40 mg total) by mouth daily. What changed: when to take this   losartan 25 MG tablet Commonly known as: COZAAR Take 0.5 tablets (12.5 mg total) by mouth daily.   metoprolol succinate 50 MG 24 hr tablet Commonly known as: TOPROL-XL Take 3 tablets (150 mg total) by mouth daily. Take with or immediately following a meal. Start taking on: August 03, 2022   multivitamin with minerals Tabs tablet Take 1 tablet by mouth daily. Start taking on: August 03, 2022   naloxone 4 MG/0.1ML Liqd nasal spray kit Commonly known as: NARCAN Spray into nostrils if patient shows signs of opioid overdose, then call 911   Oxycodone HCl 10 MG Tabs Take 10 mg by mouth every 4 (four) hours as needed (pain).   potassium chloride SA 20 MEQ  tablet Commonly known as: KLOR-CON M Take 1 tablet (20 mEq total) by mouth daily.   spironolactone 25 MG tablet Commonly known as: ALDACTONE Take 0.5 tablets (12.5 mg total) by mouth daily. Start taking on: August 03, 2022   thiamine 100 MG tablet Commonly known as: Vitamin B-1 Take 1 tablet (100 mg total) by mouth daily. Start taking on: August 03, 2022   Vitamin D (Ergocalciferol) 1.25 MG (50000 UNIT) Caps capsule Commonly known as: DRISDOL Take 50,000 Units by mouth once a week.        Follow-up Information     Elfredia Nevins, MD. Schedule an appointment as soon as possible for a visit in 1 week(s).   Specialty: Internal Medicine Why: Hospital Follow Up Contact information: [address] [place] [address] [phone]         [facility] Schedule an appointment as soon as possible for a visit in 3 week(s).   Specialty: Cardiology Why: Hospital Follow Up Contact information: [address] [id number] [place] [address] [phone]               No Known Allergies Allergies as of 08/02/2022   No Known Allergies      Medication List     STOP taking these medications    amLODipine 10 MG tablet Commonly known as: NORVASC   diclofenac 75 MG EC tablet Commonly known as: VOLTAREN   olmesartan 40 MG tablet Commonly known as: BENICAR   traZODone 100 MG tablet Commonly known as: DESYREL       TAKE these medications    apixaban 5 MG Tabs tablet Commonly known as: ELIQUIS Take 1 tablet (5 mg total) by mouth 2 (two) times daily.   folic acid 1 MG tablet Commonly known as: FOLVITE Take 1 tablet (1 mg total) by mouth daily. Start taking on: August 03, 2022   furosemide 40 MG tablet Commonly known as: LASIX Take 1 tablet (40 mg total) by mouth daily. What changed: when to take this   losartan 25 MG tablet Commonly known as: COZAAR Take 0.5 tablets (12.5 mg total) by mouth daily.    metoprolol succinate 50 MG 24 hr tablet Commonly known as: TOPROL-XL Take 3 tablets (150 mg total) by mouth daily. Take with or immediately following a meal. Start taking on: August 03, 2022   multivitamin with minerals Tabs tablet Take 1 tablet by mouth daily. Start taking on: August 03, 2022   naloxone 4 MG/0.1ML  Liqd nasal spray kit Commonly known as: NARCAN Spray into nostrils if patient shows signs of opioid overdose, then call 911   Oxycodone HCl 10 MG Tabs Take 10 mg by mouth every 4 (four) hours as needed (pain).   potassium chloride SA 20 MEQ tablet Commonly known as: KLOR-CON M Take 1 tablet (20 mEq total) by mouth daily.   spironolactone 25 MG tablet Commonly known as: ALDACTONE Take 0.5 tablets (12.5 mg total) by mouth daily. Start taking on: August 03, 2022   thiamine 100 MG tablet Commonly known as: Vitamin B-1 Take 1 tablet (100 mg total) by mouth daily. Start taking on: August 03, 2022   Vitamin D (Ergocalciferol) 1.25 MG (50000 UNIT) Caps capsule Commonly known as: DRISDOL Take 50,000 Units by mouth once a week.        Procedures/Studies: DG CHEST PORT 1 VIEW  Result Date: 08/01/2022 CLINICAL DATA:  Acute heart failure. Leg swelling and atrial fibrillation. EXAM: PORTABLE CHEST 1 VIEW COMPARISON:  07/29/2022. FINDINGS: The heart is enlarged and the mediastinal contour is within normal limits. Lung volumes are low with mild atelectasis at the left lung base. There is a small right pleural effusion. No pneumothorax is seen. No acute osseous abnormality. IMPRESSION: 1. Small right pleural effusion. 2. Cardiomegaly. Electronically Signed   By: Thornell Sartorius M.D.   On: 08/01/2022 04:58   ECHOCARDIOGRAM LIMITED  Result Date: 07/30/2022    ECHOCARDIOGRAM LIMITED REPORT   Patient Name:   Andrew Haley Date of Exam: 07/30/2022 Medical Rec #:  [id number]    Height:       75.0 in Accession #:    [id number]   Weight:       277.1 lb Date of Birth:  [birth year]   BSA:           2.521 m Patient Age:    56 years     BP:           134/93 mmHg Patient Gender: M            HR:           102 bpm. [facility] Procedure: Cardiac Doppler, Color Doppler and Limited Echo Indications:     Congestive Heart Failure I50.9  History:         Patient has prior history of Echocardiogram examinations, most                  recent 01/03/2022. Arrythmias:Atrial Fibrillation; Risk                  Factors:Hypertension. History of Alcohol withdrawal seizure                  with complication.  Sonographer:     Celesta Gentile RCS Referring Phys:  [id number] [facility] Diagnosing Phys: Nona Dell MD IMPRESSIONS  1. Limited study.  2. Left ventricular ejection fraction, by estimation, is 30 to 35%. The left ventricle has moderately decreased function. The left ventricle demonstrates global hypokinesis. The left ventricular internal cavity size was mildly dilated. There is mild concentric left ventricular hypertrophy. Left ventricular diastolic parameters are indeterminate.  3. No formed LV mural thrombus noted with Definity contrast.  4. RV-RA gradient 21 mmHg suggesting normal estimated RVSP in the setting of a normal CVP. Right ventricular systolic function is mildly reduced. The right ventricular size is normal.  5. Right atrial size was mildly dilated.  6. Aortic dilatation noted. There is mild  dilatation of the aortic root, measuring 41 mm.  7. Unable to estimate CVP.  8. The aortic valve was not well visualized, likely trileaflet. There is mild calcification of the aortic valve. Aortic valve regurgitation is not visualized. Aortic valve sclerosis/calcification is present, without any evidence of aortic stenosis. Comparison(s): Prior images reviewed side by side. FINDINGS  Left Ventricle: Left ventricular ejection fraction, by estimation, is 30 to 35%. The left ventricle has moderately decreased function. The left ventricle demonstrates global hypokinesis. Definity contrast agent was  given IV to delineate the left ventricular endocardial borders. The left ventricular internal cavity size was mildly dilated. There is mild concentric left ventricular hypertrophy. Left ventricular diastolic parameters are indeterminate. Left ventricular diastolic function could not be evaluated due to atrial fibrillation. Right Ventricle: RV-RA gradient 21 mmHg suggesting normal estimated RVSP in the setting of a normal CVP. The right ventricular size is normal. No increase in right ventricular wall thickness. Right ventricular systolic function is mildly reduced. Right Atrium: Right atrial size was mildly dilated. Pericardium: Trivial pericardial effusion is present. The pericardial effusion is posterior to the left ventricle. Tricuspid Valve: The tricuspid valve is grossly normal. Tricuspid valve regurgitation is mild. Aortic Valve: The aortic valve was not well visualized. There is mild calcification of the aortic valve. There is mild to moderate aortic valve annular calcification. Aortic valve regurgitation is not visualized. Aortic valve sclerosis/calcification is present, without any evidence of aortic stenosis. Pulmonic Valve: The pulmonic valve was grossly normal. Pulmonic valve regurgitation is trivial. Aorta: Aortic dilatation noted. There is mild dilatation of the aortic root, measuring 41 mm. Venous: Unable to estimate CVP. The inferior vena cava was not well visualized. IAS/Shunts: The interatrial septum was not well visualized. LEFT VENTRICLE PLAX 2D LVIDd:         6.10 cm LVIDs:         4.30 cm LV PW:         1.30 cm LV IVS:        1.20 cm LVOT diam:     2.20 cm LVOT Area:     3.80 cm  RIGHT VENTRICLE TAPSE (M-mode): 2.0 cm LEFT ATRIUM         Index LA diam:    5.10 cm 2.02 cm/m   AORTA Ao Root diam: 4.10 cm TRICUSPID VALVE TR Peak grad:   21.0 mmHg TR Vmax:        229.00 cm/s  SHUNTS Systemic Diam: 2.20 cm Nona Dell MD Electronically signed by Nona Dell MD Signature Date/Time:  07/30/2022/3:14:50 PM    Final (Updated)    CT Angio Chest Pulmonary Embolism (PE) W or WO Contrast  Result Date: 07/29/2022 CLINICAL DATA:  Increased work of breathing. EXAM: CT ANGIOGRAPHY CHEST WITH CONTRAST TECHNIQUE: Multidetector CT imaging of the chest was performed using the standard protocol during bolus administration of intravenous contrast. Multiplanar CT image reconstructions and MIPs were obtained to evaluate the vascular anatomy. RADIATION DOSE REDUCTION: This exam was performed according to the departmental dose-optimization program which includes automated exposure control, adjustment of the mA and/or kV according to patient size and/or use of iterative reconstruction technique. CONTRAST:  80mL OMNIPAQUE IOHEXOL 350 MG/ML SOLN COMPARISON:  CT angiogram chest 01/02/2022 FINDINGS: Cardiovascular: Ascending aorta is dilated measuring 4.7 cm similar to the prior study. Heart is mildly enlarged. There is no pericardial effusion. There is adequate opacification of the pulmonary arteries to the segmental level. There is no evidence for pulmonary embolism. Mediastinum/Nodes: Thyroid gland is mildly diffusely  enlarged with numerous nodules measuring up to 15 mm, similar to prior. There are no enlarged mediastinal or hilar lymph nodes. The esophagus is within normal limits. Lungs/Pleura: There is a small right pleural effusion. There is atelectasis in the bilateral lower lobes. Lungs are otherwise clear. There is no pneumothorax. Upper Abdomen: No acute abnormality. Musculoskeletal: No chest wall abnormality. No acute or significant osseous findings. Review of the MIP images confirms the above findings. IMPRESSION: 1. No evidence for pulmonary embolism. 2. Small right pleural effusion. 3. Mild cardiomegaly. 4. Stable dilatation of the ascending aorta measuring 4.7 cm. Recommend semi-annual imaging followup by CTA or MRA and referral to cardiothoracic surgery if not already obtained. This recommendation  follows 2010 ACCF/AHA/AATS/ACR/ASA/SCA/SCAI/SIR/STS/SVM Guidelines for the Diagnosis and Management of Patients With Thoracic Aortic Disease. Circulation. 2010; 121: G626-R48. Aortic aneurysm NOS (ICD10-I71.9) 5. Left Bosniak I benign renal cyst measuring 5.5 cm. No follow-up imaging is recommended. JACR 2018 Feb; 264-273, Management of the Incidental Renal Mass on CT, [employer] 2021; 814-848, Bosniak Classification of Cystic Renal Masses, Version 2019. Electronically Signed   By: Darliss Cheney M.D.   On: 07/29/2022 20:47   US Venous Img Lower Bilateral (DVT)  Result Date: 07/29/2022 CLINICAL DATA:  [id number] SOB (shortness of breath) [id number] EXAM: BILATERAL LOWER EXTREMITY VENOUS DOPPLER ULTRASOUND TECHNIQUE: Gray-scale sonography with graded compression, as well as color Doppler and duplex ultrasound were performed to evaluate the lower extremity deep venous systems from the level of the common femoral vein and including the common femoral, femoral, profunda femoral, popliteal and calf veins including the posterior tibial, peroneal and gastrocnemius veins when visible. The superficial great saphenous vein was also interrogated. Spectral Doppler was utilized to evaluate flow at rest and with distal augmentation maneuvers in the common femoral, femoral and popliteal veins. COMPARISON:  Chest XR, concurrent. FINDINGS: RIGHT LOWER EXTREMITY VENOUS Normal compressibility of the RIGHT common femoral, superficial femoral, and popliteal veins, as well as the visualized calf veins. Visualized portions of profunda femoral vein and great saphenous vein unremarkable. No filling defects to suggest DVT on grayscale or color Doppler imaging. Doppler waveforms show normal direction of venous flow, normal respiratory plasticity and response to augmentation. OTHER No evidence of superficial thrombophlebitis or abnormal fluid collection. Limitations: Patient body habitus LEFT LOWER EXTREMITY VENOUS Normal compressibility of the  LEFT common femoral, superficial femoral, and popliteal veins, as well as the visualized calf veins. Visualized portions of profunda femoral vein and great saphenous vein unremarkable. No filling defects to suggest DVT on grayscale or color Doppler imaging. Doppler waveforms show normal direction of venous flow, normal respiratory plasticity and response to augmentation. Limited views of the contralateral common femoral vein are unremarkable. OTHER No evidence of superficial thrombophlebitis or abnormal fluid collection. Limitations: Patient body habitus IMPRESSION: No evidence of femoropopliteal DVT or superficial thrombophlebitis within either lower extremity. Roanna Banning, MD [facility] Electronically Signed   By: Roanna Banning M.D.   On: 07/29/2022 17:04   DG Chest 2 View  Result Date: 07/29/2022 CLINICAL DATA:  SOB EXAM: CHEST - 2 VIEW COMPARISON:  Lower extremity DVT ultrasound, concurrent. Chest XR, 01/08/2022. CTA chest 01/02/2022. FINDINGS: Enlargement of the cardiac silhouette. Hypoinflation. No focal consolidation or mass. No pleural effusion or pneumothorax. No acute displaced fracture. IMPRESSION: Cardiomegaly and hypoinflation. No acute superimposed cardiopulmonary process. Electronically Signed   By: Roanna Banning M.D.   On: 07/29/2022 16:49     Subjective: Pt says he feels great, really pleased  that his edema has subsided especially in feet and legs, he is eager to discharge because he has another family member in the hospital in [place], [place].   Discharge Exam: Vitals:   08/01/22 2044 08/02/22 0428  BP: 101/70 136/89  Pulse: 96 91  Resp: 20 18  Temp: 97.8 F (36.6 C) 98.1 F (36.7 C)  SpO2: 98% 97%   Vitals:   08/01/22 1800 08/01/22 2044 08/02/22 0428 08/02/22 0500  BP: 112/89 101/70 136/89   Pulse:  96 91   Resp:  20 18   Temp: 97.7 F (36.5 C) 97.8 F (36.6 C) 98.1 F (36.7 C)   TempSrc: Oral Oral    SpO2: 100%  98% 97%   Weight:    121.6 kg  Height:       General: Pt is alert, awake, not in acute distress Cardiovascular: normal S1/S2 +, no rubs, no gallops Respiratory: CTA bilaterally, no wheezing, no rhonchi Abdominal: Soft, NT, ND, bowel sounds + Extremities: trace edema in feet and legs, no cyanosis   The results of significant diagnostics from this hospitalization (including imaging, microbiology, ancillary and laboratory) are listed below for reference.     Microbiology: Recent Results (from the past 240 hour(s))  MRSA Next Gen by PCR, Nasal     Status: None   Collection Time: 07/29/22  8:52 PM   Specimen: Nasal Mucosa; Nasal Swab  Result Value Ref Range Status   MRSA by PCR Next Gen NOT DETECTED NOT DETECTED Final    Comment: (NOTE) The GeneXpert MRSA Assay (FDA approved for NASAL specimens only), is one component of a comprehensive MRSA colonization surveillance program. It is not intended to diagnose MRSA infection nor to guide or monitor treatment for MRSA infections. Test performance is not FDA approved in patients less than 15 years old. Performed at [facility], [address], [place], [place] [address]      Labs: BNP (last 3 results) Recent Labs    07/29/22 1557 07/31/22 0446 08/01/22 0423  BNP 540.0* 294.0* 309.0*   Basic Metabolic Panel: Recent Labs  Lab 07/29/22 1557 07/29/22 2206 07/30/22 0427 07/31/22 0446 08/01/22 0423 08/02/22 0432  NA 134*  --  135 135 133* 136  K 3.2*  --  3.6 3.3* 3.1* 3.5  CL 96*  --  95* 93* 91* 95*  CO2 29  --  32 31 33* 33*  GLUCOSE 175*  --  95 107* 134* 106*  BUN 16  --  16 17 21* 23*  CREATININE 0.85  --  0.74 0.75 0.89 0.90  CALCIUM 8.7*  --  8.5* 8.6* 8.4* 8.3*  MG 1.6*  --  2.0 1.8 2.0  --   PHOS  --  4.5  --   --  4.0  --    Liver Function Tests: No results for input(s): "AST", "ALT", "ALKPHOS", "BILITOT", "PROT", "ALBUMIN" in the last 168 hours. No results for input(s): "LIPASE", "AMYLASE" in the last 168  hours. No results for input(s): "AMMONIA" in the last 168 hours. CBC: Recent Labs  Lab 07/29/22 1557 07/30/22 0427 07/31/22 0446 08/01/22 0423 08/02/22 0432  WBC 10.5 8.3 9.1 8.3 5.9  HGB 10.6* 9.8* 10.7* 10.8* 10.6*  HCT 34.6* 32.4* 35.5* 35.5* 34.8*  MCV 81.2 81.8 81.4 81.4 81.5  PLT 261 221 244 303 282   Cardiac Enzymes: No results for input(s): "CKTOTAL", "CKMB", "CKMBINDEX", "TROPONINI" in the last 168 hours. BNP: Invalid input(s): "POCBNP" CBG: No results for input(s): "GLUCAP" in the  last 168 hours. D-Dimer No results for input(s): "DDIMER" in the last 72 hours. Hgb A1c No results for input(s): "HGBA1C" in the last 72 hours. Lipid Profile No results for input(s): "CHOL", "HDL", "LDLCALC", "TRIG", "CHOLHDL", "LDLDIRECT" in the last 72 hours. Thyroid function studies No results for input(s): "TSH", "T4TOTAL", "T3FREE", "THYROIDAB" in the last 72 hours.  Invalid input(s): "FREET3" Anemia work up No results for input(s): "VITAMINB12", "FOLATE", "FERRITIN", "TIBC", "IRON", "RETICCTPCT" in the last 72 hours. Urinalysis    Component Value Date/Time   COLORURINE YELLOW 01/01/2022 2115   APPEARANCEUR HAZY (A) 01/01/2022 2115   LABSPEC >1.046 (H) 01/01/2022 2115   PHURINE 5.0 01/01/2022 2115   GLUCOSEU NEGATIVE 01/01/2022 2115   HGBUR NEGATIVE 01/01/2022 2115   BILIRUBINUR NEGATIVE 01/01/2022 2115   KETONESUR NEGATIVE 01/01/2022 2115   PROTEINUR 30 (A) 01/01/2022 2115   UROBILINOGEN 1.0 05/25/2014 0822   NITRITE NEGATIVE 01/01/2022 2115   LEUKOCYTESUR NEGATIVE 01/01/2022 2115   Sepsis Labs Recent Labs  Lab 07/30/22 0427 07/31/22 0446 08/01/22 0423 08/02/22 0432  WBC 8.3 9.1 8.3 5.9   Microbiology Recent Results (from the past 240 hour(s))  MRSA Next Gen by PCR, Nasal     Status: None   Collection Time: 07/29/22  8:52 PM   Specimen: Nasal Mucosa; Nasal Swab  Result Value Ref Range Status   MRSA by PCR Next Gen NOT DETECTED NOT DETECTED Final    Comment:  (NOTE) The GeneXpert MRSA Assay (FDA approved for NASAL specimens only), is one component of a comprehensive MRSA colonization surveillance program. It is not intended to diagnose MRSA infection nor to guide or monitor treatment for MRSA infections. Test performance is not FDA approved in patients less than 81 years old. Performed at [facility], [address], [place], [place] [address]    Time coordinating discharge: 44 mins   SIGNED:  Standley Dakins, MD  [facility] 08/02/2022, 10:45 AM How to contact the [facility] Attending or Consulting provider 7A - 7P or covering provider during after hours 7P -7A, for this patient?  Check the care team in [facility] and look for a) attending/consulting [facility] provider listed and b) the [facility] team listed Log into [email or web address] and use [place]'s universal password to access. If you do not have the password, please contact the hospital operator. Locate the [facility] provider you are looking for under [facility] and page to a number that you can be directly reached. If you still have difficulty reaching the provider, please page the [facility] (Director on Call) for the Hospitalists listed on amion for assistance.

## 2022-08-02 NOTE — Discharge Instructions (Addendum)
PLEASE  HAVE YOUR LABS CHECKED IN 1 WEEK WITH PRIMARY CARE PROVIDER AND THEN IN 3 WEEKS WITH CARDIOLOGY OFFICE   IMPORTANT INFORMATION: PAY CLOSE ATTENTION   PHYSICIAN DISCHARGE INSTRUCTIONS  Follow with Primary care provider  Andrew Nevins, MD  and other consultants as instructed by your Hospitalist Physician  SEEK MEDICAL CARE OR RETURN TO EMERGENCY ROOM IF SYMPTOMS COME BACK, WORSEN OR NEW PROBLEM DEVELOPS   Please note: You were cared for by a hospitalist during your hospital stay. Every effort will be made to forward records to your primary care provider.  You can request that your primary care provider send for your hospital records if they have not received them.  Once you are discharged, your primary care physician will handle any further medical issues. Please note that NO REFILLS for any discharge medications will be authorized once you are discharged, as it is imperative that you return to your primary care physician (or establish a relationship with a primary care physician if you do not have one) for your post hospital discharge needs so that they can reassess your need for medications and monitor your lab values.  Please get a complete blood count and chemistry panel checked by your Primary MD at your next visit, and again as instructed by your Primary MD.  Get Medicines reviewed and adjusted: Please take all your medications with you for your next visit with your Primary MD  Laboratory/radiological data: Please request your Primary MD to go over all hospital tests and procedure/radiological results at the follow up, please ask your primary care provider to get all Hospital records sent to his/her office.  In some cases, they will be blood work, cultures and biopsy results pending at the time of your discharge. Please request that your primary care provider follow up on these results.  If you are diabetic, please bring your blood sugar readings with you to your follow up  appointment with primary care.    Please call and make your follow up appointments as soon as possible.    Also Note the following: If you experience worsening of your admission symptoms, develop shortness of breath, life threatening emergency, suicidal or homicidal thoughts you must seek medical attention immediately by calling 911 or calling your MD immediately  if symptoms less severe.  You must read complete instructions/literature along with all the possible adverse reactions/side effects for all the Medicines you take and that have been prescribed to you. Take any new Medicines after you have completely understood and accpet all the possible adverse reactions/side effects.   Do not drive when taking Pain medications or sleeping medications (Benzodiazepines)  Do not take more than prescribed Pain, Sleep and Anxiety Medications. It is not advisable to combine anxiety,sleep and pain medications without talking with your primary care practitioner  Special Instructions: If you have smoked or chewed Tobacco  in the last 2 yrs please stop smoking, stop any regular Alcohol  and or any Recreational drug use.  Wear Seat belts while driving.  Do not drive if taking any narcotic, mind altering or controlled substances or recreational drugs or alcohol.

## 2022-08-04 NOTE — Consult Note (Signed)
[facility] Customer service manager [facility]) Accountable Care Organization (ACO) [facility] Liaison Note  08/04/2022  Andrew Haley [birth year] [id number]  Location: [facility] RN Hospital Liaison screened the patient remotely at [facility].  Insurance: Andrew Haley is a 57 y.o. male who is a Primary Care Patient of Elfredia Nevins, MD. The patient was screened for  readmission hospitalization with noted medium risk score for unplanned readmission risk with 1 IP in 6 months.  The patient was assessed for potential [facility]) Care Management service needs for post hospital transition for care coordination. Review of patient's electronic medical record reveals patient was admitted for Atrial Fibrillation. Pt discharged home with no anticipated needs at this time.  Plan: [facility] Liaison will continue to follow progress and disposition to asess for post hospital community care coordination/management needs.  Referral request for community care coordination: anticipate [facility] Transitions of Care Team follow up.   [facility] Care Management/Population Health does not replace or interfere with any arrangements made by the Inpatient Transition of Care team.   For questions contact:   Elliot Cousin, RN, BSN [facility] Liaison [place]   [facility]  Population Health Office Hours MTWF  8:00 am-6:00 pm Off on Thursday [phone] mobile [phone] [Office toll free line][facility] Office Hours are M-F 8:30 - 5 pm 24 hour nurse advise line [phone] Concierge  [email or web address]

## 2022-08-08 DIAGNOSIS — C61 Malignant neoplasm of prostate: Secondary | ICD-10-CM | POA: Diagnosis not present

## 2022-08-08 DIAGNOSIS — F14188 Cocaine abuse with other cocaine-induced disorder: Secondary | ICD-10-CM | POA: Diagnosis not present

## 2022-08-08 DIAGNOSIS — I4891 Unspecified atrial fibrillation: Secondary | ICD-10-CM | POA: Diagnosis not present

## 2022-08-08 DIAGNOSIS — I712 Thoracic aortic aneurysm, without rupture, unspecified: Secondary | ICD-10-CM | POA: Diagnosis not present

## 2022-08-08 DIAGNOSIS — E6609 Other obesity due to excess calories: Secondary | ICD-10-CM | POA: Diagnosis not present

## 2022-08-08 DIAGNOSIS — M1991 Primary osteoarthritis, unspecified site: Secondary | ICD-10-CM | POA: Diagnosis not present

## 2022-08-08 DIAGNOSIS — G894 Chronic pain syndrome: Secondary | ICD-10-CM | POA: Diagnosis not present

## 2022-08-08 DIAGNOSIS — I719 Aortic aneurysm of unspecified site, without rupture: Secondary | ICD-10-CM | POA: Diagnosis not present

## 2022-08-08 DIAGNOSIS — I10 Essential (primary) hypertension: Secondary | ICD-10-CM | POA: Diagnosis not present

## 2022-09-09 ENCOUNTER — Ambulatory Visit: Payer: Medicare HMO | Attending: Internal Medicine | Admitting: Internal Medicine

## 2022-09-09 ENCOUNTER — Encounter: Payer: Self-pay | Admitting: Internal Medicine

## 2022-09-09 VITALS — BP 124/82 | HR 86 | Ht 75.0 in | Wt 238.0 lb

## 2022-09-09 DIAGNOSIS — I43 Cardiomyopathy in diseases classified elsewhere: Secondary | ICD-10-CM | POA: Diagnosis not present

## 2022-09-09 DIAGNOSIS — I4819 Other persistent atrial fibrillation: Secondary | ICD-10-CM

## 2022-09-09 DIAGNOSIS — R000 Tachycardia, unspecified: Secondary | ICD-10-CM | POA: Diagnosis not present

## 2022-09-09 DIAGNOSIS — I4891 Unspecified atrial fibrillation: Secondary | ICD-10-CM

## 2022-09-09 DIAGNOSIS — I428 Other cardiomyopathies: Secondary | ICD-10-CM

## 2022-09-09 DIAGNOSIS — F1010 Alcohol abuse, uncomplicated: Secondary | ICD-10-CM | POA: Diagnosis not present

## 2022-09-09 NOTE — Progress Notes (Signed)
Cardiology Office Note  Date: 09/09/2022   ID: LONZO Haley, DOB 09/29/1965, MRN [id number]  PCP:  Elfredia Nevins, MD  Cardiologist:  Marjo Bicker, MD Electrophysiologist:  None   Reason for Office Visit: Posthospitalization follow-up visit   History of Present Illness: Andrew Haley is a 57 y.o. 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   Patient was admitted to [facility] in 6/24 with ADHF and found to have incidental diagnosis of new onset cardiomyopathy with LVEF 30 to 35%.  EKG showed new onset atrial fibrillation with RVR.  Patient was diuresed and discharged in stable condition.  He is here for follow-up visit, no symptoms, overall doing great.  No DOE, orthopnea, PND, leg swelling.  No palpitations.  No angina.  Current alcohol use.  Past Medical History:  Diagnosis Date   Arthritis    Class 1 obesity 07/18/2020   Complication of anesthesia    slow to wake up   Essential hypertension 07/18/2020   Headache    Hypertension    Hypertriglyceridemia    Numbness of fingers of both hands    Prostate cancer (HCC) 2017   Sleep apnea    CPAP    Past Surgical History:  Procedure Laterality Date   ANKLE SURGERY Left    COLONOSCOPY WITH PROPOFOL N/A 09/28/2017   Procedure: COLONOSCOPY WITH PROPOFOL;  Surgeon: Corbin Ade, MD;  [facility]: [facility];  Service: Endoscopy;  Laterality: N/A;  12:00pm   HAND SURGERY Right 2003   cysts    JOINT REPLACEMENT     LESION REMOVAL Left 09/23/2013   Procedure: MINOR EXCISION 3 CM SKIN LESION OF LEFT THIGH ;  Surgeon: Dalia Heading, MD;  Location: [facility] ORS;  Service: General;  Laterality: Left;   LYMPHADENECTOMY Bilateral 11/29/2015   Procedure: LYMPHADENECTOMY;  Surgeon: Heloise Purpura, MD;  Location: [facility] ORS;  Service: Urology;  Laterality: Bilateral;   ROBOT ASSISTED LAPAROSCOPIC RADICAL PROSTATECTOMY N/A 11/29/2015    Procedure: XI ROBOTIC ASSISTED LAPAROSCOPIC RADICAL PROSTATECTOMY LEVEL 3;  Surgeon: Heloise Purpura, MD;  Location: [facility] ORS;  Service: Urology;  Laterality: N/A;   TOTAL KNEE ARTHROPLASTY Left 02/22/2014   Procedure: LEFT TOTAL KNEE ARTHROPLASTY;  Surgeon: Jacki Cones, MD;  Location: [facility] ORS;  Service: Orthopedics;  Laterality: Left;   TOTAL KNEE ARTHROPLASTY Right 05/23/2014   Procedure: RIGHT TOTAL KNEE ARTHROPLASTY;  Surgeon: Ranee Gosselin, MD;  Location: [facility] ORS;  Service: Orthopedics;  Laterality: Right;   TOTAL KNEE REVISION Right 03/04/2021   Procedure: TOTAL KNEE REVISION;  Surgeon: Durene Romans, MD;  Location: [facility] ORS;  Service: Orthopedics;  Laterality: Right;    Current Outpatient Medications  Medication Sig Dispense Refill   apixaban (ELIQUIS) 5 MG TABS tablet Take 1 tablet (5 mg total) by mouth 2 (two) times daily. 60 tablet 2   folic acid (FOLVITE) 1 MG tablet Take 1 tablet (1 mg total) by mouth daily. 30 tablet 1   furosemide (LASIX) 40 MG tablet Take 1 tablet (40 mg total) by mouth daily. 30 tablet 2   losartan (COZAAR) 25 MG tablet Take 0.5 tablets (12.5 mg total) by mouth daily. 15 tablet 2   metoprolol succinate (TOPROL-XL) 50 MG 24 hr tablet Take 3 tablets (150 mg total) by mouth daily. Take with or immediately following a meal. 90 tablet 2   Multiple Vitamin (MULTIVITAMIN WITH MINERALS) TABS tablet Take 1 tablet by mouth daily.  naloxone (NARCAN) nasal spray 4 mg/0.1 mL Spray into nostrils if patient shows signs of opioid overdose, then call 911 1 each 0   potassium chloride SA (KLOR-CON M) 20 MEQ tablet Take 1 tablet (20 mEq total) by mouth daily. 30 tablet 2   spironolactone (ALDACTONE) 25 MG tablet Take 0.5 tablets (12.5 mg total) by mouth daily. 15 tablet 2   thiamine (VITAMIN B-1) 100 MG tablet Take 1 tablet (100 mg total) by mouth daily. 30 tablet 1   Vitamin D, Ergocalciferol, (DRISDOL) 1.25 MG (50000 UNIT) CAPS capsule Take 50,000 Units by mouth once a week.      Oxycodone HCl 10 MG TABS Take 10 mg by mouth every 4 (four) hours as needed (pain). (Patient not taking: Reported on 09/09/2022)     No current facility-administered medications for this visit.   Allergies:  Patient has no known allergies.   Social History: The patient  reports that he has never smoked. He has never used smokeless tobacco. He reports current alcohol use. He reports that he does not currently use drugs after having used the following drugs: Cocaine.   Family History: The patient's family history includes Heart disease in his mother; Throat cancer in his father.   ROS:  Please see the history of present illness. Otherwise, complete review of systems is positive for none  All other systems are reviewed and negative.   Physical Exam: VS:  BP 124/82   Pulse 86   Ht 6\' 3"  (1.905 m)   Wt 238 lb (108 kg)   SpO2 96%   BMI 29.75 kg/m , BMI Body mass index is 29.75 kg/m.  Wt Readings from Last 3 Encounters:  09/09/22 238 lb (108 kg)  08/02/22 268 lb 1.3 oz (121.6 kg)  03/05/22 250 lb (113.4 kg)    General: Patient appears comfortable at rest. HEENT: Conjunctiva and lids normal, oropharynx clear with moist mucosa. Neck: Supple, no elevated JVP or carotid bruits, no thyromegaly. Lungs: Clear to auscultation, nonlabored breathing at rest. Cardiac: Regular rate and rhythm, no S3 or significant systolic murmur, no pericardial rub. Abdomen: Soft, nontender, no hepatomegaly, bowel sounds present, no guarding or rebound. Extremities: No pitting edema, distal pulses 2+. Skin: Warm and dry. Musculoskeletal: No kyphosis. Neuropsychiatric: Alert and oriented x3, affect grossly appropriate.  Recent Labwork: 01/08/2022: ALT 24; AST 23 07/29/2022: TSH 1.547 08/01/2022: B Natriuretic Peptide 309.0; Magnesium 2.0 08/02/2022: BUN 23; Creatinine, Ser 0.90; Hemoglobin 10.6; Platelets 282; Potassium 3.5; Sodium 136     Component Value Date/Time   TRIG 60 01/03/2022 0500     Assessment  and Plan:  # Tachycardia induced cardiomyopathy versus alcohol induced cardiomyopathy, LVEF 30 to 35% # Persistent atrial fibrillation -EKG today showed A-fib with HR 99 bpm. Has no symptoms of DOE, orthopnea, PND or leg swelling and no palpitations either.  Due to persistent atrial fibrillation and cardiomyopathy with LVEF 30 to 35% (likely tachycardia induced), patient will benefit from rhythm control strategy with DCCV. He has not missed any doses of systemic AC in the last 3 weeks and instructed him not to miss any doses further.  Repeat limited echocardiogram in 3 weeks after DCCV to evaluate for improvement in LVEF. If LVEF is not improved by then, we will pursue ischemia evaluation with CT cardiac. -For persistent A-fib, continue metoprolol succinate 150 mg once daily and Eliquis 5 mg twice daily. -For tachycardia induced cardiomyopathy, continue GDMT with Lasix 40 mg once daily, metoprolol succinate 150 mg once daily, losartan 12.5  mg once daily and spironolactone 12.5 mg once daily. -Current alcohol use, counseling.  Informed consent for DCCV The risks, benefits and alternatives for the procedure were discussed and the patient comprehended these risks. Risks include, but are not limited to, chest soreness, aspiration, cardiac arrest, possibility of PPM implantation and unsuccessful cardioversion.   I have spent a total of 30 minutes with patient reviewing chart, EKGs, labs and examining patient as well as establishing an assessment and plan that was discussed with the patient.  > 50% of time was spent in direct patient care.    Medication Adjustments/Labs and Tests Ordered: Current medicines are reviewed at length with the patient today.  Concerns regarding medicines are outlined above.   Tests Ordered: Orders Placed This Encounter  Procedures   EKG 12-Lead   ECHOCARDIOGRAM LIMITED    Medication Changes: No orders of the defined types were placed in this  encounter.   Disposition:  Follow up  6 weeks  Signed Kaiven Vester Verne Spurr, MD, 09/09/2022 3:43 PM    [facility] at [facility] [address] [place], [place], [place] [address]

## 2022-09-09 NOTE — Patient Instructions (Addendum)
Medication Instructions:  Your physician recommends that you continue on your current medications as directed. Please refer to the Current Medication list given to you today.   Labwork: none  Testing/Procedures: Your physician has requested that you have an echocardiogram. Echocardiography is a painless test that uses sound waves to create images of your heart. It provides your doctor with information about the size and shape of your heart and how well your heart's chambers and valves are working. This procedure takes approximately one hour. There are no restrictions for this procedure. Please do NOT wear cologne, perfume, aftershave, or lotions (deodorant is allowed). Please arrive 15 minutes prior to your appointment time.  Your physician has recommended that you have a Cardioversion (DCCV). Electrical Cardioversion uses a jolt of electricity to your heart either through paddles or wired patches attached to your chest. This is a controlled, usually prescheduled, procedure. Defibrillation is done under light anesthesia in the hospital, and you usually go home the day of the procedure. This is done to get your heart back into a normal rhythm. You are not awake for the procedure. Please see the instruction sheet given to you today.   Follow-Up: Your physician recommends that you schedule a follow-up appointment in: 6 weeks  Any Other Special Instructions Will Be Listed Below (If Applicable).  If you need a refill on your cardiac medications before your next appointment, please call your pharmacy.

## 2022-09-10 NOTE — Patient Instructions (Signed)
Andrew Haley  09/10/2022     @PREFPERIOPPHARMACY @   Your procedure is scheduled on  09/16/2022.   Report to [facility] at  1110  A.M.   Call this number if you have problems the morning of surgery:  [phone]  If you experience any cold or flu symptoms such as cough, fever, chills, shortness of breath, etc. between now and your scheduled surgery, please notify us at the above number.   Remember:  Do not eat or drink after midnight.        DO NOT miss any doses of your eliquis before your procedure.     Take these medicines the morning of surgery with A SIP OF WATER                                                 None.     Do not wear jewelry, make-up or nail polish, including gel polish,  artificial nails, or any other type of covering on natural nails (fingers and  toes).  Do not wear lotions, powders, or perfumes, or deodorant.  Do not shave 48 hours prior to surgery.  Men may shave face and neck.  Do not bring valuables to the hospital.  [facility] is not responsible for any belongings or valuables.  Contacts, dentures or bridgework may not be worn into surgery.  Leave your suitcase in the car.  After surgery it may be brought to your room.  For patients admitted to the hospital, discharge time will be determined by your treatment team.  Patients discharged the day of surgery will not be allowed to drive home and must have someone with them for 24 hours.    Special instructions:   DO NOT smoke tobacco or vape for 24 hours before your procedure.  Please read over the following fact sheets that you were given. Anesthesia Post-op Instructions and Care and Recovery After Surgery      Electrical Cardioversion Electrical cardioversion is the delivery of a jolt of electricity to restore a normal rhythm to the heart. A rhythm that is too fast or is not regular (arrhythmia) keeps the heart from pumping blood well. There is also another type of cardioversion  called a chemical (pharmacologic) cardioversion. This is when your health care provider gives you one or more medicines to bring back your regular heart rhythm. Electrical cardioversion is done as a scheduled procedure for arrhythmiasthat are not life-threatening. Electrical cardioversion may also be done in an emergency for sudden life-threatening arrhythmias. Tell a health care provider about: Any allergies you have. All medicines you are taking, including vitamins, herbs, eye drops, creams, and over-the-counter medicines. Any problems you or family members have had with sedatives or anesthesia. Any bleeding problems you have. Any surgeries you have had, including a pacemaker, defibrillator, or other implanted device. Any medical conditions you have. Whether you are pregnant or may be pregnant. What are the risks? Your provider will talk with you about risks. These include: Allergic reactions to medicines. Irritation to the skin on your chest or back where the sticky pads (electrodes) or paddles were put during electrical cardioversion. A blood clot that breaks free and travels to other parts of your body, such as your brain. Return of a worse abnormal heart rhythm that will need to be treated with  medicines, a pacemaker, or an implantable cardioverter defibrillator (ICD). What happens before the procedure? Medicines Your provider may give you: Blood-thinning medicines (anticoagulants) so your blood does not clot as easily. If your provider gives you this medicine, you may need to take it for 4 weeks before the procedure. Medicines to help stabilize your heart rate and rhythm. Ask your provider about: Changing or stopping your regular medicines. These include any diabetes medicines or blood thinners you take. Taking medicines such as aspirin and ibuprofen. These medicines can thin your blood. Do not take them unless your provider tells you to. Taking over-the-counter medicines, vitamins,  herbs, and supplements. General instructions Follow instructions from your provider about what you may eat and drink. Do not put any lotions, powders, or ointments on your chest and back for 24 hours before the procedure. They can cause problems with the electrodes or paddles used to deliver electricity to your heart. Do not wear jewelry as this can interfere with delivering electricity to your heart. If you will be going home right after the procedure, plan to have a responsible adult: Take you home from the hospital or clinic. You will not be allowed to drive. Care for you for the time you are told. Tests You may have an exam or testing. This may include: Blood labs. A transesophageal echocardiogram (TEE). What happens during the procedure?     An IV will be inserted into one of your veins. You will be given a sedative. This helps you relax. Electrodes or metal paddles will be placed on your chest. They may be placed in one of these ways: One placed on your right chest, the other on the left ribs. One placed on your chest and the other on your back. An electrical shock will be delivered. The shock briefly stops (resets) your heart rhythm. Your provider will check to see if your heart rhythm is now normal. Some people need only one shock. Some need more to restore a normal heart rhythm. The procedure may vary among providers and hospitals. What happens after the procedure? Your blood pressure, heart rate, breathing rate, and blood oxygen level will be monitored until you leave the hospital or clinic. Your heart rhythm will be watched to make sure it does not change. This information is not intended to replace advice given to you by your health care provider. Make sure you discuss any questions you have with your health care provider. Document Revised: 09/19/2021 Document Reviewed: 09/19/2021 Elsevier Patient Education  2024 [employer]. Monitored Anesthesia Care, Care After The  following information offers guidance on how to care for yourself after your procedure. Your health care provider may also give you more specific instructions. If you have problems or questions, contact your health care provider. What can I expect after the procedure? After the procedure, it is common to have: Tiredness. Little or no memory about what happened during or after the procedure. Impaired judgment when it comes to making decisions. Nausea or vomiting. Some trouble with balance. Follow these instructions at home: For the time period you were told by your health care provider:  Rest. Do not participate in activities where you could fall or become injured. Do not drive or use machinery. Do not drink alcohol. Do not take sleeping pills or medicines that cause drowsiness. Do not make important decisions or sign legal documents. Do not take care of children on your own. Medicines Take over-the-counter and prescription medicines only as told by your health care provider.  If you were prescribed antibiotics, take them as told by your health care provider. Do not stop using the antibiotic even if you start to feel better. Eating and drinking Follow instructions from your health care provider about what you may eat and drink. Drink enough fluid to keep your urine pale yellow. If you vomit: Drink clear fluids slowly and in small amounts as you are able. Clear fluids include water, ice chips, low-calorie sports drinks, and fruit juice that has water added to it (diluted fruit juice). Eat light and bland foods in small amounts as you are able. These foods include bananas, applesauce, rice, lean meats, toast, and crackers. General instructions  Have a responsible adult stay with you for the time you are told. It is important to have someone help care for you until you are awake and alert. If you have sleep apnea, surgery and some medicines can increase your risk for breathing problems.  Follow instructions from your health care provider about wearing your sleep device: When you are sleeping. This includes during daytime naps. While taking prescription pain medicines, sleeping medicines, or medicines that make you drowsy. Do not use any products that contain nicotine or tobacco. These products include cigarettes, chewing tobacco, and vaping devices, such as e-cigarettes. If you need help quitting, ask your health care provider. Contact a health care provider if: You feel nauseous or vomit every time you eat or drink. You feel light-headed. You are still sleepy or having trouble with balance after 24 hours. You get a rash. You have a fever. You have redness or swelling around the IV site. Get help right away if: You have trouble breathing. You have new confusion after you get home. These symptoms may be an emergency. Get help right away. Call 911. Do not wait to see if the symptoms will go away. Do not drive yourself to the hospital. This information is not intended to replace advice given to you by your health care provider. Make sure you discuss any questions you have with your health care provider. Document Revised: 06/24/2021 Document Reviewed: 06/24/2021 Elsevier Patient Education  2024 [employer].

## 2022-09-12 ENCOUNTER — Encounter (HOSPITAL_COMMUNITY): Payer: Self-pay

## 2022-09-12 ENCOUNTER — Other Ambulatory Visit: Payer: Self-pay

## 2022-09-12 ENCOUNTER — Encounter (HOSPITAL_COMMUNITY)
Admission: RE | Admit: 2022-09-12 | Discharge: 2022-09-12 | Disposition: A | Discharge status: Home / Self Care | Payer: Medicare HMO | Source: Ambulatory Visit | Attending: Internal Medicine | Admitting: Internal Medicine

## 2022-09-12 VITALS — BP 124/82 | HR 86 | Temp 97.8°F | Resp 18 | Ht 75.0 in | Wt 238.0 lb

## 2022-09-12 DIAGNOSIS — E876 Hypokalemia: Secondary | ICD-10-CM | POA: Insufficient documentation

## 2022-09-12 DIAGNOSIS — D649 Anemia, unspecified: Secondary | ICD-10-CM | POA: Insufficient documentation

## 2022-09-12 DIAGNOSIS — Z01812 Encounter for preprocedural laboratory examination: Secondary | ICD-10-CM | POA: Diagnosis not present

## 2022-09-12 DIAGNOSIS — F1411 Cocaine abuse, in remission: Secondary | ICD-10-CM | POA: Diagnosis not present

## 2022-09-12 DIAGNOSIS — I4819 Other persistent atrial fibrillation: Secondary | ICD-10-CM | POA: Insufficient documentation

## 2022-09-12 DIAGNOSIS — F1010 Alcohol abuse, uncomplicated: Secondary | ICD-10-CM

## 2022-09-12 DIAGNOSIS — F1991 Other psychoactive substance use, unspecified, in remission: Secondary | ICD-10-CM

## 2022-09-12 HISTORY — DX: Cardiac arrhythmia, unspecified: I49.9

## 2022-09-12 LAB — BASIC METABOLIC PANEL
Anion gap: 11 (ref 5–15)
BUN: 25 mg/dL — ABNORMAL HIGH (ref 6–20)
CO2: 22 mmol/L (ref 22–32)
Calcium: 8.3 mg/dL — ABNORMAL LOW (ref 8.9–10.3)
Chloride: 100 mmol/L (ref 98–111)
Creatinine, Ser: 1.18 mg/dL (ref 0.61–1.24)
GFR, Estimated: >60 mL/min (ref >60)
Glucose, Bld: 111 mg/dL — ABNORMAL HIGH (ref 70–99)
Potassium: 3.9 mmol/L (ref 3.5–5.1)
Sodium: 133 mmol/L — ABNORMAL LOW (ref 135–145)

## 2022-09-12 LAB — CBC WITH DIFFERENTIAL/PLATELET
Abs Immature Granulocytes: 0.01 10*3/uL (ref 0.00–0.07)
Basophils Absolute: 0 10*3/uL (ref 0.0–0.1)
Basophils Relative: 1 %
Eosinophils Absolute: 0.4 10*3/uL (ref 0.0–0.5)
Eosinophils Relative: 7 %
HCT: 40 % (ref 39.0–52.0)
Hemoglobin: 12.3 g/dL — ABNORMAL LOW (ref 13.0–17.0)
Immature Granulocytes: 0 %
Lymphocytes Relative: 33 %
Lymphs Abs: 1.9 10*3/uL (ref 0.7–4.0)
MCH: 24.6 pg — ABNORMAL LOW (ref 26.0–34.0)
MCHC: 30.8 g/dL (ref 30.0–36.0)
MCV: 80 fL (ref 80.0–100.0)
Monocytes Absolute: 0.7 10*3/uL (ref 0.1–1.0)
Monocytes Relative: 11 %
Neutro Abs: 2.8 10*3/uL (ref 1.7–7.7)
Neutrophils Relative %: 48 %
Platelets: 290 10*3/uL (ref 150–400)
RBC: 5 MIL/uL (ref 4.22–5.81)
RDW: 15.8 % — ABNORMAL HIGH (ref 11.5–15.5)
WBC: 5.9 10*3/uL (ref 4.0–10.5)
nRBC: 0 % (ref 0.0–0.2)

## 2022-09-12 LAB — RAPID URINE DRUG SCREEN, HOSP PERFORMED
Amphetamines: NOT DETECTED
Barbiturates: NOT DETECTED
Benzodiazepines: NOT DETECTED
Cocaine: POSITIVE — AB
Opiates: NOT DETECTED
Tetrahydrocannabinol: NOT DETECTED

## 2022-09-12 NOTE — Progress Notes (Signed)
Preop UDS positive for cocaine.  Dr. Johnnette Litter aware & cancelled procedure.  Dr. Mikal Plane aware.  Pt. Called & informed & instructed pt.to not use anymore & reschedule procedure with doctor's office. Pt. verbalized understanding.

## 2022-09-16 ENCOUNTER — Ambulatory Visit (HOSPITAL_COMMUNITY): Admission: RE | Admit: 2022-09-16 | Payer: Medicare HMO | Source: Home / Self Care | Admitting: Internal Medicine

## 2022-09-16 ENCOUNTER — Encounter (HOSPITAL_COMMUNITY): Admission: RE | Payer: Self-pay | Source: Home / Self Care

## 2022-09-16 DIAGNOSIS — I43 Cardiomyopathy in diseases classified elsewhere: Secondary | ICD-10-CM

## 2022-09-16 DIAGNOSIS — F1991 Other psychoactive substance use, unspecified, in remission: Secondary | ICD-10-CM

## 2022-09-16 DIAGNOSIS — I4819 Other persistent atrial fibrillation: Secondary | ICD-10-CM

## 2022-09-16 SURGERY — CARDIOVERSION
Anesthesia: Monitor Anesthesia Care

## 2022-10-01 ENCOUNTER — Telehealth: Payer: Self-pay | Admitting: Internal Medicine

## 2022-10-01 NOTE — Telephone Encounter (Signed)
Andrew P Mallipeddi, MD 09/19/2022  9:00 AM EDT     Urine toxicology was positive for cocaine and hence cardioversion had to be canceled. Strongly recommend cocaine cessation. I would not recommend any procedures (including ischemia evaluation or DCCV), moving forward due to consistent cocaine abuse.  Keep appointment with me on 10/21/2022.

## 2022-10-01 NOTE — Telephone Encounter (Signed)
Spoke to pt who verbalized understanding. Patient stated that he would keep his 8/27 appt for Echocardiogram. Pt will also keep f/u appt with MD on 9/10. Pt had no further questions at this time.

## 2022-10-01 NOTE — Telephone Encounter (Signed)
Patient stated he had lab tests done and due to his results his procedure was canceled.  Patient wants to know next steps as he has an Echocardiogram scheduled on 8/27.

## 2022-10-07 ENCOUNTER — Other Ambulatory Visit: Payer: Medicare HMO

## 2022-10-14 ENCOUNTER — Other Ambulatory Visit: Payer: Medicare HMO

## 2022-10-21 ENCOUNTER — Encounter: Payer: Medicare HMO | Admitting: Internal Medicine

## 2022-10-21 NOTE — Progress Notes (Signed)
Erroneous encounter - please disregard.

## 2022-10-23 ENCOUNTER — Encounter: Payer: Self-pay | Admitting: Internal Medicine

## 2022-11-11 ENCOUNTER — Observation Stay (HOSPITAL_COMMUNITY): Payer: Medicare HMO

## 2022-11-11 ENCOUNTER — Inpatient Hospital Stay (HOSPITAL_COMMUNITY)
Admission: EM | Admit: 2022-11-11 | Discharge: 2022-11-15 | DRG: 603 | Disposition: A | Discharge status: Home / Self Care | Payer: Medicare HMO | Attending: Internal Medicine | Admitting: Internal Medicine

## 2022-11-11 ENCOUNTER — Other Ambulatory Visit: Payer: Self-pay

## 2022-11-11 ENCOUNTER — Emergency Department (HOSPITAL_COMMUNITY): Payer: Medicare HMO

## 2022-11-11 ENCOUNTER — Encounter (HOSPITAL_COMMUNITY): Payer: Self-pay | Admitting: Emergency Medicine

## 2022-11-11 DIAGNOSIS — Z79899 Other long term (current) drug therapy: Secondary | ICD-10-CM

## 2022-11-11 DIAGNOSIS — Z23 Encounter for immunization: Secondary | ICD-10-CM

## 2022-11-11 DIAGNOSIS — I10 Essential (primary) hypertension: Secondary | ICD-10-CM | POA: Diagnosis not present

## 2022-11-11 DIAGNOSIS — Z8546 Personal history of malignant neoplasm of prostate: Secondary | ICD-10-CM

## 2022-11-11 DIAGNOSIS — E781 Pure hyperglyceridemia: Secondary | ICD-10-CM | POA: Diagnosis present

## 2022-11-11 DIAGNOSIS — R21 Rash and other nonspecific skin eruption: Secondary | ICD-10-CM | POA: Diagnosis present

## 2022-11-11 DIAGNOSIS — Z6830 Body mass index (BMI) 30.0-30.9, adult: Secondary | ICD-10-CM | POA: Diagnosis not present

## 2022-11-11 DIAGNOSIS — L0390 Cellulitis, unspecified: Secondary | ICD-10-CM | POA: Diagnosis present

## 2022-11-11 DIAGNOSIS — F1991 Other psychoactive substance use, unspecified, in remission: Secondary | ICD-10-CM | POA: Diagnosis not present

## 2022-11-11 DIAGNOSIS — F1490 Cocaine use, unspecified, uncomplicated: Secondary | ICD-10-CM | POA: Diagnosis present

## 2022-11-11 DIAGNOSIS — M19072 Primary osteoarthritis, left ankle and foot: Secondary | ICD-10-CM | POA: Diagnosis not present

## 2022-11-11 DIAGNOSIS — R000 Tachycardia, unspecified: Secondary | ICD-10-CM | POA: Diagnosis not present

## 2022-11-11 DIAGNOSIS — E66811 Obesity, class 1: Secondary | ICD-10-CM | POA: Diagnosis present

## 2022-11-11 DIAGNOSIS — I4510 Unspecified right bundle-branch block: Secondary | ICD-10-CM | POA: Diagnosis present

## 2022-11-11 DIAGNOSIS — Z1152 Encounter for screening for COVID-19: Secondary | ICD-10-CM

## 2022-11-11 DIAGNOSIS — Z8249 Family history of ischemic heart disease and other diseases of the circulatory system: Secondary | ICD-10-CM

## 2022-11-11 DIAGNOSIS — L299 Pruritus, unspecified: Secondary | ICD-10-CM | POA: Diagnosis present

## 2022-11-11 DIAGNOSIS — L03116 Cellulitis of left lower limb: Principal | ICD-10-CM | POA: Diagnosis present

## 2022-11-11 DIAGNOSIS — I43 Cardiomyopathy in diseases classified elsewhere: Secondary | ICD-10-CM | POA: Diagnosis present

## 2022-11-11 DIAGNOSIS — I4819 Other persistent atrial fibrillation: Secondary | ICD-10-CM | POA: Diagnosis present

## 2022-11-11 DIAGNOSIS — Z96653 Presence of artificial knee joint, bilateral: Secondary | ICD-10-CM | POA: Diagnosis present

## 2022-11-11 DIAGNOSIS — Z9079 Acquired absence of other genital organ(s): Secondary | ICD-10-CM

## 2022-11-11 DIAGNOSIS — I110 Hypertensive heart disease with heart failure: Secondary | ICD-10-CM | POA: Diagnosis present

## 2022-11-11 DIAGNOSIS — I428 Other cardiomyopathies: Secondary | ICD-10-CM | POA: Diagnosis present

## 2022-11-11 DIAGNOSIS — Z7901 Long term (current) use of anticoagulants: Secondary | ICD-10-CM

## 2022-11-11 DIAGNOSIS — A419 Sepsis, unspecified organism: Secondary | ICD-10-CM | POA: Diagnosis not present

## 2022-11-11 DIAGNOSIS — I5022 Chronic systolic (congestive) heart failure: Secondary | ICD-10-CM | POA: Diagnosis present

## 2022-11-11 DIAGNOSIS — G894 Chronic pain syndrome: Secondary | ICD-10-CM | POA: Diagnosis present

## 2022-11-11 LAB — COMPREHENSIVE METABOLIC PANEL
ALT: 21 U/L (ref 0–44)
AST: 21 U/L (ref 15–41)
Albumin: 3.7 g/dL (ref 3.5–5.0)
Alkaline Phosphatase: 78 U/L (ref 38–126)
Anion gap: 10 (ref 5–15)
BUN: 25 mg/dL — ABNORMAL HIGH (ref 6–20)
CO2: 28 mmol/L (ref 22–32)
Calcium: 8.5 mg/dL — ABNORMAL LOW (ref 8.9–10.3)
Chloride: 95 mmol/L — ABNORMAL LOW (ref 98–111)
Creatinine, Ser: 0.99 mg/dL (ref 0.61–1.24)
GFR, Estimated: >60 mL/min (ref >60)
Glucose, Bld: 135 mg/dL — ABNORMAL HIGH (ref 70–99)
Potassium: 3.6 mmol/L (ref 3.5–5.1)
Sodium: 133 mmol/L — ABNORMAL LOW (ref 135–145)
Total Bilirubin: 1.7 mg/dL — ABNORMAL HIGH (ref 0.3–1.2)
Total Protein: 7.1 g/dL (ref 6.5–8.1)

## 2022-11-11 LAB — CBC WITH DIFFERENTIAL/PLATELET
Abs Immature Granulocytes: 0.03 10*3/uL (ref 0.00–0.07)
Basophils Absolute: 0.1 10*3/uL (ref 0.0–0.1)
Basophils Relative: 1 %
Eosinophils Absolute: 0.1 10*3/uL (ref 0.0–0.5)
Eosinophils Relative: 1 %
HCT: 38.3 % — ABNORMAL LOW (ref 39.0–52.0)
Hemoglobin: 11.7 g/dL — ABNORMAL LOW (ref 13.0–17.0)
Immature Granulocytes: 0 %
Lymphocytes Relative: 7 %
Lymphs Abs: 0.7 10*3/uL (ref 0.7–4.0)
MCH: 25.6 pg — ABNORMAL LOW (ref 26.0–34.0)
MCHC: 30.5 g/dL (ref 30.0–36.0)
MCV: 83.8 fL (ref 80.0–100.0)
Monocytes Absolute: 0.5 10*3/uL (ref 0.1–1.0)
Monocytes Relative: 5 %
Neutro Abs: 9 10*3/uL — ABNORMAL HIGH (ref 1.7–7.7)
Neutrophils Relative %: 86 %
Platelets: 241 10*3/uL (ref 150–400)
RBC: 4.57 MIL/uL (ref 4.22–5.81)
RDW: 16.9 % — ABNORMAL HIGH (ref 11.5–15.5)
WBC: 10.5 10*3/uL (ref 4.0–10.5)
nRBC: 0 % (ref 0.0–0.2)

## 2022-11-11 LAB — RAPID URINE DRUG SCREEN, HOSP PERFORMED
Amphetamines: NOT DETECTED
Barbiturates: NOT DETECTED
Benzodiazepines: NOT DETECTED
Cocaine: POSITIVE — AB
Opiates: NOT DETECTED
Tetrahydrocannabinol: NOT DETECTED

## 2022-11-11 LAB — URINALYSIS, W/ REFLEX TO CULTURE (INFECTION SUSPECTED)
Bacteria, UA: NONE SEEN
Bilirubin Urine: NEGATIVE
Glucose, UA: NEGATIVE mg/dL
Hgb urine dipstick: NEGATIVE
Ketones, ur: NEGATIVE mg/dL
Leukocytes,Ua: NEGATIVE
Nitrite: NEGATIVE
Protein, ur: NEGATIVE mg/dL
Specific Gravity, Urine: 1.009 (ref 1.005–1.030)
pH: 5 (ref 5.0–8.0)

## 2022-11-11 LAB — RESP PANEL BY RT-PCR (RSV, FLU A&B, COVID)  RVPGX2
Influenza A by PCR: NEGATIVE
Influenza B by PCR: NEGATIVE
Resp Syncytial Virus by PCR: NEGATIVE
SARS Coronavirus 2 by RT PCR: NEGATIVE

## 2022-11-11 LAB — PROTIME-INR
INR: 1.2 (ref 0.8–1.2)
Prothrombin Time: 15.8 s — ABNORMAL HIGH (ref 11.4–15.2)

## 2022-11-11 LAB — LACTIC ACID, PLASMA
Lactic Acid, Venous: 1.4 mmol/L (ref 0.5–1.9)
Lactic Acid, Venous: 0.7 mmol/L (ref 0.5–1.9)

## 2022-11-11 LAB — APTT: aPTT: 31 s (ref 24–36)

## 2022-11-11 MED ORDER — ONDANSETRON HCL 4 MG PO TABS: 4.0000 mg | ORAL_TABLET | Freq: Four times a day (QID) | ORAL | Status: DC | PRN

## 2022-11-11 MED ORDER — SODIUM CHLORIDE 0.9 % IV BOLUS (SEPSIS)
1000.0000 mL | Freq: Once | INTRAVENOUS | Status: AC
  Administered 2022-11-11: 1000 mL via INTRAVENOUS

## 2022-11-11 MED ORDER — SPIRONOLACTONE 12.5 MG HALF TABLET
12.5000 mg | ORAL_TABLET | Freq: Every day | ORAL | Status: DC
  Administered 2022-11-12 – 2022-11-15 (×4): 12.5 mg via ORAL
  Filled 2022-11-11 (×4): qty 1

## 2022-11-11 MED ORDER — METHYLPREDNISOLONE SODIUM SUCC 125 MG IJ SOLR
INTRAMUSCULAR | Status: AC
  Administered 2022-11-11: 125 mg via INTRAVENOUS
  Filled 2022-11-11: qty 2

## 2022-11-11 MED ORDER — FAMOTIDINE IN NACL 20-0.9 MG/50ML-% IV SOLN: 20.0000 mg | Freq: Once | INTRAVENOUS | Status: AC

## 2022-11-11 MED ORDER — VANCOMYCIN HCL 2000 MG/400ML IV SOLN
2000.0000 mg | Freq: Once | INTRAVENOUS | Status: DC
  Filled 2022-11-11 (×2): qty 400

## 2022-11-11 MED ORDER — FAMOTIDINE IN NACL 20-0.9 MG/50ML-% IV SOLN
INTRAVENOUS | Status: AC
  Administered 2022-11-11: 20 mg via INTRAVENOUS
  Filled 2022-11-11: qty 50

## 2022-11-11 MED ORDER — ADULT MULTIVITAMIN W/MINERALS CH
1.0000 | ORAL_TABLET | Freq: Every day | ORAL | Status: DC
  Administered 2022-11-12 – 2022-11-15 (×4): 1 via ORAL
  Filled 2022-11-11 (×4): qty 1

## 2022-11-11 MED ORDER — THIAMINE MONONITRATE 100 MG PO TABS
100.0000 mg | ORAL_TABLET | Freq: Every day | ORAL | Status: DC
  Administered 2022-11-12 – 2022-11-15 (×4): 100 mg via ORAL
  Filled 2022-11-11 (×4): qty 1

## 2022-11-11 MED ORDER — LOSARTAN POTASSIUM 25 MG PO TABS
12.5000 mg | ORAL_TABLET | Freq: Every day | ORAL | Status: DC
  Administered 2022-11-12 – 2022-11-14 (×3): 12.5 mg via ORAL
  Filled 2022-11-11 (×3): qty 1

## 2022-11-11 MED ORDER — VANCOMYCIN HCL 1750 MG/350ML IV SOLN
1750.0000 mg | Freq: Two times a day (BID) | INTRAVENOUS | Status: DC
  Administered 2022-11-12 – 2022-11-13 (×3): 1750 mg via INTRAVENOUS
  Filled 2022-11-11 (×6): qty 350

## 2022-11-11 MED ORDER — DIPHENHYDRAMINE HCL 50 MG/ML IJ SOLN: 25.0000 mg | Freq: Once | INTRAMUSCULAR | Status: AC

## 2022-11-11 MED ORDER — ACETAMINOPHEN 650 MG RE SUPP: 650.0000 mg | Freq: Four times a day (QID) | RECTAL | Status: DC | PRN

## 2022-11-11 MED ORDER — INFLUENZA VIRUS VACC SPLIT PF (FLUZONE) 0.5 ML IM SUSY
0.5000 mL | PREFILLED_SYRINGE | INTRAMUSCULAR | Status: AC
  Administered 2022-11-12: 0.5 mL via INTRAMUSCULAR
  Filled 2022-11-11: qty 0.5

## 2022-11-11 MED ORDER — SODIUM CHLORIDE 0.9 % IV SOLN
1.0000 g | Freq: Once | INTRAVENOUS | Status: AC
  Administered 2022-11-11: 1 g via INTRAVENOUS
  Filled 2022-11-11: qty 10

## 2022-11-11 MED ORDER — OXYCODONE HCL 5 MG PO TABS
10.0000 mg | ORAL_TABLET | ORAL | Status: DC | PRN
  Administered 2022-11-11 – 2022-11-15 (×13): 10 mg via ORAL
  Filled 2022-11-11 (×13): qty 2

## 2022-11-11 MED ORDER — METHYLPREDNISOLONE SODIUM SUCC 125 MG IJ SOLR: 125.0000 mg | Freq: Once | INTRAMUSCULAR | Status: AC

## 2022-11-11 MED ORDER — SODIUM CHLORIDE 0.9 % IV SOLN: 1.0000 g | INTRAVENOUS | Status: DC

## 2022-11-11 MED ORDER — FOLIC ACID 1 MG PO TABS
1.0000 mg | ORAL_TABLET | Freq: Every day | ORAL | Status: DC
  Administered 2022-11-12 – 2022-11-15 (×4): 1 mg via ORAL
  Filled 2022-11-11 (×4): qty 1

## 2022-11-11 MED ORDER — POTASSIUM CHLORIDE CRYS ER 20 MEQ PO TBCR
40.0000 meq | EXTENDED_RELEASE_TABLET | Freq: Once | ORAL | Status: AC
  Administered 2022-11-11: 40 meq via ORAL
  Filled 2022-11-11: qty 2

## 2022-11-11 MED ORDER — ACETAMINOPHEN 325 MG PO TABS
650.0000 mg | ORAL_TABLET | Freq: Four times a day (QID) | ORAL | Status: DC | PRN
  Administered 2022-11-12: 650 mg via ORAL
  Filled 2022-11-11: qty 2

## 2022-11-11 MED ORDER — DIPHENHYDRAMINE HCL 50 MG/ML IJ SOLN
INTRAMUSCULAR | Status: AC
  Administered 2022-11-11: 25 mg via INTRAVENOUS
  Filled 2022-11-11: qty 1

## 2022-11-11 MED ORDER — SODIUM CHLORIDE 0.9% FLUSH
3.0000 mL | Freq: Two times a day (BID) | INTRAVENOUS | Status: DC
  Administered 2022-11-12 – 2022-11-15 (×5): 3 mL via INTRAVENOUS

## 2022-11-11 MED ORDER — APIXABAN 5 MG PO TABS
5.0000 mg | ORAL_TABLET | Freq: Two times a day (BID) | ORAL | Status: DC
  Administered 2022-11-11 – 2022-11-15 (×8): 5 mg via ORAL
  Filled 2022-11-11 (×8): qty 1

## 2022-11-11 MED ORDER — SODIUM CHLORIDE 0.9 % IV SOLN
2.0000 g | INTRAVENOUS | Status: DC
  Administered 2022-11-11: 2 g via INTRAVENOUS
  Filled 2022-11-11: qty 20

## 2022-11-11 MED ORDER — ONDANSETRON HCL 4 MG/2ML IJ SOLN: 4.0000 mg | Freq: Four times a day (QID) | INTRAMUSCULAR | Status: DC | PRN

## 2022-11-11 MED ORDER — SODIUM CHLORIDE 0.9 % IV BOLUS (SEPSIS)
500.0000 mL | Freq: Once | INTRAVENOUS | Status: AC
  Administered 2022-11-11: 500 mL via INTRAVENOUS

## 2022-11-11 NOTE — Consult Note (Signed)
Virtual Visit via Telephone/Video Note   I connected with Andrew Haley   On 11/11/2022 at 4:10 PM  by Video and verified that I am speaking with the correct person using two identifiers.   I discussed the limitations, risks, security and privacy concerns of performing an evaluation and management service by telephone and the availability of in person appointments. I also discussed with the patient that there may be a patient responsible charge related to this service. The patient expressed understanding and agreed to proceed.   Location:   Patient: Home          [facility]    Date of Admission:  11/11/2022   Total days of inpatient antibiotics ***        Reason for Consult: ***    Active Problems:   Cellulitis   Assessment: ***   Recommendations:  -"I just told you yes(trying ctx again), I didn't have a reaciton" Microbiology:   Antibiotics:   Cultures: Blood  Urine  Other   HPI: Andrew Haley is a 57 y.o. male ***   Review of Systems: ROS  Past Medical History:  Diagnosis Date   Arthritis    Class 1 obesity 07/18/2020   Complication of anesthesia    slow to wake up   Dysrhythmia    Essential hypertension 07/18/2020   Headache    Hypertension    Hypertriglyceridemia    Numbness of fingers of both hands    Prostate cancer (HCC) 2017   Sleep apnea    CPAP    Social History   Tobacco Use   Smoking status: Never   Smokeless tobacco: Never  Vaping Use   Vaping status: Never Used  Substance Use Topics   Alcohol use: Yes    Comment: 5-6 beers daily   Drug use: Not Currently    Types: Cocaine    Comment: 1 week ago    Family History  Problem Relation Age of Onset   Heart disease Mother    Throat cancer Father    Colon cancer Neg Hx    Colon polyps Neg Hx    Scheduled Meds: Continuous Infusions:  cefTRIAXone (ROCEPHIN)  IV Stopped (11/11/22 1348)   PRN Meds:. No Known Allergies  OBJECTIVE: Blood pressure  (!) 118/101, pulse 85, temperature 98.3 F (36.8 C), temperature source Oral, resp. rate 16, height 6\' 3"  (1.905 m), weight 111.1 kg, SpO2 97%.  Physical Exam  Lab Results Lab Results  Component Value Date   WBC 10.5 11/11/2022   HGB 11.7 (L) 11/11/2022   HCT 38.3 (L) 11/11/2022   MCV 83.8 11/11/2022   PLT 241 11/11/2022    Lab Results  Component Value Date   CREATININE 0.99 11/11/2022   BUN 25 (H) 11/11/2022   NA 133 (L) 11/11/2022   K 3.6 11/11/2022   CL 95 (L) 11/11/2022   CO2 28 11/11/2022    Lab Results  Component Value Date   ALT 21 11/11/2022   AST 21 11/11/2022   ALKPHOS 78 11/11/2022   BILITOT 1.7 (H) 11/11/2022       Danelle Earthly, MD [facility] 11/11/2022, 4:11 PM

## 2022-11-11 NOTE — ED Triage Notes (Signed)
Pt presents with left lower leg cellulitis, sepsis protocols initiated.

## 2022-11-11 NOTE — ED Notes (Signed)
Admitting at [facility]

## 2022-11-11 NOTE — Progress Notes (Signed)
Rocephin was started and then stopped after about 30 minutes. Pt. Became extremely anxious, restless, and sweating profusely. Stopped and flushed IV. B/P and respirations did elevate to 24 breaths per minute and 140/98. Dr. Jarvis Newcomer made aware and will continue to monitor patient and VS.
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
## 2022-11-11 NOTE — ED Notes (Signed)
EDP at [facility], family at [facility], Lab at [facility], xray at [facility]
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
## 2022-11-11 NOTE — ED Notes (Signed)
EDP into room, at [facility]. States, "feel better", itching lessened. Pt remains alert, NAD, calm and anxious, interactive, cooperative, sitting on side of bed, c/o sob, LS CTA/ decreased. Family at [facility]. EKG repeated.

## 2022-11-11 NOTE — ED Notes (Signed)
"  Feeling better after benadryl given and rocephin stopped

## 2022-11-11 NOTE — Sepsis Progress Note (Signed)
Elink monitoring for the code sepsis protocol.  

## 2022-11-11 NOTE — ED Provider Notes (Signed)
[place] EMERGENCY DEPARTMENT AT [facility] Provider Note   CSN: [id number] Arrival date & time: 11/11/22  1156     History {Add pertinent medical, surgical, social history, OB history to HPI:1} Chief Complaint  Patient presents with   Cellulitis    Left leg    Andrew Haley is a 57 y.o. male.  Patient has history of hypertension and sleep apnea and cellulitis to his left lower leg.  He complains of fever and swelling to his left lower leg   Rash      Home Medications Prior to Admission medications   Medication Sig Start Date End Date Taking? Authorizing Provider  apixaban (ELIQUIS) 5 MG TABS tablet Take 1 tablet (5 mg total) by mouth 2 (two) times daily. 08/02/22   Johnson, Clanford L, MD  folic acid (FOLVITE) 1 MG tablet Take 1 tablet (1 mg total) by mouth daily. 08/03/22   Johnson, Clanford L, MD  furosemide (LASIX) 40 MG tablet Take 1 tablet (40 mg total) by mouth daily. Patient taking differently: Take 40 mg by mouth daily as needed for fluid or edema. 08/02/22   Johnson, Clanford L, MD  losartan (COZAAR) 25 MG tablet Take 0.5 tablets (12.5 mg total) by mouth daily. 08/02/22   Johnson, Clanford L, MD  metoprolol succinate (TOPROL-XL) 50 MG 24 hr tablet Take 3 tablets (150 mg total) by mouth daily. Take with or immediately following a meal. 08/03/22   Johnson, Clanford L, MD  Multiple Vitamin (MULTIVITAMIN WITH MINERALS) TABS tablet Take 1 tablet by mouth daily. 08/03/22   Cleora Fleet, MD  naloxone [facility]) nasal spray 4 mg/0.1 mL Spray into nostrils if patient shows signs of opioid overdose, then call 911 03/07/21   Cassandria Anger, PA-C  Oxycodone HCl 10 MG TABS Take 10 mg by mouth every 4 (four) hours as needed (pain).    [provider]  potassium chloride SA (KLOR-CON M) 20 MEQ tablet Take 1 tablet (20 mEq total) by mouth daily. 08/02/22   Johnson, Clanford L, MD  spironolactone (ALDACTONE) 25 MG tablet Take 0.5 tablets (12.5 mg total) by mouth  daily. 08/03/22   Johnson, Clanford L, MD  thiamine (VITAMIN B-1) 100 MG tablet Take 1 tablet (100 mg total) by mouth daily. 08/03/22   Cleora Fleet, MD      Allergies    Patient has no known allergies.    Review of Systems   Review of Systems  Skin:  Positive for rash.    Physical Exam Updated Vital Signs BP 130/87   Pulse 97   Temp 98.3 F (36.8 C) (Oral)   Resp 15   Ht 6\' 3"  (1.905 m)   Wt 111.1 kg   SpO2 90%   BMI 30.62 kg/m  Physical Exam  ED Results / Procedures / Treatments   Labs (all labs ordered are listed, but only abnormal results are displayed) Labs Reviewed  COMPREHENSIVE METABOLIC PANEL - Abnormal; Notable for the following components:      Result Value   Sodium 133 (*)    Chloride 95 (*)    Glucose, Bld 135 (*)    BUN 25 (*)    Calcium 8.5 (*)    Total Bilirubin 1.7 (*)    All other components within normal limits  CBC WITH DIFFERENTIAL/PLATELET - Abnormal; Notable for the following components:   Hemoglobin 11.7 (*)    HCT 38.3 (*)    MCH 25.6 (*)    RDW 16.9 (*)  Neutro Abs 9.0 (*)    All other components within normal limits  PROTIME-INR - Abnormal; Notable for the following components:   Prothrombin Time 15.8 (*)    All other components within normal limits  CULTURE, BLOOD (ROUTINE X 2)  CULTURE, BLOOD (ROUTINE X 2)  RESP PANEL BY RT-PCR (RSV, FLU A&B, COVID)  RVPGX2  LACTIC ACID, PLASMA  APTT  LACTIC ACID, PLASMA  URINALYSIS, W/ REFLEX TO CULTURE (INFECTION SUSPECTED)    EKG None  Radiology DG Chest Port 1 View  Result Date: 11/11/2022 CLINICAL DATA:  Cellulitis, sepsis. EXAM: PORTABLE CHEST 1 VIEW COMPARISON:  08/01/2022 FINDINGS: Cardiac silhouette appears prominent. No pneumonia or pulmonary edema. No pneumothorax or pleural effusion. IMPRESSION: Enlarged cardiac silhouette.  Lungs are clear. Electronically Signed   By: Layla Maw M.D.   On: 11/11/2022 14:21    Procedures Procedures  {Document cardiac monitor,  telemetry assessment procedure when appropriate:1}  Medications Ordered in ED Medications  cefTRIAXone (ROCEPHIN) 2 g in sodium chloride 0.9 % 100 mL IVPB (0 g Intravenous Stopped 11/11/22 1348)  sodium chloride 0.9 % bolus 1,000 mL (0 mLs Intravenous Stopped 11/11/22 1346)    And  sodium chloride 0.9 % bolus 1,000 mL (0 mLs Intravenous Stopped 11/11/22 1352)    And  sodium chloride 0.9 % bolus 1,000 mL (0 mLs Intravenous Stopped 11/11/22 1449)    And  sodium chloride 0.9 % bolus 500 mL (0 mLs Intravenous Stopped 11/11/22 1451)  diphenhydrAMINE (BENADRYL) injection 25 mg (25 mg Intravenous Given 11/11/22 1348)  methylPREDNISolone sodium succinate (SOLU-MEDROL) 125 mg/2 mL injection 125 mg (125 mg Intravenous Given 11/11/22 1420)  famotidine (PEPCID) IVPB 20 mg premix (20 mg Intravenous New Bag/Given 11/11/22 1421)    ED Course/ Medical Decision Making/ A&P  CRITICAL CARE Performed by: Bethann Berkshire Total critical care time: 40 minutes Critical care time was exclusive of separately billable procedures and treating other patients. Critical care was necessary to treat or prevent imminent or life-threatening deterioration. Critical care was time spent personally by me on the following activities: development of treatment plan with patient and/or surrogate as well as nursing, discussions with consultants, evaluation of patient's response to treatment, examination of patient, obtaining history from patient or surrogate, ordering and performing treatments and interventions, ordering and review of laboratory studies, ordering and review of radiographic studies, pulse oximetry and re-evaluation of patient's condition.  {   Click here for ABCD2, HEART and other calculatorsREFRESH Note before signing :1}                              Medical Decision Making Amount and/or Complexity of Data Reviewed Labs: ordered. Radiology: ordered. ECG/medicine tests: ordered.  Risk Prescription drug  management. Decision regarding hospitalization.   Patient with cellulitis to left lower leg and possible sepsis.  He will be admitted to medicine  {Document critical care time when appropriate:1} {Document review of labs and clinical decision tools ie heart score, Chads2Vasc2 etc:1}  {Document your independent review of radiology images, and any outside records:1} {Document your discussion with family members, caretakers, and with consultants:1} {Document social determinants of health affecting pt's care:1} {Document your decision making why or why not admission, treatments were needed:1} Final Clinical Impression(s) / ED Diagnoses Final diagnoses:  Cellulitis of left lower extremity    Rx / DC Orders ED Discharge Orders     None

## 2022-11-12 DIAGNOSIS — I428 Other cardiomyopathies: Secondary | ICD-10-CM | POA: Diagnosis present

## 2022-11-12 DIAGNOSIS — F1490 Cocaine use, unspecified, uncomplicated: Secondary | ICD-10-CM | POA: Diagnosis present

## 2022-11-12 DIAGNOSIS — I4510 Unspecified right bundle-branch block: Secondary | ICD-10-CM | POA: Diagnosis present

## 2022-11-12 DIAGNOSIS — R21 Rash and other nonspecific skin eruption: Secondary | ICD-10-CM | POA: Diagnosis present

## 2022-11-12 DIAGNOSIS — Z23 Encounter for immunization: Secondary | ICD-10-CM | POA: Diagnosis not present

## 2022-11-12 DIAGNOSIS — I4819 Other persistent atrial fibrillation: Secondary | ICD-10-CM | POA: Diagnosis present

## 2022-11-12 DIAGNOSIS — Z79899 Other long term (current) drug therapy: Secondary | ICD-10-CM | POA: Diagnosis not present

## 2022-11-12 DIAGNOSIS — I5022 Chronic systolic (congestive) heart failure: Secondary | ICD-10-CM | POA: Diagnosis present

## 2022-11-12 DIAGNOSIS — E66811 Obesity, class 1: Secondary | ICD-10-CM | POA: Diagnosis present

## 2022-11-12 DIAGNOSIS — Z7901 Long term (current) use of anticoagulants: Secondary | ICD-10-CM | POA: Diagnosis not present

## 2022-11-12 DIAGNOSIS — L0390 Cellulitis, unspecified: Secondary | ICD-10-CM | POA: Diagnosis present

## 2022-11-12 DIAGNOSIS — Z8546 Personal history of malignant neoplasm of prostate: Secondary | ICD-10-CM | POA: Diagnosis not present

## 2022-11-12 DIAGNOSIS — L299 Pruritus, unspecified: Secondary | ICD-10-CM | POA: Diagnosis present

## 2022-11-12 DIAGNOSIS — I110 Hypertensive heart disease with heart failure: Secondary | ICD-10-CM | POA: Diagnosis present

## 2022-11-12 DIAGNOSIS — Z9079 Acquired absence of other genital organ(s): Secondary | ICD-10-CM | POA: Diagnosis not present

## 2022-11-12 DIAGNOSIS — Z6830 Body mass index (BMI) 30.0-30.9, adult: Secondary | ICD-10-CM | POA: Diagnosis not present

## 2022-11-12 DIAGNOSIS — Z1152 Encounter for screening for COVID-19: Secondary | ICD-10-CM | POA: Diagnosis not present

## 2022-11-12 DIAGNOSIS — G894 Chronic pain syndrome: Secondary | ICD-10-CM | POA: Diagnosis present

## 2022-11-12 DIAGNOSIS — Z96653 Presence of artificial knee joint, bilateral: Secondary | ICD-10-CM | POA: Diagnosis present

## 2022-11-12 DIAGNOSIS — Z8249 Family history of ischemic heart disease and other diseases of the circulatory system: Secondary | ICD-10-CM | POA: Diagnosis not present

## 2022-11-12 DIAGNOSIS — E781 Pure hyperglyceridemia: Secondary | ICD-10-CM | POA: Diagnosis present

## 2022-11-12 DIAGNOSIS — L03116 Cellulitis of left lower limb: Secondary | ICD-10-CM | POA: Diagnosis present

## 2022-11-12 LAB — BASIC METABOLIC PANEL
Anion gap: 10 (ref 5–15)
BUN: 21 mg/dL — ABNORMAL HIGH (ref 6–20)
CO2: 27 mmol/L (ref 22–32)
Calcium: 8.4 mg/dL — ABNORMAL LOW (ref 8.9–10.3)
Chloride: 98 mmol/L (ref 98–111)
Creatinine, Ser: 0.91 mg/dL (ref 0.61–1.24)
GFR, Estimated: >60 mL/min (ref >60)
Glucose, Bld: 161 mg/dL — ABNORMAL HIGH (ref 70–99)
Potassium: 3.8 mmol/L (ref 3.5–5.1)
Sodium: 135 mmol/L (ref 135–145)

## 2022-11-12 LAB — CBC
HCT: 36.3 % — ABNORMAL LOW (ref 39.0–52.0)
Hemoglobin: 11.1 g/dL — ABNORMAL LOW (ref 13.0–17.0)
MCH: 25.5 pg — ABNORMAL LOW (ref 26.0–34.0)
MCHC: 30.6 g/dL (ref 30.0–36.0)
MCV: 83.3 fL (ref 80.0–100.0)
Platelets: 191 10*3/uL (ref 150–400)
RBC: 4.36 MIL/uL (ref 4.22–5.81)
RDW: 16.4 % — ABNORMAL HIGH (ref 11.5–15.5)
WBC: 12.1 10*3/uL — ABNORMAL HIGH (ref 4.0–10.5)
nRBC: 0 % (ref 0.0–0.2)

## 2022-11-12 LAB — MAGNESIUM: Magnesium: 1.7 mg/dL (ref 1.7–2.4)

## 2022-11-12 MED ORDER — SODIUM CHLORIDE 0.9 % IV SOLN: INTRAVENOUS | Status: DC | PRN

## 2022-11-12 MED ORDER — DILTIAZEM HCL 30 MG PO TABS
30.0000 mg | ORAL_TABLET | Freq: Three times a day (TID) | ORAL | Status: DC
  Administered 2022-11-12 – 2022-11-13 (×4): 30 mg via ORAL
  Filled 2022-11-12 (×4): qty 1

## 2022-11-12 MED ORDER — CEFAZOLIN SODIUM-DEXTROSE 2-4 GM/100ML-% IV SOLN
2.0000 g | Freq: Three times a day (TID) | INTRAVENOUS | Status: DC
  Administered 2022-11-12 – 2022-11-13 (×3): 2 g via INTRAVENOUS
  Filled 2022-11-12 (×3): qty 100
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
## 2022-11-12 NOTE — Progress Notes (Signed)
Vanc started at 0600 with no adverse reaction.  Received prn oxycodone twice last night for leg pain.
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
## 2022-11-13 DIAGNOSIS — L03116 Cellulitis of left lower limb: Secondary | ICD-10-CM | POA: Diagnosis not present

## 2022-11-13 LAB — CBC
HCT: 35.1 % — ABNORMAL LOW (ref 39.0–52.0)
Hemoglobin: 10.9 g/dL — ABNORMAL LOW (ref 13.0–17.0)
MCH: 25.9 pg — ABNORMAL LOW (ref 26.0–34.0)
MCHC: 31.1 g/dL (ref 30.0–36.0)
MCV: 83.4 fL (ref 80.0–100.0)
Platelets: 230 10*3/uL (ref 150–400)
RBC: 4.21 MIL/uL — ABNORMAL LOW (ref 4.22–5.81)
RDW: 16.7 % — ABNORMAL HIGH (ref 11.5–15.5)
WBC: 12.5 10*3/uL — ABNORMAL HIGH (ref 4.0–10.5)
nRBC: 0 % (ref 0.0–0.2)

## 2022-11-13 LAB — BASIC METABOLIC PANEL
Anion gap: 9 (ref 5–15)
BUN: 18 mg/dL (ref 6–20)
CO2: 27 mmol/L (ref 22–32)
Calcium: 8.5 mg/dL — ABNORMAL LOW (ref 8.9–10.3)
Chloride: 99 mmol/L (ref 98–111)
Creatinine, Ser: 0.8 mg/dL (ref 0.61–1.24)
GFR, Estimated: >60 mL/min (ref >60)
Glucose, Bld: 126 mg/dL — ABNORMAL HIGH (ref 70–99)
Potassium: 3.9 mmol/L (ref 3.5–5.1)
Sodium: 135 mmol/L (ref 135–145)

## 2022-11-13 LAB — MAGNESIUM: Magnesium: 1.9 mg/dL (ref 1.7–2.4)

## 2022-11-13 MED ORDER — DILTIAZEM HCL 60 MG PO TABS
60.0000 mg | ORAL_TABLET | Freq: Three times a day (TID) | ORAL | Status: DC
  Administered 2022-11-13 – 2022-11-14 (×2): 60 mg via ORAL
  Filled 2022-11-13 (×2): qty 1

## 2022-11-13 MED ORDER — SODIUM CHLORIDE 0.9 % IV SOLN
1.0000 g | INTRAVENOUS | Status: DC
  Administered 2022-11-13 – 2022-11-14 (×2): 1 g via INTRAVENOUS
  Filled 2022-11-13 (×3): qty 1000

## 2022-11-13 MED ORDER — DILTIAZEM HCL 25 MG/5ML IV SOLN
10.0000 mg | Freq: Once | INTRAVENOUS | Status: AC
  Administered 2022-11-13: 10 mg via INTRAVENOUS
  Filled 2022-11-13: qty 5

## 2022-11-13 MED ORDER — LINEZOLID 600 MG PO TABS
600.0000 mg | ORAL_TABLET | Freq: Two times a day (BID) | ORAL | Status: DC
  Administered 2022-11-13 – 2022-11-15 (×5): 600 mg via ORAL
  Filled 2022-11-13 (×9): qty 1

## 2022-11-13 MED ORDER — MELATONIN 3 MG PO TABS
6.0000 mg | ORAL_TABLET | Freq: Once | ORAL | Status: AC
  Administered 2022-11-13: 6 mg via ORAL
  Filled 2022-11-13: qty 2

## 2022-11-13 MED ORDER — DIPHENHYDRAMINE HCL 25 MG PO CAPS
25.0000 mg | ORAL_CAPSULE | Freq: Four times a day (QID) | ORAL | Status: DC | PRN
  Administered 2022-11-13 (×2): 25 mg via ORAL
  Filled 2022-11-13 (×2): qty 1

## 2022-11-13 MED ORDER — VANCOMYCIN HCL 2000 MG/400ML IV SOLN
2000.0000 mg | Freq: Two times a day (BID) | INTRAVENOUS | Status: DC
  Administered 2022-11-13 – 2022-11-14 (×3): 2000 mg via INTRAVENOUS
  Filled 2022-11-13 (×6): qty 400

## 2022-11-13 NOTE — Plan of Care (Signed)
  Problem: Education: Goal: Knowledge of General Education information will improve Description: Including pain rating scale, medication(s)/side effects and non-pharmacologic comfort measures Outcome: Progressing   Problem: Clinical Measurements: Goal: Will remain free from infection Outcome: Progressing   Problem: Activity: Goal: Risk for activity intolerance will decrease Outcome: Progressing   Problem: Nutrition: Goal: Adequate nutrition will be maintained Outcome: Progressing   Problem: Elimination: Goal: Will not experience complications related to urinary retention Outcome: Progressing   Problem: Pain Managment: Goal: General experience of comfort will improve Outcome: Progressing

## 2022-11-13 NOTE — Progress Notes (Signed)
Pt called out complaining of itching on arms and thighs. When entering room pt had red hives on arms and legs. Pt having no trouble with breathing, just states that he is itching all over. I discontinued Vancomycin and MD notified. Awaiting new orders.

## 2022-11-13 NOTE — TOC Initial Note (Signed)
Transition of Care [facility]) - Initial/Assessment Note    Patient Details  Name: Andrew Haley MRN: [id number] Date of Birth: 1966/01/03  Transition of Care [facility] CM/SW Contact:    Leitha Bleak, RN Phone Number: 11/13/2022, 1:34 PM  Clinical Narrative:     Patient admitted with cellulitis, high risk for readmission. Lives home alone, independent of ADL's  [facility] Liaison also following. Discharge planning for 2 days. TOC following.    Expected Discharge Plan: Home/Self Care Barriers to Discharge: Continued Medical Work up   Patient Goals and CMS Choice Patient states their goals for this hospitalization and ongoing recovery are:: to go home. CMS Medicare.gov Compare Post Acute Care list provided to:: Patient    Expected Discharge Plan and Services      Living arrangements for the past 2 months: Single Family Home                     Prior Living Arrangements/Services Living arrangements for the past 2 months: Single Family Home Lives with:: Self Patient language and need for interpreter reviewed:: Yes        Need for Family Participation in Patient Care: Yes (Comment) Care giver support system in place?: Yes (comment)   Criminal Activity/Legal Involvement Pertinent to Current Situation/Hospitalization: No - Comment as needed  Activities of Daily Living   ADL Screening (condition at time of admission) Independently performs ADLs?: Yes (appropriate for developmental age) Is the patient deaf or have difficulty hearing?: No Does the patient have difficulty seeing, even when wearing glasses/contacts?: No Does the patient have difficulty concentrating, remembering, or making decisions?: No    Emotional Assessment     Affect (typically observed): Accepting Orientation: : Oriented to Self, Oriented to Place, Oriented to  Time, Oriented to Situation Alcohol / Substance Use: Not Applicable Psych Involvement: No (comment)  Admission diagnosis:  Cellulitis  [L03.90] Cellulitis of left lower extremity [L03.116] Patient Active Problem List   Diagnosis Date Noted   Cellulitis 11/11/2022   ERRONEOUS ENCOUNTER--DISREGARD 10/21/2022   Alcohol abuse 09/09/2022   Tachycardia induced cardiomyopathy 07/31/2022   History of recreational drug use 07/30/2022   Persistent atrial fibrillation (HCC) 07/29/2022   Hypokalemia 07/29/2022   Acute HFrEF 07/29/2022   Aneurysm of ascending aorta (HCC) 07/29/2022   Aspiration pneumonia of both lower lobes (HCC) 01/08/2022   History of Alcohol withdrawal seizure with complication 01/08/2022   Alcohol withdrawal syndrome, with delirium (HCC) 01/07/2022   Acute respiratory failure with hypoxia (HCC) 01/02/2022   Status epilepticus (HCC) 01/01/2022   S/P revision of total knee, right 03/04/2021   Medication monitoring encounter 09/10/2020   PICC (peripherally inserted central catheter) removal 09/10/2020   Septic arthritis (HCC) 07/23/2020   Chronic pain syndrome    Cellulitis of left lower extremity 07/18/2020   Normocytic anemia 07/18/2020   Hypocalcemia 07/18/2020   Essential hypertension 07/18/2020   Class 1 obesity 07/18/2020   Encounter for screening colonoscopy 07/30/2017   Prostate cancer (HCC) 11/29/2015   Postoperative anemia due to acute blood loss 05/25/2014   History of total knee arthroplasty 05/23/2014   H/O total knee replacement 02/22/2014   PCP:  Elfredia Nevins, MD Pharmacy:   [facility] [place] [place] - [address] [place] [address] Phone: [phone] Fax: [phone]   Social Determinants of Health (SDOH) Social History: SDOH Screenings   Food Insecurity: No Food Insecurity (11/11/2022)  Housing: Low Risk  (11/11/2022)  Transportation Needs: No Transportation Needs (  11/11/2022)  Utilities: Not At Risk (11/11/2022)  Tobacco Use: Low Risk  (11/11/2022)   SDOH Interventions:    Readmission Risk Interventions    11/13/2022    1:23 PM 07/30/2020    12:17 PM  Readmission Risk Prevention Plan  Post Dischage Appt  Complete  Medication Screening  Complete  Transportation Screening Complete Complete  PCP or Specialist Appt within 3-5 Days Not Complete   HRI or Home Care Consult Complete   Social Work Consult for Recovery Care Planning/Counseling Complete   Palliative Care Screening Not Applicable   Medication Review Oceanographer) Complete
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
## 2022-11-13 NOTE — Progress Notes (Signed)
Pt heart rate increased to 150's to 160's on monitor,  pt became restless and began to break out into hives. MD notified. IV Cardizem ordered and PRN benadryl given.

## 2022-11-13 NOTE — Progress Notes (Signed)
Asked for pain medication and sleep aid last night .  With melatonin and oxycodone did not sleep very well. Continues to have edema in lower extremities. No adverse reactions to IV abx.

## 2022-11-14 DIAGNOSIS — L03116 Cellulitis of left lower limb: Secondary | ICD-10-CM | POA: Diagnosis not present

## 2022-11-14 LAB — CBC
HCT: 34.9 % — ABNORMAL LOW (ref 39.0–52.0)
Hemoglobin: 10.6 g/dL — ABNORMAL LOW (ref 13.0–17.0)
MCH: 25.5 pg — ABNORMAL LOW (ref 26.0–34.0)
MCHC: 30.4 g/dL (ref 30.0–36.0)
MCV: 84.1 fL (ref 80.0–100.0)
Platelets: 247 10*3/uL (ref 150–400)
RBC: 4.15 MIL/uL — ABNORMAL LOW (ref 4.22–5.81)
RDW: 16.7 % — ABNORMAL HIGH (ref 11.5–15.5)
WBC: 7.1 10*3/uL (ref 4.0–10.5)
nRBC: 0 % (ref 0.0–0.2)

## 2022-11-14 LAB — BASIC METABOLIC PANEL
Anion gap: 9 (ref 5–15)
BUN: 16 mg/dL (ref 6–20)
CO2: 28 mmol/L (ref 22–32)
Calcium: 8.4 mg/dL — ABNORMAL LOW (ref 8.9–10.3)
Chloride: 100 mmol/L (ref 98–111)
Creatinine, Ser: 0.75 mg/dL (ref 0.61–1.24)
GFR, Estimated: >60 mL/min (ref >60)
Glucose, Bld: 101 mg/dL — ABNORMAL HIGH (ref 70–99)
Potassium: 3.8 mmol/L (ref 3.5–5.1)
Sodium: 137 mmol/L (ref 135–145)

## 2022-11-14 LAB — MAGNESIUM: Magnesium: 1.8 mg/dL (ref 1.7–2.4)

## 2022-11-14 MED ORDER — DIPHENHYDRAMINE-ZINC ACETATE 2-0.1 % EX CREA: TOPICAL_CREAM | Freq: Three times a day (TID) | CUTANEOUS | Status: DC | PRN

## 2022-11-14 MED ORDER — DILTIAZEM HCL 60 MG PO TABS
60.0000 mg | ORAL_TABLET | Freq: Four times a day (QID) | ORAL | Status: DC
  Administered 2022-11-14: 60 mg via ORAL
  Filled 2022-11-14: qty 1

## 2022-11-14 MED ORDER — CARVEDILOL 3.125 MG PO TABS
3.1250 mg | ORAL_TABLET | Freq: Two times a day (BID) | ORAL | Status: DC
  Administered 2022-11-14 – 2022-11-15 (×3): 3.125 mg via ORAL
  Filled 2022-11-14 (×3): qty 1

## 2022-11-14 NOTE — Plan of Care (Signed)
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
## 2022-11-14 NOTE — Progress Notes (Signed)
Telemetry just called. Patient had Afib RVR spike to 170 at 855. Went to check current rate. Show AFIB at 102. Notified Dr. Sherryll Burger.

## 2022-11-15 DIAGNOSIS — L03116 Cellulitis of left lower limb: Secondary | ICD-10-CM | POA: Diagnosis not present

## 2022-11-15 DIAGNOSIS — Z23 Encounter for immunization: Secondary | ICD-10-CM | POA: Diagnosis not present

## 2022-11-15 LAB — BASIC METABOLIC PANEL
Anion gap: 6 (ref 5–15)
BUN: 11 mg/dL (ref 6–20)
CO2: 31 mmol/L (ref 22–32)
Calcium: 8.4 mg/dL — ABNORMAL LOW (ref 8.9–10.3)
Chloride: 100 mmol/L (ref 98–111)
Creatinine, Ser: 0.7 mg/dL (ref 0.61–1.24)
GFR, Estimated: >60 mL/min (ref >60)
Glucose, Bld: 96 mg/dL (ref 70–99)
Potassium: 3.7 mmol/L (ref 3.5–5.1)
Sodium: 137 mmol/L (ref 135–145)

## 2022-11-15 LAB — CBC
HCT: 35.6 % — ABNORMAL LOW (ref 39.0–52.0)
Hemoglobin: 11 g/dL — ABNORMAL LOW (ref 13.0–17.0)
MCH: 25.3 pg — ABNORMAL LOW (ref 26.0–34.0)
MCHC: 30.9 g/dL (ref 30.0–36.0)
MCV: 81.8 fL (ref 80.0–100.0)
Platelets: 233 10*3/uL (ref 150–400)
RBC: 4.35 MIL/uL (ref 4.22–5.81)
RDW: 16.2 % — ABNORMAL HIGH (ref 11.5–15.5)
WBC: 5.5 10*3/uL (ref 4.0–10.5)
nRBC: 0 % (ref 0.0–0.2)

## 2022-11-15 LAB — MAGNESIUM: Magnesium: 1.8 mg/dL (ref 1.7–2.4)

## 2022-11-15 LAB — VANCOMYCIN, PEAK
Vancomycin Pk: 13 ug/mL — ABNORMAL LOW (ref 30–40)
Vancomycin Pk: 36 ug/mL (ref 30–40)

## 2022-11-15 LAB — VANCOMYCIN, TROUGH: Vancomycin Tr: 17 ug/mL (ref 15–20)

## 2022-11-15 MED ORDER — LINEZOLID 600 MG PO TABS: 600.0000 mg | ORAL_TABLET | Freq: Two times a day (BID) | ORAL | 0 refills | Status: AC

## 2022-11-15 MED ORDER — CARVEDILOL 3.125 MG PO TABS: 3.1250 mg | ORAL_TABLET | Freq: Two times a day (BID) | ORAL | 1 refills | Status: DC

## 2022-11-15 NOTE — Progress Notes (Signed)
Patient discharged home today, transported home by family. Discharge paperwork went over with patient, patient verbalized understanding. Belongings sent home with patient.  ?

## 2022-11-15 NOTE — Plan of Care (Signed)
Pt alert and oriented x 4. Up in chair during overnight. Oxy given for pain x 2. Legs edematous left more than right. Decrease in redness per pt. Vanc restarted this shift to infuse bag. Pharmayc / lab aware. Next vanc dose shifted and peak order for 0900 dose. Pt had episode of Afib RVR following pt repositioning self in chair.   Problem: Education: Goal: Knowledge of General Education information will improve Description: Including pain rating scale, medication(s)/side effects and non-pharmacologic comfort measures Outcome: Progressing   Problem: Health Behavior/Discharge Planning: Goal: Ability to manage health-related needs will improve Outcome: Progressing   Problem: Clinical Measurements: Goal: Ability to maintain clinical measurements within normal limits will improve Outcome: Progressing Goal: Will remain free from infection Outcome: Progressing Goal: Diagnostic test results will improve Outcome: Progressing Goal: Respiratory complications will improve Outcome: Progressing Goal: Cardiovascular complication will be avoided Outcome: Progressing   Problem: Activity: Goal: Risk for activity intolerance will decrease Outcome: Progressing   Problem: Nutrition: Goal: Adequate nutrition will be maintained Outcome: Progressing   Problem: Coping: Goal: Level of anxiety will decrease Outcome: Progressing   Problem: Elimination: Goal: Will not experience complications related to bowel motility Outcome: Progressing Goal: Will not experience complications related to urinary retention Outcome: Progressing   Problem: Pain Managment: Goal: General experience of comfort will improve Outcome: Progressing   Problem: Safety: Goal: Ability to remain free from injury will improve Outcome: Progressing   Problem: Skin Integrity: Goal: Risk for impaired skin integrity will decrease Outcome: Progressing
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
## 2022-11-16 LAB — CULTURE, BLOOD (ROUTINE X 2)
Culture: NO GROWTH
Culture: NO GROWTH
Special Requests: ADEQUATE

## 2022-11-18 NOTE — Consult Note (Signed)
[facility] Customer service manager [facility]) Accountable Care Organization (ACO) [facility] Liaison Note  11/18/2022  Andrew Haley 1965/04/05 [id number]  Location: [facility] RN Hospital Liaison screened the patient remotely at [facility].  Insurance: [facility] HMO   Andrew Haley is a 57 y.o. male who is a Primary Care Patient of Elfredia Nevins, MD. The patient was screened for  readmission hospitalization with noted high risk score for unplanned readmission risk with 2 IP in 6 months.  The patient was assessed for potential [facility]) Care Management service needs for post hospital transition for care coordination. Review of patient's electronic medical record reveals patient was admitted with Cellulities to the LLE. No anticipated needs as pt was discharged with self care.    [facility] Care Management/Population Health does not replace or interfere with any arrangements made by the Inpatient Transition of Care team.   For questions contact:   Elliot Cousin, RN, [facility] Liaison [place]   Population Health Office Hours MTWF  8:00 am-6:00 pm [phone] mobile [phone] [Office toll free line] Office Hours are M-F 8:30 - 5 pm [email or web address]

## 2022-11-24 DIAGNOSIS — I10 Essential (primary) hypertension: Secondary | ICD-10-CM | POA: Diagnosis not present

## 2022-11-24 DIAGNOSIS — L03116 Cellulitis of left lower limb: Secondary | ICD-10-CM | POA: Diagnosis not present

## 2022-11-24 DIAGNOSIS — E6609 Other obesity due to excess calories: Secondary | ICD-10-CM | POA: Diagnosis not present

## 2022-11-24 DIAGNOSIS — Z6833 Body mass index (BMI) 33.0-33.9, adult: Secondary | ICD-10-CM | POA: Diagnosis not present

## 2022-11-24 DIAGNOSIS — I719 Aortic aneurysm of unspecified site, without rupture: Secondary | ICD-10-CM | POA: Diagnosis not present

## 2022-11-24 DIAGNOSIS — M1991 Primary osteoarthritis, unspecified site: Secondary | ICD-10-CM | POA: Diagnosis not present

## 2022-11-24 DIAGNOSIS — I712 Thoracic aortic aneurysm, without rupture, unspecified: Secondary | ICD-10-CM | POA: Diagnosis not present

## 2022-11-24 DIAGNOSIS — M7989 Other specified soft tissue disorders: Secondary | ICD-10-CM | POA: Diagnosis not present

## 2022-12-11 DIAGNOSIS — L03116 Cellulitis of left lower limb: Secondary | ICD-10-CM | POA: Diagnosis not present

## 2022-12-11 DIAGNOSIS — F14188 Cocaine abuse with other cocaine-induced disorder: Secondary | ICD-10-CM | POA: Diagnosis not present

## 2022-12-11 DIAGNOSIS — I10 Essential (primary) hypertension: Secondary | ICD-10-CM | POA: Diagnosis not present

## 2022-12-11 DIAGNOSIS — M7989 Other specified soft tissue disorders: Secondary | ICD-10-CM | POA: Diagnosis not present

## 2023-01-19 ENCOUNTER — Other Ambulatory Visit: Payer: Self-pay | Admitting: Surgery

## 2023-01-19 DIAGNOSIS — I7121 Aneurysm of the ascending aorta, without rupture: Secondary | ICD-10-CM

## 2023-02-05 ENCOUNTER — Other Ambulatory Visit: Payer: Self-pay | Admitting: Internal Medicine

## 2023-02-05 ENCOUNTER — Ambulatory Visit: Payer: Medicare HMO | Attending: Internal Medicine

## 2023-02-05 DIAGNOSIS — I480 Paroxysmal atrial fibrillation: Secondary | ICD-10-CM

## 2023-02-05 LAB — ECHOCARDIOGRAM LIMITED
Calc EF: 34.5 %
S' Lateral: 5.2 cm
Single Plane A2C EF: 30.9 %
Single Plane A4C EF: 37.9 %

## 2023-02-09 DIAGNOSIS — E6609 Other obesity due to excess calories: Secondary | ICD-10-CM | POA: Diagnosis not present

## 2023-02-09 DIAGNOSIS — A46 Erysipelas: Secondary | ICD-10-CM | POA: Diagnosis not present

## 2023-02-09 DIAGNOSIS — G894 Chronic pain syndrome: Secondary | ICD-10-CM | POA: Diagnosis not present

## 2023-02-09 DIAGNOSIS — Z6838 Body mass index (BMI) 38.0-38.9, adult: Secondary | ICD-10-CM | POA: Diagnosis not present

## 2023-02-13 ENCOUNTER — Encounter (HOSPITAL_COMMUNITY): Payer: Self-pay

## 2023-02-13 ENCOUNTER — Inpatient Hospital Stay (HOSPITAL_COMMUNITY)
Admission: EM | Admit: 2023-02-13 | Discharge: 2023-02-19 | DRG: 871 | Disposition: A | Discharge status: Home / Self Care | Payer: Medicare HMO | Attending: Internal Medicine | Admitting: Internal Medicine

## 2023-02-13 ENCOUNTER — Emergency Department (HOSPITAL_COMMUNITY): Payer: Medicare HMO

## 2023-02-13 ENCOUNTER — Other Ambulatory Visit: Payer: Self-pay

## 2023-02-13 DIAGNOSIS — T374X5A Adverse effect of anthelminthics, initial encounter: Secondary | ICD-10-CM

## 2023-02-13 DIAGNOSIS — E876 Hypokalemia: Secondary | ICD-10-CM

## 2023-02-13 DIAGNOSIS — G894 Chronic pain syndrome: Secondary | ICD-10-CM | POA: Diagnosis present

## 2023-02-13 DIAGNOSIS — F1490 Cocaine use, unspecified, uncomplicated: Secondary | ICD-10-CM | POA: Diagnosis present

## 2023-02-13 DIAGNOSIS — L03116 Cellulitis of left lower limb: Secondary | ICD-10-CM

## 2023-02-13 DIAGNOSIS — I110 Hypertensive heart disease with heart failure: Secondary | ICD-10-CM | POA: Diagnosis not present

## 2023-02-13 DIAGNOSIS — G9341 Metabolic encephalopathy: Secondary | ICD-10-CM | POA: Diagnosis present

## 2023-02-13 DIAGNOSIS — I4811 Longstanding persistent atrial fibrillation: Secondary | ICD-10-CM | POA: Diagnosis not present

## 2023-02-13 DIAGNOSIS — Z8546 Personal history of malignant neoplasm of prostate: Secondary | ICD-10-CM

## 2023-02-13 DIAGNOSIS — I5022 Chronic systolic (congestive) heart failure: Secondary | ICD-10-CM

## 2023-02-13 DIAGNOSIS — E781 Pure hyperglyceridemia: Secondary | ICD-10-CM | POA: Diagnosis present

## 2023-02-13 DIAGNOSIS — G4733 Obstructive sleep apnea (adult) (pediatric): Secondary | ICD-10-CM | POA: Diagnosis present

## 2023-02-13 DIAGNOSIS — I4819 Other persistent atrial fibrillation: Secondary | ICD-10-CM | POA: Diagnosis not present

## 2023-02-13 DIAGNOSIS — L03115 Cellulitis of right lower limb: Secondary | ICD-10-CM | POA: Diagnosis present

## 2023-02-13 DIAGNOSIS — I10 Essential (primary) hypertension: Secondary | ICD-10-CM

## 2023-02-13 DIAGNOSIS — S81801A Unspecified open wound, right lower leg, initial encounter: Secondary | ICD-10-CM | POA: Diagnosis present

## 2023-02-13 DIAGNOSIS — Z6836 Body mass index (BMI) 36.0-36.9, adult: Secondary | ICD-10-CM | POA: Diagnosis not present

## 2023-02-13 DIAGNOSIS — T368X6A Underdosing of other systemic antibiotics, initial encounter: Secondary | ICD-10-CM | POA: Diagnosis present

## 2023-02-13 DIAGNOSIS — F10139 Alcohol abuse with withdrawal, unspecified: Secondary | ICD-10-CM | POA: Diagnosis not present

## 2023-02-13 DIAGNOSIS — L0390 Cellulitis, unspecified: Secondary | ICD-10-CM | POA: Diagnosis present

## 2023-02-13 DIAGNOSIS — J9601 Acute respiratory failure with hypoxia: Secondary | ICD-10-CM | POA: Diagnosis present

## 2023-02-13 DIAGNOSIS — A419 Sepsis, unspecified organism: Secondary | ICD-10-CM | POA: Diagnosis not present

## 2023-02-13 DIAGNOSIS — Z8249 Family history of ischemic heart disease and other diseases of the circulatory system: Secondary | ICD-10-CM

## 2023-02-13 DIAGNOSIS — R600 Localized edema: Secondary | ICD-10-CM | POA: Diagnosis not present

## 2023-02-13 DIAGNOSIS — Z7901 Long term (current) use of anticoagulants: Secondary | ICD-10-CM

## 2023-02-13 DIAGNOSIS — R0902 Hypoxemia: Secondary | ICD-10-CM | POA: Diagnosis not present

## 2023-02-13 DIAGNOSIS — Z6833 Body mass index (BMI) 33.0-33.9, adult: Secondary | ICD-10-CM | POA: Diagnosis not present

## 2023-02-13 DIAGNOSIS — Z9079 Acquired absence of other genital organ(s): Secondary | ICD-10-CM

## 2023-02-13 DIAGNOSIS — X58XXXA Exposure to other specified factors, initial encounter: Secondary | ICD-10-CM | POA: Diagnosis present

## 2023-02-13 DIAGNOSIS — Z96653 Presence of artificial knee joint, bilateral: Secondary | ICD-10-CM | POA: Diagnosis present

## 2023-02-13 DIAGNOSIS — I5023 Acute on chronic systolic (congestive) heart failure: Secondary | ICD-10-CM | POA: Diagnosis not present

## 2023-02-13 DIAGNOSIS — E871 Hypo-osmolality and hyponatremia: Secondary | ICD-10-CM | POA: Diagnosis present

## 2023-02-13 DIAGNOSIS — Z91199 Patient's noncompliance with other medical treatment and regimen due to unspecified reason: Secondary | ICD-10-CM

## 2023-02-13 DIAGNOSIS — M79604 Pain in right leg: Secondary | ICD-10-CM | POA: Diagnosis not present

## 2023-02-13 DIAGNOSIS — Z79899 Other long term (current) drug therapy: Secondary | ICD-10-CM

## 2023-02-13 DIAGNOSIS — I517 Cardiomegaly: Secondary | ICD-10-CM | POA: Diagnosis not present

## 2023-02-13 DIAGNOSIS — A46 Erysipelas: Secondary | ICD-10-CM | POA: Diagnosis not present

## 2023-02-13 DIAGNOSIS — E66811 Obesity, class 1: Secondary | ICD-10-CM | POA: Diagnosis not present

## 2023-02-13 DIAGNOSIS — I776 Arteritis, unspecified: Secondary | ICD-10-CM | POA: Diagnosis present

## 2023-02-13 DIAGNOSIS — E6609 Other obesity due to excess calories: Secondary | ICD-10-CM | POA: Diagnosis not present

## 2023-02-13 DIAGNOSIS — M7989 Other specified soft tissue disorders: Secondary | ICD-10-CM | POA: Diagnosis not present

## 2023-02-13 DIAGNOSIS — Z881 Allergy status to other antibiotic agents status: Secondary | ICD-10-CM

## 2023-02-13 DIAGNOSIS — J9811 Atelectasis: Secondary | ICD-10-CM | POA: Diagnosis not present

## 2023-02-13 DIAGNOSIS — I82562 Chronic embolism and thrombosis of left calf muscular vein: Secondary | ICD-10-CM | POA: Diagnosis not present

## 2023-02-13 DIAGNOSIS — R0989 Other specified symptoms and signs involving the circulatory and respiratory systems: Secondary | ICD-10-CM | POA: Diagnosis not present

## 2023-02-13 DIAGNOSIS — J90 Pleural effusion, not elsewhere classified: Secondary | ICD-10-CM | POA: Diagnosis not present

## 2023-02-13 DIAGNOSIS — M79605 Pain in left leg: Secondary | ICD-10-CM | POA: Diagnosis not present

## 2023-02-13 DIAGNOSIS — F10130 Alcohol abuse with withdrawal, uncomplicated: Secondary | ICD-10-CM | POA: Diagnosis not present

## 2023-02-13 DIAGNOSIS — F1410 Cocaine abuse, uncomplicated: Secondary | ICD-10-CM | POA: Diagnosis not present

## 2023-02-13 LAB — COMPREHENSIVE METABOLIC PANEL
ALT: 63 U/L — ABNORMAL HIGH (ref 0–44)
AST: 25 U/L (ref 15–41)
Albumin: 3.1 g/dL — ABNORMAL LOW (ref 3.5–5.0)
Alkaline Phosphatase: 109 U/L (ref 38–126)
Anion gap: 12 (ref 5–15)
BUN: 19 mg/dL (ref 6–20)
CO2: 33 mmol/L — ABNORMAL HIGH (ref 22–32)
Calcium: 8.5 mg/dL — ABNORMAL LOW (ref 8.9–10.3)
Chloride: 85 mmol/L — ABNORMAL LOW (ref 98–111)
Creatinine, Ser: 1.22 mg/dL (ref 0.61–1.24)
GFR, Estimated: >60 mL/min (ref >60)
Glucose, Bld: 116 mg/dL — ABNORMAL HIGH (ref 70–99)
Potassium: 3.3 mmol/L — ABNORMAL LOW (ref 3.5–5.1)
Sodium: 130 mmol/L — ABNORMAL LOW (ref 135–145)
Total Bilirubin: 1.2 mg/dL (ref 0.0–1.2)
Total Protein: 7.1 g/dL (ref 6.5–8.1)

## 2023-02-13 LAB — CBC WITH DIFFERENTIAL/PLATELET
Abs Immature Granulocytes: 0.1 10*3/uL — ABNORMAL HIGH (ref 0.00–0.07)
Basophils Absolute: 0.1 10*3/uL (ref 0.0–0.1)
Basophils Relative: 1 %
Eosinophils Absolute: 0.3 10*3/uL (ref 0.0–0.5)
Eosinophils Relative: 3 %
HCT: 33.8 % — ABNORMAL LOW (ref 39.0–52.0)
Hemoglobin: 9.9 g/dL — ABNORMAL LOW (ref 13.0–17.0)
Immature Granulocytes: 1 %
Lymphocytes Relative: 7 %
Lymphs Abs: 0.8 10*3/uL (ref 0.7–4.0)
MCH: 23.4 pg — ABNORMAL LOW (ref 26.0–34.0)
MCHC: 29.3 g/dL — ABNORMAL LOW (ref 30.0–36.0)
MCV: 79.9 fL — ABNORMAL LOW (ref 80.0–100.0)
Monocytes Absolute: 0.9 10*3/uL (ref 0.1–1.0)
Monocytes Relative: 7 %
Neutro Abs: 9.5 10*3/uL — ABNORMAL HIGH (ref 1.7–7.7)
Neutrophils Relative %: 81 %
Platelets: 267 10*3/uL (ref 150–400)
RBC: 4.23 MIL/uL (ref 4.22–5.81)
RDW: 18.1 % — ABNORMAL HIGH (ref 11.5–15.5)
WBC: 11.7 10*3/uL — ABNORMAL HIGH (ref 4.0–10.5)
nRBC: 0 % (ref 0.0–0.2)

## 2023-02-13 LAB — PROTIME-INR
INR: 1.3 — ABNORMAL HIGH (ref 0.8–1.2)
Prothrombin Time: 16.7 s — ABNORMAL HIGH (ref 11.4–15.2)

## 2023-02-13 LAB — LACTIC ACID, PLASMA
Lactic Acid, Venous: 1 mmol/L (ref 0.5–1.9)
Lactic Acid, Venous: 1.6 mmol/L (ref 0.5–1.9)

## 2023-02-13 LAB — APTT: aPTT: 33 s (ref 24–36)

## 2023-02-13 LAB — VITAMIN B12: Vitamin B-12: 506 pg/mL (ref 180–914)

## 2023-02-13 MED ORDER — FOLIC ACID 1 MG PO TABS
1.0000 mg | ORAL_TABLET | Freq: Every day | ORAL | Status: DC
  Administered 2023-02-14 – 2023-02-15 (×2): 1 mg via ORAL
  Filled 2023-02-13 (×2): qty 1

## 2023-02-13 MED ORDER — POTASSIUM CHLORIDE CRYS ER 20 MEQ PO TBCR
20.0000 meq | EXTENDED_RELEASE_TABLET | Freq: Every day | ORAL | Status: DC
  Administered 2023-02-14 – 2023-02-19 (×6): 20 meq via ORAL
  Filled 2023-02-13 (×6): qty 1

## 2023-02-13 MED ORDER — HYDRALAZINE HCL 20 MG/ML IJ SOLN: 5.0000 mg | INTRAMUSCULAR | Status: DC | PRN

## 2023-02-13 MED ORDER — POLYETHYLENE GLYCOL 3350 17 G PO PACK: 17.0000 g | PACK | Freq: Every day | ORAL | Status: DC | PRN

## 2023-02-13 MED ORDER — VANCOMYCIN HCL 2000 MG/400ML IV SOLN
2000.0000 mg | Freq: Once | INTRAVENOUS | Status: AC
  Administered 2023-02-13: 2000 mg via INTRAVENOUS
  Filled 2023-02-13: qty 400

## 2023-02-13 MED ORDER — CARVEDILOL 3.125 MG PO TABS
3.1250 mg | ORAL_TABLET | Freq: Two times a day (BID) | ORAL | Status: DC
  Administered 2023-02-13: 3.125 mg via ORAL
  Filled 2023-02-13: qty 1

## 2023-02-13 MED ORDER — SPIRONOLACTONE 12.5 MG HALF TABLET
12.5000 mg | ORAL_TABLET | Freq: Every day | ORAL | Status: DC
  Administered 2023-02-14 – 2023-02-17 (×4): 12.5 mg via ORAL
  Filled 2023-02-13 (×4): qty 1

## 2023-02-13 MED ORDER — MUPIROCIN 2 % EX OINT
1.0000 | TOPICAL_OINTMENT | Freq: Three times a day (TID) | CUTANEOUS | Status: DC
  Administered 2023-02-14 – 2023-02-19 (×16): 1 via TOPICAL
  Filled 2023-02-13 (×3): qty 22

## 2023-02-13 MED ORDER — CARVEDILOL 3.125 MG PO TABS: 3.1250 mg | ORAL_TABLET | Freq: Two times a day (BID) | ORAL | Status: DC

## 2023-02-13 MED ORDER — OXYCODONE HCL 5 MG PO TABS
5.0000 mg | ORAL_TABLET | Freq: Four times a day (QID) | ORAL | Status: DC | PRN
  Administered 2023-02-13 – 2023-02-19 (×10): 5 mg via ORAL
  Filled 2023-02-13 (×10): qty 1

## 2023-02-13 MED ORDER — BISACODYL 5 MG PO TBEC: 5.0000 mg | DELAYED_RELEASE_TABLET | Freq: Every day | ORAL | Status: DC | PRN

## 2023-02-13 MED ORDER — VANCOMYCIN HCL IN DEXTROSE 1-5 GM/200ML-% IV SOLN
1000.0000 mg | Freq: Two times a day (BID) | INTRAVENOUS | Status: DC
  Administered 2023-02-14 – 2023-02-16 (×5): 1000 mg via INTRAVENOUS
  Filled 2023-02-13 (×5): qty 200

## 2023-02-13 MED ORDER — ACETAMINOPHEN 650 MG RE SUPP
650.0000 mg | Freq: Four times a day (QID) | RECTAL | Status: DC | PRN
Start: 2023-02-13 — End: 2023-02-19

## 2023-02-13 MED ORDER — OXYCODONE HCL 10 MG PO TABS: 10.0000 mg | ORAL_TABLET | Freq: Four times a day (QID) | ORAL | Status: DC

## 2023-02-13 MED ORDER — APIXABAN 5 MG PO TABS
5.0000 mg | ORAL_TABLET | Freq: Two times a day (BID) | ORAL | Status: DC
  Administered 2023-02-13 – 2023-02-19 (×12): 5 mg via ORAL
  Filled 2023-02-13 (×12): qty 1

## 2023-02-13 MED ORDER — ACETAMINOPHEN 325 MG PO TABS
650.0000 mg | ORAL_TABLET | Freq: Four times a day (QID) | ORAL | Status: DC | PRN
  Administered 2023-02-15 – 2023-02-18 (×3): 650 mg via ORAL
  Filled 2023-02-13 (×3): qty 2

## 2023-02-13 MED ORDER — POTASSIUM CHLORIDE CRYS ER 20 MEQ PO TBCR
40.0000 meq | EXTENDED_RELEASE_TABLET | Freq: Once | ORAL | Status: AC
  Administered 2023-02-13: 40 meq via ORAL
  Filled 2023-02-13: qty 2

## 2023-02-13 MED ORDER — FUROSEMIDE 40 MG PO TABS
40.0000 mg | ORAL_TABLET | Freq: Every day | ORAL | Status: DC
  Administered 2023-02-13: 40 mg via ORAL
  Filled 2023-02-13: qty 1

## 2023-02-13 MED ORDER — NALOXONE HCL 4 MG/0.1ML NA LIQD: 0.4000 mg | Freq: Once | NASAL | Status: DC | PRN

## 2023-02-13 NOTE — ED Triage Notes (Signed)
 Pt was sent her by his PCP for antibiotic treatment for "cullulitis/lymphitis/edema". Pt's right lower leg is red, edematous, weeping and swollen with open blistering on the anterior side.

## 2023-02-13 NOTE — ED Notes (Signed)
 Pt was taken up stairs with a bag with all of his belongings that he brought here with him, also had his cane with him.
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
## 2023-02-13 NOTE — Progress Notes (Signed)
 Pharmacy Antibiotic Note  Andrew Haley is a 58 y.o. male admitted on 02/13/2023 with cellulitis.  Pharmacy has been consulted for Vancomycin  dosing.  Plan: Vancomycin  2000 mg IV loading dose, then 1000mg  IV Q 12 hrs. Goal AUC 400-550. Expected AUC: 479 SCr used: 1.22  F/U cxs and clinical progress Monitor V/S, labs and levels as indicated  Height: 6' 3 (190.5 cm) Weight: 121.6 kg (268 lb) IBW/kg (Calculated) : 84.5  Temp (24hrs), Avg:98.2 F (36.8 C), Min:98.2 F (36.8 C), Max:98.2 F (36.8 C)  Recent Labs  Lab 02/13/23 1527  WBC 11.7*  CREATININE 1.22  LATICACIDVEN 1.0    Estimated Creatinine Clearance: 91.6 mL/min (by C-G formula based on SCr of 1.22 mg/dL).    Allergies  Allergen Reactions   Ertapenem  Hives   Cefazolin  Hives   Ceftriaxone  Other (See Comments)    Itching, sweating and anxiety with ceftriaxone  .     Antimicrobials this admission: Vancomycin   1/3 >>   Microbiology results: 1/3 BCx: pending  MRSA PCR:   Thank you for allowing pharmacy to be a part of this patient's care.  Damyen Knoll, BS Pharm D, BCPS Clinical Pharmacist 02/13/2023 4:01 PM

## 2023-02-13 NOTE — ED Provider Notes (Signed)
 [place] EMERGENCY DEPARTMENT AT [facility] Provider Note   CSN: [id number] Arrival date & time: 02/13/23  1342     History  Chief Complaint  Patient presents with   Wound Infection    Andrew Haley is a 58 y.o. male.  He is PMH of A-fib on Eliquis , HFrEF, hypertension, sleep apnea, obesity, prostate cancer status post prostatectomy.  He presents ER today for evaluation of right leg swelling, redness and drainage.  He states he has chronic edema in his legs and freely gets cellulitis in his left leg, had admission in October for cellulitis of left lower extremity and was discharged on linezolid  after IV vancomycin  in the hospital.  He got better, states recently came back and the right leg, this is worsened over the past 3 days.  He states he had been on linezolid  but it had not completely resolved, he discussed with his PCP who told him they did not want to extend his treatment but his symptoms have worsened.  He states he has had golden fluid leaking from his lower leg and had some blisters that ruptured on the shin as well.  Denies injury or trauma.  No fevers or chills.  HPI     Home Medications Prior to Admission medications   Medication Sig Start Date End Date Taking? Authorizing Provider  apixaban  (ELIQUIS ) 5 MG TABS tablet Take 1 tablet (5 mg total) by mouth 2 (two) times daily. 08/02/22  Yes Johnson, Clanford L, MD  carvedilol  (COREG ) 3.125 MG tablet Take 1 tablet (3.125 mg total) by mouth 2 (two) times daily with a meal. 11/15/22  Yes Shah, Pratik D, DO  folic acid  (FOLVITE ) 1 MG tablet Take 1 tablet (1 mg total) by mouth daily. 08/03/22  Yes Johnson, Clanford L, MD  furosemide  (LASIX ) 40 MG tablet Take 1 tablet (40 mg total) by mouth daily. Patient taking differently: Take 40 mg by mouth daily as needed for fluid or edema. 08/02/22  Yes Johnson, Clanford L, MD  linezolid  (ZYVOX ) 600 MG tablet Take 600 mg by mouth 2 (two) times daily. 02/09/23  Yes [provider]  losartan  (COZAAR ) 25 MG tablet Take 0.5 tablets (12.5 mg total) by mouth daily. 08/02/22  Yes Johnson, Clanford L, MD  mupirocin  ointment (BACTROBAN ) 2 % Apply 1 Application topically 3 (three) times daily. 02/09/23  Yes [provider]  naloxone  (NARCAN ) nasal spray 4 mg/0.1 mL Spray into nostrils if patient shows signs of opioid overdose, then call 911 03/07/21  Yes Patti Rosina JONELLE, PA-C  Oxycodone  HCl 10 MG TABS Take 10 mg by mouth every 4 (four) hours as needed (pain).   Yes [provider]  potassium chloride  SA (KLOR-CON  M) 20 MEQ tablet Take 1 tablet (20 mEq total) by mouth daily. 08/02/22  Yes Johnson, Clanford L, MD  spironolactone  (ALDACTONE ) 25 MG tablet Take 0.5 tablets (12.5 mg total) by mouth daily. 08/03/22  Yes Johnson, Clanford L, MD      Allergies    Ertapenem , Cefazolin , and Ceftriaxone     Review of Systems   Review of Systems  Physical Exam Updated Vital Signs BP (!) 123/99 (BP Location: Right Arm)   Pulse 62   Temp 98.8 F (37.1 C) (Oral)   Resp 19   Ht 6' 3 (1.905 m)   Wt 121.6 kg   SpO2 (!) 86%   BMI 33.50 kg/m  Physical Exam Vitals and nursing note reviewed.  Constitutional:      General: He  is not in acute distress.    Appearance: He is well-developed.  HENT:     Head: Normocephalic and atraumatic.     Mouth/Throat:     Mouth: Mucous membranes are moist.  Eyes:     Conjunctiva/sclera: Conjunctivae normal.  Cardiovascular:     Rate and Rhythm: Normal rate and regular rhythm.     Heart sounds: No murmur heard. Pulmonary:     Effort: Pulmonary effort is normal. No respiratory distress.     Breath sounds: Normal breath sounds.  Abdominal:     Palpations: Abdomen is soft.     Tenderness: There is no abdominal tenderness.  Musculoskeletal:        General: No swelling.     Cervical back: Neck supple.  Skin:    General: Skin is warm and dry.     Capillary Refill: Capillary refill takes less than 2 seconds.   Neurological:     General: No focal deficit present.     Mental Status: He is alert and oriented to person, place, and time.  Psychiatric:        Mood and Affect: Mood normal.     ED Results / Procedures / Treatments   Labs (all labs ordered are listed, but only abnormal results are displayed) Labs Reviewed  COMPREHENSIVE METABOLIC PANEL - Abnormal; Notable for the following components:      Result Value   Sodium 130 (*)    Potassium 3.3 (*)    Chloride 85 (*)    CO2 33 (*)    Glucose, Bld 116 (*)    Calcium 8.5 (*)    Albumin 3.1 (*)    ALT 63 (*)    All other components within normal limits  CBC WITH DIFFERENTIAL/PLATELET - Abnormal; Notable for the following components:   WBC 11.7 (*)    Hemoglobin 9.9 (*)    HCT 33.8 (*)    MCV 79.9 (*)    MCH 23.4 (*)    MCHC 29.3 (*)    RDW 18.1 (*)    Neutro Abs 9.5 (*)    Abs Immature Granulocytes 0.10 (*)    All other components within normal limits  PROTIME-INR - Abnormal; Notable for the following components:   Prothrombin Time 16.7 (*)    INR 1.3 (*)    All other components within normal limits  CULTURE, BLOOD (ROUTINE X 2)  CULTURE, BLOOD (ROUTINE X 2)  LACTIC ACID, PLASMA  LACTIC ACID, PLASMA  APTT  VITAMIN B12  HIV ANTIBODY (ROUTINE TESTING W REFLEX)  BASIC METABOLIC PANEL  CBC  RAPID URINE DRUG SCREEN, HOSP PERFORMED    EKG EKG Interpretation Date/Time:  Friday February 13 2023 14:33:05 EST Ventricular Rate:  105 PR Interval:    QRS Duration:  120 QT Interval:  342 QTC Calculation: 452 R Axis:   -43  Text Interpretation: Atrial fibrillation RBBB and LAFB increased rate from prior 10/24 Confirmed by Towana Sharper [phone]) on 02/13/2023 2:35:47 PM  Radiology DG Chest Port 1 View Result Date: 02/13/2023 CLINICAL DATA:  Questionable sepsis EXAM: PORTABLE CHEST 1 VIEW COMPARISON:  Chest x-ray 11/11/2022 FINDINGS: Heart is enlarged. The lungs are clear. There is no pleural effusion or pneumothorax. No acute  fractures are identified. IMPRESSION: Cardiomegaly. No acute cardiopulmonary process. Electronically Signed   By: Greig Pique M.D.   On: 02/13/2023 16:47    Procedures Procedures    Medications Ordered in ED Medications  vancomycin  (VANCOCIN ) IVPB 1000 mg/200 mL premix (has no administration in  time range)  acetaminophen  (TYLENOL ) tablet 650 mg (has no administration in time range)    Or  acetaminophen  (TYLENOL ) suppository 650 mg (has no administration in time range)  polyethylene glycol (MIRALAX  / GLYCOLAX ) packet 17 g (has no administration in time range)  bisacodyl  (DULCOLAX) EC tablet 5 mg (has no administration in time range)  hydrALAZINE  (APRESOLINE ) injection 5 mg (has no administration in time range)  apixaban  (ELIQUIS ) tablet 5 mg (5 mg Oral Given 02/13/23 2220)  carvedilol  (COREG ) tablet 3.125 mg (has no administration in time range)  folic acid  (FOLVITE ) tablet 1 mg (has no administration in time range)  furosemide  (LASIX ) tablet 40 mg (40 mg Oral Given 02/13/23 2220)  mupirocin  ointment (BACTROBAN ) 2 % 1 Application (has no administration in time range)  potassium chloride  SA (KLOR-CON  M) CR tablet 20 mEq (has no administration in time range)  spironolactone  (ALDACTONE ) tablet 12.5 mg (has no administration in time range)  naloxone  (NARCAN ) nasal spray 4 mg/0.1 mL (has no administration in time range)  oxyCODONE  (Oxy IR/ROXICODONE ) immediate release tablet 5 mg (5 mg Oral Given 02/13/23 2219)  vancomycin  (VANCOREADY) IVPB 2000 mg/400 mL (0 mg Intravenous Stopped 02/13/23 1752)  potassium chloride  SA (KLOR-CON  M) CR tablet 40 mEq (40 mEq Oral Given 02/13/23 1756)  potassium chloride  SA (KLOR-CON  M) CR tablet 40 mEq (40 mEq Oral Given 02/13/23 2215)    ED Course/ Medical Decision Making/ A&P Clinical Course as of 02/13/23 2349  Fri Feb 13, 2023  6823 58 year old male with history of peripheral edema here with worsening right leg swelling and weeping after having finished course of  antibiotics.  Leg is red and very swollen and weeping serous fluid.  Needs labs and likely will need admission for further treatment [MB]    Clinical Course User Index [MB] Towana Ozell BROCKS, MD                                 Medical Decision Making Differential diagnosis includes but not limited to abscess, cellulitis, DVT, venous stasis, other  ED course: Patient here for right leg redness, superficial skin wound.  Recently on linezolid  but ran out and wounds got worse.  Patient not septic but has needed IV antibiotics in the past, has failed outpatient treatment.  Discussed with Dr. Sherlon who will admit  Amount and/or Complexity of Data Reviewed Labs: ordered. Radiology: ordered.  Risk Prescription drug management. Decision regarding hospitalization.           Final Clinical Impression(s) / ED Diagnoses Final diagnoses:  Cellulitis of right lower extremity    Rx / DC Orders ED Discharge Orders     None         Suellen Sherran DELENA DEVONNA 02/13/23 2349    Towana Ozell BROCKS, MD 02/14/23 [phone]

## 2023-02-14 ENCOUNTER — Inpatient Hospital Stay (HOSPITAL_COMMUNITY): Payer: Medicare HMO

## 2023-02-14 DIAGNOSIS — E876 Hypokalemia: Secondary | ICD-10-CM | POA: Diagnosis not present

## 2023-02-14 DIAGNOSIS — T374X5A Adverse effect of anthelminthics, initial encounter: Secondary | ICD-10-CM

## 2023-02-14 DIAGNOSIS — G894 Chronic pain syndrome: Secondary | ICD-10-CM | POA: Diagnosis not present

## 2023-02-14 DIAGNOSIS — L03116 Cellulitis of left lower limb: Secondary | ICD-10-CM | POA: Diagnosis not present

## 2023-02-14 DIAGNOSIS — I10 Essential (primary) hypertension: Secondary | ICD-10-CM

## 2023-02-14 LAB — HIV ANTIBODY (ROUTINE TESTING W REFLEX): HIV Screen 4th Generation wRfx: NONREACTIVE

## 2023-02-14 LAB — CBC
HCT: 33.2 % — ABNORMAL LOW (ref 39.0–52.0)
Hemoglobin: 9.6 g/dL — ABNORMAL LOW (ref 13.0–17.0)
MCH: 22.9 pg — ABNORMAL LOW (ref 26.0–34.0)
MCHC: 28.9 g/dL — ABNORMAL LOW (ref 30.0–36.0)
MCV: 79 fL — ABNORMAL LOW (ref 80.0–100.0)
Platelets: 254 10*3/uL (ref 150–400)
RBC: 4.2 MIL/uL — ABNORMAL LOW (ref 4.22–5.81)
RDW: 18.3 % — ABNORMAL HIGH (ref 11.5–15.5)
WBC: 11.9 10*3/uL — ABNORMAL HIGH (ref 4.0–10.5)
nRBC: 0 % (ref 0.0–0.2)

## 2023-02-14 LAB — BASIC METABOLIC PANEL
Anion gap: 10 (ref 5–15)
BUN: 16 mg/dL (ref 6–20)
CO2: 32 mmol/L (ref 22–32)
Calcium: 8.4 mg/dL — ABNORMAL LOW (ref 8.9–10.3)
Chloride: 90 mmol/L — ABNORMAL LOW (ref 98–111)
Creatinine, Ser: 1.01 mg/dL (ref 0.61–1.24)
GFR, Estimated: >60 mL/min (ref >60)
Glucose, Bld: 119 mg/dL — ABNORMAL HIGH (ref 70–99)
Potassium: 3.9 mmol/L (ref 3.5–5.1)
Sodium: 132 mmol/L — ABNORMAL LOW (ref 135–145)

## 2023-02-14 LAB — RAPID URINE DRUG SCREEN, HOSP PERFORMED
Amphetamines: NOT DETECTED
Barbiturates: NOT DETECTED
Benzodiazepines: NOT DETECTED
Cocaine: POSITIVE — AB
Opiates: NOT DETECTED
Tetrahydrocannabinol: NOT DETECTED

## 2023-02-14 LAB — C-REACTIVE PROTEIN: CRP: 11.1 mg/dL — ABNORMAL HIGH (ref <1.0)

## 2023-02-14 LAB — MAGNESIUM: Magnesium: 1.6 mg/dL — ABNORMAL LOW (ref 1.7–2.4)

## 2023-02-14 LAB — SEDIMENTATION RATE: Sed Rate: 46 mm/h — ABNORMAL HIGH (ref 0–16)

## 2023-02-14 LAB — BRAIN NATRIURETIC PEPTIDE: B Natriuretic Peptide: 269 pg/mL — ABNORMAL HIGH (ref 0.0–100.0)

## 2023-02-14 LAB — HEPATITIS B SURFACE ANTIGEN: Hepatitis B Surface Ag: NONREACTIVE

## 2023-02-14 MED ORDER — LORAZEPAM 2 MG/ML IJ SOLN
1.0000 mg | Freq: Once | INTRAMUSCULAR | Status: AC
  Administered 2023-02-14: 1 mg via INTRAVENOUS
  Filled 2023-02-14: qty 1

## 2023-02-14 MED ORDER — SODIUM CHLORIDE 0.9 % IV SOLN: INTRAVENOUS | Status: DC

## 2023-02-14 MED ORDER — FUROSEMIDE 10 MG/ML IJ SOLN
40.0000 mg | Freq: Two times a day (BID) | INTRAMUSCULAR | Status: AC
Start: 2023-02-14 — End: 2023-02-15
  Administered 2023-02-14 – 2023-02-15 (×2): 40 mg via INTRAVENOUS
  Filled 2023-02-14 (×2): qty 4

## 2023-02-14 MED ORDER — ALPRAZOLAM 1 MG PO TABS
1.0000 mg | ORAL_TABLET | Freq: Three times a day (TID) | ORAL | Status: DC | PRN
  Administered 2023-02-14: 1 mg via ORAL
  Filled 2023-02-14 (×2): qty 1

## 2023-02-14 MED ORDER — MAGNESIUM SULFATE 2 GM/50ML IV SOLN
2.0000 g | Freq: Once | INTRAVENOUS | Status: AC
  Administered 2023-02-14: 2 g via INTRAVENOUS
  Filled 2023-02-14: qty 50

## 2023-02-14 MED ORDER — METOPROLOL TARTRATE 5 MG/5ML IV SOLN
2.5000 mg | Freq: Four times a day (QID) | INTRAVENOUS | Status: DC | PRN
  Administered 2023-02-14 – 2023-02-16 (×4): 2.5 mg via INTRAVENOUS
  Filled 2023-02-14 (×4): qty 5

## 2023-02-14 MED ORDER — LACTATED RINGERS IV BOLUS
500.0000 mL | Freq: Once | INTRAVENOUS | Status: AC
  Administered 2023-02-14: 500 mL via INTRAVENOUS

## 2023-02-14 MED ORDER — CARVEDILOL 3.125 MG PO TABS
6.2500 mg | ORAL_TABLET | Freq: Two times a day (BID) | ORAL | Status: DC
  Administered 2023-02-14: 6.25 mg via ORAL
  Filled 2023-02-14: qty 2

## 2023-02-14 MED ORDER — CARVEDILOL 12.5 MG PO TABS
25.0000 mg | ORAL_TABLET | Freq: Two times a day (BID) | ORAL | Status: DC
  Administered 2023-02-14 – 2023-02-18 (×8): 25 mg via ORAL
  Filled 2023-02-14 (×8): qty 2

## 2023-02-14 MED ORDER — ORAL CARE MOUTH RINSE
15.0000 mL | OROMUCOSAL | Status: DC | PRN
Start: 2023-02-14 — End: 2023-02-19

## 2023-02-14 NOTE — Progress Notes (Signed)
 Wound care performed on right lower leg. Wet-to-dry dressing. Wound bed cleansed with NS and gauze. Wound bed is weeping yellow fluid with red, pink, and black areas. No odor. 7 4X4 gauze pieces dampened with NS placed to the wound bed. Covered with 4 ABD pads. Wrapped the leg with dry kurlex. Date and initials placed on bandage.
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
## 2023-02-14 NOTE — Progress Notes (Signed)
 Messaged Dr. Lazarus Salines about patient's heart rate remaining elevated between upper 130's and as high as 151.  Dr. Lazarus Salines came to floor to evaluate patient.  Orders received.

## 2023-02-14 NOTE — Progress Notes (Signed)
 Patient had arrived to room 338 prior to this RN's shift.  Upon entering room patient is alert & oriented x 4.  Patient's RLE is edematous, red, hot to touch w/open areas and scabbed areas.  RLE is weeping w/a yellowish drainage.  Patient's LLE is swollen, red, hot  to touch w/no open areas and a small amount of weeping.  Patient reports having some shortness of breath w/exertion.  Patient c/o pain to his BLE and rates the pain at a 8/10.

## 2023-02-14 NOTE — Progress Notes (Signed)
        Date: 02/14/2023  Patient name: Andrew Haley  Medical record number: [id number]  Date of birth: [birth year]    I was asked about this patient who reportedly failed outpatient zyvox ' for cellulitis:  It appears that this was started on 12/30 and this is his THIRD course of antibiotics  The fact that he has had THREE course of zyvox  and the imaging characteristics of lesions are [facility] MORE CONSISTENT with a vasculitis such as is seen with levamisole induced vasculitis  Levimisole is a horse de-worming agent found in essentially ALL of the cocaine in the [place]   I was going to suggest this possibilty as soon as I looked at the imaging          Note the black, vasculitic lesions esp on the right leg that are NOT contiguous this is highly suggestive  Indeed on his admission H&P it states that he does abuse cocaine  Treatment for this if that is what this is would be STOP USING COCAINE since it contains this agent and this will continue to happen  Supportive care  Consideration of steroids  One could biopsy if someone at  [facility] could biopsy it  I have sent ANCA, ESR< CRP, as well as screening for viral hepatides.  

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
## 2023-02-14 NOTE — Plan of Care (Signed)

## 2023-02-15 DIAGNOSIS — E871 Hypo-osmolality and hyponatremia: Secondary | ICD-10-CM

## 2023-02-15 DIAGNOSIS — I5022 Chronic systolic (congestive) heart failure: Secondary | ICD-10-CM

## 2023-02-15 DIAGNOSIS — T374X5A Adverse effect of anthelminthics, initial encounter: Secondary | ICD-10-CM

## 2023-02-15 DIAGNOSIS — F10130 Alcohol abuse with withdrawal, uncomplicated: Secondary | ICD-10-CM

## 2023-02-15 DIAGNOSIS — F1410 Cocaine abuse, uncomplicated: Secondary | ICD-10-CM

## 2023-02-15 DIAGNOSIS — I4811 Longstanding persistent atrial fibrillation: Secondary | ICD-10-CM

## 2023-02-15 LAB — CBC
HCT: 33.9 % — ABNORMAL LOW (ref 39.0–52.0)
Hemoglobin: 9.7 g/dL — ABNORMAL LOW (ref 13.0–17.0)
MCH: 22.9 pg — ABNORMAL LOW (ref 26.0–34.0)
MCHC: 28.6 g/dL — ABNORMAL LOW (ref 30.0–36.0)
MCV: 80 fL (ref 80.0–100.0)
Platelets: 257 10*3/uL (ref 150–400)
RBC: 4.24 MIL/uL (ref 4.22–5.81)
RDW: 18.5 % — ABNORMAL HIGH (ref 11.5–15.5)
WBC: 16.7 10*3/uL — ABNORMAL HIGH (ref 4.0–10.5)
nRBC: 0 % (ref 0.0–0.2)

## 2023-02-15 LAB — BASIC METABOLIC PANEL
Anion gap: 9 (ref 5–15)
BUN: 16 mg/dL (ref 6–20)
CO2: 35 mmol/L — ABNORMAL HIGH (ref 22–32)
Calcium: 8.4 mg/dL — ABNORMAL LOW (ref 8.9–10.3)
Chloride: 89 mmol/L — ABNORMAL LOW (ref 98–111)
Creatinine, Ser: 0.9 mg/dL (ref 0.61–1.24)
GFR, Estimated: >60 mL/min (ref >60)
Glucose, Bld: 140 mg/dL — ABNORMAL HIGH (ref 70–99)
Potassium: 3.8 mmol/L (ref 3.5–5.1)
Sodium: 133 mmol/L — ABNORMAL LOW (ref 135–145)

## 2023-02-15 LAB — MAGNESIUM: Magnesium: 1.9 mg/dL (ref 1.7–2.4)

## 2023-02-15 LAB — HEPATITIS A ANTIBODY, TOTAL: hep A Total Ab: NONREACTIVE

## 2023-02-15 MED ORDER — THIAMINE HCL 100 MG/ML IJ SOLN: 100.0000 mg | Freq: Every day | INTRAMUSCULAR | Status: DC

## 2023-02-15 MED ORDER — ADULT MULTIVITAMIN W/MINERALS CH
1.0000 | ORAL_TABLET | Freq: Every day | ORAL | Status: DC
  Administered 2023-02-15 – 2023-02-19 (×5): 1 via ORAL
  Filled 2023-02-15 (×5): qty 1

## 2023-02-15 MED ORDER — LORAZEPAM 2 MG/ML IJ SOLN
1.0000 mg | INTRAMUSCULAR | Status: AC | PRN
  Administered 2023-02-15 – 2023-02-17 (×3): 2 mg via INTRAVENOUS
  Administered 2023-02-17: 1 mg via INTRAVENOUS
  Administered 2023-02-18: 2 mg via INTRAVENOUS
  Filled 2023-02-15 (×5): qty 1

## 2023-02-15 MED ORDER — MAGNESIUM SULFATE 2 GM/50ML IV SOLN
2.0000 g | Freq: Once | INTRAVENOUS | Status: AC
  Administered 2023-02-15: 2 g via INTRAVENOUS
  Filled 2023-02-15: qty 50

## 2023-02-15 MED ORDER — FOLIC ACID 1 MG PO TABS
1.0000 mg | ORAL_TABLET | Freq: Every day | ORAL | Status: DC
  Administered 2023-02-15 – 2023-02-19 (×5): 1 mg via ORAL
  Filled 2023-02-15 (×5): qty 1

## 2023-02-15 MED ORDER — THIAMINE MONONITRATE 100 MG PO TABS
100.0000 mg | ORAL_TABLET | Freq: Every day | ORAL | Status: DC
  Administered 2023-02-15 – 2023-02-19 (×5): 100 mg via ORAL
  Filled 2023-02-15 (×5): qty 1

## 2023-02-15 MED ORDER — LORAZEPAM 1 MG PO TABS
1.0000 mg | ORAL_TABLET | ORAL | Status: AC | PRN
Start: 2023-02-15 — End: 2023-02-18
  Administered 2023-02-16: 3 mg via ORAL
  Filled 2023-02-15: qty 3

## 2023-02-15 MED ORDER — LORAZEPAM 2 MG/ML IJ SOLN
1.0000 mg | Freq: Once | INTRAMUSCULAR | Status: AC
  Administered 2023-02-15: 1 mg via INTRAVENOUS
  Filled 2023-02-15: qty 1

## 2023-02-15 MED ORDER — ALPRAZOLAM 1 MG PO TABS: 1.0000 mg | ORAL_TABLET | Freq: Three times a day (TID) | ORAL | Status: DC

## 2023-02-15 NOTE — Progress Notes (Signed)
 Patient unable to stay awake at times during shift, appetite good. Pain controlled with PRN oxycodone  PO and tylenol . Dressing changed by this nurse and reinforced, a copious amount of drainage noted, patient incontinent at times, placed malewick due to him not knowing when he has to void or not.

## 2023-02-15 NOTE — Progress Notes (Signed)
 Patient in Afib and his heart rate is maintaining upper 130's to 140's.  PRN Metoprolol given.

## 2023-02-15 NOTE — Plan of Care (Signed)
   Problem: Education: Goal: Knowledge of General Education information will improve Description Including pain rating scale, medication(s)/side effects and non-pharmacologic comfort measures Outcome: Progressing   Problem: Health Behavior/Discharge Planning: Goal: Ability to manage health-related needs will improve Outcome: Progressing

## 2023-02-15 NOTE — Progress Notes (Signed)
   02/15/23 0734  Assess: MEWS Score  ECG Heart Rate (!) 114  Resp (!) 36  Assess: MEWS Score  MEWS Temp 0  MEWS Systolic 0  MEWS Pulse 2  MEWS RR 3  MEWS LOC 0  MEWS Score 5  MEWS Score Color Red  Assess: if the MEWS score is Yellow or Red  Were vital signs accurate and taken at a resting state? Yes  Does the patient meet 2 or more of the SIRS criteria? No  MEWS guidelines implemented  Yes, red  Treat  MEWS Interventions Considered administering scheduled or prn medications/treatments as ordered  Take Vital Signs  Increase Vital Sign Frequency  Red: Q1hr x2, continue Q4hrs until patient remains green for 12hrs  Escalate  MEWS: Escalate Red: Discuss with charge nurse and notify provider. Consider notifying RRT. If remains red for 2 hours consider need for higher level of care  Notify: Charge Nurse/RN  Name of Charge Nurse/RN Notified [facility]  Provider Notification  Provider Name/Title Dr. Willette  Date Provider Notified 02/15/23  Time Provider Notified [phone]  Method of Notification Page  Notification Reason Change in status  Provider response Other (Comment)  Assess: SIRS CRITERIA  SIRS Temperature  0  SIRS Respirations  1  SIRS Pulse 1  SIRS WBC 1  SIRS Score Sum  3
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
## 2023-02-15 NOTE — Progress Notes (Signed)
 Patient refusing dressing change at this time.  Stated that he would wait until the next shift came in.

## 2023-02-15 NOTE — Progress Notes (Signed)
   02/15/23 1100  CIWA-Ar  Nausea and Vomiting 0  Tactile Disturbances 0  Tremor 0  Auditory Disturbances 0  Paroxysmal Sweats 0  Visual Disturbances 0  Anxiety 0  Headache, Fullness in Head 0  Agitation 0  Orientation and Clouding of Sensorium 0  CIWA-Ar Total 0

## 2023-02-15 NOTE — Plan of Care (Signed)
  Problem: Acute Rehab PT Goals(only PT should resolve) Goal: Pt Will Go Supine/Side To Sit Flowsheets (Taken 02/15/2023 1204) Pt will go Supine/Side to Sit: Independently Goal: Patient Will Transfer Sit To/From Stand Flowsheets (Taken 02/15/2023 1204) Patient will transfer sit to/from stand: Independently Goal: Pt Will Perform Standing Balance Or Pre-Gait Flowsheets (Taken 02/15/2023 1204) Pt will perform standing balance or pre-gait: Independently Goal: Pt Will Ambulate Flowsheets (Taken 02/15/2023 1204) Pt will Ambulate:  75 feet  with modified independence  with least restrictive assistive device  Andrew Haley PT, DPT [facility]- [place] 336 [phone] office
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
## 2023-02-15 NOTE — Progress Notes (Signed)
   02/15/23 1729  Assess: MEWS Score  Temp 98.1 F (36.7 C)  BP 97/73  MAP (mmHg) 81  Pulse Rate 92  SpO2 100 %  O2 Device Room Air  Assess: MEWS Score  MEWS Temp 0  MEWS Systolic 1  MEWS Pulse 0  MEWS RR 2  MEWS LOC 0  MEWS Score 3  MEWS Score Color Yellow  Assess: SIRS CRITERIA  SIRS Temperature  0  SIRS Respirations  1  SIRS Pulse 1  SIRS WBC 0  SIRS Score Sum  2

## 2023-02-15 NOTE — Consult Note (Signed)
 WOC Nurse Consult Note: Reason for Consult:LE wounds  Wound type: full thickness and partial thickness LE wounds; ruddy; but clean. Unclear etiology drug induced vasculitis vs venous  Pressure Injury POA: /NA Measurement: see nursing flow sheets Wound bed:see above, ruddy, pink/red Drainage (amount, consistency, odor) none documented  Periwound: intact  Dressing procedure/placement/frequency: Cleanse LEs with saline, pat dry. Apply single layer of xeroform gauze, top with dry dressing. Wrap with kerlix and ACE wraps change daily    Consider referral to wound care center of  patient's choice for complete work up and dx.   Discussed POC with patient and bedside nurse.  Re consult if needed, will not follow at this time. Thanks  Lalita [employer]bel [employer], RN,CWOCN, CNS, CWON-AP [phone])

## 2023-02-15 NOTE — Progress Notes (Signed)
   02/15/23 0845  Vitals  Temp 99.7 F (37.6 C)  Temp Source Oral  BP 124/76  MAP (mmHg) 91  BP Location Left Arm  BP Method Automatic  Patient Position (if appropriate) Lying  Pulse Rate (!) 128  Pulse Rate Source Dinamap  MEWS COLOR  MEWS Score Color Red  Oxygen Therapy  SpO2 91 %  O2 Device Room Air  Pain Assessment  Pain Score 0  Pain Type Acute pain  Pain Location Leg  Pain Orientation Right  MEWS Score  MEWS Temp 0  MEWS Systolic 0  MEWS Pulse 2  MEWS RR 3  MEWS LOC 0  MEWS Score 5

## 2023-02-16 ENCOUNTER — Inpatient Hospital Stay (HOSPITAL_COMMUNITY): Payer: Medicare HMO

## 2023-02-16 DIAGNOSIS — A419 Sepsis, unspecified organism: Principal | ICD-10-CM

## 2023-02-16 LAB — BASIC METABOLIC PANEL
Anion gap: 9 (ref 5–15)
BUN: 21 mg/dL — ABNORMAL HIGH (ref 6–20)
CO2: 31 mmol/L (ref 22–32)
Calcium: 8.4 mg/dL — ABNORMAL LOW (ref 8.9–10.3)
Chloride: 92 mmol/L — ABNORMAL LOW (ref 98–111)
Creatinine, Ser: 0.89 mg/dL (ref 0.61–1.24)
GFR, Estimated: >60 mL/min (ref >60)
Glucose, Bld: 126 mg/dL — ABNORMAL HIGH (ref 70–99)
Potassium: 4.6 mmol/L (ref 3.5–5.1)
Sodium: 132 mmol/L — ABNORMAL LOW (ref 135–145)

## 2023-02-16 LAB — ANCA PROFILE
Anti-MPO Antibodies: <0.2 U (ref 0.0–0.9)
Anti-PR3 Antibodies: <0.2 U (ref 0.0–0.9)
Atypical P-ANCA titer: <1:20 {titer}
C-ANCA: <1:20 {titer}
P-ANCA: <1:20 {titer}

## 2023-02-16 LAB — RESPIRATORY PANEL BY PCR

## 2023-02-16 LAB — CBC
HCT: 34 % — ABNORMAL LOW (ref 39.0–52.0)
Hemoglobin: 10 g/dL — ABNORMAL LOW (ref 13.0–17.0)
MCH: 23.6 pg — ABNORMAL LOW (ref 26.0–34.0)
MCHC: 29.4 g/dL — ABNORMAL LOW (ref 30.0–36.0)
MCV: 80.4 fL (ref 80.0–100.0)
Platelets: 241 10*3/uL (ref 150–400)
RBC: 4.23 MIL/uL (ref 4.22–5.81)
RDW: 18.7 % — ABNORMAL HIGH (ref 11.5–15.5)
WBC: 16.9 10*3/uL — ABNORMAL HIGH (ref 4.0–10.5)
nRBC: 0 % (ref 0.0–0.2)

## 2023-02-16 LAB — APTT: aPTT: 30 s (ref 24–36)

## 2023-02-16 LAB — PROCALCITONIN: Procalcitonin: <0.1 ng/mL

## 2023-02-16 LAB — PROTIME-INR
INR: 1.3 — ABNORMAL HIGH (ref 0.8–1.2)
Prothrombin Time: 16.1 s — ABNORMAL HIGH (ref 11.4–15.2)

## 2023-02-16 LAB — BRAIN NATRIURETIC PEPTIDE: B Natriuretic Peptide: 378 pg/mL — ABNORMAL HIGH (ref 0.0–100.0)

## 2023-02-16 LAB — BLOOD GAS, VENOUS
Acid-Base Excess: 12.4 mmol/L — ABNORMAL HIGH (ref 0.0–2.0)
Bicarbonate: 39.9 mmol/L — ABNORMAL HIGH (ref 20.0–28.0)
Drawn by: 6509
O2 Saturation: 58.2 %
Patient temperature: 37.2
pCO2, Ven: 63 mm[Hg] — ABNORMAL HIGH (ref 44–60)
pH, Ven: 7.41 (ref 7.25–7.43)
pO2, Ven: 36 mm[Hg] (ref 32–45)

## 2023-02-16 LAB — LACTIC ACID, PLASMA
Lactic Acid, Venous: 1.2 mmol/L (ref 0.5–1.9)
Lactic Acid, Venous: 0.8 mmol/L (ref 0.5–1.9)
Lactic Acid, Venous: 0.9 mmol/L (ref 0.5–1.9)

## 2023-02-16 LAB — MRSA NEXT GEN BY PCR, NASAL: MRSA by PCR Next Gen: NOT DETECTED

## 2023-02-16 MED ORDER — HYDROMORPHONE HCL 1 MG/ML IJ SOLN
0.5000 mg | Freq: Once | INTRAMUSCULAR | Status: AC
  Administered 2023-02-16: 0.5 mg via INTRAVENOUS
  Filled 2023-02-16: qty 1

## 2023-02-16 MED ORDER — LACTATED RINGERS IV SOLN
150.0000 mL/h | INTRAVENOUS | Status: AC
  Administered 2023-02-16: 150 mL/h via INTRAVENOUS

## 2023-02-16 MED ORDER — ONDANSETRON HCL 4 MG/2ML IJ SOLN
INTRAMUSCULAR | Status: AC
  Filled 2023-02-16: qty 2

## 2023-02-16 MED ORDER — LACTATED RINGERS IV BOLUS (SEPSIS)
1000.0000 mL | Freq: Once | INTRAVENOUS | Status: AC
  Administered 2023-02-16: 1000 mL via INTRAVENOUS

## 2023-02-16 MED ORDER — LACTATED RINGERS IV BOLUS (SEPSIS): 1000.0000 mL | Freq: Once | INTRAVENOUS | Status: DC

## 2023-02-16 MED ORDER — METOPROLOL TARTRATE 5 MG/5ML IV SOLN: 2.5000 mg | Freq: Once | INTRAVENOUS | Status: DC

## 2023-02-16 MED ORDER — VANCOMYCIN HCL 1250 MG/250ML IV SOLN
1250.0000 mg | Freq: Two times a day (BID) | INTRAVENOUS | Status: DC
  Administered 2023-02-16 – 2023-02-19 (×6): 1250 mg via INTRAVENOUS
  Filled 2023-02-16 (×6): qty 250

## 2023-02-16 MED ORDER — ONDANSETRON HCL 4 MG/2ML IJ SOLN: 4.0000 mg | Freq: Four times a day (QID) | INTRAMUSCULAR | Status: DC | PRN

## 2023-02-16 MED ORDER — LACTATED RINGERS IV SOLN: 150.0000 mL/h | INTRAVENOUS | Status: DC

## 2023-02-16 MED ORDER — CHLORHEXIDINE GLUCONATE CLOTH 2 % EX PADS
6.0000 | MEDICATED_PAD | Freq: Every day | CUTANEOUS | Status: DC
Start: 2023-02-16 — End: 2023-02-19
  Administered 2023-02-16 – 2023-02-19 (×4): 6 via TOPICAL

## 2023-02-16 MED ORDER — PIPERACILLIN-TAZOBACTAM 3.375 G IVPB
3.3750 g | Freq: Three times a day (TID) | INTRAVENOUS | Status: DC
  Administered 2023-02-16 – 2023-02-19 (×9): 3.375 g via INTRAVENOUS
  Filled 2023-02-16 (×9): qty 50

## 2023-02-16 MED ORDER — PIPERACILLIN-TAZOBACTAM 3.375 G IVPB 30 MIN
3.3750 g | Freq: Once | INTRAVENOUS | Status: AC
  Administered 2023-02-16: 3.375 g via INTRAVENOUS
  Filled 2023-02-16: qty 50

## 2023-02-16 MED ORDER — SODIUM CHLORIDE 0.9 % IV SOLN: 2.0000 g | Freq: Once | INTRAVENOUS | Status: DC

## 2023-02-16 MED ORDER — FUROSEMIDE 10 MG/ML IJ SOLN
40.0000 mg | Freq: Once | INTRAMUSCULAR | Status: AC
  Administered 2023-02-16: 40 mg via INTRAVENOUS
  Filled 2023-02-16: qty 4

## 2023-02-16 MED ORDER — METOPROLOL TARTRATE 5 MG/5ML IV SOLN
5.0000 mg | Freq: Once | INTRAVENOUS | Status: AC
  Administered 2023-02-16: 5 mg via INTRAVENOUS
  Filled 2023-02-16: qty 5
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
## 2023-02-16 NOTE — Progress Notes (Signed)
 Attempt to page MD Shahmehdi @ 1725 regarding pt continually lethargic. At this time no response. Voicemail then left @ 1810. Will continue to monitor.

## 2023-02-16 NOTE — Progress Notes (Signed)
 Code Sepsis this AM. Swot nurse and Elink notified to assist this RN in following proper protocols. IVF Bolus X2 given. Lactic/ and blood cultures pending. Zoysn administered. Per Elink RN nothing further needed from RN stand point at this time.

## 2023-02-16 NOTE — TOC Progression Note (Signed)
 Transition of Care [facility]) - Progression Note    Patient Details  Name: Andrew Haley MRN: [id number] Date of Birth: 1965-04-05  Transition of [facility]) CM/SW Contact  Sharlyne Stabs, RN Phone Number: 02/16/2023, 5:33 PM  Clinical Narrative:   Patient was moved to ICU for sepsis. CM spoke with his friend, Vickye. CM asked if patient was still with Gattis on the chart, Vickye thinks she is jail. Patient works on a farm for room and board, He has a car, but no permit. Rusty checks on him daily and tried to help him realized he needs to leave drugs alone. He has friends that come around that he should not be with. Vickye will come visit tomorrow and encourage patient to go to SNF. TOC following to speak with patient.    Expected Discharge Plan: Skilled Nursing Facility Barriers to Discharge: No SNF bed, Continued Medical Work up  Expected Discharge Plan and Services       Social Determinants of Health (SDOH) Interventions SDOH Screenings   Food Insecurity: No Food Insecurity (02/13/2023)  Housing: Low Risk  (02/13/2023)  Transportation Needs: No Transportation Needs (02/13/2023)  Utilities: Not At Risk (02/13/2023)  Tobacco Use: Low Risk  (02/13/2023)    Readmission Risk Interventions    02/16/2023    5:33 PM 02/14/2023    3:04 PM 11/13/2022    1:23 PM  Readmission Risk Prevention Plan  Transportation Screening  Complete Complete  PCP or Specialist Appt within 3-5 Days  Complete Not Complete  HRI or Home Care Consult  Complete Complete  Social Work Consult for Recovery Care Planning/Counseling  Complete Complete  Palliative Care Screening  Complete Not Applicable  Medication Review [employer])  Complete Complete  HRI or Home Care Consult Complete    SW Recovery Care/Counseling Consult Complete    Palliative Care Screening Not Applicable    Skilled Nursing Facility Not Complete

## 2023-02-16 NOTE — Significant Event (Addendum)
 RRT for Afib with RVR, RR 35, desat during move into bed to 88%, now requiring 2L O2. Pt has no complaints. VS BP 110/80s, HR 150s, RR 35, 2L with normal sat. On exam he has rales in the bases, later is actively vomiting, lower ext remain with 3+ pitting edema. On labs renal function appears improving w recent diuresis. Bicarb uptrending but suspect he has some degree of chronic hypercarbic failure + compensation. WBC 16, about stable.   A/P:   Afib with RVR  AHRF requiring 2L  - Metoprolol  2.5 mg IV x 1 for RVR  - Diuresis with lasix  40 mg IV x 1 - Check Lactate, VBG, BNP  - CXR to eval for pulm edema / infiltrates  - Zofran  for vomiting. NPO with sips for now  - Transfer to stepdown   Update:  Lactate wnl, VBG compensated hypercarbia. CXR with low volume, bibasilar atelectasis and tiny R pleural effusion. After move to stepdown, febrile to 103, RR 40s, BP 110/80s, on 2L o2. Ordered additional blood cultures, RVP for further eval. Giving tylenol  for fever, Metop 5 mg IV for RVR, and will give coreg  early.   Andrew Dawson, MD  [facility]

## 2023-02-16 NOTE — Plan of Care (Signed)
 Patient with eventful day. Sepsis activation this morning, total 2L LR bolus given, abx, lactic , and blood cultures pending. Throughout shift pt intermittently agitated, with tachypnea, and tachycardia. CIWA protocol followed. AFIB now more controlled in 110s but patient now lethargic and less responsive. Last dose ativan  around 1330. Attempt to notify MD Shahmehdi X2.   Problem: Education: Goal: Knowledge of General Education information will improve Description: Including pain rating scale, medication(s)/side effects and non-pharmacologic comfort measures Outcome: Progressing   Problem: Health Behavior/Discharge Planning: Goal: Ability to manage health-related needs will improve Outcome: Progressing   Problem: Clinical Measurements: Goal: Will remain free from infection Outcome: Progressing Goal: Diagnostic test results will improve Outcome: Progressing Goal: Respiratory complications will improve Outcome: Progressing Goal: Cardiovascular complication will be avoided Outcome: Progressing   Problem: Nutrition: Goal: Adequate nutrition will be maintained Outcome: Progressing   Problem: Elimination: Goal: Will not experience complications related to bowel motility Outcome: Progressing Goal: Will not experience complications related to urinary retention Outcome: Progressing   Problem: Pain Management: Goal: General experience of comfort will improve Outcome: Progressing   Problem: Safety: Goal: Ability to remain free from injury will improve Outcome: Progressing   Problem: Skin Integrity: Goal: Risk for impaired skin integrity will decrease Outcome: Progressing   Problem: Fluid Volume: Goal: Hemodynamic stability will improve Outcome: Progressing   Problem: Clinical Measurements: Goal: Diagnostic test results will improve Outcome: Progressing Goal: Signs and symptoms of infection will decrease Outcome: Progressing   Problem: Respiratory: Goal: Ability to maintain  adequate ventilation will improve Outcome: Progressing

## 2023-02-16 NOTE — Progress Notes (Signed)
 PT Cancellation Note  Patient Details Name: Andrew Haley MRN: [id number] DOB: 1965/09/20   Cancelled Treatment:    Reason Eval/Treat Not Completed: Medical issues which prohibited therapy.  Patient transferred to a higher level of care and will need new PT consult to resume therapy when patient is medically stable.  Thank you.\   9:13 AM, 02/16/23 Lynwood Music, MPT Physical Therapist with [facility] 336 [phone] office [phone] mobile phone

## 2023-02-16 NOTE — Progress Notes (Signed)
eLink monitoring code sepsis.  

## 2023-02-16 NOTE — Progress Notes (Addendum)
 Pharmacy Antibiotic Note  Andrew Haley is a 58 y.o. male admitted on 02/13/2023 with cellulitis.  Pharmacy has been consulted for Vancomycin  and zosyn  dosing.  Vasculitis/cellulitis likely d/t cocaine use per ID. MD concerned with continued increased WBC and fever. Adding zosyn  to regimen( will watch closely d/t allergies but tolerated 07/2020) Renal function improved , will adjust vancomycin .  Plan: Increase Vancomycin  1250mg  IV Q 12 hrs. Goal AUC 400-550. Expected AUC: 448 SCr used: 1.22> 0.89  F/U cxs and clinical progress Zosyn  3.375gm IV over 30 min initially, then q8h with EID x 4 hours Monitor V/S, labs and levels as indicated   Height: 6' 3 (190.5 cm) Weight: 119.8 kg (264 lb 1.8 oz) IBW/kg (Calculated) : 84.5  Temp (24hrs), Avg:99 F (37.2 C), Min:97.5 F (36.4 C), Max:102.7 F (39.3 C)  Recent Labs  Lab 02/13/23 1527 02/13/23 1629 02/14/23 0435 02/15/23 0426 02/16/23 0506 02/16/23 0554 02/16/23 0847  WBC 11.7*  --  11.9* 16.7* 16.9*  --   --   CREATININE 1.22  --  1.01 0.90 0.89  --   --   LATICACIDVEN 1.0 1.6  --   --   --  1.2 0.8    Estimated Creatinine Clearance: 124.6 mL/min (by C-G formula based on SCr of 0.89 mg/dL).    Allergies  Allergen Reactions   Ertapenem  Hives   Cefazolin  Hives   Ceftriaxone  Other (See Comments)    Itching, sweating and anxiety with ceftriaxone  .     Antimicrobials this admission: Vancomycin   1/3 >>  zosyn  1/6>>   Microbiology results: 1/3 BCx: ngtd 1/6 BCX: pending  1/6 MRSA PCR: neg  Thank you for allowing pharmacy to be a part of this patient's care.  Onalee Steinbach, BS Pharm D, BCPS Clinical Pharmacist 02/16/2023 9:06 AM

## 2023-02-17 DIAGNOSIS — A419 Sepsis, unspecified organism: Secondary | ICD-10-CM | POA: Diagnosis not present

## 2023-02-17 LAB — BASIC METABOLIC PANEL
Anion gap: 6 (ref 5–15)
BUN: 21 mg/dL — ABNORMAL HIGH (ref 6–20)
CO2: 35 mmol/L — ABNORMAL HIGH (ref 22–32)
Calcium: 8.2 mg/dL — ABNORMAL LOW (ref 8.9–10.3)
Chloride: 90 mmol/L — ABNORMAL LOW (ref 98–111)
Creatinine, Ser: 0.72 mg/dL (ref 0.61–1.24)
GFR, Estimated: >60 mL/min (ref >60)
Glucose, Bld: 88 mg/dL (ref 70–99)
Potassium: 4.2 mmol/L (ref 3.5–5.1)
Sodium: 131 mmol/L — ABNORMAL LOW (ref 135–145)

## 2023-02-17 LAB — CBC
HCT: 32 % — ABNORMAL LOW (ref 39.0–52.0)
Hemoglobin: 9.2 g/dL — ABNORMAL LOW (ref 13.0–17.0)
MCH: 23.7 pg — ABNORMAL LOW (ref 26.0–34.0)
MCHC: 28.8 g/dL — ABNORMAL LOW (ref 30.0–36.0)
MCV: 82.5 fL (ref 80.0–100.0)
Platelets: 188 10*3/uL (ref 150–400)
RBC: 3.88 MIL/uL — ABNORMAL LOW (ref 4.22–5.81)
RDW: 18.6 % — ABNORMAL HIGH (ref 11.5–15.5)
WBC: 8 10*3/uL (ref 4.0–10.5)
nRBC: 0 % (ref 0.0–0.2)

## 2023-02-17 LAB — HCV AB W REFLEX TO QUANT PCR: HCV Ab: NONREACTIVE

## 2023-02-17 LAB — HCV INTERPRETATION

## 2023-02-17 LAB — HEPATITIS B SURFACE ANTIBODY, QUANTITATIVE: Hep B S AB Quant (Post): <3.5 m[IU]/mL — ABNORMAL LOW

## 2023-02-17 MED ORDER — SODIUM CHLORIDE 0.9 % IV SOLN: INTRAVENOUS | Status: AC | PRN

## 2023-02-17 MED ORDER — MIDODRINE HCL 5 MG PO TABS
5.0000 mg | ORAL_TABLET | Freq: Three times a day (TID) | ORAL | Status: DC
  Administered 2023-02-17 – 2023-02-19 (×6): 5 mg via ORAL
  Filled 2023-02-17 (×7): qty 1

## 2023-02-17 NOTE — Plan of Care (Signed)
  Problem: Clinical Measurements: Goal: Ability to maintain clinical measurements within normal limits will improve Outcome: Progressing Goal: Respiratory complications will improve Outcome: Progressing   Problem: Nutrition: Goal: Adequate nutrition will be maintained Outcome: Progressing   Problem: Coping: Goal: Level of anxiety will decrease Outcome: Progressing   Problem: Pain Management: Goal: General experience of comfort will improve Outcome: Progressing   Problem: Safety: Goal: Ability to remain free from injury will improve Outcome: Progressing   Problem: Respiratory: Goal: Ability to maintain adequate ventilation will improve Outcome: Progressing
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
## 2023-02-17 NOTE — Progress Notes (Signed)
 Pt contact person and friend Marlow Baars came by and took pt money bag home with him for safekeeping.

## 2023-02-17 NOTE — Plan of Care (Signed)
 Patient remains on AP-2B at time of writing. Patient is still requiring 2 L / min of supplemental O2 via [place]. Patient continues to receive IV antibiotics.    Problem: Education: Goal: Knowledge of General Education information will improve Description: Including pain rating scale, medication(s)/side effects and non-pharmacologic comfort measures Outcome: Not Progressing   Problem: Health Behavior/Discharge Planning: Goal: Ability to manage health-related needs will improve Outcome: Not Progressing   Problem: Clinical Measurements: Goal: Ability to maintain clinical measurements within normal limits will improve Outcome: Not Progressing Goal: Will remain free from infection Outcome: Not Progressing Goal: Diagnostic test results will improve Outcome: Not Progressing Goal: Respiratory complications will improve Outcome: Not Progressing Goal: Cardiovascular complication will be avoided Outcome: Not Progressing   Problem: Activity: Goal: Risk for activity intolerance will decrease Outcome: Not Progressing   Problem: Nutrition: Goal: Adequate nutrition will be maintained Outcome: Not Progressing   Problem: Coping: Goal: Level of anxiety will decrease Outcome: Not Progressing   Problem: Elimination: Goal: Will not experience complications related to bowel motility Outcome: Not Progressing Goal: Will not experience complications related to urinary retention Outcome: Not Progressing   Problem: Pain Management: Goal: General experience of comfort will improve Outcome: Not Progressing   Problem: Safety: Goal: Ability to remain free from injury will improve Outcome: Not Progressing   Problem: Skin Integrity: Goal: Risk for impaired skin integrity will decrease Outcome: Not Progressing   Problem: Fluid Volume: Goal: Hemodynamic stability will improve Outcome: Not Progressing   Problem: Clinical Measurements: Goal: Diagnostic test results will improve Outcome: Not  Progressing Goal: Signs and symptoms of infection will decrease Outcome: Not Progressing   Problem: Respiratory: Goal: Ability to maintain adequate ventilation will improve Outcome: Not Progressing   Problem: Education: Goal: Ability to demonstrate management of disease process will improve Outcome: Not Progressing Goal: Ability to verbalize understanding of medication therapies will improve Outcome: Not Progressing Goal: Individualized Educational Video(s) Outcome: Not Progressing   Problem: Activity: Goal: Capacity to carry out activities will improve Outcome: Not Progressing   Problem: Cardiac: Goal: Ability to achieve and maintain adequate cardiopulmonary perfusion will improve Outcome: Not Progressing

## 2023-02-17 NOTE — TOC Progression Note (Signed)
 Transition of Care [facility]) - Progression Note    Patient Details  Name: Andrew Haley MRN: [id number] Date of Birth: [birth year]  [facility]) CM/SW Contact  Sharlyne Stabs, RN Phone Number: 02/17/2023, 1:23 PM  Clinical Narrative:   Patient friend Vickye will come this afternoon to discuss SNF with patient, RN updated. TOC following to meet with patient and friend.    Expected Discharge Plan: Skilled Nursing Facility Barriers to Discharge: No SNF bed, Continued Medical Work up  Expected Discharge Plan and Services      Social Determinants of Health (SDOH) Interventions SDOH Screenings   Food Insecurity: No Food Insecurity (02/13/2023)  Housing: Low Risk  (02/13/2023)  Transportation Needs: No Transportation Needs (02/13/2023)  Utilities: Not At Risk (02/13/2023)  Tobacco Use: Low Risk  (02/13/2023)    Readmission Risk Interventions    02/16/2023    5:33 PM 02/14/2023    3:04 PM 11/13/2022    1:23 PM  Readmission Risk Prevention Plan  Transportation Screening  Complete Complete  PCP or Specialist Appt within 3-5 Days  Complete Not Complete  HRI or Home Care Consult  Complete Complete  Social Work Consult for Recovery Care Planning/Counseling  Complete Complete  Palliative Care Screening  Complete Not Applicable  Medication Review Oceanographer)  Complete Complete  HRI or Home Care Consult Complete    SW Recovery Care/Counseling Consult Complete    Palliative Care Screening Not Applicable    Skilled Nursing Facility Not Complete

## 2023-02-18 DIAGNOSIS — I4819 Other persistent atrial fibrillation: Secondary | ICD-10-CM

## 2023-02-18 DIAGNOSIS — T374X5A Adverse effect of anthelminthics, initial encounter: Secondary | ICD-10-CM | POA: Diagnosis not present

## 2023-02-18 DIAGNOSIS — I5022 Chronic systolic (congestive) heart failure: Secondary | ICD-10-CM

## 2023-02-18 DIAGNOSIS — I5023 Acute on chronic systolic (congestive) heart failure: Secondary | ICD-10-CM

## 2023-02-18 DIAGNOSIS — A419 Sepsis, unspecified organism: Secondary | ICD-10-CM | POA: Diagnosis not present

## 2023-02-18 DIAGNOSIS — E876 Hypokalemia: Secondary | ICD-10-CM | POA: Diagnosis not present

## 2023-02-18 LAB — CULTURE, BLOOD (ROUTINE X 2)
Special Requests: ADEQUATE
Special Requests: ADEQUATE

## 2023-02-18 LAB — BASIC METABOLIC PANEL
Anion gap: 7 (ref 5–15)
BUN: 18 mg/dL (ref 6–20)
CO2: 35 mmol/L — ABNORMAL HIGH (ref 22–32)
Calcium: 8.2 mg/dL — ABNORMAL LOW (ref 8.9–10.3)
Chloride: 91 mmol/L — ABNORMAL LOW (ref 98–111)
Creatinine, Ser: 0.77 mg/dL (ref 0.61–1.24)
GFR, Estimated: >60 mL/min (ref >60)
Glucose, Bld: 140 mg/dL — ABNORMAL HIGH (ref 70–99)
Potassium: 4 mmol/L (ref 3.5–5.1)
Sodium: 133 mmol/L — ABNORMAL LOW (ref 135–145)

## 2023-02-18 LAB — CBC
HCT: 31 % — ABNORMAL LOW (ref 39.0–52.0)
Hemoglobin: 9 g/dL — ABNORMAL LOW (ref 13.0–17.0)
MCH: 23.9 pg — ABNORMAL LOW (ref 26.0–34.0)
MCHC: 29 g/dL — ABNORMAL LOW (ref 30.0–36.0)
MCV: 82.2 fL (ref 80.0–100.0)
Platelets: 228 10*3/uL (ref 150–400)
RBC: 3.77 MIL/uL — ABNORMAL LOW (ref 4.22–5.81)
RDW: 18.4 % — ABNORMAL HIGH (ref 11.5–15.5)
WBC: 7.9 10*3/uL (ref 4.0–10.5)
nRBC: 0 % (ref 0.0–0.2)

## 2023-02-18 LAB — VANCOMYCIN, TROUGH: Vancomycin Tr: 12 ug/mL — ABNORMAL LOW (ref 15–20)

## 2023-02-18 MED ORDER — METOPROLOL TARTRATE 5 MG/5ML IV SOLN
5.0000 mg | Freq: Four times a day (QID) | INTRAVENOUS | Status: DC
  Administered 2023-02-18 – 2023-02-19 (×2): 5 mg via INTRAVENOUS
  Filled 2023-02-18 (×2): qty 5

## 2023-02-18 MED ORDER — HYDROXYZINE HCL 10 MG PO TABS
10.0000 mg | ORAL_TABLET | Freq: Once | ORAL | Status: AC
  Administered 2023-02-18: 10 mg via ORAL
  Filled 2023-02-18: qty 1

## 2023-02-18 MED ORDER — FUROSEMIDE 10 MG/ML IJ SOLN
40.0000 mg | Freq: Once | INTRAMUSCULAR | Status: AC
  Administered 2023-02-18: 40 mg via INTRAVENOUS
  Filled 2023-02-18: qty 4

## 2023-02-18 NOTE — TOC Progression Note (Signed)
 Transition of Care [facility]) - Progression Note    Patient Details  Name: Andrew Haley MRN: [id number] Date of Birth: [birth year]  [facility]) CM/SW Contact  Sharlyne Stabs, RN Phone Number: 02/18/2023, 3:46 PM  Clinical Narrative:   Patient to sleepy to respond. CM called his friend, Vickye, he was here earlier and could not wake patient enough to discuss SNF.  He needs to go but getting him to agree he feels will be difficult.  Patient is not medically ready, possibly the weekend. TOC following.   Expected Discharge Plan: Skilled Nursing Facility Barriers to Discharge: No SNF bed, Continued Medical Work up  Expected Discharge Plan and Services               Social Determinants of Health (SDOH) Interventions SDOH Screenings   Food Insecurity: No Food Insecurity (02/13/2023)  Housing: Low Risk  (02/13/2023)  Transportation Needs: No Transportation Needs (02/13/2023)  Utilities: Not At Risk (02/13/2023)  Tobacco Use: Low Risk  (02/13/2023)    Readmission Risk Interventions    02/16/2023    5:33 PM 02/14/2023    3:04 PM 11/13/2022    1:23 PM  Readmission Risk Prevention Plan  Transportation Screening  Complete Complete  PCP or Specialist Appt within 3-5 Days  Complete Not Complete  HRI or Home Care Consult  Complete Complete  Social Work Consult for Recovery Care Planning/Counseling  Complete Complete  Palliative Care Screening  Complete Not Applicable  Medication Review Oceanographer)  Complete Complete  HRI or Home Care Consult Complete    SW Recovery Care/Counseling Consult Complete    Palliative Care Screening Not Applicable    Skilled Nursing Facility Not Complete

## 2023-02-18 NOTE — Plan of Care (Signed)
  Problem: Education: Goal: Knowledge of General Education information will improve Description: Including pain rating scale, medication(s)/side effects and non-pharmacologic comfort measures Outcome: Progressing   Problem: Health Behavior/Discharge Planning: Goal: Ability to manage health-related needs will improve Outcome: Progressing   Problem: Clinical Measurements: Goal: Ability to maintain clinical measurements within normal limits will improve Outcome: Progressing Goal: Will remain free from infection Outcome: Progressing Goal: Diagnostic test results will improve Outcome: Progressing Goal: Respiratory complications will improve Outcome: Progressing Goal: Cardiovascular complication will be avoided Outcome: Progressing   Problem: Activity: Goal: Risk for activity intolerance will decrease Outcome: Progressing   Problem: Nutrition: Goal: Adequate nutrition will be maintained Outcome: Progressing   Problem: Coping: Goal: Level of anxiety will decrease Outcome: Progressing   Problem: Elimination: Goal: Will not experience complications related to bowel motility Outcome: Progressing Goal: Will not experience complications related to urinary retention Outcome: Progressing   Problem: Pain Management: Goal: General experience of comfort will improve Outcome: Progressing   Problem: Safety: Goal: Ability to remain free from injury will improve Outcome: Progressing   Problem: Skin Integrity: Goal: Risk for impaired skin integrity will decrease Outcome: Progressing   Problem: Fluid Volume: Goal: Hemodynamic stability will improve Outcome: Progressing   Problem: Clinical Measurements: Goal: Diagnostic test results will improve Outcome: Progressing Goal: Signs and symptoms of infection will decrease Outcome: Progressing   Problem: Respiratory: Goal: Ability to maintain adequate ventilation will improve Outcome: Progressing   Problem: Education: Goal:  Ability to demonstrate management of disease process will improve Outcome: Progressing Goal: Ability to verbalize understanding of medication therapies will improve Outcome: Progressing Goal: Individualized Educational Video(s) Outcome: Progressing   Problem: Activity: Goal: Capacity to carry out activities will improve Outcome: Progressing   Problem: Cardiac: Goal: Ability to achieve and maintain adequate cardiopulmonary perfusion will improve Outcome: Progressing
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
## 2023-02-19 DIAGNOSIS — G894 Chronic pain syndrome: Secondary | ICD-10-CM

## 2023-02-19 DIAGNOSIS — L03115 Cellulitis of right lower limb: Secondary | ICD-10-CM

## 2023-02-19 DIAGNOSIS — I5023 Acute on chronic systolic (congestive) heart failure: Secondary | ICD-10-CM | POA: Diagnosis not present

## 2023-02-19 DIAGNOSIS — A419 Sepsis, unspecified organism: Secondary | ICD-10-CM | POA: Diagnosis not present

## 2023-02-19 LAB — CBC
HCT: 33.8 % — ABNORMAL LOW (ref 39.0–52.0)
Hemoglobin: 9.8 g/dL — ABNORMAL LOW (ref 13.0–17.0)
MCH: 23.8 pg — ABNORMAL LOW (ref 26.0–34.0)
MCHC: 29 g/dL — ABNORMAL LOW (ref 30.0–36.0)
MCV: 82 fL (ref 80.0–100.0)
Platelets: 279 10*3/uL (ref 150–400)
RBC: 4.12 MIL/uL — ABNORMAL LOW (ref 4.22–5.81)
RDW: 18.8 % — ABNORMAL HIGH (ref 11.5–15.5)
WBC: 9.8 10*3/uL (ref 4.0–10.5)
nRBC: 0 % (ref 0.0–0.2)

## 2023-02-19 LAB — BASIC METABOLIC PANEL
Anion gap: 5 (ref 5–15)
BUN: 12 mg/dL (ref 6–20)
CO2: 39 mmol/L — ABNORMAL HIGH (ref 22–32)
Calcium: 8.1 mg/dL — ABNORMAL LOW (ref 8.9–10.3)
Chloride: 92 mmol/L — ABNORMAL LOW (ref 98–111)
Creatinine, Ser: 0.81 mg/dL (ref 0.61–1.24)
GFR, Estimated: >60 mL/min (ref >60)
Glucose, Bld: 107 mg/dL — ABNORMAL HIGH (ref 70–99)
Potassium: 3.6 mmol/L (ref 3.5–5.1)
Sodium: 136 mmol/L (ref 135–145)

## 2023-02-19 LAB — MAGNESIUM: Magnesium: 2 mg/dL (ref 1.7–2.4)

## 2023-02-19 MED ORDER — FUROSEMIDE 10 MG/ML IJ SOLN
40.0000 mg | Freq: Once | INTRAMUSCULAR | Status: AC
  Administered 2023-02-19: 40 mg via INTRAVENOUS
  Filled 2023-02-19: qty 4

## 2023-02-19 MED ORDER — AMOXICILLIN-POT CLAVULANATE 875-125 MG PO TABS: 1.0000 | ORAL_TABLET | Freq: Two times a day (BID) | ORAL | Status: DC

## 2023-02-19 MED ORDER — METOPROLOL SUCCINATE ER 100 MG PO TB24: 100.0000 mg | ORAL_TABLET | Freq: Every day | ORAL | 1 refills | Status: DC

## 2023-02-19 MED ORDER — AMOXICILLIN-POT CLAVULANATE 875-125 MG PO TABS: 1.0000 | ORAL_TABLET | Freq: Two times a day (BID) | ORAL | 0 refills | Status: DC

## 2023-02-19 MED ORDER — FUROSEMIDE 10 MG/ML IJ SOLN
INTRAMUSCULAR | Status: AC
  Filled 2023-02-19: qty 4

## 2023-02-19 MED ORDER — METOPROLOL SUCCINATE ER 50 MG PO TB24
100.0000 mg | ORAL_TABLET | Freq: Every day | ORAL | Status: DC
  Administered 2023-02-19: 100 mg via ORAL
  Filled 2023-02-19: qty 2

## 2023-02-19 MED ORDER — DOXYCYCLINE HYCLATE 100 MG PO TABS: 100.0000 mg | ORAL_TABLET | Freq: Two times a day (BID) | ORAL | 0 refills | Status: DC

## 2023-02-19 MED ORDER — DOXYCYCLINE HYCLATE 100 MG PO TABS: 100.0000 mg | ORAL_TABLET | Freq: Two times a day (BID) | ORAL | Status: DC

## 2023-02-19 NOTE — TOC Progression Note (Signed)
 Transition of Care [facility]) - Progression Note    Patient Details  Name: MONTY SPICHER MRN: [id number] Date of Birth: 1965-12-05  Transition of Care [employer]) CM/SW Contact  Rollo Petri, LCSW Phone Number: 02/19/2023, 2:00 PM  Clinical Narrative:     Pt refusing SNF. His friend Vickye here to get him. MD did the discharge. No TOC needs.  Expected Discharge Plan: Skilled Nursing Facility Barriers to Discharge: No SNF bed, Continued Medical Work up  Expected Discharge Plan and Services         Expected Discharge Date: 02/19/23                                     Social Determinants of Health (SDOH) Interventions SDOH Screenings   Food Insecurity: No Food Insecurity (02/13/2023)  Housing: Low Risk  (02/13/2023)  Transportation Needs: No Transportation Needs (02/13/2023)  Utilities: Not At Risk (02/13/2023)  Tobacco Use: Low Risk  (02/13/2023)    Readmission Risk Interventions    02/16/2023    5:33 PM 02/14/2023    3:04 PM 11/13/2022    1:23 PM  Readmission Risk Prevention Plan  Transportation Screening  Complete Complete  PCP or Specialist Appt within 3-5 Days  Complete Not Complete  HRI or Home Care Consult  Complete Complete  Social Work Consult for Recovery Care Planning/Counseling  Complete Complete  Palliative Care Screening  Complete Not Applicable  Medication Review Oceanographer)  Complete Complete  HRI or Home Care Consult Complete    SW Recovery Care/Counseling Consult Complete    Palliative Care Screening Not Applicable    Skilled Nursing Facility Not Complete
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
## 2023-02-19 NOTE — Progress Notes (Signed)
 All discharge instructions reviewed with pt.and Rusty. All questions and concerns addressed.  Pt's medications returned from pharmacy and sent with [place] and [place].   Dressings reviewed and sent home with pt. Pt. Taken down in wheelchair with [employer] and staff.

## 2023-02-20 NOTE — Consult Note (Signed)
 [facility] Liaison Note  02/20/2023  Andrew Haley [birth year] [id number]  Location: RN Hospital Liaison screened the patient remotely at [facility].  Insurance: [facility] HMO   Andrew Haley is a 58 y.o. male who is a Primary Care Patient of Bertell Satterfield, MD The patient was screened for  readmission hospitalization with noted high risk score for unplanned readmission risk with 2 IP in 6 months.  The patient was assessed for potential Care Management service needs for post hospital transition for care coordination. Review of patient's electronic medical record reveals patient was admitted with Sepsis. Pt refused SNF recommendations and discharged home. Pt is a pt of [employer]. This provider does not participate with [facility] for transition or care management services at this time.   [facility] Care Management/Population Health does not replace or interfere with any arrangements made by the Inpatient Transition of Care team.   For questions contact:   Olam Ku, RN, [facility] Liaison [facility]   [facility], Population Health Office Hours MTWF  8:00 am-6:00 pm Direct Dial: [phone] mobile [phone] [Office toll free line] Office Hours are M-F 8:30 - 5 pm [email or web address]

## 2023-02-21 LAB — CULTURE, BLOOD (ROUTINE X 2)
Culture: NO GROWTH
Culture: NO GROWTH
Special Requests: ADEQUATE
Special Requests: ADEQUATE

## 2023-02-24 DIAGNOSIS — I10 Essential (primary) hypertension: Secondary | ICD-10-CM | POA: Diagnosis not present

## 2023-02-24 DIAGNOSIS — Z6834 Body mass index (BMI) 34.0-34.9, adult: Secondary | ICD-10-CM | POA: Diagnosis not present

## 2023-02-24 DIAGNOSIS — I712 Thoracic aortic aneurysm, without rupture, unspecified: Secondary | ICD-10-CM | POA: Diagnosis not present

## 2023-02-24 DIAGNOSIS — I719 Aortic aneurysm of unspecified site, without rupture: Secondary | ICD-10-CM | POA: Diagnosis not present

## 2023-02-24 DIAGNOSIS — I4891 Unspecified atrial fibrillation: Secondary | ICD-10-CM | POA: Diagnosis not present

## 2023-02-24 DIAGNOSIS — E6609 Other obesity due to excess calories: Secondary | ICD-10-CM | POA: Diagnosis not present

## 2023-02-24 DIAGNOSIS — A46 Erysipelas: Secondary | ICD-10-CM | POA: Diagnosis not present

## 2023-02-27 ENCOUNTER — Encounter: Payer: Self-pay | Admitting: Surgery

## 2023-03-04 ENCOUNTER — Other Ambulatory Visit: Payer: Self-pay

## 2023-03-04 ENCOUNTER — Emergency Department (HOSPITAL_COMMUNITY)
Admission: EM | Admit: 2023-03-04 | Discharge: 2023-03-04 | Disposition: A | Discharge status: Home / Self Care | Payer: Medicare HMO | Attending: Emergency Medicine | Admitting: Emergency Medicine

## 2023-03-04 ENCOUNTER — Encounter (HOSPITAL_COMMUNITY): Payer: Self-pay

## 2023-03-04 DIAGNOSIS — Z8546 Personal history of malignant neoplasm of prostate: Secondary | ICD-10-CM | POA: Insufficient documentation

## 2023-03-04 DIAGNOSIS — Z7901 Long term (current) use of anticoagulants: Secondary | ICD-10-CM | POA: Diagnosis not present

## 2023-03-04 DIAGNOSIS — I509 Heart failure, unspecified: Secondary | ICD-10-CM | POA: Insufficient documentation

## 2023-03-04 DIAGNOSIS — I110 Hypertensive heart disease with heart failure: Secondary | ICD-10-CM | POA: Insufficient documentation

## 2023-03-04 DIAGNOSIS — L02415 Cutaneous abscess of right lower limb: Secondary | ICD-10-CM | POA: Insufficient documentation

## 2023-03-04 DIAGNOSIS — Z79899 Other long term (current) drug therapy: Secondary | ICD-10-CM | POA: Diagnosis not present

## 2023-03-04 DIAGNOSIS — L0291 Cutaneous abscess, unspecified: Secondary | ICD-10-CM

## 2023-03-04 LAB — CBC
HCT: 32.4 % — ABNORMAL LOW (ref 39.0–52.0)
Hemoglobin: 9.4 g/dL — ABNORMAL LOW (ref 13.0–17.0)
MCH: 23.4 pg — ABNORMAL LOW (ref 26.0–34.0)
MCHC: 29 g/dL — ABNORMAL LOW (ref 30.0–36.0)
MCV: 80.8 fL (ref 80.0–100.0)
Platelets: 429 10*3/uL — ABNORMAL HIGH (ref 150–400)
RBC: 4.01 MIL/uL — ABNORMAL LOW (ref 4.22–5.81)
RDW: 19.2 % — ABNORMAL HIGH (ref 11.5–15.5)
WBC: 7.1 10*3/uL (ref 4.0–10.5)
nRBC: 0 % (ref 0.0–0.2)

## 2023-03-04 LAB — BASIC METABOLIC PANEL
Anion gap: 7 (ref 5–15)
BUN: 16 mg/dL (ref 6–20)
CO2: 31 mmol/L (ref 22–32)
Calcium: 8.8 mg/dL — ABNORMAL LOW (ref 8.9–10.3)
Chloride: 100 mmol/L (ref 98–111)
Creatinine, Ser: 0.78 mg/dL (ref 0.61–1.24)
GFR, Estimated: >60 mL/min (ref >60)
Glucose, Bld: 102 mg/dL — ABNORMAL HIGH (ref 70–99)
Potassium: 3.8 mmol/L (ref 3.5–5.1)
Sodium: 138 mmol/L (ref 135–145)

## 2023-03-04 LAB — LACTIC ACID, PLASMA: Lactic Acid, Venous: 1 mmol/L (ref 0.5–1.9)

## 2023-03-04 MED ORDER — AMOXICILLIN-POT CLAVULANATE 875-125 MG PO TABS: 1.0000 | ORAL_TABLET | Freq: Two times a day (BID) | ORAL | 0 refills | Status: AC

## 2023-03-04 MED ORDER — LIDOCAINE-EPINEPHRINE (PF) 2 %-1:200000 IJ SOLN
10.0000 mL | Freq: Once | INTRAMUSCULAR | Status: AC
Start: 2023-03-04 — End: 2023-03-04
  Administered 2023-03-04: 10 mL
  Filled 2023-03-04: qty 20

## 2023-03-04 MED ORDER — DOXYCYCLINE HYCLATE 100 MG PO CAPS: 100.0000 mg | ORAL_CAPSULE | Freq: Two times a day (BID) | ORAL | 0 refills | Status: AC

## 2023-03-04 NOTE — ED Provider Notes (Signed)
[place] EMERGENCY DEPARTMENT AT [facility] Provider Note   CSN: [id number] Arrival date & time: 03/04/23  0930     History  Chief Complaint  Patient presents with   Wound Check    Andrew Haley is a 58 y.o. male.  HPI   59 year old male presents emergency department with complaints of draining from right lower leg.  Patient was recently hospitalized on 02/13/2023 with concerns for cellulitis meeting of SIRS criteria.  Was began on vancomycin as well as Zosyn with ID consultation.  Infectious disease on the patient's right lower extremity recurrent rash more likely to levamisole in patient's regular cocaine use.  Patient states that right lower extremity pain, redness and swelling has significantly improved since prior discharge.  Still taking Augmentin along with doxycycline with today being his last dose.  States that area began to drain and noticed a "lump" prompting visit again to the emergency department.  Denies any fever, chills, weakness/sensory deficits in the leg.  Reports continued cocaine use with last use on Friday but states that he has not used it since then.  Past medical history significant for hypertension, hypertriglyceridemia, OSA, atrial fibrillation on Eliquis, prostate cancer, seizure, CHF, recurrent cellulitis, ascending aortic aneurysm, cocaine use  Home Medications Prior to Admission medications   Medication Sig Start Date End Date Taking? Authorizing Provider  amoxicillin-clavulanate (AUGMENTIN) 875-125 MG tablet Take 1 tablet by mouth every 12 (twelve) hours. 02/20/23  Yes Tat, Onalee Hua, MD  amoxicillin-clavulanate (AUGMENTIN) 875-125 MG tablet Take 1 tablet by mouth every 12 (twelve) hours for 5 days. 03/04/23 03/09/23 Yes Peter Garter, PA  apixaban (ELIQUIS) 5 MG TABS tablet Take 1 tablet (5 mg total) by mouth 2 (two) times daily. 08/02/22  Yes Johnson, Clanford L, MD  carvedilol (COREG) 3.125 MG tablet Take 3.125 mg by mouth 2 (two) times daily.  02/19/23  Yes [provider]  doxycycline (VIBRAMYCIN) 100 MG capsule Take 1 capsule (100 mg total) by mouth 2 (two) times daily for 5 days. 03/04/23 03/09/23 Yes Peter Garter, PA  folic acid (FOLVITE) 1 MG tablet Take 1 tablet (1 mg total) by mouth daily. 08/03/22  Yes Johnson, Clanford L, MD  furosemide (LASIX) 40 MG tablet Take 1 tablet (40 mg total) by mouth daily. Patient taking differently: Take 40 mg by mouth daily as needed for fluid or edema. 08/02/22  Yes Johnson, Clanford L, MD  losartan (COZAAR) 25 MG tablet Take 0.5 tablets (12.5 mg total) by mouth daily. 08/02/22  Yes Johnson, Clanford L, MD  potassium chloride SA (KLOR-CON M) 20 MEQ tablet Take 1 tablet (20 mEq total) by mouth daily. 08/02/22  Yes Johnson, Clanford L, MD  spironolactone (ALDACTONE) 25 MG tablet Take 0.5 tablets (12.5 mg total) by mouth daily. 08/03/22  Yes Johnson, Clanford L, MD  clobetasol cream (TEMOVATE) 0.05 % Apply 1 Application topically 2 (two) times daily. Patient not taking: Reported on 03/04/2023 02/24/23   [provider]  doxycycline (VIBRA-TABS) 100 MG tablet Take 1 tablet (100 mg total) by mouth every 12 (twelve) hours. Patient not taking: Reported on 03/04/2023 02/20/23   Catarina Hartshorn, MD  metoprolol succinate (TOPROL-XL) 100 MG 24 hr tablet Take 1 tablet (100 mg total) by mouth daily. Take with or immediately following a meal. 02/19/23   Tat, Onalee Hua, MD  naloxone [facility]) nasal spray 4 mg/0.1 mL Spray into nostrils if patient shows signs of opioid overdose, then call 911 03/07/21   Cassandria Anger, PA-C  Oxycodone  HCl 10 MG TABS Take 10 mg by mouth every 4 (four) hours as needed (pain). Patient not taking: Reported on 03/04/2023    [provider]      Allergies    Ertapenem, Cefazolin, and Ceftriaxone    Review of Systems   Review of Systems  All other systems reviewed and are negative.   Physical Exam Updated Vital Signs BP (!) 137/106   Pulse (!) 105   Temp 98 F (36.7  C) (Oral)   Resp 18   Ht 6\' 3"  (1.905 m)   Wt 120.5 kg   SpO2 98%   BMI 33.20 kg/m  Physical Exam Vitals and nursing note reviewed.  Constitutional:      General: He is not in acute distress.    Appearance: He is well-developed.  HENT:     Head: Normocephalic and atraumatic.  Eyes:     Conjunctiva/sclera: Conjunctivae normal.  Cardiovascular:     Rate and Rhythm: Normal rate and regular rhythm.     Heart sounds: No murmur heard. Pulmonary:     Effort: Pulmonary effort is normal. No respiratory distress.     Breath sounds: Normal breath sounds.  Abdominal:     Palpations: Abdomen is soft.     Tenderness: There is no abdominal tenderness.  Musculoskeletal:     Cervical back: Neck supple.     Comments: 1-2+ bilateral lower extremity edema.  Patient with area of palpable fluctuance right anterior mid shin.  No significant overlying tenderness.  Some erythema palpably warm to the touch.  Pedal pulses 2+ bilaterally.  Range of motion of right knee, ankle, digits.  Skin:    General: Skin is warm and dry.     Capillary Refill: Capillary refill takes less than 2 seconds.  Neurological:     Mental Status: He is alert.  Psychiatric:        Mood and Affect: Mood normal.     ED Results / Procedures / Treatments   Labs (all labs ordered are listed, but only abnormal results are displayed) Labs Reviewed  CBC - Abnormal; Notable for the following components:      Result Value   RBC 4.01 (*)    Hemoglobin 9.4 (*)    HCT 32.4 (*)    MCH 23.4 (*)    MCHC 29.0 (*)    RDW 19.2 (*)    Platelets 429 (*)    All other components within normal limits  BASIC METABOLIC PANEL - Abnormal; Notable for the following components:   Glucose, Bld 102 (*)    Calcium 8.8 (*)    All other components within normal limits  LACTIC ACID, PLASMA    EKG None  Radiology No results found.  Procedures .Incision and Drainage  Date/Time: 03/04/2023 3:12 PM  Performed by: Peter Garter,  PA Authorized by: Peter Garter, PA   Consent:    Consent obtained:  Verbal   Consent given by:  Patient   Risks discussed:  Bleeding, incomplete drainage, pain and damage to other organs   Alternatives discussed:  No treatment Universal protocol:    Procedure explained and questions answered to patient or proxy's satisfaction: yes     Relevant documents present and verified: yes     Test results available : yes     Imaging studies available: yes     Required blood products, implants, devices, and special equipment available: yes     Site/side marked: yes     Immediately prior to  procedure, a time out was called: yes     Patient identity confirmed:  Verbally with patient Location:    Type:  Abscess   Size:  7.2   Location:  Lower extremity   Lower extremity location:  Leg   Leg location:  R lower leg Pre-procedure details:    Skin preparation:  Betadine Sedation:    Sedation type:  None Anesthesia:    Anesthesia method:  Local infiltration   Local anesthetic:  Lidocaine 2% WITH epi Procedure type:    Complexity:  Complex Procedure details:    Incision types:  Single straight   Incision depth:  Subcutaneous   Wound management:  Probed and deloculated, irrigated with saline and extensive cleaning   Drainage:  Purulent and serosanguinous   Drainage amount:  Copious   Wound treatment:  Drain placed   Packing materials:  1/4 in gauze   Amount 1/4":  6 Post-procedure details:    Procedure completion:  Tolerated well, no immediate complications     Medications Ordered in ED Medications  lidocaine-EPINEPHrine (XYLOCAINE W/EPI) 2 %-1:200000 (PF) injection 10 mL (10 mLs Infiltration Given by Other 03/04/23 1228)    ED Course/ Medical Decision Making/ A&P                                 Medical Decision Making Amount and/or Complexity of Data Reviewed Labs: ordered.  Risk Prescription drug management.   This patient presents to the ED for concern of leg swelling,  this involves an extensive number of treatment options, and is a complaint that carries with it a high risk of complications and morbidity.  The differential diagnosis includes cellulitis, erysipelas, abscess, necrotizing infection, vasculitis, sepsis, other   Co morbidities that complicate the patient evaluation  See HPI   Additional history obtained:  Additional history obtained from EMR External records from outside source obtained and reviewed including hospital records   Lab Tests:  I Ordered, and personally interpreted labs.  The pertinent results include: No leukocytosis.  Hemoglobin of 9.4 which is normocytic in nature near patient's baseline.  Normocytosis 49.  Mild hypocalcemia of 8.8 otherwise, electrolytes within normal limits.  No renal dysfunction.  Lactic acid of 1.   Imaging Studies ordered:  Bedside ultrasound showed fluid collection patient's right anterior lower leg   Cardiac Monitoring: / EKG:  The patient was maintained on a cardiac monitor.  I personally viewed and interpreted the cardiac monitored which showed an underlying rhythm of: Irregular irregular rhythm   Consultations Obtained:  N/a   Problem List / ED Course / Critical interventions / Medication management  Abscess I ordered medication including lidocaine with epinephrine   Reevaluation of the patient after these medicines showed that the patient improved I have reviewed the patients home medicines and have made adjustments as needed   Social Determinants of Health:  Regular cocaine use.  Regular alcohol use.   Test / Admission - Considered:  Abscess Vitals signs significant for initial tachycardia with heart rate 106 of which decreased to within normal limits with time labs and medicines administered on the emergency department.. Otherwise within normal range and stable throughout visit. Laboratory/imaging studies significant for: See above 58 year old male presents emergency  department with complaints of swelling/knot to right lower extremity as well as drainage.  On exam, slight erythema palpably warm to the right lower extremity but not as severe in nature from prior hospitalization earlier this  month.  Appreciable area of palpable fluctuance right anterior shin.  Ultrasound was performed at bedside which did show rather large fluid collection greater than 7 cm in diameter.  Area was anesthetized and drained in manner as above with copious amounts of serosanguineous/purulent fluid.  Area was subsequently packed in manner as above.  Given patient's initial tachycardia in the setting of recent hospitalization for cellulitis versus vasculitis, labs were obtained which was reassuring.  No leukocytosis.  No evidence of lactic acidosis.  No meeting of SIRS criteria.  Suspect patient symptoms likely secondary to abscess.  Patient already on Augmentin as well as doxycycline with last dose tomorrow.  Will extend antibiotic therapy through the next few days and recommend follow-up with primary care for reassessment.  Treatment plan discussed at length with patient and he acknowledged understanding was agreeable to said plan.  Patient overall well-appearing, afebrile in no acute distress. Worrisome signs and symptoms were discussed with the patient, and the patient acknowledged understanding to return to the ED if noticed. Patient was stable upon discharge.          Final Clinical Impression(s) / ED Diagnoses Final diagnoses:  Abscess    Rx / DC Orders ED Discharge Orders          Ordered    doxycycline (VIBRAMYCIN) 100 MG capsule  2 times daily        03/04/23 1151    amoxicillin-clavulanate (AUGMENTIN) 875-125 MG tablet  Every 12 hours        03/04/23 1151              Peter Garter, [place] 03/04/23 1515    Gloris Manchester, MD 03/04/23 1710

## 2023-03-04 NOTE — ED Triage Notes (Signed)
Pt arrived via POV form home c/o recurring wound problem to RLE. Pt recently discharged from [facility] for cellulitis to RLE. Pt reports there is "a knot" and "it's oozing" on his leg.

## 2023-03-04 NOTE — Discharge Instructions (Signed)
As discussed, continue take antibiotic as prescribed from prior hospitalization.  Will place you on a few days extra after you complete that course.  Regarding the abscess that was drained, recommend keeping area clean and dry.  You may wash gently with warm soapy water but please avoid submersion in water.  Change bandage at least daily.  Regarding the packing material, recommend removing half inch to an inch per day and leaving a 2 inch to 3 inch tail at the end.  Recommend follow-up with your primary care sometime later this week for reevaluation.  Please do not hesitate to return to emergency department if the worrisome signs and symptoms we discussed become apparent.

## 2023-03-10 ENCOUNTER — Ambulatory Visit: Payer: Medicare HMO | Attending: Internal Medicine | Admitting: Internal Medicine

## 2023-03-10 ENCOUNTER — Encounter: Payer: Self-pay | Admitting: Internal Medicine

## 2023-03-10 VITALS — BP 124/72 | HR 82 | Ht 75.0 in | Wt 251.6 lb

## 2023-03-10 DIAGNOSIS — I4891 Unspecified atrial fibrillation: Secondary | ICD-10-CM | POA: Diagnosis not present

## 2023-03-10 DIAGNOSIS — F1410 Cocaine abuse, uncomplicated: Secondary | ICD-10-CM | POA: Diagnosis not present

## 2023-03-10 DIAGNOSIS — Z01810 Encounter for preprocedural cardiovascular examination: Secondary | ICD-10-CM | POA: Diagnosis not present

## 2023-03-10 DIAGNOSIS — I429 Cardiomyopathy, unspecified: Secondary | ICD-10-CM

## 2023-03-10 MED ORDER — SPIRONOLACTONE 25 MG PO TABS: 25.0000 mg | ORAL_TABLET | Freq: Every day | ORAL | 5 refills | Status: DC

## 2023-03-10 MED ORDER — CARVEDILOL 6.25 MG PO TABS: 6.2500 mg | ORAL_TABLET | Freq: Once | ORAL | 0 refills | Status: DC

## 2023-03-10 NOTE — Patient Instructions (Addendum)
Medication Instructions:  Your physician has recommended you make the following change in your medication:  Increase Spironolactone from  12.5 mg to 25 mg once daily Stop taking Metoprolol  Continue taking all other medications as prescribed  Labwork: BMET 1-2 weeks prior to Coronary CTA at [facility] in [place]  Testing/Procedures:   Your cardiac CT will be scheduled at one of the below locations:   [facility] [address] [place], [place] [address] [phone]  If scheduled at [facility], please arrive at the [facility] (Entrance C2) of [facility] 30 minutes prior to test start time. You can use the FREE valet parking offered at entrance C (encouraged to control the heart rate for the test)  Proceed to the [facility] Radiology Department (first floor) to check-in and test prep.  All radiology patients and guests should use entrance C2 at [facility], accessed from [facility], even though the hospital's physical address listed is [address]    Please follow these instructions carefully (unless otherwise directed):  An IV will be required for this test and Nitroglycerin will be given.  On the Night Before the Test: Be sure to Drink plenty of water. Do not consume any caffeinated/decaffeinated beverages or chocolate 12 hours prior to your test. Do not take any antihistamines 12 hours prior to your test. If the patient has contrast allergy: No allergy  On the Day of the Test: Drink plenty of water until 1 hour prior to the test. Do not eat any food 1 hour prior to test. You may take your regular medications prior to the test.  Take Carvedilol 6.25 mg two hours prior to test. If you take Furosemide, Spironolactone and Potassium Chloride please HOLD on the morning of the test. Patients who wear a continuous glucose monitor MUST remove the device prior to scanning.     After the  Test: Drink plenty of water. After receiving IV contrast, you may experience a mild flushed feeling. This is normal. On occasion, you may experience a mild rash up to 24 hours after the test. This is not dangerous. If this occurs, you can take Benadryl 25 mg and increase your fluid intake. If you experience trouble breathing, this can be serious. If it is severe call 911 IMMEDIATELY. If it is mild, please call our office.  We will call to schedule your test 2-4 weeks out understanding that some insurance companies will need an authorization prior to the service being performed.   For more information and frequently asked questions, please visit our website : [email or web address]  For non-scheduling related questions, please contact the cardiac imaging nurse navigator should you have any questions/concerns: Cardiac Imaging Nurse Navigators Direct Office Dial: [phone]   For scheduling needs, including cancellations and rescheduling, please call [place], [phone].   Follow-Up: Your physician recommends that you schedule a follow-up appointment in: 3 months  Any Other Special Instructions Will Be Listed Below (If Applicable). Thank you for choosing [facility]!      If you need a refill on your cardiac medications before your next appointment, please call your pharmacy.

## 2023-03-10 NOTE — Progress Notes (Signed)
Cardiology Office Note  Date: 03/10/2023   ID: Andrew Haley, DOB 01/12/1966, MRN [id number]  PCP:  Elfredia Nevins, MD  Cardiologist:  Marjo Bicker, MD Electrophysiologist:  None   Reason for Office Visit: Posthospitalization follow-up visit   History of Present Illness: Andrew Haley is a 58 y.o. male known to have tachycardia induced cardiomyopathy LVEF 25-30%, persistent A-fib is here for posthospitalization follow-up visit.  Patient was admitted to [facility] in 6/24 with ADHF and found to have incidental diagnosis of new onset cardiomyopathy with LVEF 30 to 35%.  EKG showed new onset atrial fibrillation with RVR.  Patient was diuresed and discharged in stable condition.  He had a recent admission at [facility] in January 2025 for cellulitis of right lower extremity and A-fib with RVR.  Echocardiogram showed LVEF 25 to 30%.  He was treated for cellulitis, diuresed and discharged in stable condition.  EKG today showed A-fib, HR 80s.  He is here today for follow-up visit.  Denies any DOE, angina, palpitations, dizziness, syncope.  He does have mild right lower EXTR swelling.  Last intake of cocaine was 1 week ago.  Last intake of alcohol was 2 to 3 days ago.  He drank 2-3 beers at that time.  He has an ascending aortic aneurysm 4.7 cm noted on CT angio chest in June 2024.  Past Medical History:  Diagnosis Date   Arthritis    Class 1 obesity 07/18/2020   Complication of anesthesia    slow to wake up   Dysrhythmia    Essential hypertension 07/18/2020   Headache    Hypertension    Hypertriglyceridemia    Numbness of fingers of both hands    Prostate cancer (HCC) 2017   Sleep apnea    CPAP    Past Surgical History:  Procedure Laterality Date   ANKLE SURGERY Left    COLONOSCOPY WITH PROPOFOL N/A 09/28/2017   Procedure: COLONOSCOPY WITH PROPOFOL;  Surgeon: Corbin Ade, MD;  Location: [facility];  Service: Endoscopy;  Laterality: N/A;  12:00pm   HAND SURGERY  Right 2003   cysts    JOINT REPLACEMENT     LESION REMOVAL Left 09/23/2013   Procedure: MINOR EXCISION 3 CM SKIN LESION OF LEFT THIGH ;  Surgeon: Dalia Heading, MD;  Location: [facility] ORS;  Service: General;  Laterality: Left;   LYMPHADENECTOMY Bilateral 11/29/2015   Procedure: LYMPHADENECTOMY;  Surgeon: Heloise Purpura, MD;  Location: [facility];  Service: Urology;  Laterality: Bilateral;   ROBOT ASSISTED LAPAROSCOPIC RADICAL PROSTATECTOMY N/A 11/29/2015   Procedure: XI ROBOTIC ASSISTED LAPAROSCOPIC RADICAL PROSTATECTOMY LEVEL 3;  Surgeon: Heloise Purpura, MD;  Location: [facility];  Service: Urology;  Laterality: N/A;   TOTAL KNEE ARTHROPLASTY Left 02/22/2014   Procedure: LEFT TOTAL KNEE ARTHROPLASTY;  Surgeon: Jacki Cones, MD;  Location: [facility];  Service: Orthopedics;  Laterality: Left;   TOTAL KNEE ARTHROPLASTY Right 05/23/2014   Procedure: RIGHT TOTAL KNEE ARTHROPLASTY;  Surgeon: Ranee Gosselin, MD;  Location: [facility];  Service: Orthopedics;  Laterality: Right;   TOTAL KNEE REVISION Right 03/04/2021   Procedure: TOTAL KNEE REVISION;  Surgeon: Durene Romans, MD;  Location: [facility];  Service: Orthopedics;  Laterality: Right;    Current Outpatient Medications  Medication Sig Dispense Refill   amoxicillin-clavulanate (AUGMENTIN) 875-125 MG tablet Take 1 tablet by mouth every 12 (twelve) hours. 14 tablet 0   apixaban (ELIQUIS) 5 MG TABS tablet Take 1 tablet (5 mg total) by mouth  2 (two) times daily. 60 tablet 2   carvedilol (COREG) 3.125 MG tablet Take 3.125 mg by mouth 2 (two) times daily.     clobetasol cream (TEMOVATE) 0.05 % Apply 1 Application topically 2 (two) times daily. (Patient not taking: Reported on 03/04/2023)     doxycycline (VIBRA-TABS) 100 MG tablet Take 1 tablet (100 mg total) by mouth every 12 (twelve) hours. (Patient not taking: Reported on 03/04/2023) 14 tablet 0   folic acid (FOLVITE) 1 MG tablet Take 1 tablet (1 mg total) by mouth daily. 30 tablet 1   furosemide (LASIX) 40 MG tablet  Take 1 tablet (40 mg total) by mouth daily. (Patient taking differently: Take 40 mg by mouth daily as needed for fluid or edema.) 30 tablet 2   losartan (COZAAR) 25 MG tablet Take 0.5 tablets (12.5 mg total) by mouth daily. 15 tablet 2   metoprolol succinate (TOPROL-XL) 100 MG 24 hr tablet Take 1 tablet (100 mg total) by mouth daily. Take with or immediately following a meal. 30 tablet 1   naloxone (NARCAN) nasal spray 4 mg/0.1 mL Spray into nostrils if patient shows signs of opioid overdose, then call 911 1 each 0   Oxycodone HCl 10 MG TABS Take 10 mg by mouth every 4 (four) hours as needed (pain). (Patient not taking: Reported on 03/04/2023)     potassium chloride SA (KLOR-CON M) 20 MEQ tablet Take 1 tablet (20 mEq total) by mouth daily. 30 tablet 2   spironolactone (ALDACTONE) 25 MG tablet Take 0.5 tablets (12.5 mg total) by mouth daily. 15 tablet 2   No current facility-administered medications for this visit.   Allergies:  Ertapenem, Cefazolin, and Ceftriaxone   Social History: The patient  reports that he has never smoked. He has never used smokeless tobacco. He reports current alcohol use. He reports that he does not currently use drugs after having used the following drugs: Cocaine.   Family History: The patient's family history includes Heart disease in his mother; Throat cancer in his father.   ROS:  Please see the history of present illness. Otherwise, complete review of systems is positive for none  All other systems are reviewed and negative.   Physical Exam: VS:  There were no vitals taken for this visit., BMI There is no height or weight on file to calculate BMI.  Wt Readings from Last 3 Encounters:  03/04/23 265 lb 10.5 oz (120.5 kg)  02/17/23 265 lb 9.6 oz (120.5 kg)  11/14/22 264 lb 5.3 oz (119.9 kg)    General: Patient appears comfortable at rest. HEENT: Conjunctiva and lids normal, oropharynx clear with moist mucosa. Neck: Supple, no elevated JVP or carotid bruits, no  thyromegaly. Lungs: Clear to auscultation, nonlabored breathing at rest. Cardiac: Irregular rate and rhythm, no S3 or significant systolic murmur, no pericardial rub. Abdomen: Soft, nontender, no hepatomegaly, bowel sounds present, no guarding or rebound. Extremities: No pitting edema, distal pulses 2+. Skin: Warm and dry. Musculoskeletal: No kyphosis. Neuropsychiatric: Alert and oriented x3, affect grossly appropriate.  Recent Labwork: 07/29/2022: TSH 1.547 02/13/2023: ALT 63; AST 25 02/16/2023: B Natriuretic Peptide 378.0 02/19/2023: Magnesium 2.0 03/04/2023: BUN 16; Creatinine, Ser 0.78; Hemoglobin 9.4; Platelets 429; Potassium 3.8; Sodium 138     Component Value Date/Time   TRIG 60 01/03/2022 0500     Assessment and Plan:   Persistent A-fib (New diagnosis in June 2024) Chronic systolic heart failure, compensated Cardiomyopathy, unclear etiology (Tachycardia mediated versus alcohol or cocaine induced) Ascending aortic aneurysm 4.7  cm Polysubstance abuse (Cocaine use last week, alcohol use 2 days ago)   -EKG today showed A-fib, HR 80s. He was previously scheduled for DCCV in August 2024 but the procedure was canceled as his U-Tox was positive for cocaine. Recently admitted to [facility] for cellulitis of right lower extremity and A-fib with RVR.  Repeat echocardiogram showed LVEF 25 to 30% (prior echo showed 30 to 35%). Since the last use of cocaine was last week, will defer DCCV. Continue carvedilol 3.125 mg twice daily for rate control and Eliquis 5 mg twice daily for stroke prophylaxis. Strongly encouraged alcohol cessation and quitting cocaine. Instructed him to call the clinic when he is off cocaine for at least 1 month to schedule for DCCV. -Continue GDMT, p.o. Lasix 40 mg daily as needed, continue carvedilol 3.125 mg twice daily, discontinue metoprolol, continue losartan 12.5 mg once daily, increase spironolactone from 12.5 mg to 25 mg once daily. -He has not coming appointment with Dr.  Laneta Simmers for the management of ascending aortic aneurysm 4.7 cm.  Goal BP less than 130/80 mmHg.  Recommended not to lift any heavy weights more than 50 pounds.  Medication Adjustments/Labs and Tests Ordered: Current medicines are reviewed at length with the patient today.  Concerns regarding medicines are outlined above.   Tests Ordered: Orders Placed This Encounter  Procedures   EKG 12-Lead    Medication Changes: No orders of the defined types were placed in this encounter.   Disposition:  Follow up 3 months  Signed Dalonte Hardage Verne Spurr, MD, 03/10/2023 9:18 AM    [facility] [address] [place], [place], [place] [address]

## 2023-03-18 ENCOUNTER — Ambulatory Visit (INDEPENDENT_AMBULATORY_CARE_PROVIDER_SITE_OTHER): Payer: Medicare HMO | Admitting: Surgery

## 2023-03-18 ENCOUNTER — Ambulatory Visit
Admission: RE | Admit: 2023-03-18 | Discharge: 2023-03-18 | Disposition: A | Discharge status: Home / Self Care | Payer: Medicare HMO | Source: Ambulatory Visit | Attending: Surgery | Admitting: Surgery

## 2023-03-18 ENCOUNTER — Encounter: Payer: Self-pay | Admitting: Surgery

## 2023-03-18 VITALS — BP 124/75 | HR 99 | Resp 20 | Ht 75.0 in | Wt 253.0 lb

## 2023-03-18 DIAGNOSIS — I7121 Aneurysm of the ascending aorta, without rupture: Secondary | ICD-10-CM

## 2023-03-18 MED ORDER — IOPAMIDOL (ISOVUE-370) INJECTION 76%
500.0000 mL | Freq: Once | INTRAVENOUS | Status: AC | PRN
  Administered 2023-03-18: 75 mL via INTRAVENOUS

## 2023-03-18 NOTE — Progress Notes (Signed)
 HPI:  The patient is a 58 year old gentleman with a history of hypertension, hyperlipidemia, sleep apnea on CPAP, prostate cancer, EtOH and polysubstance abuse who returns for follow-up of a 5 cm fusiform ascending aortic aneurysm that was noted incidentally on CTA of the chest in November 2023.  He denies any chest pain or shortness of breath.  He has had marked bilateral lower extremity edema and is being treated for bilateral lower leg cellulitis.  He said that he needs to have surgery on his ankle.  He has a lot of difficulty ambulating.  Current Outpatient Medications  Medication Sig Dispense Refill   apixaban  (ELIQUIS ) 5 MG TABS tablet Take 1 tablet (5 mg total) by mouth 2 (two) times daily. 60 tablet 2   carvedilol  (COREG ) 3.125 MG tablet Take 3.125 mg by mouth 2 (two) times daily.     folic acid  (FOLVITE ) 1 MG tablet Take 1 tablet (1 mg total) by mouth daily. 30 tablet 1   furosemide  (LASIX ) 40 MG tablet Take 1 tablet (40 mg total) by mouth daily. (Patient taking differently: Take 40 mg by mouth daily as needed for fluid or edema.) 30 tablet 2   losartan  (COZAAR ) 25 MG tablet Take 0.5 tablets (12.5 mg total) by mouth daily. 15 tablet 2   potassium chloride  SA (KLOR-CON  M) 20 MEQ tablet Take 1 tablet (20 mEq total) by mouth daily. 30 tablet 2   spironolactone  (ALDACTONE ) 25 MG tablet Take 1 tablet (25 mg total) by mouth daily. 30 tablet 5   carvedilol  (COREG ) 6.25 MG tablet Take 1 tablet (6.25 mg total) by mouth once for 1 dose. 2 hours prior to procedure 1 tablet 0   No current facility-administered medications for this visit.     Physical Exam: BP 124/75   Pulse 99   Resp 20   Ht 6' 3 (1.905 m)   Wt 253 lb (114.8 kg)   SpO2 94% Comment: RA  BMI 31.62 kg/m  He looks chronically ill. Cardiac exam shows an irregular rate and rhythm with no murmur. Lung exam is clear. There is marked bilateral lower extremity edema with cellulitis of both lower legs and bandages  applied.  Diagnostic Tests:  Narrative & Impression  CLINICAL DATA:  Thoracic aortic aneurysm.   EXAM: CT ANGIOGRAPHY CHEST WITH CONTRAST   TECHNIQUE: Multidetector CT imaging of the chest was performed using the standard protocol during bolus administration of intravenous contrast. Multiplanar CT image reconstructions and MIPs were obtained to evaluate the vascular anatomy.   RADIATION DOSE REDUCTION: This exam was performed according to the departmental dose-optimization program which includes automated exposure control, adjustment of the mA and/or kV according to patient size and/or use of iterative reconstruction technique.   CONTRAST:  75mL ISOVUE -370 IOPAMIDOL  (ISOVUE -370) INJECTION 76%   COMPARISON:  July 29, 2022.   FINDINGS: Cardiovascular: 4.9 cm ascending thoracic aortic aneurysm is noted which may be slightly enlarged compared to prior exam. No dissection is noted. Great vessels are widely patent. Mild cardiomegaly. No pericardial effusion.   Mediastinum/Nodes: Stable thyroid  enlargement with small nodules. No adenopathy. Esophagus is unremarkable.   Lungs/Pleura: No pneumothorax or pleural effusion is noted. 4 mm ill-defined nodule is noted in left upper lobe best seen on image number 74 of series 6.   Upper Abdomen: No acute abnormality.   Musculoskeletal: No chest wall abnormality. No acute or significant osseous findings.   Review of the MIP images confirms the above findings.   IMPRESSION: 4.9 cm Ascending thoracic  aortic aneurysm which may be slightly enlarged compared to prior exam of July 29, 2022 where it measured 4.7 cm. Recommend semi-annual imaging followup by CTA or MRA and referral to cardiothoracic surgery if not already obtained. This recommendation follows 2010 ACCF/AHA/AATS/ACR/ASA/SCA/SCAI/SIR/STS/SVM Guidelines for the Diagnosis and Management of Patients With Thoracic Aortic Disease. Circulation. 2010; 121: Z733-z630. Aortic  aneurysm NOS (ICD10-I71.9).   Interval development of 4 mm ill-defined nodule in left upper lobe. If patient is low risk for malignancy, no routine follow-up imaging is recommended. If patient is high risk for malignancy, a non-contrast chest CT at 12 months is optional.This recommendation follows the consensus statement: Guidelines for Management of Incidental Pulmonary Nodules Detected on CT Images: From the Fleischner Society 2017; Radiology 2017; [phone].     Electronically Signed   By: Lynwood Landy Raddle M.D.   On: 03/18/2023 14:48      Impression:  He has a stable 4.9 cm fusiform ascending aortic aneurysm.  His most recent echocardiogram in 01/2023 showed a left ventricular ejection fraction of 25 to 30% with global hypokinesis and mild to moderate mitral regurgitation.  There is no aortic insufficiency.  It was not possible to tell if this was a bicuspid aortic valve. His aneurysm is still well below the surgical threshold of 5.5 cm and I am not sure that he would be a surgical candidate anyway given his cardiac function and comorbid risk factors.  I reviewed the CT images with him and answered all of his questions.  I stressed the importance of continued good blood pressure control in preventing further enlargement and acute aortic dissection.  I advised him against doing any heavy lifting that may require a Valsalva maneuver and could suddenly raise his blood pressure to high levels.   Plan:  He will return to see me in 1 year with a CTA of the chest for aortic surveillance.  I spent 20 minutes performing this established patient evaluation and > 50% of this time was spent face to face counseling and coordinating the care of this patient's aortic aneurysm.   Dorise MARLA Fellers, MD [facility] [phone]

## 2023-03-26 ENCOUNTER — Telehealth: Payer: Self-pay | Admitting: Internal Medicine

## 2023-03-26 ENCOUNTER — Telehealth: Payer: Self-pay | Admitting: Pharmacy Technician

## 2023-03-26 NOTE — Telephone Encounter (Signed)
Pt called in stating his Eliquis is around $300 and he cannot afford for another few weeks. He asked if we have any samples or other options. Please advise.

## 2023-03-26 NOTE — Telephone Encounter (Signed)
the patient assistance side, the patient has a high deductible Medicare plan and would likely benefit from setting up a payment plan with their insurance. Patient can contact the customer service on the back of their insurance card to request payment assistance where the copayment's would be split up into payments over time. Unfortunately, all the PAP options for DOAC's require patient to meet a 3-4% out of pocket spending for drugs for 2025 based on their annual income. Patient's likely won't meet any PAP requirements until later on in the year once they paid some towards their deductible.

## 2023-03-27 MED ORDER — APIXABAN 5 MG PO TABS: 5.0000 mg | ORAL_TABLET | Freq: Two times a day (BID) | ORAL | 0 refills | Status: DC

## 2023-03-27 NOTE — Telephone Encounter (Signed)
Patient notified and verbalized understanding. Pt will contact his insurance company to set up payment plan.   Patient will come to [facility] office to pick up samples.

## 2023-03-30 ENCOUNTER — Ambulatory Visit (HOSPITAL_COMMUNITY): Admission: RE | Admit: 2023-03-30 | Payer: Medicare HMO | Source: Ambulatory Visit

## 2023-04-17 ENCOUNTER — Encounter (HOSPITAL_COMMUNITY): Payer: Self-pay

## 2023-04-20 ENCOUNTER — Telehealth (HOSPITAL_COMMUNITY): Payer: Self-pay | Admitting: *Deleted

## 2023-04-20 NOTE — Telephone Encounter (Signed)
 Attempted to call patient regarding upcoming cardiac CT appointment. Left message on voicemail with name and callback number  Larey Brick RN Navigator Cardiac Imaging [facility] [phone] Office [phone] Cell

## 2023-04-21 ENCOUNTER — Ambulatory Visit (HOSPITAL_BASED_OUTPATIENT_CLINIC_OR_DEPARTMENT_OTHER): Admission: RE | Admit: 2023-04-21 | Payer: Medicare HMO | Source: Ambulatory Visit

## 2023-05-18 ENCOUNTER — Other Ambulatory Visit: Payer: Self-pay

## 2023-05-18 ENCOUNTER — Encounter (HOSPITAL_COMMUNITY): Payer: Self-pay

## 2023-05-18 ENCOUNTER — Emergency Department (HOSPITAL_COMMUNITY)

## 2023-05-18 ENCOUNTER — Inpatient Hospital Stay (HOSPITAL_COMMUNITY)
Admission: EM | Admit: 2023-05-18 | Discharge: 2023-05-23 | DRG: 602 | Discharge status: Against Medical Advice | Attending: Family Medicine | Admitting: Family Medicine

## 2023-05-18 DIAGNOSIS — G4730 Sleep apnea, unspecified: Secondary | ICD-10-CM | POA: Diagnosis present

## 2023-05-18 DIAGNOSIS — Z79899 Other long term (current) drug therapy: Secondary | ICD-10-CM | POA: Diagnosis not present

## 2023-05-18 DIAGNOSIS — Z7901 Long term (current) use of anticoagulants: Secondary | ICD-10-CM

## 2023-05-18 DIAGNOSIS — I4819 Other persistent atrial fibrillation: Secondary | ICD-10-CM | POA: Diagnosis present

## 2023-05-18 DIAGNOSIS — I429 Cardiomyopathy, unspecified: Secondary | ICD-10-CM | POA: Diagnosis not present

## 2023-05-18 DIAGNOSIS — L02415 Cutaneous abscess of right lower limb: Secondary | ICD-10-CM | POA: Diagnosis present

## 2023-05-18 DIAGNOSIS — R000 Tachycardia, unspecified: Secondary | ICD-10-CM | POA: Diagnosis present

## 2023-05-18 DIAGNOSIS — Z5329 Procedure and treatment not carried out because of patient's decision for other reasons: Secondary | ICD-10-CM | POA: Diagnosis not present

## 2023-05-18 DIAGNOSIS — R0602 Shortness of breath: Secondary | ICD-10-CM | POA: Diagnosis not present

## 2023-05-18 DIAGNOSIS — I110 Hypertensive heart disease with heart failure: Secondary | ICD-10-CM | POA: Diagnosis not present

## 2023-05-18 DIAGNOSIS — E871 Hypo-osmolality and hyponatremia: Secondary | ICD-10-CM | POA: Diagnosis not present

## 2023-05-18 DIAGNOSIS — Z881 Allergy status to other antibiotic agents status: Secondary | ICD-10-CM | POA: Diagnosis not present

## 2023-05-18 DIAGNOSIS — R0989 Other specified symptoms and signs involving the circulatory and respiratory systems: Secondary | ICD-10-CM | POA: Diagnosis not present

## 2023-05-18 DIAGNOSIS — Z8546 Personal history of malignant neoplasm of prostate: Secondary | ICD-10-CM | POA: Diagnosis not present

## 2023-05-18 DIAGNOSIS — I444 Left anterior fascicular block: Secondary | ICD-10-CM | POA: Diagnosis present

## 2023-05-18 DIAGNOSIS — Z6833 Body mass index (BMI) 33.0-33.9, adult: Secondary | ICD-10-CM

## 2023-05-18 DIAGNOSIS — I509 Heart failure, unspecified: Secondary | ICD-10-CM | POA: Diagnosis not present

## 2023-05-18 DIAGNOSIS — Z8249 Family history of ischemic heart disease and other diseases of the circulatory system: Secondary | ICD-10-CM | POA: Diagnosis not present

## 2023-05-18 DIAGNOSIS — E876 Hypokalemia: Secondary | ICD-10-CM | POA: Diagnosis present

## 2023-05-18 DIAGNOSIS — I7121 Aneurysm of the ascending aorta, without rupture: Secondary | ICD-10-CM | POA: Diagnosis present

## 2023-05-18 DIAGNOSIS — L03115 Cellulitis of right lower limb: Principal | ICD-10-CM

## 2023-05-18 DIAGNOSIS — E66811 Obesity, class 1: Secondary | ICD-10-CM | POA: Diagnosis not present

## 2023-05-18 DIAGNOSIS — M79604 Pain in right leg: Secondary | ICD-10-CM | POA: Diagnosis present

## 2023-05-18 DIAGNOSIS — I482 Chronic atrial fibrillation, unspecified: Secondary | ICD-10-CM

## 2023-05-18 DIAGNOSIS — I3139 Other pericardial effusion (noninflammatory): Secondary | ICD-10-CM | POA: Diagnosis present

## 2023-05-18 DIAGNOSIS — Z96653 Presence of artificial knee joint, bilateral: Secondary | ICD-10-CM | POA: Diagnosis present

## 2023-05-18 DIAGNOSIS — I5020 Unspecified systolic (congestive) heart failure: Secondary | ICD-10-CM | POA: Diagnosis not present

## 2023-05-18 DIAGNOSIS — I5023 Acute on chronic systolic (congestive) heart failure: Secondary | ICD-10-CM | POA: Diagnosis not present

## 2023-05-18 DIAGNOSIS — M1990 Unspecified osteoarthritis, unspecified site: Secondary | ICD-10-CM | POA: Diagnosis present

## 2023-05-18 DIAGNOSIS — I4891 Unspecified atrial fibrillation: Secondary | ICD-10-CM | POA: Diagnosis not present

## 2023-05-18 DIAGNOSIS — Z91148 Patient's other noncompliance with medication regimen for other reason: Secondary | ICD-10-CM | POA: Diagnosis not present

## 2023-05-18 DIAGNOSIS — M799 Soft tissue disorder, unspecified: Secondary | ICD-10-CM | POA: Diagnosis not present

## 2023-05-18 DIAGNOSIS — R918 Other nonspecific abnormal finding of lung field: Secondary | ICD-10-CM | POA: Diagnosis not present

## 2023-05-18 DIAGNOSIS — R600 Localized edema: Secondary | ICD-10-CM | POA: Diagnosis not present

## 2023-05-18 HISTORY — DX: Unspecified atrial fibrillation: I48.91

## 2023-05-18 LAB — CBC WITH DIFFERENTIAL/PLATELET
Abs Immature Granulocytes: 0.02 10*3/uL (ref 0.00–0.07)
Basophils Absolute: 0.1 10*3/uL (ref 0.0–0.1)
Basophils Relative: 1 %
Eosinophils Absolute: 0.1 10*3/uL (ref 0.0–0.5)
Eosinophils Relative: 2 %
HCT: 34.5 % — ABNORMAL LOW (ref 39.0–52.0)
Hemoglobin: 9.8 g/dL — ABNORMAL LOW (ref 13.0–17.0)
Immature Granulocytes: 0 %
Lymphocytes Relative: 10 %
Lymphs Abs: 0.6 10*3/uL — ABNORMAL LOW (ref 0.7–4.0)
MCH: 21.6 pg — ABNORMAL LOW (ref 26.0–34.0)
MCHC: 28.4 g/dL — ABNORMAL LOW (ref 30.0–36.0)
MCV: 76 fL — ABNORMAL LOW (ref 80.0–100.0)
Monocytes Absolute: 0.6 10*3/uL (ref 0.1–1.0)
Monocytes Relative: 9 %
Neutro Abs: 4.7 10*3/uL (ref 1.7–7.7)
Neutrophils Relative %: 78 %
Platelets: 331 10*3/uL (ref 150–400)
RBC: 4.54 MIL/uL (ref 4.22–5.81)
RDW: 17.4 % — ABNORMAL HIGH (ref 11.5–15.5)
WBC: 6.1 10*3/uL (ref 4.0–10.5)
nRBC: 0 % (ref 0.0–0.2)

## 2023-05-18 LAB — COMPREHENSIVE METABOLIC PANEL WITH GFR
ALT: 74 U/L — ABNORMAL HIGH (ref 0–44)
AST: 76 U/L — ABNORMAL HIGH (ref 15–41)
Albumin: 3.6 g/dL (ref 3.5–5.0)
Alkaline Phosphatase: 102 U/L (ref 38–126)
Anion gap: 14 (ref 5–15)
BUN: 15 mg/dL (ref 6–20)
CO2: 28 mmol/L (ref 22–32)
Calcium: 8.9 mg/dL (ref 8.9–10.3)
Chloride: 87 mmol/L — ABNORMAL LOW (ref 98–111)
Creatinine, Ser: 1.09 mg/dL (ref 0.61–1.24)
GFR, Estimated: >60 mL/min (ref >60)
Glucose, Bld: 148 mg/dL — ABNORMAL HIGH (ref 70–99)
Potassium: 3.4 mmol/L — ABNORMAL LOW (ref 3.5–5.1)
Sodium: 129 mmol/L — ABNORMAL LOW (ref 135–145)
Total Bilirubin: 1.9 mg/dL — ABNORMAL HIGH (ref 0.0–1.2)
Total Protein: 7.2 g/dL (ref 6.5–8.1)

## 2023-05-18 LAB — URINALYSIS, ROUTINE W REFLEX MICROSCOPIC
Bilirubin Urine: NEGATIVE
Glucose, UA: NEGATIVE mg/dL
Hgb urine dipstick: NEGATIVE
Ketones, ur: NEGATIVE mg/dL
Leukocytes,Ua: NEGATIVE
Nitrite: NEGATIVE
Protein, ur: NEGATIVE mg/dL
Specific Gravity, Urine: 1.005 (ref 1.005–1.030)
pH: 6 (ref 5.0–8.0)

## 2023-05-18 LAB — TROPONIN I (HIGH SENSITIVITY)
Troponin I (High Sensitivity): 11 ng/L (ref <18)
Troponin I (High Sensitivity): 11 ng/L (ref <18)

## 2023-05-18 LAB — BRAIN NATRIURETIC PEPTIDE: B Natriuretic Peptide: 682 pg/mL — ABNORMAL HIGH (ref 0.0–100.0)

## 2023-05-18 LAB — LACTIC ACID, PLASMA
Lactic Acid, Venous: 1.8 mmol/L (ref 0.5–1.9)
Lactic Acid, Venous: 1.9 mmol/L (ref 0.5–1.9)

## 2023-05-18 LAB — RAPID URINE DRUG SCREEN, HOSP PERFORMED
Amphetamines: NOT DETECTED
Barbiturates: NOT DETECTED
Benzodiazepines: NOT DETECTED
Cocaine: POSITIVE — AB
Opiates: NOT DETECTED
Tetrahydrocannabinol: NOT DETECTED

## 2023-05-18 LAB — LIPASE, BLOOD: Lipase: 41 U/L (ref 11–51)

## 2023-05-18 MED ORDER — DILTIAZEM HCL-DEXTROSE 125-5 MG/125ML-% IV SOLN (PREMIX)
5.0000 mg/h | INTRAVENOUS | Status: DC
  Administered 2023-05-18: 5 mg/h via INTRAVENOUS
  Administered 2023-05-19: 10 mg/h via INTRAVENOUS
  Filled 2023-05-18 (×2): qty 125

## 2023-05-18 MED ORDER — HYDROCODONE-ACETAMINOPHEN 5-325 MG PO TABS
1.0000 | ORAL_TABLET | Freq: Four times a day (QID) | ORAL | Status: DC | PRN
  Administered 2023-05-18 – 2023-05-23 (×13): 1 via ORAL
  Filled 2023-05-18 (×13): qty 1

## 2023-05-18 MED ORDER — POLYETHYLENE GLYCOL 3350 17 G PO PACK
17.0000 g | PACK | Freq: Every day | ORAL | Status: DC
  Administered 2023-05-18 – 2023-05-22 (×5): 17 g via ORAL
  Filled 2023-05-18 (×7): qty 1

## 2023-05-18 MED ORDER — APIXABAN 5 MG PO TABS
5.0000 mg | ORAL_TABLET | Freq: Two times a day (BID) | ORAL | Status: DC
  Administered 2023-05-18 – 2023-05-23 (×11): 5 mg via ORAL
  Filled 2023-05-18 (×11): qty 1

## 2023-05-18 MED ORDER — FUROSEMIDE 10 MG/ML IJ SOLN
40.0000 mg | Freq: Once | INTRAMUSCULAR | Status: AC
  Administered 2023-05-18: 40 mg via INTRAVENOUS
  Filled 2023-05-18: qty 4

## 2023-05-18 MED ORDER — POTASSIUM CHLORIDE CRYS ER 20 MEQ PO TBCR: 40.0000 meq | EXTENDED_RELEASE_TABLET | Freq: Once | ORAL | Status: DC

## 2023-05-18 MED ORDER — BUDESONIDE 0.5 MG/2ML IN SUSP
0.5000 mg | Freq: Two times a day (BID) | RESPIRATORY_TRACT | Status: DC
  Administered 2023-05-18 – 2023-05-23 (×10): 0.5 mg via RESPIRATORY_TRACT
  Filled 2023-05-18 (×10): qty 2

## 2023-05-18 MED ORDER — DILTIAZEM LOAD VIA INFUSION
10.0000 mg | Freq: Once | INTRAVENOUS | Status: AC
  Administered 2023-05-18: 10 mg via INTRAVENOUS
  Filled 2023-05-18: qty 10

## 2023-05-18 MED ORDER — VANCOMYCIN HCL IN DEXTROSE 1-5 GM/200ML-% IV SOLN
1000.0000 mg | Freq: Once | INTRAVENOUS | Status: AC
Start: 2023-05-18 — End: 2023-05-18
  Administered 2023-05-18: 1000 mg via INTRAVENOUS
  Filled 2023-05-18: qty 200

## 2023-05-18 MED ORDER — ALBUTEROL SULFATE (2.5 MG/3ML) 0.083% IN NEBU
2.5000 mg | INHALATION_SOLUTION | RESPIRATORY_TRACT | Status: DC | PRN
  Administered 2023-05-18: 2.5 mg via RESPIRATORY_TRACT
  Filled 2023-05-18: qty 3

## 2023-05-18 MED ORDER — BISACODYL 5 MG PO TBEC: 5.0000 mg | DELAYED_RELEASE_TABLET | Freq: Every day | ORAL | Status: DC | PRN

## 2023-05-18 MED ORDER — LOSARTAN POTASSIUM 25 MG PO TABS
25.0000 mg | ORAL_TABLET | Freq: Every day | ORAL | Status: DC
  Administered 2023-05-19 – 2023-05-21 (×3): 25 mg via ORAL
  Filled 2023-05-18 (×3): qty 1

## 2023-05-18 MED ORDER — ACETAMINOPHEN 650 MG RE SUPP: 650.0000 mg | Freq: Four times a day (QID) | RECTAL | Status: DC | PRN

## 2023-05-18 MED ORDER — IPRATROPIUM-ALBUTEROL 0.5-2.5 (3) MG/3ML IN SOLN
3.0000 mL | Freq: Four times a day (QID) | RESPIRATORY_TRACT | Status: DC
  Filled 2023-05-18: qty 3

## 2023-05-18 MED ORDER — DIPHENHYDRAMINE HCL 50 MG/ML IJ SOLN: 25.0000 mg | Freq: Once | INTRAMUSCULAR | Status: DC

## 2023-05-18 MED ORDER — ACETAMINOPHEN 325 MG PO TABS: 650.0000 mg | ORAL_TABLET | Freq: Four times a day (QID) | ORAL | Status: DC | PRN

## 2023-05-18 MED ORDER — CHLORHEXIDINE GLUCONATE CLOTH 2 % EX PADS
6.0000 | MEDICATED_PAD | Freq: Every day | CUTANEOUS | Status: DC
  Administered 2023-05-18: 6 via TOPICAL

## 2023-05-18 MED ORDER — ONDANSETRON HCL 4 MG PO TABS: 4.0000 mg | ORAL_TABLET | Freq: Four times a day (QID) | ORAL | Status: DC | PRN

## 2023-05-18 MED ORDER — FUROSEMIDE 10 MG/ML IJ SOLN
60.0000 mg | Freq: Two times a day (BID) | INTRAMUSCULAR | Status: DC
  Administered 2023-05-18 – 2023-05-23 (×9): 60 mg via INTRAVENOUS
  Filled 2023-05-18 (×10): qty 6

## 2023-05-18 MED ORDER — CARVEDILOL 12.5 MG PO TABS
6.2500 mg | ORAL_TABLET | Freq: Two times a day (BID) | ORAL | Status: DC
  Administered 2023-05-19 – 2023-05-20 (×3): 6.25 mg via ORAL
  Filled 2023-05-18 (×2): qty 1
  Filled 2023-05-18: qty 2

## 2023-05-18 MED ORDER — CARVEDILOL 3.125 MG PO TABS
3.1250 mg | ORAL_TABLET | Freq: Two times a day (BID) | ORAL | Status: DC
  Administered 2023-05-18: 3.125 mg via ORAL
  Filled 2023-05-18: qty 1

## 2023-05-18 MED ORDER — VANCOMYCIN HCL IN DEXTROSE 1-5 GM/200ML-% IV SOLN
1000.0000 mg | Freq: Once | INTRAVENOUS | Status: AC
  Administered 2023-05-18: 1000 mg via INTRAVENOUS
  Filled 2023-05-18: qty 200

## 2023-05-18 MED ORDER — DILTIAZEM HCL 25 MG/5ML IV SOLN
10.0000 mg | Freq: Once | INTRAVENOUS | Status: AC
Start: 2023-05-18 — End: 2023-05-18
  Administered 2023-05-18: 10 mg via INTRAVENOUS
  Filled 2023-05-18: qty 5

## 2023-05-18 MED ORDER — ONDANSETRON HCL 4 MG/2ML IJ SOLN: 4.0000 mg | Freq: Four times a day (QID) | INTRAMUSCULAR | Status: DC | PRN

## 2023-05-18 MED ORDER — ALBUTEROL SULFATE HFA 108 (90 BASE) MCG/ACT IN AERS: 1.0000 | INHALATION_SPRAY | RESPIRATORY_TRACT | Status: DC | PRN

## 2023-05-18 NOTE — H&P (Addendum)
 History and Physical    Andrew Haley:[id number] DOB: [birth year] DOA: 05/18/2023 PCP: Elfredia Nevins, MD  Chief Complaint: R leg pain Historian: patient  HPI:  Andrew Haley is a 58 y.o. male with a PMH significant for Afib, CHF, alcohol use, cocaine use, anemia, prostate cancer, HTN, obesity, cardiomyopathy. At baseline, they live at home and are independent with ADLs.  They presented from home to the ED on 05/18/2023 with R leg pain x several days. He states that the leg has been hurting due to abscess that was previously being packed and thinks it has retained "cord" in it. Endorses purulent drainage.  He has not taken any of his chronic medications in >2 weeks due to cost and having other expenses take priority.  Endorses SOB at rest, worsened with exertion. Has LE swelling. Denies nausea, CP, or diaphoresis. No palpitations. Has been without eliquis since beginning of the year.   In the ED, it was found that they had afib with RVR up to 130sHR. Otherwise stable vitals ORA. Appears to have increased WOB.  Significant findings included: Na+ 129, K+ 3.4, glucose 148, Cr 1.09. hgb at baseline of 9.8. WBC 6.1. BNP 682. UDS positive for cocaine. Troponin 11>11.  Chest xray positive for pulmonary edema.   They were initially treated with diltiazem gtt, coreg, IV lasix 40mg , vancomycin.   Patient was admitted to medicine service for further workup and management of cellulitis, CHF exacerbation as outlined in detail below.  Assessment/Plan Principal Problem:   Atrial fibrillation (HCC) Active Problems:   Acute on chronic congestive heart failure (HCC)   RLE cellulitis  abscess s/p I&D- spontaneously draining.  - [place] to evaluate for residual iodoform vs residual abscess - s/p vancomycin. Will continue as well as unasyn. Patient has several antibiotic allergies - monitor for fever - f/u blood cultures  Acute on chronic HFrEF- due to medication non-adherence. Pulmonary edema on cxray  and rales on exam. Significant LE edema. Already had >3L UOP since lasix given. EF 25-30%. BNP 682 - TOC consult for medication assistance.  - daily weights - continue IV lasix - strict I/O - no echo considering this is result of medication nonadherence and just had one 12/24  Afib RVR- HR up to 130s on presentation. Improved on diltiazem gtt. - continue dilt gtt and titrate off CCB when able - continue home eliquis, carvedilol at higher dose  HTN - continue home losartan, spironolactone  Hyponatremia- related to hypervolemia. Na+ 129 - BMP am  Past Medical History:  Diagnosis Date   Arthritis    Atrial fibrillation (HCC)    Class 1 obesity 07/18/2020   Complication of anesthesia    slow to wake up   Dysrhythmia    Essential hypertension 07/18/2020   Headache    Hypertension    Hypertriglyceridemia    Numbness of fingers of both hands    Prostate cancer (HCC) 2017   Sleep apnea    CPAP    Past Surgical History:  Procedure Laterality Date   ANKLE SURGERY Left    COLONOSCOPY WITH PROPOFOL N/A 09/28/2017   Procedure: COLONOSCOPY WITH PROPOFOL;  Surgeon: Corbin Ade, MD;  Location: [facility];  Service: Endoscopy;  Laterality: N/A;  12:00pm   HAND SURGERY Right 2003   cysts    JOINT REPLACEMENT     LESION REMOVAL Left 09/23/2013   Procedure: MINOR EXCISION 3 CM SKIN LESION OF LEFT THIGH ;  Surgeon: Dalia Heading, MD;  Location: [facility];  Service: General;  Laterality: Left;   LYMPHADENECTOMY Bilateral 11/29/2015   Procedure: LYMPHADENECTOMY;  Surgeon: Heloise Purpura, MD;  Location: [facility];  Service: Urology;  Laterality: Bilateral;   ROBOT ASSISTED LAPAROSCOPIC RADICAL PROSTATECTOMY N/A 11/29/2015   Procedure: XI ROBOTIC ASSISTED LAPAROSCOPIC RADICAL PROSTATECTOMY LEVEL 3;  Surgeon: Heloise Purpura, MD;  Location: [facility];  Service: Urology;  Laterality: N/A;   TOTAL KNEE ARTHROPLASTY Left 02/22/2014   Procedure: LEFT TOTAL KNEE ARTHROPLASTY;  Surgeon: Jacki Cones, MD;  Location: [facility];  Service: Orthopedics;  Laterality: Left;   TOTAL KNEE ARTHROPLASTY Right 05/23/2014   Procedure: RIGHT TOTAL KNEE ARTHROPLASTY;  Surgeon: Ranee Gosselin, MD;  Location: [facility];  Service: Orthopedics;  Laterality: Right;   TOTAL KNEE REVISION Right 03/04/2021   Procedure: TOTAL KNEE REVISION;  Surgeon: Durene Romans, MD;  Location: [facility];  Service: Orthopedics;  Laterality: Right;     reports that he has never smoked. He has never used smokeless tobacco. He reports current alcohol use. He reports that he does not currently use drugs after having used the following drugs: Cocaine.  Allergies  Allergen Reactions   Ertapenem Hives   Cefazolin Hives   Ceftriaxone Other (See Comments)    Itching, sweating and anxiety with ceftriaxone .     Family History  Problem Relation Age of Onset   Heart disease Mother    Throat cancer Father    Colon cancer Neg Hx    Colon polyps Neg Hx     Prior to Admission medications   Medication Sig Start Date End Date Taking? Authorizing Provider  apixaban (ELIQUIS) 5 MG TABS tablet Take 1 tablet (5 mg total) by mouth 2 (two) times daily. 08/02/22   Johnson, Clanford L, MD  apixaban (ELIQUIS) 5 MG TABS tablet Take 1 tablet (5 mg total) by mouth 2 (two) times daily. 03/27/23   Mallipeddi, Vishnu P, MD  carvedilol (COREG) 3.125 MG tablet Take 3.125 mg by mouth 2 (two) times daily. 02/19/23   [provider]  carvedilol (COREG) 6.25 MG tablet Take 1 tablet (6.25 mg total) by mouth once for 1 dose. 2 hours prior to procedure 03/11/23 03/11/23  Mallipeddi, Vishnu P, MD  folic acid (FOLVITE) 1 MG tablet Take 1 tablet (1 mg total) by mouth daily. 08/03/22   Johnson, Clanford L, MD  furosemide (LASIX) 40 MG tablet Take 1 tablet (40 mg total) by mouth daily. Patient taking differently: Take 40 mg by mouth daily as needed for fluid or edema. 08/02/22   Johnson, Clanford L, MD  losartan (COZAAR) 25 MG tablet Take 0.5 tablets (12.5 mg  total) by mouth daily. 08/02/22   Johnson, Clanford L, MD  potassium chloride SA (KLOR-CON M) 20 MEQ tablet Take 1 tablet (20 mEq total) by mouth daily. 08/02/22   Johnson, Clanford L, MD  spironolactone (ALDACTONE) 25 MG tablet Take 1 tablet (25 mg total) by mouth daily. 03/10/23   Mallipeddi, Vishnu P, MD   I have personally, briefly reviewed patient's prior medical records in [place] [facility]  Objective: Blood pressure (!) 131/94, pulse (!) 120, temperature 97.8 F (36.6 C), temperature source Oral, resp. rate 17, height 6\' 3"  (1.905 m), weight 120.2 kg, SpO2 95%.   Constitutional: NAD, calm, comfortable Neck: normal, supple, no masses, no thyromegaly Respiratory: normal WOB. No conversational dyspnea. Rales throughout.  Cardiovascular: RRR, no murmurs / rubs / gallops. 3+ pitting edema bilateral LE Skin: spontaneously draining R mid shin lesion with purulent drainage. Surrounding  erythema Neurologic: Alert and oriented x 3. Normal speech. Grossly non-focal exam. PERRL Psychiatric: Normal mood. Congruent affect.  Labs on Admission: I have personally reviewed admission labs and imaging studies  CBC    Component Value Date/Time   WBC 6.1 05/18/2023 1417   RBC 4.54 05/18/2023 1417   HGB 9.8 (L) 05/18/2023 1417   HCT 34.5 (L) 05/18/2023 1417   PLT 331 05/18/2023 1417   MCV 76.0 (L) 05/18/2023 1417   MCH 21.6 (L) 05/18/2023 1417   MCHC 28.4 (L) 05/18/2023 1417   RDW 17.4 (H) 05/18/2023 1417   LYMPHSABS 0.6 (L) 05/18/2023 1417   MONOABS 0.6 05/18/2023 1417   EOSABS 0.1 05/18/2023 1417   BASOSABS 0.1 05/18/2023 1417   CMP     Component Value Date/Time   NA 129 (L) 05/18/2023 1417   K 3.4 (L) 05/18/2023 1417   CL 87 (L) 05/18/2023 1417   CO2 28 05/18/2023 1417   GLUCOSE 148 (H) 05/18/2023 1417   BUN 15 05/18/2023 1417   CREATININE 1.09 05/18/2023 1417   CALCIUM 8.9 05/18/2023 1417   PROT 7.2 05/18/2023 1417   ALBUMIN 3.6 05/18/2023 1417   AST 76 (H) 05/18/2023 1417   ALT 74  (H) 05/18/2023 1417   ALKPHOS 102 05/18/2023 1417   BILITOT 1.9 (H) 05/18/2023 1417   GFRNONAA >60 05/18/2023 1417   GFRAA >60 09/23/2017 1510    Radiological Exams on Admission: DG Chest 2 View Result Date: 05/18/2023 CLINICAL DATA:  Shortness of breath EXAM: CHEST - 2 VIEW COMPARISON:  Chest radiograph dated 02/16/2023 FINDINGS: Low lung volumes with bronchovascular crowding. Mild diffuse interstitial opacities. Blunting of the costophrenic angles. No pneumothorax. Similar enlarged cardiomediastinal silhouette. No acute osseous abnormality. IMPRESSION: 1. Mild diffuse interstitial opacities, which may represent pulmonary edema. 2. Blunting of the costophrenic angles, which may represent small pleural effusions. 3. Similar cardiomegaly. Electronically Signed   By: Agustin Cree M.D.   On: 05/18/2023 15:39   EKG: Independently reviewed. afib  DVT prophylaxis:  apixaban (ELIQUIS) tablet 5 mg   Code Status: full  Family Communication: none at bedside  Disposition Plan: med tele  Consults called: none    Leeroy Bock, DO [facility]  05/18/2023, 7:59 PM    To contact the appropriate [facility] Attending or Consulting provider: Check [email or web address] for coverage from 7pm-7am

## 2023-05-18 NOTE — ED Notes (Signed)
 Pt care taken, resting, wanting something to eat. Gave patient crackers.

## 2023-05-18 NOTE — ED Triage Notes (Signed)
Disregard previous note. Made in error.

## 2023-05-18 NOTE — ED Provider Notes (Signed)
 [place] [facility] AT [facility] Provider Note   CSN: [id number] Arrival date & time: 05/18/23  1326     History  Chief Complaint  Patient presents with   Shortness of Breath    KAEGAN HETTICH is a 58 y.o. male.  He has history of A-fib, CHF, cardiomyopathy.  Today complaining of shortness of breath and lower extremity swelling.  Reports he has been off all of his medications for his furosemide for the past 2 weeks, as needed medication to pay the court so he would not have to go to jail.  He has not been taking his Eliquis, his carvedilol, his spironolactone or any of his other medications aside from his furosemide.  He denies fever or chills but states his right lower extremity wound has started draining again and is now red.  He had a recent hospitalization where this was treated with IV antibiotics.  He states initially had gotten what was almost completely healed and now is getting worse, he is having redness to his lower extremity.  He also reports that the shortness of breath that he has been having is worse when lying flat, better when sitting up.  He has cough.  Denies chest pain.  Denies nausea or vomiting.  No fevers or chills.  No other complaints.   Shortness of Breath      Home Medications Prior to Admission medications   Medication Sig Start Date End Date Taking? Authorizing Provider  apixaban (ELIQUIS) 5 MG TABS tablet Take 1 tablet (5 mg total) by mouth 2 (two) times daily. 08/02/22   Johnson, Clanford L, MD  apixaban (ELIQUIS) 5 MG TABS tablet Take 1 tablet (5 mg total) by mouth 2 (two) times daily. 03/27/23   Mallipeddi, Vishnu P, MD  carvedilol (COREG) 3.125 MG tablet Take 3.125 mg by mouth 2 (two) times daily. 02/19/23   [provider]  carvedilol (COREG) 6.25 MG tablet Take 1 tablet (6.25 mg total) by mouth once for 1 dose. 2 hours prior to procedure 03/11/23 03/11/23  Mallipeddi, Vishnu P, MD  folic acid (FOLVITE) 1 MG tablet Take 1  tablet (1 mg total) by mouth daily. 08/03/22   Johnson, Clanford L, MD  furosemide (LASIX) 40 MG tablet Take 1 tablet (40 mg total) by mouth daily. Patient taking differently: Take 40 mg by mouth daily as needed for fluid or edema. 08/02/22   Johnson, Clanford L, MD  losartan (COZAAR) 25 MG tablet Take 0.5 tablets (12.5 mg total) by mouth daily. 08/02/22   Johnson, Clanford L, MD  potassium chloride SA (KLOR-CON M) 20 MEQ tablet Take 1 tablet (20 mEq total) by mouth daily. 08/02/22   Johnson, Clanford L, MD  spironolactone (ALDACTONE) 25 MG tablet Take 1 tablet (25 mg total) by mouth daily. 03/10/23   Mallipeddi, Vishnu P, MD      Allergies    Ertapenem, Cefazolin, and Ceftriaxone    Review of Systems   Review of Systems  Respiratory:  Positive for shortness of breath.     Physical Exam Updated Vital Signs BP (!) 153/118   Pulse (!) 130   Temp 97.7 F (36.5 C)   Resp (!) 24   Ht 6\' 3"  (1.905 m)   Wt 120.2 kg   SpO2 96%   BMI 33.12 kg/m  Physical Exam Vitals and nursing note reviewed.  Constitutional:      General: He is not in acute distress.    Appearance: He is well-developed.  HENT:  Head: Normocephalic and atraumatic.  Eyes:     Conjunctiva/sclera: Conjunctivae normal.  Cardiovascular:     Rate and Rhythm: Normal rate and regular rhythm.     Heart sounds: No murmur heard. Pulmonary:     Effort: Pulmonary effort is normal. No respiratory distress.     Breath sounds: Examination of the right-lower field reveals rales. Examination of the left-lower field reveals rales. Rales present. No wheezing.  Abdominal:     Palpations: Abdomen is soft.     Tenderness: There is no abdominal tenderness.  Musculoskeletal:        General: No swelling.     Cervical back: Neck supple.     Right lower leg: Edema present.     Left lower leg: Edema present.  Skin:    General: Skin is warm and dry.     Capillary Refill: Capillary refill takes less than 2 seconds.     Comments: Small  anterior  lower leg wound with scant drainage, blanching erythema of anterior lower leg  Neurological:     General: No focal deficit present.     Mental Status: He is alert.  Psychiatric:        Mood and Affect: Mood normal.     ED Results / Procedures / Treatments   Labs (all labs ordered are listed, but only abnormal results are displayed) Labs Reviewed  CBC WITH DIFFERENTIAL/PLATELET - Abnormal; Notable for the following components:      Result Value   Hemoglobin 9.8 (*)    HCT 34.5 (*)    MCV 76.0 (*)    MCH 21.6 (*)    MCHC 28.4 (*)    RDW 17.4 (*)    Lymphs Abs 0.6 (*)    All other components within normal limits  COMPREHENSIVE METABOLIC PANEL WITH GFR  LIPASE, BLOOD  BRAIN NATRIURETIC PEPTIDE  RAPID URINE DRUG SCREEN, HOSP PERFORMED  TROPONIN I (HIGH SENSITIVITY)    EKG None  Radiology No results found.  Procedures .Critical Care  Performed by: Ma Rings, PA-C Authorized by: Ma Rings, PA-C   Critical care provider statement:    Critical care time (minutes):  30   Critical care time was exclusive of:  Separately billable procedures and treating other patients and teaching time   Critical care was necessary to treat or prevent imminent or life-threatening deterioration of the following conditions:  Cardiac failure   Critical care was time spent personally by me on the following activities:  Development of treatment plan with patient or surrogate, discussions with consultants, evaluation of patient's response to treatment, examination of patient, ordering and review of laboratory studies, ordering and review of radiographic studies, ordering and performing treatments and interventions, pulse oximetry, re-evaluation of patient's condition, review of old charts and obtaining history from patient or surrogate   Care discussed with: admitting provider     Care discussed with comment:  Dr. Dareen Piano     Medications Ordered in ED Medications   furosemide (LASIX) injection 40 mg (has no administration in time range)  apixaban (ELIQUIS) tablet 5 mg (has no administration in time range)    ED Course/ Medical Decision Making/ A&P                                 Medical Decision Making Differential diagnosis includes but not limited to CHF, COPD exacerbation, cellulitis, DVT, PE, pneumonia, medical noncompliance, other  ED course: Patient presents  to the ER complaining of lower extremity swelling, shortness of breath.  Also complaining of right leg redness and swelling.  He has known history of CHF, cardiomyopathy, A-fib.  He has only been taking his Lasix for the past 2 weeks no other medications due to not being able to afford his medications he reports.  He states his last cocaine use was 10 days ago.  He states he drinks only 6 beers per week, denies recent increase in his alcohol intake.  On exam he is tachycardic, EKG shows A-fib with RVR.  He is maintained on a cardiac monitor, rate was in the 120s to 130s.  Also has right lower extremity erythema anteriorly with a small minimally draining wound.  There is no surrounding crepitus, no palpable foreign body, no fluctuance or induration.  He has pitting edema in bilateral lower extremities.  Mild rales in his bases on lung exam.  Exam consistent with fluid overload due to congestive heart failure and great lower extremity cellulitis.    Radiology: X-ray reviewed and interpreted by me, shows a edema I agree with radiology read. Labs: Patient has negative troponin, UA normal, UDS shows positive cocaine, BNP elevated 682, CMP shows mild hyponatremia and hypokalemia.  CBC shows baseline hemoglobin, no leukocytosis.  Therapies: Patient given his home carvedilol after review of his cardiology note in the medical record.  Started on diltiazem given bolus and subsequently put on infusion as bolus did not improve heart rate.  After diltiazem infusion and heart rate improved to the 90s.  Systolic  blood pressure stayed in the 120s to 130s.  Is also given a milligrams of IV Lasix and diuresed 3 L of urine.  Having improvement of symptoms after medications.  He is also given vancomycin for his lower extremity cellulitis.  I consulted the hospitalist and spoke with Dr. Dareen Piano from [facility].  We discussed patient's labs, imaging and presentation.  She agrees with admission.    Amount and/or Complexity of Data Reviewed External Data Reviewed: labs, ECG and notes. Labs: ordered. Decision-making details documented in ED Course. Radiology: ordered.  Risk Prescription drug management. Decision regarding hospitalization.           Final Clinical Impression(s) / ED Diagnoses Final diagnoses:  None    Rx / DC Orders ED Discharge Orders     None         Josem Kaufmann 05/18/23 2154    Bethann Berkshire, MD 05/20/23 [id number]

## 2023-05-18 NOTE — ED Notes (Signed)
 ..ED TO INPATIENT HANDOFF REPORT  ED Nurse Name and Phone #: [phone]  S Name/Age/Gender Andrew Haley 58 y.o. male Room/Bed: APA02/APA02  Code Status   Code Status: Full Code  Home/SNF/Other Home Patient oriented to: self, place, time, and situation Is this baseline? Yes   Triage Complete: Triage complete  Chief Complaint Atrial fibrillation [facility]) [I48.91]  Triage Note Pt arrived via POV with multiple complaints. Pt reports financial issues have prevented him from picking up his medications. Pt reports bilateral leg swelling, SOB, A-fib. Pt denies chest pain. Pt has open sore, oozing fluid on right lower leg.  Disregard previous note. Made in error.   Allergies Allergies  Allergen Reactions   Ertapenem Hives   Cefazolin Hives   Ceftriaxone Other (See Comments)    Itching, sweating and anxiety with ceftriaxone .     Level of Care/Admitting Diagnosis ED Disposition     ED Disposition  Admit   Condition  --   Comment  Hospital Area: [facility] [[id number]]  Level of Care: Stepdown [14]  Covid Evaluation: Asymptomatic - no recent exposure (last 10 days) testing not required  Diagnosis: Atrial fibrillation (HCC) [427.31.ICD-9-CM]  Admitting Physician: Leeroy Bock [[id number]]  Attending Physician: Leeroy Bock [[id number]]  Certification:: I certify this patient will need inpatient services for at least 2 midnights  Expected Medical Readiness: 05/21/2023          B Medical/Surgery History Past Medical History:  Diagnosis Date   Arthritis    Atrial fibrillation (HCC)    Class 1 obesity 07/18/2020   Complication of anesthesia    slow to wake up   Dysrhythmia    Essential hypertension 07/18/2020   Headache    Hypertension    Hypertriglyceridemia    Numbness of fingers of both hands    Prostate cancer (HCC) 2017   Sleep apnea    CPAP   Past Surgical History:  Procedure Laterality Date   ANKLE SURGERY Left    COLONOSCOPY WITH  PROPOFOL N/A 09/28/2017   Procedure: COLONOSCOPY WITH PROPOFOL;  Surgeon: Corbin Ade, MD;  Location[facility];  Service: Endoscopy;  Laterality: N/A;  12:00pm   HAND SURGERY Right 2003   cysts    JOINT REPLACEMENT     LESION REMOVAL Left 09/23/2013   Procedure: MINOR EXCISION 3 CM SKIN LESION OF LEFT THIGH ;  Surgeon: Dalia Heading, MD;  Location: [facility] ORS;  Service: General;  Laterality: Left;   LYMPHADENECTOMY Bilateral 11/29/2015   Procedure: LYMPHADENECTOMY;  Surgeon: Heloise Purpura, MD;  Location: [facility] ORS;  Service: Urology;  Laterality: Bilateral;   ROBOT ASSISTED LAPAROSCOPIC RADICAL PROSTATECTOMY N/A 11/29/2015   Procedure: XI ROBOTIC ASSISTED LAPAROSCOPIC RADICAL PROSTATECTOMY LEVEL 3;  Surgeon: Heloise Purpura, MD;  Location: [facility];  Service: Urology;  Laterality: N/A;   TOTAL KNEE ARTHROPLASTY Left 02/22/2014   Procedure: LEFT TOTAL KNEE ARTHROPLASTY;  Surgeon: Jacki Cones, MD;  Location: [facility];  Service: Orthopedics;  Laterality: Left;   TOTAL KNEE ARTHROPLASTY Right 05/23/2014   Procedure: RIGHT TOTAL KNEE ARTHROPLASTY;  Surgeon: Ranee Gosselin, MD;  Location: [facility];  Service: Orthopedics;  Laterality: Right;   TOTAL KNEE REVISION Right 03/04/2021   Procedure: TOTAL KNEE REVISION;  Surgeon: Durene Romans, MD;  Location: [facility];  Service: Orthopedics;  Laterality: Right;     A IV Location/Drains/Wounds Patient Lines/Drains/Airways Status     Active Line/Drains/Airways     Name Placement date Placement time Site Days   Peripheral  IV 05/18/23 20 G 1" Posterior;Right Forearm 05/18/23  1436  Forearm  less than 1   Wound / Incision (Open or Dehisced) 02/13/23 Laceration Heel Right scabbed laceration 02/13/23  2215  Heel  94   Wound / Incision (Open or Dehisced) 02/13/23 Laceration Heel Left healing laceration; dried 02/13/23  2215  Heel  94   Wound / Incision (Open or Dehisced) 02/13/23 Non-pressure wound Pretibial Right open & scabbed areas 02/13/23  2215  Pretibial   94            Intake/Output Last 24 hours  Intake/Output Summary (Last 24 hours) at 05/18/2023 2116 Last data filed at 05/18/2023 1903 Gross per 24 hour  Intake 220.77 ml  Output 3100 ml  Net -2879.23 ml    Labs/Imaging Results for orders placed or performed during the hospital encounter of 05/18/23 (from the past 48 hours)  Comprehensive metabolic panel     Status: Abnormal   Collection Time: 05/18/23  2:17 PM  Result Value Ref Range   Sodium 129 (L) 135 - 145 mmol/L   Potassium 3.4 (L) 3.5 - 5.1 mmol/L   Chloride 87 (L) 98 - 111 mmol/L   CO2 28 22 - 32 mmol/L   Glucose, Bld 148 (H) 70 - 99 mg/dL    Comment: Glucose reference range applies only to samples taken after fasting for at least 8 hours.   BUN 15 6 - 20 mg/dL   Creatinine, Ser 5.62 0.61 - 1.24 mg/dL   Calcium 8.9 8.9 - 13.0 mg/dL   Total Protein 7.2 6.5 - 8.1 g/dL   Albumin 3.6 3.5 - 5.0 g/dL   AST 76 (H) 15 - 41 U/L   ALT 74 (H) 0 - 44 U/L   Alkaline Phosphatase 102 38 - 126 U/L   Total Bilirubin 1.9 (H) 0.0 - 1.2 mg/dL   GFR, Estimated >86 >57 mL/min    Comment: (NOTE) Calculated using the CKD-EPI Creatinine Equation (2021)    Anion gap 14 5 - 15    Comment: Performed at [facility], [address] [place], [place] [address]  CBC with Differential     Status: Abnormal   Collection Time: 05/18/23  2:17 PM  Result Value Ref Range   WBC 6.1 4.0 - 10.5 K/uL   RBC 4.54 4.22 - 5.81 MIL/uL   Hemoglobin 9.8 (L) 13.0 - 17.0 g/dL   HCT 29.5 (L) 28.4 - 13.2 %   MCV 76.0 (L) 80.0 - 100.0 fL   MCH 21.6 (L) 26.0 - 34.0 pg   MCHC 28.4 (L) 30.0 - 36.0 g/dL   RDW 44.0 (H) 10.2 - 72.5 %   Platelets 331 150 - 400 K/uL   nRBC 0.0 0.0 - 0.2 %   Neutrophils Relative % 78 %   Neutro Abs 4.7 1.7 - 7.7 K/uL   Lymphocytes Relative 10 %   Lymphs Abs 0.6 (L) 0.7 - 4.0 K/uL   Monocytes Relative 9 %   Monocytes Absolute 0.6 0.1 - 1.0 K/uL   Eosinophils Relative 2 %   Eosinophils Absolute 0.1 0.0 - 0.5 K/uL   Basophils  Relative 1 %   Basophils Absolute 0.1 0.0 - 0.1 K/uL   Immature Granulocytes 0 %   Abs Immature Granulocytes 0.02 0.00 - 0.07 K/uL    Comment: Performed at [facility], [address], [place], [place] [address]  Lipase, blood     Status: None   Collection Time: 05/18/23  2:17 PM  Result Value Ref Range  Lipase 41 11 - 51 U/L    Comment: Performed at [facility], [address] [place], [place] [address]  Troponin I (High Sensitivity)     Status: None   Collection Time: 05/18/23  2:17 PM  Result Value Ref Range   Troponin I (High Sensitivity) 11 <18 ng/L    Comment: (NOTE) Elevated high sensitivity troponin I (hsTnI) values and significant  changes across serial measurements may suggest ACS but many other  chronic and acute conditions are known to elevate hsTnI results.  Refer to the "Links" section for chest pain algorithms and additional  guidance. Performed at [facility], [address] [place], [place] [address]   Brain natriuretic peptide     Status: Abnormal   Collection Time: 05/18/23  2:17 PM  Result Value Ref Range   B Natriuretic Peptide 682.0 (H) 0.0 - 100.0 pg/mL    Comment: Performed at [facility], [address] [place], [place] [address]  Lactic acid, plasma     Status: None   Collection Time: 05/18/23  2:54 PM  Result Value Ref Range   Lactic Acid, Venous 1.8 0.5 - 1.9 mmol/L    Comment: Performed at [facility], [address], [place], [place] [address]  Rapid urine drug screen (hospital performed)     Status: Abnormal   Collection Time: 05/18/23  3:05 PM  Result Value Ref Range   Opiates NONE DETECTED NONE DETECTED   Cocaine POSITIVE (A) NONE DETECTED   Benzodiazepines NONE DETECTED NONE DETECTED   Amphetamines NONE DETECTED NONE DETECTED   Tetrahydrocannabinol NONE DETECTED NONE DETECTED   Barbiturates NONE DETECTED NONE DETECTED    Comment: (NOTE) DRUG SCREEN FOR MEDICAL PURPOSES ONLY.  IF CONFIRMATION IS NEEDED FOR ANY PURPOSE, NOTIFY  LAB WITHIN 5 DAYS.  LOWEST DETECTABLE LIMITS FOR URINE DRUG SCREEN Drug Class                     Cutoff (ng/mL) Amphetamine and metabolites    1000 Barbiturate and metabolites    200 Benzodiazepine                 200 Opiates and metabolites        300 Cocaine and metabolites        300 THC                            50 Performed at [facility], [address], [place], [place] [address]   Urinalysis, Routine w reflex microscopic -Urine, Clean Catch     Status: None   Collection Time: 05/18/23  3:05 PM  Result Value Ref Range   Color, Urine YELLOW YELLOW   APPearance CLEAR CLEAR   Specific Gravity, Urine 1.005 1.005 - 1.030   pH 6.0 5.0 - 8.0   Glucose, UA NEGATIVE NEGATIVE mg/dL   Hgb urine dipstick NEGATIVE NEGATIVE   Bilirubin Urine NEGATIVE NEGATIVE   Ketones, ur NEGATIVE NEGATIVE mg/dL   Protein, ur NEGATIVE NEGATIVE mg/dL   Nitrite NEGATIVE NEGATIVE   Leukocytes,Ua NEGATIVE NEGATIVE    Comment: Performed at [facility], [address] [place], [place] [address]  Lactic acid, plasma     Status: None   Collection Time: 05/18/23  4:49 PM  Result Value Ref Range   Lactic Acid, Venous 1.9 0.5 - 1.9 mmol/L    Comment: Performed at [facility], [address] [place], [place] [address]  Troponin I (High Sensitivity)  Status: None   Collection Time: 05/18/23  4:49 PM  Result Value Ref Range   Troponin I (High Sensitivity) 11 <18 ng/L    Comment: (NOTE) Elevated high sensitivity troponin I (hsTnI) values and significant  changes across serial measurements may suggest ACS but many other  chronic and acute conditions are known to elevate hsTnI results.  Refer to the "Links" section for chest pain algorithms and additional  guidance. Performed at [facility], [address], [place], [place] [address]    DG Chest 2 View Result Date: 05/18/2023 CLINICAL DATA:  Shortness of breath EXAM: CHEST - 2 VIEW COMPARISON:  Chest radiograph dated 02/16/2023 FINDINGS: Low lung  volumes with bronchovascular crowding. Mild diffuse interstitial opacities. Blunting of the costophrenic angles. No pneumothorax. Similar enlarged cardiomediastinal silhouette. No acute osseous abnormality. IMPRESSION: 1. Mild diffuse interstitial opacities, which may represent pulmonary edema. 2. Blunting of the costophrenic angles, which may represent small pleural effusions. 3. Similar cardiomegaly. Electronically Signed   By: Agustin Cree M.D.   On: 05/18/2023 15:39    Pending Labs Unresulted Labs (From admission, onward)     Start     Ordered   05/19/23 0500  Basic metabolic panel  Tomorrow morning,   R        05/18/23 1955   05/19/23 0500  CBC  Tomorrow morning,   R        05/18/23 1955   05/18/23 1439  Blood culture (routine x 2)  BLOOD CULTURE X 2,   R (with STAT occurrences)      05/18/23 1438            Vitals/Pain Today's Vitals   05/18/23 1830 05/18/23 1901 05/18/23 2100 05/18/23 2102  BP: (!) 131/94  (!) 151/105   Pulse:   98   Resp: (!) 25 17 18    Temp:   98.2 F (36.8 C)   TempSrc:      SpO2: 95%  97%   Weight:      Height:      PainSc:    7     Isolation Precautions No active isolations  Medications Medications  apixaban (ELIQUIS) tablet 5 mg (5 mg Oral Given 05/18/23 2055)  carvedilol (COREG) tablet 3.125 mg (3.125 mg Oral Given 05/18/23 1450)  potassium chloride SA (KLOR-CON M) CR tablet 40 mEq (40 mEq Oral Patient Refused/Not Given 05/18/23 1547)  diltiazem (CARDIZEM) 1 mg/mL load via infusion 10 mg (10 mg Intravenous Bolus from Bag 05/18/23 1709)    And  diltiazem (CARDIZEM) 125 mg in dextrose 5% 125 mL (1 mg/mL) infusion (10 mg/hr Intravenous Infusion Verify 05/18/23 1903)  acetaminophen (TYLENOL) tablet 650 mg (has no administration in time range)    Or  acetaminophen (TYLENOL) suppository 650 mg (has no administration in time range)  polyethylene glycol (MIRALAX / GLYCOLAX) packet 17 g (17 g Oral Given 05/18/23 2055)  bisacodyl (DULCOLAX) EC tablet 5 mg (has  no administration in time range)  ondansetron (ZOFRAN) tablet 4 mg (has no administration in time range)    Or  ondansetron (ZOFRAN) injection 4 mg (has no administration in time range)  ipratropium-albuterol (DUONEB) 0.5-2.5 (3) MG/3ML nebulizer solution 3 mL (3 mLs Nebulization Not Given 05/18/23 2101)  HYDROcodone-acetaminophen (NORCO/VICODIN) 5-325 MG per tablet 1 tablet (1 tablet Oral Given 05/18/23 2102)  furosemide (LASIX) injection 60 mg (has no administration in time range)  furosemide (LASIX) injection 40 mg (40 mg Intravenous Given 05/18/23 1449)  diltiazem (CARDIZEM) injection 10 mg (10 mg  Intravenous Given 05/18/23 1446)  vancomycin (VANCOCIN) IVPB 1000 mg/200 mL premix (0 mg Intravenous Stopped 05/18/23 1611)  vancomycin (VANCOCIN) IVPB 1000 mg/200 mL premix (0 mg Intravenous Stopped 05/18/23 1752)    Mobility walks     Focused Assessments Cardiac Assessment Handoff:    Lab Results  Component Value Date   CKTOTAL 27 (L) 07/28/2020   CKMB 0.7 02/21/2008   TROPONINI <0.01        NO INDICATION OF MYOCARDIAL INJURY. 02/21/2008   No results found for: "DDIMER" Does the Patient currently have chest pain? No    R Recommendations: See Admitting Provider Note  Report given to:   Additional Notes: pt is alert and oriented, complaining of shortness of breath that is not helped with albuterol.

## 2023-05-18 NOTE — Plan of Care (Signed)

## 2023-05-18 NOTE — TOC Initial Note (Signed)
 Transition of Care [facility]) - Initial/Assessment Note    Patient Details  Name: Andrew Haley MRN: [id number] Date of Birth: 1966-01-17  [facility]) CM/SW Contact:    Barron Alvine, RN Phone Number: 05/18/2023, 10:34 PM  Clinical Narrative:                 Pt admitted c/A-fib, CHF exacerbation, wound to RLE. A&Ox4, independent, lives alone. States he needs help paying for Eliquis, which is $600 this month. He got samples from his doctor's office to last 2 weeks but is currently out and can't afford prescription. States he doesn't qualify for pharmaceutical co.s indigent program. Pt states he had court-imposed fines which is why he can't afford prescriptions and should be okay financially after this month. UDS positive for cocaine. TOC to follow.   Expected Discharge Plan: Home/Self Care Barriers to Discharge: Continued Medical Work up   Patient Goals and CMS Choice Patient states their goals for this hospitalization and ongoing recovery are:: To breathe better.          Expected Discharge Plan and Services In-house Referral: Clinical Social Work Discharge Planning Services: CM Consult   Living arrangements for the past 2 months: Single Family Home                                      Prior Living Arrangements/Services Living arrangements for the past 2 months: Single Family Home Lives with:: Self Patient language and need for interpreter reviewed:: Yes Do you feel safe going back to the place where you live?: Yes      Need for Family Participation in Patient Care: No (Comment) Care giver support system in place?: No (comment) (Independent. Lives alone. No family in [place].)   Criminal Activity/Legal Involvement Pertinent to Current Situation/Hospitalization: No - Comment as needed  Activities of Daily Living   ADL Screening (condition at time of admission) Independently performs ADLs?: Yes (appropriate for developmental age) Is the patient deaf or have  difficulty hearing?: No Does the patient have difficulty seeing, even when wearing glasses/contacts?: No Does the patient have difficulty concentrating, remembering, or making decisions?: No  Permission Sought/Granted                  Emotional Assessment Appearance:: Appears stated age Attitude/Demeanor/Rapport: Engaged Affect (typically observed): Appropriate Orientation: : Oriented to Self, Oriented to Place, Oriented to  Time, Oriented to Situation Alcohol / Substance Use: Illicit Drugs (UDS positive for cocaine) Psych Involvement: No (comment)  Admission diagnosis:  Atrial fibrillation (HCC) [I48.91] Cellulitis of right lower extremity [L03.115] Atrial fibrillation, unspecified type (HCC) [I48.91] Acute on chronic congestive heart failure, unspecified heart failure type (HCC) [I50.9] Patient Active Problem List   Diagnosis Date Noted   Atrial fibrillation (HCC) 05/18/2023   Acute on chronic congestive heart failure (HCC) 05/18/2023   Cocaine abuse (HCC) 03/10/2023   Chronic systolic heart failure (HCC) 02/18/2023   Sepsis (HCC) 02/16/2023   Adverse effect of levamisole 02/14/2023   Cellulitis 11/11/2022   ERRONEOUS ENCOUNTER--DISREGARD 10/21/2022   Alcohol abuse 09/09/2022   Tachycardia induced cardiomyopathy 07/31/2022   History of recreational drug use 07/30/2022   Persistent atrial fibrillation (HCC) 07/29/2022   Hypokalemia 07/29/2022   Acute HFrEF 07/29/2022   Aneurysm of ascending aorta (HCC) 07/29/2022   Aspiration pneumonia of both lower lobes (HCC) 01/08/2022   History of Alcohol withdrawal seizure with complication 01/08/2022  Alcohol withdrawal syndrome, with delirium (HCC) 01/07/2022   Acute respiratory failure with hypoxia (HCC) 01/02/2022   Status epilepticus (HCC) 01/01/2022   S/P revision of total knee, right 03/04/2021   Medication monitoring encounter 09/10/2020   PICC (peripherally inserted central catheter) removal 09/10/2020   Septic  arthritis (HCC) 07/23/2020   Chronic pain syndrome    Cellulitis of left lower extremity 07/18/2020   Normocytic anemia 07/18/2020   Hypocalcemia 07/18/2020   Essential hypertension 07/18/2020   Class 1 obesity 07/18/2020   Encounter for screening colonoscopy 07/30/2017   Prostate cancer (HCC) 11/29/2015   Postoperative anemia due to acute blood loss 05/25/2014   History of total knee arthroplasty 05/23/2014   H/O total knee replacement 02/22/2014   PCP:  Elfredia Nevins, MD Pharmacy:   [facility] [place] - [address] [place] [address] Phone: [phone] Fax: [phone]     Social Drivers of Health (SDOH) Social History: SDOH Screenings   Food Insecurity: No Food Insecurity (05/18/2023)  Housing: Low Risk  (05/18/2023)  Transportation Needs: No Transportation Needs (05/18/2023)  Utilities: Not At Risk (05/18/2023)  Tobacco Use: Low Risk  (05/18/2023)   SDOH Interventions:     Readmission Risk Interventions    05/18/2023   10:22 PM 02/16/2023    5:33 PM 02/14/2023    3:04 PM  Readmission Risk Prevention Plan  Transportation Screening Complete  Complete  PCP or Specialist Appt within 3-5 Days Complete  Complete  HRI or Home Care Consult Complete  Complete  Social Work Consult for Recovery Care Planning/Counseling Complete  Complete  Palliative Care Screening Not Applicable  Complete  Medication Review Oceanographer) Complete  Complete  HRI or Home Care Consult  Complete   SW Recovery Care/Counseling Consult  Complete   Palliative Care Screening  Not Applicable   Skilled Nursing Facility  Not Complete

## 2023-05-18 NOTE — ED Triage Notes (Addendum)
 Pt arrived via POV with multiple complaints. Pt reports financial issues have prevented him from picking up his medications. Pt reports bilateral leg swelling, SOB, A-fib. Pt denies chest pain. Pt has open sore, oozing fluid on right lower leg.

## 2023-05-19 ENCOUNTER — Inpatient Hospital Stay (HOSPITAL_COMMUNITY)

## 2023-05-19 DIAGNOSIS — I482 Chronic atrial fibrillation, unspecified: Secondary | ICD-10-CM | POA: Diagnosis not present

## 2023-05-19 LAB — CBC
HCT: 32.4 % — ABNORMAL LOW (ref 39.0–52.0)
Hemoglobin: 9.2 g/dL — ABNORMAL LOW (ref 13.0–17.0)
MCH: 20.9 pg — ABNORMAL LOW (ref 26.0–34.0)
MCHC: 28.4 g/dL — ABNORMAL LOW (ref 30.0–36.0)
MCV: 73.6 fL — ABNORMAL LOW (ref 80.0–100.0)
Platelets: 292 10*3/uL (ref 150–400)
RBC: 4.4 MIL/uL (ref 4.22–5.81)
RDW: 17 % — ABNORMAL HIGH (ref 11.5–15.5)
WBC: 6.4 10*3/uL (ref 4.0–10.5)
nRBC: 0 % (ref 0.0–0.2)

## 2023-05-19 LAB — BASIC METABOLIC PANEL WITH GFR
Anion gap: 11 (ref 5–15)
BUN: 17 mg/dL (ref 6–20)
CO2: 32 mmol/L (ref 22–32)
Calcium: 8.5 mg/dL — ABNORMAL LOW (ref 8.9–10.3)
Chloride: 93 mmol/L — ABNORMAL LOW (ref 98–111)
Creatinine, Ser: 0.98 mg/dL (ref 0.61–1.24)
GFR, Estimated: >60 mL/min (ref >60)
Glucose, Bld: 119 mg/dL — ABNORMAL HIGH (ref 70–99)
Potassium: 3.1 mmol/L — ABNORMAL LOW (ref 3.5–5.1)
Sodium: 136 mmol/L (ref 135–145)

## 2023-05-19 LAB — MRSA NEXT GEN BY PCR, NASAL: MRSA by PCR Next Gen: NOT DETECTED

## 2023-05-19 MED ORDER — LEVALBUTEROL HCL 0.63 MG/3ML IN NEBU
0.6300 mg | INHALATION_SOLUTION | Freq: Two times a day (BID) | RESPIRATORY_TRACT | Status: DC
  Administered 2023-05-19 – 2023-05-23 (×9): 0.63 mg via RESPIRATORY_TRACT
  Filled 2023-05-19 (×9): qty 3

## 2023-05-19 MED ORDER — VANCOMYCIN HCL 1500 MG/300ML IV SOLN
1500.0000 mg | Freq: Two times a day (BID) | INTRAVENOUS | Status: DC
  Administered 2023-05-19: 1500 mg via INTRAVENOUS
  Filled 2023-05-19: qty 300

## 2023-05-19 MED ORDER — SODIUM CHLORIDE 0.9 % IV SOLN
3.0000 g | Freq: Four times a day (QID) | INTRAVENOUS | Status: DC
  Administered 2023-05-19 – 2023-05-20 (×6): 3 g via INTRAVENOUS
  Filled 2023-05-19 (×3): qty 8
  Filled 2023-05-19: qty 3
  Filled 2023-05-19 (×4): qty 8

## 2023-05-19 MED ORDER — KETOROLAC TROMETHAMINE 15 MG/ML IJ SOLN
15.0000 mg | Freq: Once | INTRAMUSCULAR | Status: AC
  Administered 2023-05-19: 15 mg via INTRAVENOUS
  Filled 2023-05-19: qty 1

## 2023-05-19 MED ORDER — POTASSIUM CHLORIDE CRYS ER 20 MEQ PO TBCR
40.0000 meq | EXTENDED_RELEASE_TABLET | Freq: Once | ORAL | Status: AC
  Administered 2023-05-19: 40 meq via ORAL
  Filled 2023-05-19: qty 2

## 2023-05-19 NOTE — TOC Progression Note (Signed)
 Transition of Care [facility]) - Progression Note    Patient Details  Name: Andrew Haley MRN: [id number] Date of Birth: [birth year]  Transition of Care [facility]) CM/SW Contact  [place], [place] Phone Number: 05/19/2023, 10:57 AM  Clinical Narrative:    CSW notes per chart review and progression rounds that pt is unable to afford his medications. CSW spoke with MD to update that there are no additional resources for medication assistance. If able MD could attempt to order a less costly medication. However, pt will need to meet deductible for any mediations before his copays become lower.   Expected Discharge Plan: Home/Self Care Barriers to Discharge: Continued Medical Work up  Expected Discharge Plan and Services In-house Referral: Clinical Social Work Discharge Planning Services: CM Consult   Living arrangements for the past 2 months: Single Family Home                                       Social Determinants of Health (SDOH) Interventions SDOH Screenings   Food Insecurity: No Food Insecurity (05/18/2023)  Housing: Low Risk  (05/18/2023)  Transportation Needs: No Transportation Needs (05/18/2023)  Utilities: Not At Risk (05/18/2023)  Tobacco Use: Low Risk  (05/18/2023)    Readmission Risk Interventions    05/18/2023   10:22 PM 02/16/2023    5:33 PM 02/14/2023    3:04 PM  Readmission Risk Prevention Plan  Transportation Screening Complete  Complete  PCP or Specialist Appt within 3-5 Days Complete  Complete  HRI or Home Care Consult Complete  Complete  Social Work Consult for Recovery Care Planning/Counseling Complete  Complete  Palliative Care Screening Not Applicable  Complete  Medication Review Oceanographer) Complete  Complete  HRI or Home Care Consult  Complete   SW Recovery Care/Counseling Consult  Complete   Palliative Care Screening  Not Applicable   Skilled Nursing Facility  Not Complete

## 2023-05-19 NOTE — Progress Notes (Signed)
 MD notified of pt having increased BLE pain. Norco given not effective. Pt having increased agitation. Positive for cocaine on admission and drinks 5-6 beers a day. No ciwa ordered. MD ordered Toradol. Awaiting for any additional orders

## 2023-05-19 NOTE — Progress Notes (Signed)
 PROGRESS NOTE    Andrew Haley  RUE:[id number] DOB: March 29, 1965 DOA: 05/18/2023 PCP: Elfredia Nevins, MD   Brief Narrative:    Andrew Haley is a 58 y.o. male with a PMH significant for Afib, CHF, alcohol use, cocaine use, anemia, prostate cancer, HTN, obesity, cardiomyopathy. At baseline, they live at home and are independent with ADLs.   They presented from home to the ED on 05/18/2023 with R leg pain x several days.  He was admitted for right lower extremity cellulitis as well as suspected abscess along with acute on chronic HFrEF and A-fib with RVR due to medication nonadherence.  Heart rates are better controlled and he may transfer to telemetry.  Continue IV antibiotics as ordered with no growth on blood cultures noted.   Assessment & Plan:   Principal Problem:   Atrial fibrillation (HCC) Active Problems:   Acute on chronic congestive heart failure (HCC)  Assessment and Plan:   RLE cellulitis  abscess s/p I&D- spontaneously draining.  - [place] with no findings of abscess or foreign material - s/p vancomycin. Will continue on unasyn for now.  MRSA PCR negative.  Patient has several antibiotic allergies - monitor for fever - f/u blood cultures   Acute on chronic HFrEF- due to medication non-adherence. Pulmonary edema on cxray and rales on exam. Significant LE edema. Already had >3L UOP since lasix given. EF 25-30%. BNP 682 - TOC consult for medication assistance.  - daily weights - continue IV lasix - strict I/O - no echo considering this is result of medication nonadherence and just had one 12/24  Hypokalemia -Replete and reevaluate in a.m.   Afib RVR- HR up to 130s on presentation. Improved on diltiazem gtt. - continue dilt gtt and titrate off CCB when able - continue home eliquis, carvedilol at higher dose   HTN - continue home losartan, spironolactone  Obesity, class I -BMI 33.37    DVT prophylaxis:apixaban Code Status: Full Family Communication: None at  bedside. Disposition Plan:  Status is: Inpatient Remains inpatient appropriate because: Need for IV medications.   Consultants:  None  Procedures:  None  Antimicrobials:  Anti-infectives (From admission, onward)    Start     Dose/Rate Route Frequency Ordered Stop   05/19/23 0600  vancomycin (VANCOREADY) IVPB 1500 mg/300 mL        1,500 mg 150 mL/hr over 120 Minutes Intravenous Every 12 hours 05/19/23 0232     05/19/23 0400  Ampicillin-Sulbactam (UNASYN) 3 g in sodium chloride 0.9 % 100 mL IVPB        3 g 200 mL/hr over 30 Minutes Intravenous Every 6 hours 05/19/23 0228     05/18/23 1600  vancomycin (VANCOCIN) IVPB 1000 mg/200 mL premix        1,000 mg 200 mL/hr over 60 Minutes Intravenous  Once 05/18/23 1500 05/18/23 1752   05/18/23 1445  vancomycin (VANCOCIN) IVPB 1000 mg/200 mL premix        1,000 mg 200 mL/hr over 60 Minutes Intravenous  Once 05/18/23 1444 05/18/23 1611      Subjective: Patient seen and evaluated today with no new acute complaints or concerns. No acute concerns or events noted overnight.  Heart rates are improved.  Erythema to right lower extremity appears to be improving as well.  Objective: Vitals:   05/19/23 0445 05/19/23 0450 05/19/23 0455 05/19/23 0500  BP: (!) 141/86   (!) 144/97  Pulse: 91 89 93 88  Resp: (!) 22 (!) 22 (!) 22 (!) 23  Temp:      TempSrc:      SpO2: 97% 97% 96% 95%  Weight:      Height:        Intake/Output Summary (Last 24 hours) at 05/19/2023 0707 Last data filed at 05/19/2023 0509 Gross per 24 hour  Intake 784.97 ml  Output 5200 ml  Net -4415.03 ml   Filed Weights   05/18/23 1354 05/18/23 2150 05/19/23 0403  Weight: 120.2 kg 120.4 kg 121.1 kg    Examination:  General exam: Appears calm and comfortable  Respiratory system: Clear to auscultation. Respiratory effort normal. Cardiovascular system: S1 & S2 heard, irregular Gastrointestinal system: Abdomen is soft Central nervous system: Alert and awake Extremities:  Bilateral erythema with wounds noted right greater than left Skin: No significant lesions noted Psychiatry: Flat affect.    Data Reviewed: I have personally reviewed following labs and imaging studies  CBC: Recent Labs  Lab 05/18/23 1417 05/19/23 0506  WBC 6.1 6.4  NEUTROABS 4.7  --   HGB 9.8* 9.2*  HCT 34.5* 32.4*  MCV 76.0* 73.6*  PLT 331 292   Basic Metabolic Panel: Recent Labs  Lab 05/18/23 1417 05/19/23 0506  NA 129* 136  K 3.4* 3.1*  CL 87* 93*  CO2 28 32  GLUCOSE 148* 119*  BUN 15 17  CREATININE 1.09 0.98  CALCIUM 8.9 8.5*   GFR: Estimated Creatinine Clearance: 113.8 mL/min (by C-G formula based on SCr of 0.98 mg/dL). Liver Function Tests: Recent Labs  Lab 05/18/23 1417  AST 76*  ALT 74*  ALKPHOS 102  BILITOT 1.9*  PROT 7.2  ALBUMIN 3.6   Recent Labs  Lab 05/18/23 1417  LIPASE 41   No results for input(s): "AMMONIA" in the last 168 hours. Coagulation Profile: No results for input(s): "INR", "PROTIME" in the last 168 hours. Cardiac Enzymes: No results for input(s): "CKTOTAL", "CKMB", "CKMBINDEX", "TROPONINI" in the last 168 hours. BNP (last 3 results) No results for input(s): "PROBNP" in the last 8760 hours. HbA1C: No results for input(s): "HGBA1C" in the last 72 hours. CBG: No results for input(s): "GLUCAP" in the last 168 hours. Lipid Profile: No results for input(s): "CHOL", "HDL", "LDLCALC", "TRIG", "CHOLHDL", "LDLDIRECT" in the last 72 hours. Thyroid Function Tests: No results for input(s): "TSH", "T4TOTAL", "FREET4", "T3FREE", "THYROIDAB" in the last 72 hours. Anemia Panel: No results for input(s): "VITAMINB12", "FOLATE", "FERRITIN", "TIBC", "IRON", "RETICCTPCT" in the last 72 hours. Sepsis Labs: Recent Labs  Lab 05/18/23 1454 05/18/23 1649  LATICACIDVEN 1.8 1.9    Recent Results (from the past 240 hours)  Blood culture (routine x 2)     Status: None (Preliminary result)   Collection Time: 05/18/23  2:54 PM   Specimen: BLOOD   Result Value Ref Range Status   Specimen Description BLOOD BLOOD RIGHT HAND  Final   Special Requests   Final    BOTTLES DRAWN AEROBIC AND ANAEROBIC Blood Culture adequate volume   Culture   Final    NO GROWTH < 24 HOURS Performed at [facility], [address], [place], [place] [address]    Report Status PENDING  Incomplete  Blood culture (routine x 2)     Status: None (Preliminary result)   Collection Time: 05/18/23  2:56 PM   Specimen: BLOOD  Result Value Ref Range Status   Specimen Description BLOOD RIGHT HAND  Final   Special Requests   Final    BOTTLES DRAWN AEROBIC AND ANAEROBIC Blood Culture adequate volume   Culture  Final    NO GROWTH < 24 HOURS Performed at [facility], [address], [place], [place] [address]    Report Status PENDING  Incomplete  MRSA Next Gen by PCR, Nasal     Status: None   Collection Time: 05/18/23  9:46 PM   Specimen: Nasal Mucosa; Nasal Swab  Result Value Ref Range Status   MRSA by PCR Next Gen NOT DETECTED NOT DETECTED Final    Comment: (NOTE) The GeneXpert MRSA Assay (FDA approved for NASAL specimens only), is one component of a comprehensive MRSA colonization surveillance program. It is not intended to diagnose MRSA infection nor to guide or monitor treatment for MRSA infections. Test performance is not FDA approved in patients less than 67 years old. Performed at [facility], [address], [place], [place] [address]          Radiology Studies: DG Chest 2 View Result Date: 05/18/2023 CLINICAL DATA:  Shortness of breath EXAM: CHEST - 2 VIEW COMPARISON:  Chest radiograph dated 02/16/2023 FINDINGS: Low lung volumes with bronchovascular crowding. Mild diffuse interstitial opacities. Blunting of the costophrenic angles. No pneumothorax. Similar enlarged cardiomediastinal silhouette. No acute osseous abnormality. IMPRESSION: 1. Mild diffuse interstitial opacities, which may represent pulmonary edema. 2. Blunting of the costophrenic  angles, which may represent small pleural effusions. 3. Similar cardiomegaly. Electronically Signed   By: Agustin Cree M.D.   On: 05/18/2023 15:39        Scheduled Meds:  apixaban  5 mg Oral BID   budesonide (PULMICORT) nebulizer solution  0.5 mg Nebulization BID   carvedilol  6.25 mg Oral BID WC   Chlorhexidine Gluconate Cloth  6 each Topical Q0600   furosemide  60 mg Intravenous Q12H   levalbuterol  0.63 mg Nebulization BID   losartan  25 mg Oral Daily   polyethylene glycol  17 g Oral Daily   potassium chloride  40 mEq Oral Once   Continuous Infusions:  ampicillin-sulbactam (UNASYN) IV 3 g (05/19/23 0423)   diltiazem (CARDIZEM) infusion 10 mg/hr (05/19/23 0329)   vancomycin 1,500 mg (05/19/23 0509)     LOS: 1 day    Time spent: 55 minutes    Tyrina Hines D Sherryll Burger, DO [facility]  If 7PM-7AM, please contact night-coverage [email or web address] 05/19/2023, 7:07 AM

## 2023-05-19 NOTE — Progress Notes (Signed)
 Pharmacy Antibiotic Note  Andrew Haley is a 58 y.o. male admitted on 05/18/2023 with cellulitis.  Pharmacy has been consulted for Vancomycin  dosing.  Vancomycin 2 g IV given in ED at  3 pm 4/7  Plan: Vancomycin 1500 mg IV q12h  Height: 6\' 3"  (190.5 cm) Weight: 120.4 kg (265 lb 6.9 oz) IBW/kg (Calculated) : 84.5  Temp (24hrs), Avg:97.9 F (36.6 C), Min:97.6 F (36.4 C), Max:98.2 F (36.8 C)  Recent Labs  Lab 05/18/23 1417 05/18/23 1454 05/18/23 1649  WBC 6.1  --   --   CREATININE 1.09  --   --   LATICACIDVEN  --  1.8 1.9    Estimated Creatinine Clearance: 102.1 mL/min (by C-G formula based on SCr of 1.09 mg/dL).    Allergies  Allergen Reactions   Ertapenem Hives   Cefazolin Hives   Ceftriaxone Other (See Comments)    Itching, sweating and anxiety with ceftriaxone .     Eddie Candle 05/19/2023 2:29 AM

## 2023-05-19 NOTE — Plan of Care (Signed)

## 2023-05-20 DIAGNOSIS — I5023 Acute on chronic systolic (congestive) heart failure: Secondary | ICD-10-CM | POA: Diagnosis not present

## 2023-05-20 DIAGNOSIS — I482 Chronic atrial fibrillation, unspecified: Secondary | ICD-10-CM | POA: Diagnosis not present

## 2023-05-20 LAB — BASIC METABOLIC PANEL WITH GFR
Anion gap: 8 (ref 5–15)
BUN: 20 mg/dL (ref 6–20)
CO2: 33 mmol/L — ABNORMAL HIGH (ref 22–32)
Calcium: 8.5 mg/dL — ABNORMAL LOW (ref 8.9–10.3)
Chloride: 94 mmol/L — ABNORMAL LOW (ref 98–111)
Creatinine, Ser: 1.03 mg/dL (ref 0.61–1.24)
GFR, Estimated: >60 mL/min (ref >60)
Glucose, Bld: 121 mg/dL — ABNORMAL HIGH (ref 70–99)
Potassium: 3.7 mmol/L (ref 3.5–5.1)
Sodium: 135 mmol/L (ref 135–145)

## 2023-05-20 LAB — CBC
HCT: 34.8 % — ABNORMAL LOW (ref 39.0–52.0)
Hemoglobin: 9.9 g/dL — ABNORMAL LOW (ref 13.0–17.0)
MCH: 21.3 pg — ABNORMAL LOW (ref 26.0–34.0)
MCHC: 28.4 g/dL — ABNORMAL LOW (ref 30.0–36.0)
MCV: 74.8 fL — ABNORMAL LOW (ref 80.0–100.0)
Platelets: 307 10*3/uL (ref 150–400)
RBC: 4.65 MIL/uL (ref 4.22–5.81)
RDW: 17.2 % — ABNORMAL HIGH (ref 11.5–15.5)
WBC: 8.2 10*3/uL (ref 4.0–10.5)
nRBC: 0 % (ref 0.0–0.2)

## 2023-05-20 LAB — COMPREHENSIVE METABOLIC PANEL WITH GFR
ALT: 58 U/L — ABNORMAL HIGH (ref 0–44)
AST: 49 U/L — ABNORMAL HIGH (ref 15–41)
Albumin: 3.3 g/dL — ABNORMAL LOW (ref 3.5–5.0)
Alkaline Phosphatase: 109 U/L (ref 38–126)
Anion gap: 12 (ref 5–15)
BUN: 19 mg/dL (ref 6–20)
CO2: 31 mmol/L (ref 22–32)
Calcium: 8.5 mg/dL — ABNORMAL LOW (ref 8.9–10.3)
Chloride: 93 mmol/L — ABNORMAL LOW (ref 98–111)
Creatinine, Ser: 1.1 mg/dL (ref 0.61–1.24)
GFR, Estimated: >60 mL/min
Glucose, Bld: 121 mg/dL — ABNORMAL HIGH (ref 70–99)
Potassium: 3.6 mmol/L (ref 3.5–5.1)
Sodium: 136 mmol/L (ref 135–145)
Total Bilirubin: 1.2 mg/dL (ref 0.0–1.2)
Total Protein: 6.9 g/dL (ref 6.5–8.1)

## 2023-05-20 LAB — MAGNESIUM
Magnesium: 1.9 mg/dL (ref 1.7–2.4)
Magnesium: 1.9 mg/dL (ref 1.7–2.4)

## 2023-05-20 MED ORDER — AMOXICILLIN-POT CLAVULANATE 875-125 MG PO TABS: 1.0000 | ORAL_TABLET | Freq: Two times a day (BID) | ORAL | Status: DC

## 2023-05-20 MED ORDER — CARVEDILOL 3.125 MG PO TABS
9.3750 mg | ORAL_TABLET | Freq: Two times a day (BID) | ORAL | Status: DC
  Administered 2023-05-20 – 2023-05-21 (×2): 9.375 mg via ORAL
  Filled 2023-05-20 (×2): qty 3

## 2023-05-20 MED ORDER — DOXYCYCLINE HYCLATE 100 MG PO TABS
100.0000 mg | ORAL_TABLET | Freq: Two times a day (BID) | ORAL | Status: DC
Start: 2023-05-20 — End: 2023-05-23
  Administered 2023-05-20 – 2023-05-23 (×6): 100 mg via ORAL
  Filled 2023-05-20 (×6): qty 1

## 2023-05-20 MED ORDER — POTASSIUM CHLORIDE CRYS ER 20 MEQ PO TBCR
40.0000 meq | EXTENDED_RELEASE_TABLET | Freq: Once | ORAL | Status: AC
  Administered 2023-05-20: 40 meq via ORAL
  Filled 2023-05-20: qty 2

## 2023-05-20 NOTE — Hospital Course (Signed)
 58 y.o. male with a PMH significant for Afib, CHF, alcohol use, h/o cocaine use, anemia, prostate cancer, HTN, obesity, cardiomyopathy. At baseline, they live at home and are independent with ADLs.   They presented from home to the ED on 05/18/2023 with R leg pain x several days.  He was admitted for right lower extremity cellulitis as well as suspected abscess along with acute on chronic HFrEF and A-fib with RVR due to medication nonadherence.  He had been out of medication for about 2 weeks.  He claims he cannot afford the high cost of apixaban due to his insurance deductible not being met.  He had been receiving samples from cardiology office but when those ran out he was out of his medication completely.

## 2023-05-20 NOTE — Progress Notes (Signed)
 PROGRESS NOTE   Andrew Haley  ZOX:[id number] DOB: Jun 13, 1965 DOA: 05/18/2023 PCP: Elfredia Nevins, MD   Chief Complaint  Patient presents with   Shortness of Breath   Level of care: Telemetry  Brief Admission History:  58 y.o. male with a PMH significant for Afib, CHF, alcohol use, h/o cocaine use, anemia, prostate cancer, HTN, obesity, cardiomyopathy. At baseline, they live at home and are independent with ADLs.   They presented from home to the ED on 05/18/2023 with R leg pain x several days.  He was admitted for right lower extremity cellulitis as well as suspected abscess along with acute on chronic HFrEF and A-fib with RVR due to medication nonadherence.  He had been out of medication for about 2 weeks.  He claims he cannot afford the high cost of apixaban due to his insurance deductible not being met.  He had been receiving samples from cardiology office but when those ran out he was out of his medication completely.    Assessment and Plan:  RLE cellulitis  abscess s/p I&D- spontaneously draining.  - [place] with no findings of abscess or foreign material - s/p vancomycin. He was treated with unasyn.  MRSA PCR negative.  Patient has several antibiotic allergies - f/u blood cultures- no growth to date  - transition to oral antibiotic    Acute on chronic HFrEF- due to medication non-adherence. Pulmonary edema on cxray and rales on exam. Significant LE edema. Already had >3L UOP since lasix given. EF 25-30%. BNP 682 - TOC consulted for medication assistance but they report unable to provide assistance as patient already has medical insurance.    - daily weights - continue IV lasix - strict I/O - echo not repeated since he had one recently 12/24    Hypokalemia -Repleted and reevaluate in a.m.   Afib RVR- HR up to 130s on presentation. Improved on diltiazem gtt. - he was briefly on IV diltiazem but now back to home carvedilol - continue home eliquis for now but will discuss with him  transition to warfarin - given his noncompliance I worry that warfarin use is going to be difficult to manage if he is not following up as required; will counsel with him further tomorrow - given persistent tachycardic rates, increased carvedilol dose to 9.375 mg BID with hold parameters  Tachycardia mediated cardiomyopathy - he has been unable to have a cardioversion due to recreational substance use (cocaine)   HTN - continue home losartan, spironolactone   Obesity, class I -BMI 33.37   DVT prophylaxis: apixaban Code Status: Full  Family Communication:  Disposition: anticipate home    Consultants:   Procedures:   Antimicrobials:    Subjective: Pt is eager to discharge home.  His heart rate is elevated and not fully controlled today.  He says he cannot afford apixaban right now, "just give me til the first of the month" he says.   Objective: Vitals:   05/20/23 0311 05/20/23 0812 05/20/23 0813 05/20/23 1344  BP:    122/76  Pulse:    100  Resp:    20  Temp:    97.6 F (36.4 C)  TempSrc:    Oral  SpO2:  95% 98% 99%  Weight: 121.4 kg     Height:        Intake/Output Summary (Last 24 hours) at 05/20/2023 1546 Last data filed at 05/20/2023 1500 Gross per 24 hour  Intake --  Output 3200 ml  Net -3200 ml   Ceasar Mons  Weights   05/18/23 2150 05/19/23 0403 05/20/23 0311  Weight: 120.4 kg 121.1 kg 121.4 kg   Examination:  General exam: Appears calm and comfortable  Respiratory system: Clear to auscultation. Respiratory effort normal. Cardiovascular system: normal S1 & S2 heard. No JVD, murmurs, rubs, gallops or clicks. No pedal edema. Gastrointestinal system: Abdomen is nondistended, soft and nontender. No organomegaly or masses felt. Normal bowel sounds heard. Central nervous system: Alert and oriented. No focal neurological deficits. Extremities: Symmetric 5 x 5 power. Skin: No rashes, lesions or ulcers. Psychiatry: Judgement and insight appear normal. Mood & affect  appropriate.   Data Reviewed: I have personally reviewed following labs and imaging studies  CBC: Recent Labs  Lab 05/18/23 1417 05/19/23 0506 05/20/23 0032  WBC 6.1 6.4 8.2  NEUTROABS 4.7  --   --   HGB 9.8* 9.2* 9.9*  HCT 34.5* 32.4* 34.8*  MCV 76.0* 73.6* 74.8*  PLT 331 292 307    Basic Metabolic Panel: Recent Labs  Lab 05/18/23 1417 05/19/23 0506 05/20/23 0032 05/20/23 0617  NA 129* 136 136 135  K 3.4* 3.1* 3.6 3.7  CL 87* 93* 93* 94*  CO2 28 32 31 33*  GLUCOSE 148* 119* 121* 121*  BUN 15 17 19 20   CREATININE 1.09 0.98 1.10 1.03  CALCIUM 8.9 8.5* 8.5* 8.5*  MG  --   --  1.9 1.9    CBG: No results for input(s): "GLUCAP" in the last 168 hours.  Recent Results (from the past 240 hours)  Blood culture (routine x 2)     Status: None (Preliminary result)   Collection Time: 05/18/23  2:54 PM   Specimen: BLOOD  Result Value Ref Range Status   Specimen Description BLOOD BLOOD RIGHT HAND  Final   Special Requests   Final    BOTTLES DRAWN AEROBIC AND ANAEROBIC Blood Culture adequate volume   Culture   Final    NO GROWTH 2 DAYS Performed at [facility], [address] [place], [place] [address]    Report Status PENDING  Incomplete  Blood culture (routine x 2)     Status: None (Preliminary result)   Collection Time: 05/18/23  2:56 PM   Specimen: BLOOD  Result Value Ref Range Status   Specimen Description BLOOD RIGHT HAND  Final   Special Requests   Final    BOTTLES DRAWN AEROBIC AND ANAEROBIC Blood Culture adequate volume   Culture   Final    NO GROWTH 2 DAYS Performed at [facility], [address], [place], [place] [address]    Report Status PENDING  Incomplete  MRSA Next Gen by PCR, Nasal     Status: None   Collection Time: 05/18/23  9:46 PM   Specimen: Nasal Mucosa; Nasal Swab  Result Value Ref Range Status   MRSA by PCR Next Gen NOT DETECTED NOT DETECTED Final    Comment: (NOTE) The GeneXpert MRSA Assay (FDA approved for NASAL specimens only), is  one component of a comprehensive MRSA colonization surveillance program. It is not intended to diagnose MRSA infection nor to guide or monitor treatment for MRSA infections. Test performance is not FDA approved in patients less than 67 years old. Performed at [facility], [address] [place], [place] [address]      Radiology Studies: [place] RT LOWER EXTREM LTD SOFT TISSUE NON VASCULAR Result Date: 05/19/2023 CLINICAL DATA:  Right lower leg cellulitis, status post I and D. Evaluate for residual abscess. EXAM: ULTRASOUND RIGHT LOWER EXTREMITY LIMITED TECHNIQUE:  Ultrasound examination of the lower extremity soft tissues was performed in the area of clinical concern. COMPARISON:  None available FINDINGS: Targeted sonographic evaluation of the anterior right lower leg demonstrates diffuse soft tissue thickening and edema consistent with cellulitis. No organized abscess is identified. IMPRESSION: Diffuse inflammatory changes of the right anterior lower leg soft tissues without organized abscess. Electronically Signed   By: Acquanetta Belling M.D.   On: 05/19/2023 10:17    Scheduled Meds:  apixaban  5 mg Oral BID   budesonide (PULMICORT) nebulizer solution  0.5 mg Nebulization BID   carvedilol  9.375 mg Oral BID WC   Chlorhexidine Gluconate Cloth  6 each Topical Q0600   furosemide  60 mg Intravenous Q12H   levalbuterol  0.63 mg Nebulization BID   losartan  25 mg Oral Daily   polyethylene glycol  17 g Oral Daily   Continuous Infusions:  ampicillin-sulbactam (UNASYN) IV 3 g (05/20/23 0920)   diltiazem (CARDIZEM) infusion Stopped (05/19/23 1050)     LOS: 2 days   Time spent: 57 mins  Humbert Morozov Laural Benes, MD How to contact the [facility] Attending or Consulting provider 7A - 7P or covering provider during after hours 7P -7A, for this patient?  Check the care team in [facility] and look for a) attending/consulting [facility] provider listed and b) the [facility] team listed Log into [email or web address] to find provider on call.  Locate  the [facility] provider you are looking for under [facility] and page to a number that you can be directly reached. If you still have difficulty reaching the provider, please page the [facility] (Director on Call) for the Hospitalists listed on amion for assistance.  05/20/2023, 3:46 PM

## 2023-05-20 NOTE — Progress Notes (Signed)
   05/19/23 1938  Assess: MEWS Score  Temp 98.1 F (36.7 C)  BP 130/75  MAP (mmHg) 90  Pulse Rate (!) 118  SpO2 99 %  O2 Device Room Air  Assess: MEWS Score  MEWS Temp 0  MEWS Systolic 0  MEWS Pulse 2  MEWS RR 0  MEWS LOC 0  MEWS Score 2  MEWS Score Color Yellow  Assess: if the MEWS score is Yellow or Red  Were vital signs accurate and taken at a resting state? Yes  Does the patient meet 2 or more of the SIRS criteria? No  MEWS guidelines implemented  Yes, yellow  Treat  MEWS Interventions Considered administering scheduled or prn medications/treatments as ordered  Take Vital Signs  Increase Vital Sign Frequency  Yellow: Q2hr x1, continue Q4hrs until patient remains green for 12hrs  Escalate  MEWS: Escalate Yellow: Discuss with charge nurse and consider notifying provider and/or RRT  Notify: Charge Nurse/RN  Name of Charge Nurse/RN Notified no charge on unit. Primary RN on unit  Provider Notification  Provider Name/Title Thomes Dinning  Date Provider Notified 05/19/23  Time Provider Notified 2256  Notification Reason Other (Comment) (agitation. pain. mews)  Provider response See new orders  Date of Provider Response 05/19/23  Time of Provider Response 2300  Assess: SIRS CRITERIA  SIRS Temperature  0  SIRS Respirations  0  SIRS Pulse 1  SIRS WBC 0  SIRS Score Sum  1

## 2023-05-20 NOTE — Plan of Care (Signed)
  Problem: Coping: Goal: Level of anxiety will decrease Outcome: Not Progressing   Problem: Pain Managment: Goal: General experience of comfort will improve and/or be controlled Outcome: Not Progressing   Problem: Education: Goal: Knowledge of General Education information will improve Description: Including pain rating scale, medication(s)/side effects and non-pharmacologic comfort measures Outcome: Progressing   Problem: Health Behavior/Discharge Planning: Goal: Ability to manage health-related needs will improve Outcome: Progressing   Problem: Clinical Measurements: Goal: Ability to maintain clinical measurements within normal limits will improve Outcome: Progressing Goal: Will remain free from infection Outcome: Progressing Goal: Diagnostic test results will improve Outcome: Progressing Goal: Respiratory complications will improve Outcome: Progressing Goal: Cardiovascular complication will be avoided Outcome: Progressing   Problem: Activity: Goal: Risk for activity intolerance will decrease Outcome: Progressing   Problem: Nutrition: Goal: Adequate nutrition will be maintained Outcome: Progressing   Problem: Elimination: Goal: Will not experience complications related to bowel motility Outcome: Progressing Goal: Will not experience complications related to urinary retention Outcome: Progressing   Problem: Safety: Goal: Ability to remain free from injury will improve Outcome: Progressing   Problem: Skin Integrity: Goal: Risk for impaired skin integrity will decrease Outcome: Progressing

## 2023-05-20 NOTE — Plan of Care (Signed)
?  Problem: Education: ?Goal: Knowledge of General Education information will improve ?Description: Including pain rating scale, medication(s)/side effects and non-pharmacologic comfort measures ?Outcome: Progressing ?  ?Problem: Health Behavior/Discharge Planning: ?Goal: Ability to manage health-related needs will improve ?Outcome: Progressing ?  ?Problem: Nutrition: ?Goal: Adequate nutrition will be maintained ?Outcome: Progressing ?  ?Problem: Activity: ?Goal: Risk for activity intolerance will decrease ?Outcome: Not Progressing ?  ?

## 2023-05-21 ENCOUNTER — Telehealth: Payer: Self-pay | Admitting: Internal Medicine

## 2023-05-21 DIAGNOSIS — I4819 Other persistent atrial fibrillation: Secondary | ICD-10-CM

## 2023-05-21 DIAGNOSIS — I482 Chronic atrial fibrillation, unspecified: Secondary | ICD-10-CM | POA: Diagnosis not present

## 2023-05-21 DIAGNOSIS — I5020 Unspecified systolic (congestive) heart failure: Secondary | ICD-10-CM

## 2023-05-21 DIAGNOSIS — I5023 Acute on chronic systolic (congestive) heart failure: Secondary | ICD-10-CM | POA: Diagnosis not present

## 2023-05-21 MED ORDER — LOSARTAN POTASSIUM 25 MG PO TABS: 12.5000 mg | ORAL_TABLET | Freq: Every day | ORAL | Status: DC

## 2023-05-21 MED ORDER — CARVEDILOL 12.5 MG PO TABS
12.5000 mg | ORAL_TABLET | Freq: Two times a day (BID) | ORAL | Status: DC
  Administered 2023-05-21 – 2023-05-22 (×2): 12.5 mg via ORAL
  Filled 2023-05-21 (×2): qty 1

## 2023-05-21 NOTE — Plan of Care (Signed)
   Problem: Education: Goal: Knowledge of General Education information will improve Description Including pain rating scale, medication(s)/side effects and non-pharmacologic comfort measures Outcome: Progressing   Problem: Health Behavior/Discharge Planning: Goal: Ability to manage health-related needs will improve Outcome: Progressing

## 2023-05-21 NOTE — Progress Notes (Signed)
 PROGRESS NOTE   Andrew Haley  JYN:[id number] DOB: 1965-12-17 DOA: 05/18/2023 PCP: Elfredia Nevins, MD   Chief Complaint  Patient presents with   Shortness of Breath   Level of care: Telemetry  Brief Admission History:  58 y.o. male with a PMH significant for Afib, CHF, alcohol use, h/o cocaine use, anemia, prostate cancer, HTN, obesity, cardiomyopathy. At baseline, they live at home and are independent with ADLs.   They presented from home to the ED on 05/18/2023 with R leg pain x several days.  He was admitted for right lower extremity cellulitis as well as suspected abscess along with acute on chronic HFrEF and A-fib with RVR due to medication nonadherence.  He had been out of medication for about 2 weeks.  He claims he cannot afford the high cost of apixaban due to his insurance deductible not being met.  He had been receiving samples from cardiology office but when those ran out he was out of his medication completely.    Assessment and Plan:  RLE cellulitis  abscess s/p I&D- spontaneously draining. IMPROVED  - [place] with no findings of abscess or foreign material - s/p vancomycin. He was treated with IV unasyn.  MRSA PCR negative.  Patient has several antibiotic allergies - f/u blood cultures- no growth to date  - transitioned to oral antibiotic (doxycycline) 05/20/23   Acute on chronic HFrEF- due to medication non-adherence. Pulmonary edema on cxray and rales on exam. Significant LE edema. Already had >3L UOP since lasix given. EF 25-30%. BNP 682 - TOC consulted for medication assistance but they report unable to provide assistance as patient already has medical insurance.    - daily weights - continue IV lasix - strict I/O - echo not repeated since he had one recently 12/24    Intake/Output Summary (Last 24 hours) at 05/21/2023 0909 Last data filed at 05/21/2023 0843 Gross per 24 hour  Intake 4431.8 ml  Output 6000 ml  Net -1568.2 ml   Filed Weights   05/19/23 0403 05/20/23 0311  05/21/23 0338  Weight: 121.1 kg 121.4 kg 124.2 kg   Hypokalemia -Repleted and reevaluate in a.m.   Afib RVR- HR up to 130s on presentation. Improved on diltiazem gtt. - he was briefly on IV diltiazem but now back to home carvedilol - continue home eliquis for now but will discuss with him transition to warfarin - given his noncompliance I worry that warfarin use is going to be difficult to manage if he is not following up as required; will counsel with him further tomorrow and check with pharm D and social worker - given persistent tachycardic rates, increased carvedilol dose to 9.375 mg BID with hold parameters, rates still suboptimally controlled; I have asked for cardiology to see him before he goes home to see if we can get his HR better controlled  Tachycardia mediated cardiomyopathy - he has been unable to have a cardioversion due to recreational substance use (cocaine) but he reports now he is "clean"    HTN - continue home carvedilol, losartan, spironolactone   Obesity, class I -BMI 33.37   DVT prophylaxis: apixaban Code Status: Full  Family Communication:  Disposition: anticipate home when medically cleared   Consultants:  cardiology  Procedures:   Antimicrobials:    Subjective: Pt is eager to go home.  Says he is "clean" from recreational drugs.   Objective: Vitals:   05/20/23 2023 05/21/23 0338 05/21/23 0836 05/21/23 0838  BP:  122/89  131/81  Pulse:  100  (!) 112  Resp:    (!) 22  Temp:  98 F (36.7 C)  97.6 F (36.4 C)  TempSrc:  Oral  Axillary  SpO2: 96% 96% 99% 100%  Weight:  124.2 kg    Height:        Intake/Output Summary (Last 24 hours) at 05/21/2023 0907 Last data filed at 05/21/2023 0843 Gross per 24 hour  Intake 4431.8 ml  Output 6000 ml  Net -1568.2 ml   Filed Weights   05/19/23 0403 05/20/23 0311 05/21/23 0338  Weight: 121.1 kg 121.4 kg 124.2 kg   Examination:  General exam: Appears calm and comfortable  Respiratory system: Clear  to auscultation. Respiratory effort normal. Cardiovascular system: irregularly irregular and tachycardic, normal S1 & S2 heard. No JVD, murmurs, rubs, gallops or clicks. No pedal edema. Gastrointestinal system: Abdomen is nondistended, soft and nontender. No organomegaly or masses felt. Normal bowel sounds heard. Central nervous system: Alert and oriented. No focal neurological deficits. Extremities: Symmetric 5 x 5 power. Skin: No rashes, lesions or ulcers. Psychiatry: Judgement and insight appear normal. Mood & affect appropriate.   Data Reviewed: I have personally reviewed following labs and imaging studies  CBC: Recent Labs  Lab 05/18/23 1417 05/19/23 0506 05/20/23 0032  WBC 6.1 6.4 8.2  NEUTROABS 4.7  --   --   HGB 9.8* 9.2* 9.9*  HCT 34.5* 32.4* 34.8*  MCV 76.0* 73.6* 74.8*  PLT 331 292 307    Basic Metabolic Panel: Recent Labs  Lab 05/18/23 1417 05/19/23 0506 05/20/23 0032 05/20/23 0617  NA 129* 136 136 135  K 3.4* 3.1* 3.6 3.7  CL 87* 93* 93* 94*  CO2 28 32 31 33*  GLUCOSE 148* 119* 121* 121*  BUN 15 17 19 20   CREATININE 1.09 0.98 1.10 1.03  CALCIUM 8.9 8.5* 8.5* 8.5*  MG  --   --  1.9 1.9    CBG: No results for input(s): "GLUCAP" in the last 168 hours.  Recent Results (from the past 240 hours)  Blood culture (routine x 2)     Status: None (Preliminary result)   Collection Time: 05/18/23  2:54 PM   Specimen: BLOOD  Result Value Ref Range Status   Specimen Description BLOOD BLOOD RIGHT HAND  Final   Special Requests   Final    BOTTLES DRAWN AEROBIC AND ANAEROBIC Blood Culture adequate volume   Culture   Final    NO GROWTH 3 DAYS Performed at [facility], [address] [place], [place] [address]    Report Status PENDING  Incomplete  Blood culture (routine x 2)     Status: None (Preliminary result)   Collection Time: 05/18/23  2:56 PM   Specimen: BLOOD  Result Value Ref Range Status   Specimen Description BLOOD RIGHT HAND  Final   Special Requests    Final    BOTTLES DRAWN AEROBIC AND ANAEROBIC Blood Culture adequate volume   Culture   Final    NO GROWTH 3 DAYS Performed at [facility], [address], [place], [place] [address]    Report Status PENDING  Incomplete  MRSA Next Gen by PCR, Nasal     Status: None   Collection Time: 05/18/23  9:46 PM   Specimen: Nasal Mucosa; Nasal Swab  Result Value Ref Range Status   MRSA by PCR Next Gen NOT DETECTED NOT DETECTED Final    Comment: (NOTE) The GeneXpert MRSA Assay (FDA approved for NASAL specimens only), is one component of a  comprehensive MRSA colonization surveillance program. It is not intended to diagnose MRSA infection nor to guide or monitor treatment for MRSA infections. Test performance is not FDA approved in patients less than 59 years old. Performed at [facility], [address], [place], [place] [address]      Radiology Studies: [place] RT LOWER EXTREM LTD SOFT TISSUE NON VASCULAR Result Date: 05/19/2023 CLINICAL DATA:  Right lower leg cellulitis, status post I and D. Evaluate for residual abscess. EXAM: ULTRASOUND RIGHT LOWER EXTREMITY LIMITED TECHNIQUE: Ultrasound examination of the lower extremity soft tissues was performed in the area of clinical concern. COMPARISON:  None available FINDINGS: Targeted sonographic evaluation of the anterior right lower leg demonstrates diffuse soft tissue thickening and edema consistent with cellulitis. No organized abscess is identified. IMPRESSION: Diffuse inflammatory changes of the right anterior lower leg soft tissues without organized abscess. Electronically Signed   By: Acquanetta Belling M.D.   On: 05/19/2023 10:17    Scheduled Meds:  apixaban  5 mg Oral BID   budesonide (PULMICORT) nebulizer solution  0.5 mg Nebulization BID   carvedilol  9.375 mg Oral BID WC   Chlorhexidine Gluconate Cloth  6 each Topical Q0600   doxycycline  100 mg Oral Q12H   furosemide  60 mg Intravenous Q12H   levalbuterol  0.63 mg Nebulization BID   losartan  25  mg Oral Daily   polyethylene glycol  17 g Oral Daily   Continuous Infusions:  diltiazem (CARDIZEM) infusion Stopped (05/19/23 1050)    LOS: 3 days   Time spent: 55 mins  Sojourner Behringer Laural Benes, MD How to contact the [facility] Attending or Consulting provider 7A - 7P or covering provider during after hours 7P -7A, for this patient?  Check the care team in [facility] and look for a) attending/consulting [facility] provider listed and b) the [facility] team listed Log into [email or web address] to find provider on call.  Locate the [facility] provider you are looking for under [facility] and page to a number that you can be directly reached. If you still have difficulty reaching the provider, please page the [facility] (Director on Call) for the Hospitalists listed on amion for assistance.  05/21/2023, 9:07 AM

## 2023-05-21 NOTE — Telephone Encounter (Signed)
 Andrew Haley, with case management at [facility], called in stating pt insurance is charging him  $600 for Eliquis. She asked if we are able to provide one month samples for him. Please advise.

## 2023-05-21 NOTE — Consult Note (Addendum)
 Cardiology Consultation   Patient ID: QUAMAINE WEBB MRN: [id number]; DOB: Aug 18, 1965  Admit date: 05/18/2023 Date of Consult: 05/21/2023  PCP:  Elfredia Nevins, MD   [facility] Providers Cardiologist:  Marjo Bicker, MD   {   Patient Profile:   Andrew Haley is a 58 y.o. male with a hx of persistent A-fib, HFrEF (01/2023: 25-30% vs EF of 50-55% 12/2021), HTN, ascending aortic aneurysm (CTA 07/2022: 4.7 cm), cocaine use, prostate cancer, obesity, anemia  who is being seen 05/21/2023 for the evaluation of Afib and acute on chronic HFrEF at the request of Dr. Laural Benes.  History of Present Illness:   Andrew Haley was last seen in heart care on 03/10/2023 with Dr. Jenene Slicker for posthospitalization from 07/2022 follow-up visit for decompensated CHF and diagnosed cardiomyopathy with LVEF 30 to 35% and new onset atrial fibrillation with RVR.  EKG showed Afib, HR 80's. At that time patient was symptom free and d/c metoprolol. Continued on carvedilol 3.125 mg twice daily, Eliquis 5 mg twice daily, Lasix 40 mg daily as needed, losartan 12.5 mg daily, spironolactone increased from 12.5 mg to 25 mg daily.  Presented to the ED 05/18/2023 for shortness of breath and LE swelling.  Noted noncompliance of Furosemide,  Losartan, Eliquis, Spironolactone, Coreg x 2 weeks.  ED exam revealed right LE erythema anteriorly with a small minimally draining wound, pitting edema in bilateral LE, mild rales in bases.  CXR C/W pulmonary edema, small pericardial effusion, and similar cardiomegaly. Significant findings included NA 129, K3.4, GLU 148, CR 1.09, Hgb at baseline of 9.8, WBC 6.1, BNP 682, UDS positive for cocaine, troponins flat and negative 11> 11. EKG showed  A-fib with RVR HR 132, LAFB, prolonged QTC 491 MS.  In the ED patient treated with carvedilol, bolus diltiazem, diltiazem infusion, IV Lasix 40mg , and vancomycin.   On interview, patient reported noncompliance of medications x 2 weeks due to saving  money for court fees.  Shortly afterwards, he developed DOE and LE edema. Also noted chronic orthopnea for several years. Denies any chest pains, palpitations, dizziness.  Since starting back medications, reports significant improvement in breathing and edema. Drinks approximately 7-8 bottles of water daily with 1 to 2 cups of tea. Mainly eats out a lot but denies high sodium diet.  Last used cocaine approximately 1 week ago. He states that he plans to stop cocaine use and is very eager for DCCV. Reports EtOH use on weekends of approximately 3-4 beers and denies tobacco use.   Past Medical History:  Diagnosis Date   Arthritis    Atrial fibrillation (HCC)    Class 1 obesity 07/18/2020   Complication of anesthesia    slow to wake up   Dysrhythmia    Essential hypertension 07/18/2020   Headache    Hypertension    Hypertriglyceridemia    Numbness of fingers of both hands    Prostate cancer (HCC) 2017   Sleep apnea    CPAP    Past Surgical History:  Procedure Laterality Date   ANKLE SURGERY Left    COLONOSCOPY WITH PROPOFOL N/A 09/28/2017   Procedure: COLONOSCOPY WITH PROPOFOL;  Surgeon: Corbin Ade, MD;  Location: [facility];  Service: Endoscopy;  Laterality: N/A;  12:00pm   HAND SURGERY Right 2003   cysts    JOINT REPLACEMENT     LESION REMOVAL Left 09/23/2013   Procedure: MINOR EXCISION 3 CM SKIN LESION OF LEFT THIGH ;  Surgeon: Dalia Heading, MD;  Location: [facility];  Service: General;  Laterality: Left;   LYMPHADENECTOMY Bilateral 11/29/2015   Procedure: LYMPHADENECTOMY;  Surgeon: Heloise Purpura, MD;  Location: [facility];  Service: Urology;  Laterality: Bilateral;   ROBOT ASSISTED LAPAROSCOPIC RADICAL PROSTATECTOMY N/A 11/29/2015   Procedure: XI ROBOTIC ASSISTED LAPAROSCOPIC RADICAL PROSTATECTOMY LEVEL 3;  Surgeon: Heloise Purpura, MD;  Location: [facility];  Service: Urology;  Laterality: N/A;   TOTAL KNEE ARTHROPLASTY Left 02/22/2014   Procedure: LEFT TOTAL KNEE ARTHROPLASTY;   Surgeon: Jacki Cones, MD;  Location: [facility];  Service: Orthopedics;  Laterality: Left;   TOTAL KNEE ARTHROPLASTY Right 05/23/2014   Procedure: RIGHT TOTAL KNEE ARTHROPLASTY;  Surgeon: Ranee Gosselin, MD;  Location: [facility];  Service: Orthopedics;  Laterality: Right;   TOTAL KNEE REVISION Right 03/04/2021   Procedure: TOTAL KNEE REVISION;  Surgeon: Durene Romans, MD;  Location: [facility];  Service: Orthopedics;  Laterality: Right;     Home Medications:  Prior to Admission medications   Medication Sig Start Date End Date Taking? Authorizing Provider  apixaban (ELIQUIS) 5 MG TABS tablet Take 1 tablet (5 mg total) by mouth 2 (two) times daily. 08/02/22  Yes Johnson, Clanford L, MD  carvedilol (COREG) 3.125 MG tablet Take 3.125 mg by mouth 2 (two) times daily. 02/19/23  Yes [provider]  diclofenac (VOLTAREN) 75 MG EC tablet Take 75 mg by mouth 2 (two) times daily. 03/19/23  Yes [provider]  folic acid (FOLVITE) 1 MG tablet Take 1 tablet (1 mg total) by mouth daily. 08/03/22  Yes Johnson, Clanford L, MD  furosemide (LASIX) 40 MG tablet Take 1 tablet (40 mg total) by mouth daily. Patient taking differently: Take 40 mg by mouth daily as needed for fluid or edema. 08/02/22  Yes Johnson, Clanford L, MD  losartan (COZAAR) 25 MG tablet Take 0.5 tablets (12.5 mg total) by mouth daily. 08/02/22  Yes Johnson, Clanford L, MD  potassium chloride SA (KLOR-CON M) 20 MEQ tablet Take 1 tablet (20 mEq total) by mouth daily. 08/02/22  Yes Johnson, Clanford L, MD  spironolactone (ALDACTONE) 25 MG tablet Take 1 tablet (25 mg total) by mouth daily. 03/10/23  Yes Mallipeddi, Vishnu P, MD    Inpatient Medications: Scheduled Meds:  apixaban  5 mg Oral BID   budesonide (PULMICORT) nebulizer solution  0.5 mg Nebulization BID   carvedilol  9.375 mg Oral BID WC   Chlorhexidine Gluconate Cloth  6 each Topical Q0600   doxycycline  100 mg Oral Q12H   furosemide  60 mg Intravenous Q12H   levalbuterol  0.63  mg Nebulization BID   losartan  25 mg Oral Daily   polyethylene glycol  17 g Oral Daily   Continuous Infusions:  diltiazem (CARDIZEM) infusion Stopped (05/19/23 1050)   PRN Meds: acetaminophen **OR** acetaminophen, albuterol, bisacodyl, HYDROcodone-acetaminophen, ondansetron **OR** ondansetron (ZOFRAN) IV  Allergies:    Allergies  Allergen Reactions   Ertapenem Hives   Cefazolin Hives   Ceftriaxone Other (See Comments)    Itching, sweating and anxiety with ceftriaxone .     Social History:   Social History   Socioeconomic History   Marital status: Single    Spouse name: Not on file   Number of children: Not on file   Years of education: Not on file   Highest education level: Not on file  Occupational History   Occupation: disability  Tobacco Use   Smoking status: Never   Smokeless tobacco: Never  Vaping Use   Vaping status: Never  Used  Substance and Sexual Activity   Alcohol use: Yes    Comment: 5-6 beers daily   Drug use: Not Currently    Types: Cocaine    Comment: 1 week ago   Sexual activity: Yes    Birth control/protection: None  Other Topics Concern   Not on file  Social History Narrative   Not on file     Family History:   Family History  Problem Relation Age of Onset   Heart disease Mother    Throat cancer Father    Colon cancer Neg Hx    Colon polyps Neg Hx      ROS:  Please see the history of present illness.  All other ROS reviewed and negative.     Physical Exam/Data:   Vitals:   05/20/23 2023 05/21/23 0338 05/21/23 0836 05/21/23 0838  BP:  122/89  131/81  Pulse:  100  (!) 112  Resp:    (!) 22  Temp:  98 F (36.7 C)  97.6 F (36.4 C)  TempSrc:  Oral  Axillary  SpO2: 96% 96% 99% 100%  Weight:  124.2 kg    Height:        Intake/Output Summary (Last 24 hours) at 05/21/2023 1011 Last data filed at 05/21/2023 0843 Gross per 24 hour  Intake 4431.8 ml  Output 6000 ml  Net -1568.2 ml      05/21/2023    3:38 AM 05/20/2023    3:11 AM  05/19/2023    4:03 AM  Last 3 Weights  Weight (lbs) 273 lb 13 oz 267 lb 10.2 oz 266 lb 15.6 oz  Weight (kg) 124.2 kg 121.4 kg 121.1 kg     Body mass index is 34.22 kg/m.  General: Sitting up for in bed, in no acute distress HEENT: normal Neck: no JVD Vascular: No carotid bruits; Distal pulses 2+ bilaterally Cardiac: Irregularly irregular; no murmur Lungs:  clear to auscultation bilaterally, no wheezing, rhonchi or rales  Abd: soft, nontender, no hepatomegaly  Ext: 2+ pitting edema Skin: warm and dry  Neuro:  CNs 2-12 intact, no focal abnormalities noted Psych:  Normal affect   EKG:  The EKG was personally reviewed and demonstrates:  A-fib with RVR HR 132, LAFB, prolonged QTC 491 MS Telemetry:  Telemetry was personally reviewed and demonstrates: A-fib with RVR, HR 110  Relevant CV Studies: ECHO IMPRESSIONS 01/2023  1. Limited study.   2. Rhythm consistent with atrial fibrillation with RVR (HR 130-150)  during study.   3. Left ventricular ejection fraction, by estimation, is 25 to 30%. The  left ventricle has severely decreased function. The left ventricle  demonstrates global hypokinesis with some regional variation. The left  ventricular internal cavity size was mildly   dilated. There is mild concentric left ventricular hypertrophy. Left  ventricular diastolic parameters are indeterminate.   4. Right ventricular systolic function is moderately reduced. The right  ventricular size is normal.   5. Left atrial size was moderately dilated.   6. Right atrial size was severely dilated.   7. The mitral valve is grossly normal. Mild to moderate mitral valve  regurgitation.   8. Tricuspid valve regurgitation is moderate.   9. The aortic valve was not well visualized. Aortic valve regurgitation  is not visualized.   Comparison(s): Prior images reviewed side by side. LVEF 25-30% range in  the setting of rapid atrial fibrillation.   Laboratory Data:  High Sensitivity Troponin:    Recent Labs  Lab 05/18/23 1417 05/18/23  1649  TROPONINIHS 11 11     Chemistry Recent Labs  Lab 05/19/23 0506 05/20/23 0032 05/20/23 0617  NA 136 136 135  K 3.1* 3.6 3.7  CL 93* 93* 94*  CO2 32 31 33*  GLUCOSE 119* 121* 121*  BUN 17 19 20   CREATININE 0.98 1.10 1.03  CALCIUM 8.5* 8.5* 8.5*  MG  --  1.9 1.9  GFRNONAA >60 >60 >60  ANIONGAP 11 12 8     Recent Labs  Lab 05/18/23 1417 05/20/23 0032  PROT 7.2 6.9  ALBUMIN 3.6 3.3*  AST 76* 49*  ALT 74* 58*  ALKPHOS 102 109  BILITOT 1.9* 1.2   Hematology Recent Labs  Lab 05/18/23 1417 05/19/23 0506 05/20/23 0032  WBC 6.1 6.4 8.2  RBC 4.54 4.40 4.65  HGB 9.8* 9.2* 9.9*  HCT 34.5* 32.4* 34.8*  MCV 76.0* 73.6* 74.8*  MCH 21.6* 20.9* 21.3*  MCHC 28.4* 28.4* 28.4*  RDW 17.4* 17.0* 17.2*  PLT 331 292 307    BNP Recent Labs  Lab 05/18/23 1417  BNP 682.0*     Radiology/Studies:  [place] RT LOWER EXTREM LTD SOFT TISSUE NON VASCULAR Result Date: 05/19/2023 CLINICAL DATA:  Right lower leg cellulitis, status post I and D. Evaluate for residual abscess. EXAM: ULTRASOUND RIGHT LOWER EXTREMITY LIMITED TECHNIQUE: Ultrasound examination of the lower extremity soft tissues was performed in the area of clinical concern. COMPARISON:  None available FINDINGS: Targeted sonographic evaluation of the anterior right lower leg demonstrates diffuse soft tissue thickening and edema consistent with cellulitis. No organized abscess is identified. IMPRESSION: Diffuse inflammatory changes of the right anterior lower leg soft tissues without organized abscess. Electronically Signed   By: Acquanetta Belling M.D.   On: 05/19/2023 10:17   DG Chest 2 View Result Date: 05/18/2023 CLINICAL DATA:  Shortness of breath EXAM: CHEST - 2 VIEW COMPARISON:  Chest radiograph dated 02/16/2023 FINDINGS: Low lung volumes with bronchovascular crowding. Mild diffuse interstitial opacities. Blunting of the costophrenic angles. No pneumothorax. Similar enlarged  cardiomediastinal silhouette. No acute osseous abnormality. IMPRESSION: 1. Mild diffuse interstitial opacities, which may represent pulmonary edema. 2. Blunting of the costophrenic angles, which may represent small pleural effusions. 3. Similar cardiomegaly. Electronically Signed   By: Agustin Cree M.D.   On: 05/18/2023 15:39    Assessment and Plan:   Acute on chronic HFrEF Presented to the ED with noncompliance of medications x2 weeks leading to DOE and LE edema. Pulmonary edema on CXR. - ECHO 01/2023: limited by afib, EF 25-30%, LV severly decreased function, global hypokinesis, LV mildy dilated, mild concentric LVH, RV systolic function moderately reduced, LA moderately dilated, RA severly dilated, mild to moderate MVR, moderate TVR - Currently on IV lasix 60 mg BID and notes improvement in breathing and edema. +2 pitting edema.  - Most likely tachycardia mediated cardiomyopathy versus EtOH or cocaine induced - I/O: -9,562, wt 265 >273 (suspect weight inaccurate).  Continue strict I's/O's, daily weights, daily renal function - this am Cr 1.03, K 3.7, Mg 1.9. Continue Lasix 60 mg BID   - Losartan decreased to 12.5 mg, Coreg increased to 12.5 mg BID for improved rate control. - Would avoid SGLT2i at this given current infection.  - Can consider adding spironolactone tomorrow if BP allows  A-fib with RVR Presented with heart rate up to 130s and improved on diltiazem gtt, which was d/c on 05/19/23. Would avoid Cardizem due to EF 25-30%. - Telemetry this am: Afib, mainly 110's. Continue monitor telemetry.  - Previously  scheduled for DCCV but canceled due to cocaine use - continue on Eliquis 5 mg, increase Coreg to 12.5 mg BID   - Although warfarin is a cheaper option, would avoid this medication with noncompliance concerns.   HTN  - BP this am 122/89 - Decrease losartan to 12.5 mg since increasing Coreg.   RLE cellulitis, abscess s/p I&D - Managed by hospitalist - Currently on vancomycin and  Unasyn  Risk Assessment/Risk Scores:   New York Heart Association (NYHA) Functional Class NYHA Class II  CHA2DS2-VASc Score = 2   This indicates a 2.2% annual risk of stroke. The patient's score is based upon: CHF History: 1 HTN History: 1 Diabetes History: 0 Stroke History: 0 Vascular Disease History: 0 Age Score: 0 Gender Score: 0       For questions or updates, please contact [facility] Please consult [email or web address] for contact info under    Signed, Basilio Cairo, PA-C  05/21/2023 10:11 AM    Attending note:  Patient seen and examined.  I reviewed his records and discussed the case with Ms. Dunlap PA-C.  Andrew Haley presents with acute on chronic HFrEF (LVEF 25-30%) in the setting of rapid, persistent atrial fibrillation, noncompliance with recent medication use over the last 2 weeks, and positive for cocaine use 1 week ago.  He is admitted for further management, also including treatment of lower extremity cellulitis.  On examination he is afebrile, heart rate 100-120 range in atrial fibrillation by telemetry, systolics running 120s to 130s.  Lungs exhibit decreased basilar breath sounds with scattered rhonchi.  Cardiac exam with indistinct PMI, irregularly irregular rhythm.  He has peripheral edema and also erythema of the right lower extremity.  Pertinent lab work includes potassium 3.7, creatinine 1.03 with GFR greater than 60, high-sensitivity troponin I levels normal, hemoglobin 9.9, platelets 307, UDS positive for cocaine.  Chest x-ray shows cardiomegaly with blunting of the costophrenic angles as well as interstitial edema.  ECG shows atrial fibrillation with incomplete right bundle much block and left anterior fascicular block.  Situation discussed with the patient.  Reinforced complete cessation of recurring cocaine use.  He is not a good candidate for cardioversion until he is able to demonstrate abstinence from drug use and also regular compliance with  his medications.  For now would focus on heart rate control strategy and optimizing GDMT for cardiomyopathy as possible.  Increase Coreg to 12.5 mg twice daily and reduce Cozaar to 12.5 mg daily for now.  May need to add digoxin for additional heart rate control if beta-blocker cannot be further uptitrated.  Not good candidate for SGLT2 inhibitor at this time given current cellulitis.  May be able to add MRA next.  Also resuming Eliquis 5 mg twice daily.  Jonelle Sidle, M.D., F.A.C.C.

## 2023-05-22 ENCOUNTER — Other Ambulatory Visit (HOSPITAL_COMMUNITY): Payer: Self-pay

## 2023-05-22 ENCOUNTER — Telehealth: Payer: Self-pay | Admitting: Pharmacy Technician

## 2023-05-22 DIAGNOSIS — I482 Chronic atrial fibrillation, unspecified: Secondary | ICD-10-CM | POA: Diagnosis not present

## 2023-05-22 DIAGNOSIS — I5023 Acute on chronic systolic (congestive) heart failure: Secondary | ICD-10-CM | POA: Diagnosis not present

## 2023-05-22 DIAGNOSIS — L03115 Cellulitis of right lower limb: Secondary | ICD-10-CM | POA: Diagnosis not present

## 2023-05-22 LAB — BASIC METABOLIC PANEL WITH GFR
Anion gap: 7 (ref 5–15)
BUN: 22 mg/dL — ABNORMAL HIGH (ref 6–20)
CO2: 33 mmol/L — ABNORMAL HIGH (ref 22–32)
Calcium: 8.5 mg/dL — ABNORMAL LOW (ref 8.9–10.3)
Chloride: 95 mmol/L — ABNORMAL LOW (ref 98–111)
Creatinine, Ser: 0.76 mg/dL (ref 0.61–1.24)
GFR, Estimated: >60 mL/min (ref >60)
Glucose, Bld: 112 mg/dL — ABNORMAL HIGH (ref 70–99)
Potassium: 3.6 mmol/L (ref 3.5–5.1)
Sodium: 135 mmol/L (ref 135–145)

## 2023-05-22 LAB — CBC
HCT: 33.7 % — ABNORMAL LOW (ref 39.0–52.0)
Hemoglobin: 9.6 g/dL — ABNORMAL LOW (ref 13.0–17.0)
MCH: 21.3 pg — ABNORMAL LOW (ref 26.0–34.0)
MCHC: 28.5 g/dL — ABNORMAL LOW (ref 30.0–36.0)
MCV: 74.9 fL — ABNORMAL LOW (ref 80.0–100.0)
Platelets: 284 10*3/uL (ref 150–400)
RBC: 4.5 MIL/uL (ref 4.22–5.81)
RDW: 17.5 % — ABNORMAL HIGH (ref 11.5–15.5)
WBC: 8.1 10*3/uL (ref 4.0–10.5)
nRBC: 0 % (ref 0.0–0.2)

## 2023-05-22 LAB — MAGNESIUM: Magnesium: 1.9 mg/dL (ref 1.7–2.4)

## 2023-05-22 LAB — GLUCOSE, CAPILLARY: Glucose-Capillary: 110 mg/dL — ABNORMAL HIGH (ref 70–99)

## 2023-05-22 MED ORDER — CARVEDILOL 12.5 MG PO TABS
25.0000 mg | ORAL_TABLET | Freq: Two times a day (BID) | ORAL | Status: DC
  Administered 2023-05-22 – 2023-05-23 (×2): 25 mg via ORAL
  Filled 2023-05-22 (×2): qty 2

## 2023-05-22 MED ORDER — APIXABAN 5 MG PO TABS: 5.0000 mg | ORAL_TABLET | Freq: Two times a day (BID) | ORAL | 0 refills | Status: DC

## 2023-05-22 NOTE — Telephone Encounter (Signed)
 1 Month supply of samples packed for patient to pick up. Patient informed and verbalized understanding of plan. Will send to med assist team to help with assistance

## 2023-05-22 NOTE — Telephone Encounter (Addendum)
 PAP: Patient assistance application for Eliquis  through [employer] ([employer] has been mailed to pt's home address on file. Provider portion of application will be faxed to provider's office. - uploaded in media tab

## 2023-05-22 NOTE — Telephone Encounter (Signed)
 Left patient message to call back regarding samples. After talking with patient will send to [facility] MED ASSIST TEAM

## 2023-05-22 NOTE — Progress Notes (Signed)
 PROGRESS NOTE   Andrew Haley  ZOX:[id number] DOB: Nov 18, 1965 DOA: 05/18/2023 PCP: Elfredia Nevins, MD   Chief Complaint  Patient presents with   Shortness of Breath   Level of care: Telemetry  Brief Admission History:  58 y.o. male with a PMH significant for Afib, CHF, alcohol use, h/o cocaine use, anemia, prostate cancer, HTN, obesity, cardiomyopathy. At baseline, they live at home and are independent with ADLs.   They presented from home to the ED on 05/18/2023 with R leg pain x several days.  He was admitted for right lower extremity cellulitis as well as suspected abscess along with acute on chronic HFrEF and A-fib with RVR due to medication nonadherence.  He had been out of medication for about 2 weeks.  He claims he cannot afford the high cost of apixaban due to his insurance deductible not being met.  He had been receiving samples from cardiology office but when those ran out he was out of his medication completely.    Assessment and Plan:  RLE cellulitis  abscess s/p I&D- spontaneously draining. IMPROVED  - [place] with no findings of abscess or foreign material - s/p vancomycin. He was treated with IV unasyn.  MRSA PCR negative.  Patient has several antibiotic allergies - f/u blood cultures- no growth to date  - transitioned to oral antibiotic (doxycycline) 05/20/23 - requested patient to elevate leg as much as possible    Acute on chronic HFrEF- due to medication non-adherence. Pulmonary edema on cxray and rales on exam. Significant LE edema. Already had >3L UOP since lasix given. EF 25-30%. BNP 682 - TOC consulted for medication assistance but they report unable to provide assistance as patient already has medical insurance.    - daily weights - continue IV lasix - strict I/O - echo not repeated since he had one recently 12/24  - he remains volume overloaded    Intake/Output Summary (Last 24 hours) at 05/22/2023 1218 Last data filed at 05/22/2023 0700 Gross per 24 hour  Intake  960 ml  Output 5700 ml  Net -4740 ml   Filed Weights   05/20/23 0311 05/21/23 0338 05/22/23 0714  Weight: 121.4 kg 124.2 kg 124.2 kg   Hypokalemia -Repleted and reevaluate in a.m.   Afib RVR- HR up to 130s on presentation. Improved on diltiazem gtt. - he was briefly on IV diltiazem but now back on carvedilol - continue home eliquis for now but will discuss with him transition to warfarin - given his noncompliance I worry that warfarin use is going to be difficult to manage if he is not following up as required; cardiology team does not recommend he go on warfarin and are helping to get him samples of apixaban from their office - given persistent tachycardic rates, cardiology team increased carvedilol dose to 25 mg BID with hold parameters for SBP and HR.   Tachycardia mediated cardiomyopathy - he has been unable to have a cardioversion due to recreational substance use (cocaine) but he reports now he is "clean" but his UDS positive this admission.   HTN - continue home carvedilol, spironolactone - losartan discontinued to give room to increase carvedilol per cardiology team   Obesity, class I -BMI 33.37   DVT prophylaxis: apixaban Code Status: Full  Family Communication:  Disposition: anticipate home when medically cleared   Consultants:  cardiology  Procedures:   Antimicrobials:    Subjective: Pt still eager to go home but he remains fluid overloaded and heart rate still suboptimally controlled  Objective: Vitals:   05/22/23 0614 05/22/23 0714 05/22/23 0758 05/22/23 0928  BP: 110/77  110/77 (!) 96/57  Pulse: (!) 108  (!) 108 (!) 109  Resp:    (!) 21  Temp: 98 F (36.7 C)   97.8 F (36.6 C)  TempSrc: Oral   Oral  SpO2: 96%   94%  Weight:  124.2 kg    Height:        Intake/Output Summary (Last 24 hours) at 05/22/2023 1218 Last data filed at 05/22/2023 0700 Gross per 24 hour  Intake 960 ml  Output 5700 ml  Net -4740 ml   Filed Weights   05/20/23 0311  05/21/23 0338 05/22/23 0714  Weight: 121.4 kg 124.2 kg 124.2 kg   Examination:  General exam: Appears calm and comfortable  Respiratory system: Clear to auscultation. Respiratory effort normal. Cardiovascular system: irregularly irregular and tachycardic, normal S1 & S2 heard. No JVD, murmurs, rubs, gallops or clicks. 2+ pedal edema. Gastrointestinal system: Abdomen is nondistended, soft and nontender. No organomegaly or masses felt. Normal bowel sounds heard. Central nervous system: Alert and oriented. No focal neurological deficits. Extremities: 2+ edema BLEs, Symmetric 5 x 5 power. Skin: No rashes, lesions or ulcers. Psychiatry: Judgement and insight appear normal. Mood & affect appropriate.   Data Reviewed: I have personally reviewed following labs and imaging studies  CBC: Recent Labs  Lab 05/18/23 1417 05/19/23 0506 05/20/23 0032 05/22/23 0425  WBC 6.1 6.4 8.2 8.1  NEUTROABS 4.7  --   --   --   HGB 9.8* 9.2* 9.9* 9.6*  HCT 34.5* 32.4* 34.8* 33.7*  MCV 76.0* 73.6* 74.8* 74.9*  PLT 331 292 307 284    Basic Metabolic Panel: Recent Labs  Lab 05/18/23 1417 05/19/23 0506 05/20/23 0032 05/20/23 0617 05/22/23 0425  NA 129* 136 136 135 135  K 3.4* 3.1* 3.6 3.7 3.6  CL 87* 93* 93* 94* 95*  CO2 28 32 31 33* 33*  GLUCOSE 148* 119* 121* 121* 112*  BUN 15 17 19 20  22*  CREATININE 1.09 0.98 1.10 1.03 0.76  CALCIUM 8.9 8.5* 8.5* 8.5* 8.5*  MG  --   --  1.9 1.9 1.9    CBG: Recent Labs  Lab 05/22/23 0722  GLUCAP 110*    Recent Results (from the past 240 hours)  Blood culture (routine x 2)     Status: None (Preliminary result)   Collection Time: 05/18/23  2:54 PM   Specimen: BLOOD  Result Value Ref Range Status   Specimen Description BLOOD BLOOD RIGHT HAND  Final   Special Requests   Final    BOTTLES DRAWN AEROBIC AND ANAEROBIC Blood Culture adequate volume   Culture   Final    NO GROWTH 3 DAYS Performed at [facility], [address], [place], [place]  [address]    Report Status PENDING  Incomplete  Blood culture (routine x 2)     Status: None (Preliminary result)   Collection Time: 05/18/23  2:56 PM   Specimen: BLOOD  Result Value Ref Range Status   Specimen Description BLOOD RIGHT HAND  Final   Special Requests   Final    BOTTLES DRAWN AEROBIC AND ANAEROBIC Blood Culture adequate volume   Culture   Final    NO GROWTH 3 DAYS Performed at [facility], [address], [place], [place] [address]    Report Status PENDING  Incomplete  MRSA Next Gen by PCR, Nasal     Status: None   Collection Time:  05/18/23  9:46 PM   Specimen: Nasal Mucosa; Nasal Swab  Result Value Ref Range Status   MRSA by PCR Next Gen NOT DETECTED NOT DETECTED Final    Comment: (NOTE) The GeneXpert MRSA Assay (FDA approved for NASAL specimens only), is one component of a comprehensive MRSA colonization surveillance program. It is not intended to diagnose MRSA infection nor to guide or monitor treatment for MRSA infections. Test performance is not FDA approved in patients less than 14 years old. Performed at [facility], [address] [place], [place] [address]      Radiology Studies: No results found.   Scheduled Meds:  apixaban  5 mg Oral BID   budesonide (PULMICORT) nebulizer solution  0.5 mg Nebulization BID   carvedilol  25 mg Oral BID WC   Chlorhexidine Gluconate Cloth  6 each Topical Q0600   doxycycline  100 mg Oral Q12H   furosemide  60 mg Intravenous Q12H   levalbuterol  0.63 mg Nebulization BID   polyethylene glycol  17 g Oral Daily   Continuous Infusions:    LOS: 4 days   Time spent: 55 mins  Ossie Yebra Laural Benes, MD How to contact the [facility] Attending or Consulting provider 7A - 7P or covering provider during after hours 7P -7A, for this patient?  Check the care team in [facility] and look for a) attending/consulting [facility] provider listed and b) the [facility] team listed Log into [email or web address] to find provider on call.  Locate the [facility] provider you are  looking for under [facility] and page to a number that you can be directly reached. If you still have difficulty reaching the provider, please page the [facility] (Director on Call) for the Hospitalists listed on amion for assistance.  05/22/2023, 12:18 PM

## 2023-05-22 NOTE — Care Management Important Message (Signed)
 Important Message  Patient Details  Name: Andrew Haley MRN: [id number] Date of Birth: [birth year]   Important Message Given:  Yes - Medicare IM     Corey Harold 05/22/2023, 3:02 PM

## 2023-05-22 NOTE — Progress Notes (Signed)
 Progress Note  Patient Name: Andrew Haley Date of Encounter: 05/22/2023  Primary Cardiologist: Marjo Bicker, MD  Interval Summary   Chart reviewed.  Patient in bedside chair this morning.  States that he wants to go home.  Still has peripheral edema evidence and heart rate in atrial fibrillation running 100-115 on current therapy.  Vital Signs    Vitals:   05/21/23 2050 05/22/23 0100 05/22/23 0614 05/22/23 0758  BP: 97/66 112/78 110/77 110/77  Pulse: (!) 116 (!) 112 (!) 108 (!) 108  Resp:      Temp: 98.8 F (37.1 C) 97.9 F (36.6 C) 98 F (36.7 C)   TempSrc: Oral Oral Oral   SpO2: 98% 100% 96%   Weight:      Height:        Intake/Output Summary (Last 24 hours) at 05/22/2023 0911 Last data filed at 05/22/2023 0700 Gross per 24 hour  Intake 1200 ml  Output 5700 ml  Net -4500 ml   Filed Weights   05/19/23 0403 05/20/23 0311 05/21/23 0338  Weight: 121.1 kg 121.4 kg 124.2 kg    Physical Exam   GEN: No acute distress.   Neck: Difficult to assess JVP. Cardiac: Indistinct PMI, irregularly irregular without gallop.  Respiratory: Nonlabored. Clear to auscultation bilaterally. GI: Soft, nontender, bowel sounds present. MS: Lower extremity edema, 2+, erythema on the right. Neuro:  Nonfocal. Psych: Alert and oriented x 3. Normal affect.  ECG/Telemetry    Telemetry reviewed showing atrial fibrillation.  Labs    Chemistry Recent Labs  Lab 05/18/23 1417 05/19/23 0506 05/20/23 0032 05/20/23 0617 05/22/23 0425  NA 129*   < > 136 135 135  K 3.4*   < > 3.6 3.7 3.6  CL 87*   < > 93* 94* 95*  CO2 28   < > 31 33* 33*  GLUCOSE 148*   < > 121* 121* 112*  BUN 15   < > 19 20 22*  CREATININE 1.09   < > 1.10 1.03 0.76  CALCIUM 8.9   < > 8.5* 8.5* 8.5*  PROT 7.2  --  6.9  --   --   ALBUMIN 3.6  --  3.3*  --   --   AST 76*  --  49*  --   --   ALT 74*  --  58*  --   --   ALKPHOS 102  --  109  --   --   BILITOT 1.9*  --  1.2  --   --   GFRNONAA >60   < > >60 >60  >60  ANIONGAP 14   < > 12 8 7    < > = values in this interval not displayed.    Hematology Recent Labs  Lab 05/19/23 0506 05/20/23 0032 05/22/23 0425  WBC 6.4 8.2 8.1  RBC 4.40 4.65 4.50  HGB 9.2* 9.9* 9.6*  HCT 32.4* 34.8* 33.7*  MCV 73.6* 74.8* 74.9*  MCH 20.9* 21.3* 21.3*  MCHC 28.4* 28.4* 28.5*  RDW 17.0* 17.2* 17.5*  PLT 292 307 284   Cardiac Enzymes Recent Labs  Lab 05/18/23 1417 05/18/23 1649  TROPONINIHS 11 11   Lipid Panel     Component Value Date/Time   TRIG 60 01/03/2022 0500    Cardiac Studies   Echocardiogram 02/05/2023:  1. Limited study.   2. Rhythm consistent with atrial fibrillation with RVR (HR 130-150)  during study.   3. Left ventricular ejection fraction, by estimation, is 25 to  30%. The  left ventricle has severely decreased function. The left ventricle  demonstrates global hypokinesis with some regional variation. The left  ventricular internal cavity size was mildly   dilated. There is mild concentric left ventricular hypertrophy. Left  ventricular diastolic parameters are indeterminate.   4. Right ventricular systolic function is moderately reduced. The right  ventricular size is normal.   5. Left atrial size was moderately dilated.   6. Right atrial size was severely dilated.   7. The mitral valve is grossly normal. Mild to moderate mitral valve  regurgitation.   8. Tricuspid valve regurgitation is moderate.   9. The aortic valve was not well visualized. Aortic valve regurgitation  is not visualized.   Assessment & Plan   1.  Persistent atrial fibrillation with RVR, CHA2DS2-VASc score is 2.  Management complicated by recurrent cocaine use and also inconsistent GDMT.  Currently on Eliquis 5 mg twice daily and Coreg 12.5 mg twice daily which was increased yesterday.  2.  Acute on chronic HFrEF, LVEF 25 to 30% by echocardiogram in December 2024.  He has been on Lasix 60 mg IV twice daily with good diuresis and stable renal function,  net urine output of approximately 14 L and still evidence of fluid overload on examination.  In addition to above medications also on Cozaar 12.5 mg daily.  Not good candidate for SGLT2 inhibitor at this time.  3.  Right lower extremity cellulitis with abscess status post I&D.  On antibiotics per primary team.  4.  Recurrent cocaine use, UDS positive with reported use one week ago.  Patient wants to go home, although still not optimally stabilized from the perspective of heart rate control or fluid status, I am hopeful that he will stay and not leave AMA.  His risk of readmission is high.  For now increase Coreg to 25 mg twice daily, stop Cozaar to avoid hypotension, continue IV diuresis if he stays in the hospital.  I do not think that an attempt at cardioversion makes sense at this time until he is able to demonstrate cessation of cocaine use and consistent follow-up on medical therapy.  Would favor heart rate control strategy if possible and keeping his medical regimen simple.  Digoxin could be added if heart rate control not adequately addressed on higher dose Coreg.  Depending on blood pressure will probably need to hold off ARB/ARNI at least for now, not a good candidate for SGLT 2 inhibitor, may ultimately be able to transition his diuretics to MRA and Lasix.  For questions or updates, please contact [facility] Please consult [email or web address] for contact info under   Signed, Nona Dell, MD  05/22/2023, 9:11 AM

## 2023-05-22 NOTE — Plan of Care (Signed)

## 2023-05-22 NOTE — Plan of Care (Signed)
   Problem: Education: Goal: Knowledge of General Education information will improve Description: Including pain rating scale, medication(s)/side effects and non-pharmacologic comfort measures Outcome: Progressing   Problem: Clinical Measurements: Goal: Respiratory complications will improve Outcome: Progressing   Problem: Nutrition: Goal: Adequate nutrition will be maintained Outcome: Progressing

## 2023-05-23 DIAGNOSIS — I5023 Acute on chronic systolic (congestive) heart failure: Secondary | ICD-10-CM | POA: Diagnosis not present

## 2023-05-23 DIAGNOSIS — I482 Chronic atrial fibrillation, unspecified: Secondary | ICD-10-CM | POA: Diagnosis not present

## 2023-05-23 LAB — CULTURE, BLOOD (ROUTINE X 2)
Culture: NO GROWTH
Culture: NO GROWTH
Special Requests: ADEQUATE
Special Requests: ADEQUATE

## 2023-05-23 LAB — BASIC METABOLIC PANEL WITH GFR
Anion gap: 10 (ref 5–15)
BUN: 24 mg/dL — ABNORMAL HIGH (ref 6–20)
CO2: 33 mmol/L — ABNORMAL HIGH (ref 22–32)
Calcium: 8.7 mg/dL — ABNORMAL LOW (ref 8.9–10.3)
Chloride: 95 mmol/L — ABNORMAL LOW (ref 98–111)
Creatinine, Ser: 0.79 mg/dL (ref 0.61–1.24)
GFR, Estimated: >60 mL/min (ref >60)
Glucose, Bld: 102 mg/dL — ABNORMAL HIGH (ref 70–99)
Potassium: 3.6 mmol/L (ref 3.5–5.1)
Sodium: 138 mmol/L (ref 135–145)

## 2023-05-23 MED ORDER — CARVEDILOL 25 MG PO TABS: 25.0000 mg | ORAL_TABLET | Freq: Two times a day (BID) | ORAL | 0 refills | Status: DC

## 2023-05-23 MED ORDER — DOXYCYCLINE HYCLATE 100 MG PO TABS: 100.0000 mg | ORAL_TABLET | Freq: Two times a day (BID) | ORAL | 0 refills | Status: AC

## 2023-05-23 MED ORDER — APIXABAN 5 MG PO TABS: 5.0000 mg | ORAL_TABLET | Freq: Two times a day (BID) | ORAL | 0 refills | Status: DC

## 2023-05-23 MED ORDER — APIXABAN 5 MG PO TABS
5.0000 mg | ORAL_TABLET | Freq: Two times a day (BID) | ORAL | Status: DC
  Filled 2023-05-23: qty 1

## 2023-05-23 NOTE — Progress Notes (Signed)
 Pt up in recliner on first rounds. Grunts and moans with each movement. Asking for graham crackers, states, "I'm about to get sick I'm so hungry." Breakfast tray delivered to pt by dietary staff.  Pt states, "I don't care what the doctor says this morning, I am putting on my clothes and leaving this place at ten o'clock today! I'm sick of this shit, I want to go home!" Advised pt of rationale for continued stay due to his elevated heart rate and need for continued diuresis, pt just shakes his head "No" and grumbles.

## 2023-05-23 NOTE — Discharge Summary (Signed)
 Physician Discharge Summary  Andrew Haley WNU:[id number] DOB: [birth year] DOA: 05/18/2023  PCP: Kathyleen Parkins, MD Cardiology: [place]ip[facility]  Admit date: 05/18/2023 Discharge date: 05/23/2023  Disposition: PATIENT DISCHARGING AGAINST MEDICAL ADVICE OF PHYSICIAN  [ High Risk For Readmission ]  Recommendations for Outpatient Follow-up:  Follow up with PCP in 1 weeks Follow up with cardiology office on 4/14 to pick up apixaban samples Follow up with cardiologist in 2 weeks  Please obtain BMP/CBC in 1-2 weeks  Discharge Condition: NOT MEDICALLY READY TO DISCHARGE  CODE STATUS: FULL DIET: 2 gram low sodium heart healthy recommended    Brief Hospitalization Summary: Please see all hospital notes, images, labs for full details of the hospitalization. Admission provider HPI:  58 y.o. male with a PMH significant for Afib, CHF, alcohol use, h/o cocaine use, anemia, prostate cancer, HTN, obesity, cardiomyopathy. At baseline, they live at home and are independent with ADLs.   Pt presented from home to the ED on 05/18/2023 with R leg pain x several days.  He was admitted for right lower extremity cellulitis as well as suspected abscess along with acute on chronic HFrEF and A-fib with RVR due to medication nonadherence.  He had been out of medication for about 2 weeks.  He claims he cannot afford the high cost of apixaban due to his insurance deductible not being met.  He had been receiving samples from cardiology office but when those ran out he was out of his medication completely.  Hospital Course by listed problems   RLE cellulitis  abscess s/p I&D- spontaneously draining. IMPROVED  - US  with no findings of abscess or foreign material - s/p vancomycin. He was treated with IV unasyn.  MRSA PCR negative.  Patient has several antibiotic allergies - f/u blood cultures- no growth to date  - transitioned to oral antibiotic (doxycycline) 05/20/23 - requested patient to elevate leg as much as possible     Acute on chronic HFrEF- due to medication non-adherence. Pulmonary edema on cxray and rales on exam. Significant LE edema. EF 25-30%. BNP 682 - TOC consulted for medication assistance but they report unable to provide assistance as patient already has medical insurance.    - daily weights - he was treated with IV lasix 60 mg BID - strict I/O monitored - echo not repeated since he had one recently 12/24  - he remains volume overloaded  - he has diuresed more than 15 L since admission and still volume overloaded -- he is refusing to stay in the hospital any longer and is signing out against medical advice.  I have counseled with him on multiple occasions and despite verbalizing understanding he insists on discharging home today.  He will resume his home oral lasix tablets 40 mg daily and he says he will follow up with his cardiologist in [place].     Intake/Output Summary (Last 24 hours) at 05/22/2023 1218 Last data filed at 05/22/2023 0700    Gross per 24 hour  Intake 960 ml  Output 5700 ml  Net -4740 ml    Filed Weights   05/20/23 0311 05/21/23 0338 05/22/23 0714  Weight: 121.4 kg 124.2 kg 124.2 kg   Hypokalemia -Repleted   Afib RVR- HR up to 130s on presentation. Improved on diltiazem gtt. - he was briefly on IV diltiazem but now back on carvedilol - continue home eliquis for now but will discuss with him transition to warfarin - given his noncompliance I worry that warfarin use is going to be difficult  to manage if he is not following up as required; cardiology team does not recommend he go on warfarin and are helping to get him samples of apixaban from their office - given persistent tachycardic rates, cardiology team increased carvedilol dose to 25 mg BID with hold parameters for SBP and HR.  - pharmacy provided him with samples of apixaban thru Monday when he will be able to pick up the samples arranged for him thru the cardiology office for 1 month supply and they are getting him on  the patient assistance program for apixaban.    Tachycardia mediated cardiomyopathy - he has been unable to have a cardioversion due to recreational substance use (cocaine) but he reports now he is "clean" but his UDS positive this admission. - difficult to control with medications and patient discharging against medical advice before further medications can be attempted.  Dr Mcdowell had mentioned trying digoxin however patient left AMA before we could make any further changes.  For now  he will continue carvedilol 25 mg BID. He is high risk for readmission and he is high risk for adverse events.  I discussed with him prior to him leaving and he verbalized understanding.     HTN - continue home carvedilol, spironolactone - losartan discontinued to give room to increase carvedilol per cardiology team   Obesity, class I -BMI 33.37  Discharge Diagnoses:  Principal Problem:   Atrial fibrillation [facility]) Active Problems:   Acute on chronic congestive heart failure [facility])   Discharge Instructions: Discharge Instructions     Ambulatory referral to Cardiology   Complete by: As directed    Hospital follow up, left AMA from hospital on 4/12      Allergies as of 05/23/2023       Reactions   Ertapenem Hives   Cefazolin Hives   Ceftriaxone Other (See Comments)   Itching, sweating and anxiety with ceftriaxone .         Medication List     STOP taking these medications    diclofenac 75 MG EC tablet Commonly known as: VOLTAREN   folic acid 1 MG tablet Commonly known as: FOLVITE   losartan 25 MG tablet Commonly known as: COZAAR   spironolactone 25 MG tablet Commonly known as: ALDACTONE       TAKE these medications    apixaban 5 MG Tabs tablet Commonly known as: ELIQUIS Take 1 tablet (5 mg total) by mouth 2 (two) times daily.   carvedilol 25 MG tablet Commonly known as: COREG Take 1 tablet (25 mg total) by mouth 2 (two) times daily. What changed:  medication  strength how much to take   doxycycline 100 MG tablet Commonly known as: VIBRA-TABS Take 1 tablet (100 mg total) by mouth every 12 (twelve) hours for 5 days.   furosemide 40 MG tablet Commonly known as: LASIX Take 1 tablet (40 mg total) by mouth daily. What changed:  when to take this reasons to take this   potassium chloride SA 20 MEQ tablet Commonly known as: KLOR-CON M Take 1 tablet (20 mEq total) by mouth daily.        Allergies  Allergen Reactions   Ertapenem Hives   Cefazolin Hives   Ceftriaxone Other (See Comments)    Itching, sweating and anxiety with ceftriaxone .    Allergies as of 05/23/2023       Reactions   Ertapenem Hives   Cefazolin Hives   Ceftriaxone Other (See Comments)   Itching, sweating and anxiety with  ceftriaxone .         Medication List     STOP taking these medications    diclofenac 75 MG EC tablet Commonly known as: VOLTAREN   folic acid 1 MG tablet Commonly known as: FOLVITE   losartan 25 MG tablet Commonly known as: COZAAR   spironolactone 25 MG tablet Commonly known as: ALDACTONE       TAKE these medications    apixaban 5 MG Tabs tablet Commonly known as: ELIQUIS Take 1 tablet (5 mg total) by mouth 2 (two) times daily.   carvedilol 25 MG tablet Commonly known as: COREG Take 1 tablet (25 mg total) by mouth 2 (two) times daily. What changed:  medication strength how much to take   doxycycline 100 MG tablet Commonly known as: VIBRA-TABS Take 1 tablet (100 mg total) by mouth every 12 (twelve) hours for 5 days.   furosemide 40 MG tablet Commonly known as: LASIX Take 1 tablet (40 mg total) by mouth daily. What changed:  when to take this reasons to take this   potassium chloride SA 20 MEQ tablet Commonly known as: KLOR-CON M Take 1 tablet (20 mEq total) by mouth daily.        Procedures/Studies: US  RT LOWER EXTREM LTD SOFT TISSUE NON VASCULAR Result Date: 05/19/2023 CLINICAL DATA:  Right lower leg  cellulitis, status post I and D. Evaluate for residual abscess. EXAM: ULTRASOUND RIGHT LOWER EXTREMITY LIMITED TECHNIQUE: Ultrasound examination of the lower extremity soft tissues was performed in the area of clinical concern. COMPARISON:  None available FINDINGS: Targeted sonographic evaluation of the anterior right lower leg demonstrates diffuse soft tissue thickening and edema consistent with cellulitis. No organized abscess is identified. IMPRESSION: Diffuse inflammatory changes of the right anterior lower leg soft tissues without organized abscess. Electronically Signed   By: Elester Grim M.D.   On: 05/19/2023 10:17   DG Chest 2 View Result Date: 05/18/2023 CLINICAL DATA:  Shortness of breath EXAM: CHEST - 2 VIEW COMPARISON:  Chest radiograph dated 02/16/2023 FINDINGS: Low lung volumes with bronchovascular crowding. Mild diffuse interstitial opacities. Blunting of the costophrenic angles. No pneumothorax. Similar enlarged cardiomediastinal silhouette. No acute osseous abnormality. IMPRESSION: 1. Mild diffuse interstitial opacities, which may represent pulmonary edema. 2. Blunting of the costophrenic angles, which may represent small pleural effusions. 3. Similar cardiomegaly. Electronically Signed   By: Limin  Xu M.D.   On: 05/18/2023 15:39     Subjective: Pt tells me he is leaving today by 10 am despite anything that I say or do and he is insistent that he go home.  He understands that the doctor feels that he is not medically ready.    Discharge Exam: Vitals:   05/23/23 0846 05/23/23 0852  BP:  127/74  Pulse:  (!) 115  Resp:    Temp:    SpO2: 96%    Vitals:   05/23/23 0600 05/23/23 0844 05/23/23 0846 05/23/23 0852  BP: (!) 96/53 127/74  127/74  Pulse: (!) 105 (!) 115  (!) 115  Resp:      Temp: 98 F (36.7 C) 98.1 F (36.7 C)    TempSrc: Axillary Oral    SpO2:  95% 96%   Weight:      Height:       General: Pt is alert, awake, not in acute distress Cardiovascular: normal S1/S2 +,  no rubs, no gallops Respiratory: CTA bilaterally, no wheezing, no rhonchi Abdominal: Soft, NT, ND, bowel sounds + Extremities: no edema, no cyanosis  The results of significant diagnostics from this hospitalization (including imaging, microbiology, ancillary and laboratory) are listed below for reference.     Microbiology: Recent Results (from the past 240 hours)  Blood culture (routine x 2)     Status: None   Collection Time: 05/18/23  2:54 PM   Specimen: BLOOD  Result Value Ref Range Status   Specimen Description BLOOD BLOOD RIGHT HAND  Final   Special Requests   Final    BOTTLES DRAWN AEROBIC AND ANAEROBIC Blood Culture adequate volume   Culture   Final    NO GROWTH 5 DAYS Performed at [facility], [address] [place], [place] [address]    Report Status 05/23/2023 FINAL  Final  Blood culture (routine x 2)     Status: None   Collection Time: 05/18/23  2:56 PM   Specimen: BLOOD  Result Value Ref Range Status   Specimen Description BLOOD RIGHT HAND  Final   Special Requests   Final    BOTTLES DRAWN AEROBIC AND ANAEROBIC Blood Culture adequate volume   Culture   Final    NO GROWTH 5 DAYS Performed at [facility], [address], [place], [place] [address]    Report Status 05/23/2023 FINAL  Final  MRSA Next Gen by PCR, Nasal     Status: None   Collection Time: 05/18/23  9:46 PM   Specimen: Nasal Mucosa; Nasal Swab  Result Value Ref Range Status   MRSA by PCR Next Gen NOT DETECTED NOT DETECTED Final    Comment: (NOTE) The GeneXpert MRSA Assay (FDA approved for NASAL specimens only), is one component of a comprehensive MRSA colonization surveillance program. It is not intended to diagnose MRSA infection nor to guide or monitor treatment for MRSA infections. Test performance is not FDA approved in patients less than 13 years old. Performed at [facility], [address] [place], [place] [address]      Labs: BNP (last 3 results) Recent Labs    02/14/23 0435  02/16/23 0506 05/18/23 1417  BNP 269.0* 378.0* 682.0*   Basic Metabolic Panel: Recent Labs  Lab 05/19/23 0506 05/20/23 0032 05/20/23 0617 05/22/23 0425 05/23/23 0539  NA 136 136 135 135 138  K 3.1* 3.6 3.7 3.6 3.6  CL 93* 93* 94* 95* 95*  CO2 32 31 33* 33* 33*  GLUCOSE 119* 121* 121* 112* 102*  BUN 17 19 20  22* 24*  CREATININE 0.98 1.10 1.03 0.76 0.79  CALCIUM 8.5* 8.5* 8.5* 8.5* 8.7*  MG  --  1.9 1.9 1.9  --    Liver Function Tests: Recent Labs  Lab 05/18/23 1417 05/20/23 0032  AST 76* 49*  ALT 74* 58*  ALKPHOS 102 109  BILITOT 1.9* 1.2  PROT 7.2 6.9  ALBUMIN 3.6 3.3*   Recent Labs  Lab 05/18/23 1417  LIPASE 41   No results for input(s): "AMMONIA" in the last 168 hours. CBC: Recent Labs  Lab 05/18/23 1417 05/19/23 0506 05/20/23 0032 05/22/23 0425  WBC 6.1 6.4 8.2 8.1  NEUTROABS 4.7  --   --   --   HGB 9.8* 9.2* 9.9* 9.6*  HCT 34.5* 32.4* 34.8* 33.7*  MCV 76.0* 73.6* 74.8* 74.9*  PLT 331 292 307 284   Cardiac Enzymes: No results for input(s): "CKTOTAL", "CKMB", "CKMBINDEX", "TROPONINI" in the last 168 hours. BNP: Invalid input(s): "POCBNP" CBG: Recent Labs  Lab 05/22/23 0722  GLUCAP 110*   D-Dimer No results for input(s): "DDIMER" in the last 72 hours. Hgb A1c  No results for input(s): "HGBA1C" in the last 72 hours. Lipid Profile No results for input(s): "CHOL", "HDL", "LDLCALC", "TRIG", "CHOLHDL", "LDLDIRECT" in the last 72 hours. Thyroid function studies No results for input(s): "TSH", "T4TOTAL", "T3FREE", "THYROIDAB" in the last 72 hours.  Invalid input(s): "FREET3" Anemia work up No results for input(s): "VITAMINB12", "FOLATE", "FERRITIN", "TIBC", "IRON", "RETICCTPCT" in the last 72 hours. Urinalysis    Component Value Date/Time   COLORURINE YELLOW 05/18/2023 1505   APPEARANCEUR CLEAR 05/18/2023 1505   LABSPEC 1.005 05/18/2023 1505   PHURINE 6.0 05/18/2023 1505   GLUCOSEU NEGATIVE 05/18/2023 1505   HGBUR NEGATIVE 05/18/2023 1505    BILIRUBINUR NEGATIVE 05/18/2023 1505   KETONESUR NEGATIVE 05/18/2023 1505   PROTEINUR NEGATIVE 05/18/2023 1505   UROBILINOGEN 1.0 05/25/2014 0822   NITRITE NEGATIVE 05/18/2023 1505   LEUKOCYTESUR NEGATIVE 05/18/2023 1505   Sepsis Labs Recent Labs  Lab 05/18/23 1417 05/19/23 0506 05/20/23 0032 05/22/23 0425  WBC 6.1 6.4 8.2 8.1   Microbiology Recent Results (from the past 240 hours)  Blood culture (routine x 2)     Status: None   Collection Time: 05/18/23  2:54 PM   Specimen: BLOOD  Result Value Ref Range Status   Specimen Description BLOOD BLOOD RIGHT HAND  Final   Special Requests   Final    BOTTLES DRAWN AEROBIC AND ANAEROBIC Blood Culture adequate volume   Culture   Final    NO GROWTH 5 DAYS Performed at [facility], [address], [place], [place] [address]    Report Status 05/23/2023 FINAL  Final  Blood culture (routine x 2)     Status: None   Collection Time: 05/18/23  2:56 PM   Specimen: BLOOD  Result Value Ref Range Status   Specimen Description BLOOD RIGHT HAND  Final   Special Requests   Final    BOTTLES DRAWN AEROBIC AND ANAEROBIC Blood Culture adequate volume   Culture   Final    NO GROWTH 5 DAYS Performed at [facility], [address], [place], [place] [address]    Report Status 05/23/2023 FINAL  Final  MRSA Next Gen by PCR, Nasal     Status: None   Collection Time: 05/18/23  9:46 PM   Specimen: Nasal Mucosa; Nasal Swab  Result Value Ref Range Status   MRSA by PCR Next Gen NOT DETECTED NOT DETECTED Final    Comment: (NOTE) The GeneXpert MRSA Assay (FDA approved for NASAL specimens only), is one component of a comprehensive MRSA colonization surveillance program. It is not intended to diagnose MRSA infection nor to guide or monitor treatment for MRSA infections. Test performance is not FDA approved in patients less than 30 years old. Performed at [facility] [address], [place], [place] [address]    Time coordinating discharge:  35  mins  SIGNED:  Faustino Hook, MD  [facility] 05/23/2023, 10:33 AM How to contact the [facility] Attending or Consulting provider 7A - 7P or covering provider during after hours 7P -7A, for this patient?  Check the care team in [facility] and look for a) attending/consulting [facility] provider listed and b) the [facility] team listed Log into [email or web address] and use [facility] universal password to access. If you do not have the password, please contact the hospital operator. Locate the [facility] provider you are looking for under [facility] and page to a number that you can be directly reached. If you still have difficulty reaching the provider, please page the [facility] (Director on Call) for the  Hospitalists listed on amion for assistance.

## 2023-05-23 NOTE — Progress Notes (Signed)
 Pt has been up OOB and ambulated to bathroom using FWW, back to bed. Medicated for c/o right ankle pain, states pain has eased off now. MD Lincoln Renshaw in earlier to talk with pt, pt stated that he was going to leave today, MD Lincoln Renshaw advised pt that he is currently not medically stable to leave and he will need to sign AMA form. Pt agreeable. Telemetry and IV sites removed. Pt signed AMA form and left via WC with friend who is transporting him home. MD Lincoln Renshaw notified of pt leaving.

## 2023-05-23 NOTE — Plan of Care (Signed)

## 2023-05-25 ENCOUNTER — Telehealth (HOSPITAL_COMMUNITY): Payer: Self-pay | Admitting: *Deleted

## 2023-05-25 NOTE — Telephone Encounter (Signed)
 Attempted to call patient regarding upcoming cardiac CT appointment. Due to recent hospitalization, MD recommends postponing CT scan and follow up with cardiology as scheduled.   Left message for patient x 2 to make aware CT scan will be cancelled tomorrow and number provided to call back to reschedule.   Kerri Peed RN Navigator Cardiac Imaging [facility] [phone] Office

## 2023-05-26 ENCOUNTER — Ambulatory Visit (HOSPITAL_BASED_OUTPATIENT_CLINIC_OR_DEPARTMENT_OTHER)

## 2023-05-27 DIAGNOSIS — S8251XA Displaced fracture of medial malleolus of right tibia, initial encounter for closed fracture: Secondary | ICD-10-CM | POA: Diagnosis not present

## 2023-05-27 DIAGNOSIS — M19071 Primary osteoarthritis, right ankle and foot: Secondary | ICD-10-CM | POA: Diagnosis not present

## 2023-06-02 ENCOUNTER — Encounter: Payer: Self-pay | Admitting: Internal Medicine

## 2023-06-02 ENCOUNTER — Ambulatory Visit: Payer: Medicare HMO | Attending: Internal Medicine | Admitting: Internal Medicine

## 2023-06-02 ENCOUNTER — Other Ambulatory Visit: Payer: Self-pay

## 2023-06-02 ENCOUNTER — Telehealth: Payer: Self-pay | Admitting: Internal Medicine

## 2023-06-02 VITALS — BP 128/76 | HR 102 | Ht 75.0 in | Wt 276.6 lb

## 2023-06-02 DIAGNOSIS — I4819 Other persistent atrial fibrillation: Secondary | ICD-10-CM

## 2023-06-02 DIAGNOSIS — F1410 Cocaine abuse, uncomplicated: Secondary | ICD-10-CM | POA: Diagnosis not present

## 2023-06-02 DIAGNOSIS — Z01810 Encounter for preprocedural cardiovascular examination: Secondary | ICD-10-CM

## 2023-06-02 DIAGNOSIS — F1010 Alcohol abuse, uncomplicated: Secondary | ICD-10-CM

## 2023-06-02 MED ORDER — AMIODARONE HCL 200 MG PO TABS: 200.0000 mg | ORAL_TABLET | Freq: Two times a day (BID) | ORAL | 5 refills | Status: DC

## 2023-06-02 MED ORDER — TORSEMIDE 20 MG PO TABS: 40.0000 mg | ORAL_TABLET | Freq: Every day | ORAL | 5 refills | Status: DC

## 2023-06-02 NOTE — Telephone Encounter (Signed)
 Advised patient of his Pre-op appointment May 5th at 1:15 and procedure appointment will be May 8th @ 9:00. Will have labs and urine analysis completed at pre-op appointment. Patient verbalized understanding. Updated his medications he stated he was taking that wasn't on his list at his appointment as well

## 2023-06-02 NOTE — Progress Notes (Addendum)
 Cardiology Office Note  Date: 06/02/2023   ID: Andrew Haley, DOB August 16, 1965, MRN [id number]  PCP:  Kathyleen Parkins, MD  Cardiologist:  Lasalle Pointer, MD Electrophysiologist:  None   Reason for Office Visit: Posthospitalization follow-up visit   History of Present Illness: Andrew Haley is a 58 y.o. male known to have tachycardia induced cardiomyopathy LVEF 25-30%, persistent A-fib is here for posthospitalization follow-up visit.  Patient was admitted to [facility] in 6/24 and 04/25 with ADHF.  Echocardiogram showed LVEF 25 to 30%.  Cocaine user.  He is currently antibiotics for right lower EXTR cellulitis.  He is here for follow-up visit.  He wants to get DCCV done.  He is still volume overloaded.  Has leg swelling.  He is extremely worried about his heart.  No angina.  He said he is clean for the last 10 days except that he took cocaine couple of days ago.  He takes p.o. Lasix  40 mg 5 times a day.  Sometimes even more.  He is grossly volume overloaded.  No syncope, dizziness or palpitations.  He was seen by CT surgery recently for stable 4.9 cm ascending aortic aneurysm.  Past Medical History:  Diagnosis Date   Arthritis    Atrial fibrillation (HCC)    Class 1 obesity 07/18/2020   Complication of anesthesia    slow to wake up   Dysrhythmia    Essential hypertension 07/18/2020   Headache    Hypertension    Hypertriglyceridemia    Numbness of fingers of both hands    Prostate cancer (HCC) 2017   Sleep apnea    CPAP    Past Surgical History:  Procedure Laterality Date   ANKLE SURGERY Left    COLONOSCOPY WITH PROPOFOL  N/A 09/28/2017   Procedure: COLONOSCOPY WITH PROPOFOL ;  Surgeon: Suzette Espy, MD;  Location: [facility];  Service: Endoscopy;  Laterality: N/A;  12:00pm   HAND SURGERY Right 2003   cysts    JOINT REPLACEMENT     LESION REMOVAL Left 09/23/2013   Procedure: MINOR EXCISION 3 CM SKIN LESION OF LEFT THIGH ;  Surgeon: Beau Bound, MD;   Location: [facility];  Service: General;  Laterality: Left;   LYMPHADENECTOMY Bilateral 11/29/2015   Procedure: LYMPHADENECTOMY;  Surgeon: Florencio Hunting, MD;  Location: [facility];  Service: Urology;  Laterality: Bilateral;   ROBOT ASSISTED LAPAROSCOPIC RADICAL PROSTATECTOMY N/A 11/29/2015   Procedure: XI ROBOTIC ASSISTED LAPAROSCOPIC RADICAL PROSTATECTOMY LEVEL 3;  Surgeon: Florencio Hunting, MD;  Location: [facility];  Service: Urology;  Laterality: N/A;   TOTAL KNEE ARTHROPLASTY Left 02/22/2014   Procedure: LEFT TOTAL KNEE ARTHROPLASTY;  Surgeon: Florencia Hunter, MD;  Location: [facility];  Service: Orthopedics;  Laterality: Left;   TOTAL KNEE ARTHROPLASTY Right 05/23/2014   Procedure: RIGHT TOTAL KNEE ARTHROPLASTY;  Surgeon: Hazle Lites, MD;  Location: [facility];  Service: Orthopedics;  Laterality: Right;   TOTAL KNEE REVISION Right 03/04/2021   Procedure: TOTAL KNEE REVISION;  Surgeon: Claiborne Crew, MD;  Location: [facility];  Service: Orthopedics;  Laterality: Right;    Current Outpatient Medications  Medication Sig Dispense Refill   apixaban  (ELIQUIS ) 5 MG TABS tablet Take 1 tablet (5 mg total) by mouth 2 (two) times daily. 60 tablet 0   carvedilol  (COREG ) 25 MG tablet Take 1 tablet (25 mg total) by mouth 2 (two) times daily. 60 tablet 0   furosemide  (LASIX ) 40 MG tablet Take 1 tablet (40 mg total) by mouth  daily. (Patient taking differently: Take 40 mg by mouth daily as needed for fluid or edema.) 30 tablet 2   potassium chloride  SA (KLOR-CON  M) 20 MEQ tablet Take 1 tablet (20 mEq total) by mouth daily. 30 tablet 2   No current facility-administered medications for this visit.   Allergies:  Ertapenem , Cefazolin , and Ceftriaxone    Social History: The patient  reports that he has never smoked. He has never used smokeless tobacco. He reports current alcohol use. He reports that he does not currently use drugs after having used the following drugs: Cocaine.   Family History: The patient's family history  includes Heart disease in his mother; Throat cancer in his father.   ROS:  Please see the history of present illness. Otherwise, complete review of systems is positive for none  All other systems are reviewed and negative.   Physical Exam: VS:  BP 128/76   Pulse (!) 102   Ht 6\' 3"  (1.905 m)   Wt 276 lb 9.6 oz (125.5 kg)   SpO2 94%   BMI 34.57 kg/m , BMI Body mass index is 34.57 kg/m.  Wt Readings from Last 3 Encounters:  06/02/23 276 lb 9.6 oz (125.5 kg)  05/22/23 273 lb 13 oz (124.2 kg)  03/18/23 253 lb (114.8 kg)    General: Patient appears comfortable at rest. HEENT: Conjunctiva and lids normal, oropharynx clear with moist mucosa. Neck: Supple, no elevated JVP or carotid bruits, no thyromegaly. Lungs: Clear to auscultation, nonlabored breathing at rest. Cardiac: Irregular rate and rhythm, no S3 or significant systolic murmur, no pericardial rub. Abdomen: Soft, nontender, no hepatomegaly, bowel sounds present, no guarding or rebound. Extremities: No pitting edema, distal pulses 2+. Skin: Warm and dry. Musculoskeletal: No kyphosis. Neuropsychiatric: Alert and oriented x3, affect grossly appropriate.  Recent Labwork: 07/29/2022: TSH 1.547 05/18/2023: B Natriuretic Peptide 682.0 05/20/2023: ALT 58; AST 49 05/22/2023: Hemoglobin 9.6; Magnesium  1.9; Platelets 284 05/23/2023: BUN 24; Creatinine, Ser 0.79; Potassium 3.6; Sodium 138     Component Value Date/Time   TRIG 60 01/03/2022 0500     Assessment and Plan:   Atrial fibrillation with RVR: New diagnosis in June 2024.  He also has new onset cardiomyopathy likely tachycardia mediated with LVEF 25 to 30%.  He had multiple ADHF hospitalization so far. Last hospitalization was in April 2025.  He was previously scheduled for DCCV but had to be canceled due to U-Tox positive for cocaine. His HR today is 102, likely in A-fib. Will go ahead and schedule him for DCCV. Compliant with Eliquis .  U-Tox on the day of the procedure.  If cocaine  positive, will need to cancel.  In the meantime, will start amiodarone  200 mg twice daily.  Hopefully he will convert to NSR.  Informed consent for DCCV Risks and benefits of the procedure are explained to the patient.  Risk include, but not limited to, chest pain, pneumonia, infection, unsuccessful DCCV, possibility of PPM implantation and cardiac arrest.  Patient comprehended these risks and agreed to proceed with the procedure.  Acute on chronic systolic heart failure: He is grossly volume overloaded.  Recommended ER visit and hospital admission for IV diuresis which the patient refused.  Mail Furoscix  80 mg [place] to be taken twice daily for 2 days followed by torsemide  40 mg once daily. BMP in 5 days atfer Furoscix  use. Will discontinue Lasix .  Until he receives Furoscix , he will take torsemide .  Continue carvedilol  25 mg twice daily and losartan  25 mg once daily.  Ascending  aortic aneurysm 4.9 cm: Follows up with CT surgery.  Do not participate in any strenuous activities.  Right lower EXTR cellulitis: On antibiotics now.  Medication Adjustments/Labs and Tests Ordered: Current medicines are reviewed at length with the patient today.  Concerns regarding medicines are outlined above.   Tests Ordered: No orders of the defined types were placed in this encounter.   Medication Changes: No orders of the defined types were placed in this encounter.   Disposition:  Follow up 1 month after DCCV  Signed Lauraine Crespo Beauford Bounds, MD, 06/02/2023 1:42 PM    [facility] [address] [place], [place], [place] [address]

## 2023-06-02 NOTE — Patient Instructions (Addendum)
 Medication Instructions:  Your physician has recommended you make the following change in your medication:  Stop taking Lasix  Start Torsemide  40 mg once daily Start Amiodarone  200 mg twice daily  Continue taking all other medications as prescribed   Labwork: BMET in a week at [facility] in [place]   Testing/Procedures: Your physician has recommended that you have a Cardioversion (DCCV). Electrical Cardioversion uses a jolt of electricity to your heart either through paddles or wired patches attached to your chest. This is a controlled, usually prescheduled, procedure. Defibrillation is done under light anesthesia in the hospital, and you usually go home the day of the procedure. This is done to get your heart back into a normal rhythm. You are not awake for the procedure. Please see the instruction sheet given to you today.   Follow-Up: Your physician recommends that you schedule a follow-up appointment in: Follow up in May  Any Other Special Instructions Will Be Listed Below (If Applicable).  We will be mailing out Furoscix  to you  Thank you for choosing [facility]!     If you need a refill on your cardiac medications before your next appointment, please call your pharmacy.

## 2023-06-02 NOTE — Telephone Encounter (Signed)
 Pt called and stated he is taking 3 more med that he did not tell the provider about today.     [place] Passstuim  25Mg  Folic Acid - 1 mg   Best number [phone]

## 2023-06-04 ENCOUNTER — Telehealth: Payer: Self-pay

## 2023-06-04 NOTE — Telephone Encounter (Signed)
 Called patient advised him received a fax from [facility] that he declined to fill Furoscix . He stated he received a call but never heard back from them. Gave patient the number to call back [phone] to give them a call

## 2023-06-11 ENCOUNTER — Telehealth: Payer: Self-pay | Admitting: Internal Medicine

## 2023-06-11 NOTE — Telephone Encounter (Signed)
Left VM 2nd attempt.

## 2023-06-11 NOTE — Telephone Encounter (Signed)
 Pt c/o medication issue:  1. Name of Medication: Furoscix  80 mg injection  2. How are you currently taking this medication (dosage and times per day)?    3. Are you having a reaction (difficulty breathing--STAT)?   4. What is your medication issue? Patient filed a complaint for a defected device- requesting a replacement prescription- she needs a verbal prescription , fax or e-scribe for this injection. It needs to be for 1 unit and put Replacement RX if you send it by hard copy  If you fax it, please fax to [phone]

## 2023-06-11 NOTE — Telephone Encounter (Signed)
 I currently do not see furosix on his med list in his chart other than it was talked about does patient need to have this medication?

## 2023-06-12 NOTE — Patient Instructions (Signed)
 Your procedure is scheduled on: 06/18/2023  Report to [facility] Main Entrance at   10:30  AM.  Call this number if you have problems the morning of surgery: [phone]   Remember:   Do not Eat or Drink after midnight         No Smoking the morning of surgery  :  Take these medicines the morning of surgery with A SIP OF WATER : Amiodarone  and eliquis   DO NOT MISS ANY DOSES OF ELIQUIS    Do not wear jewelry, make-up or nail polish.  Do not wear lotions, powders, or perfumes. You may wear deodorant.  Do not shave 48 hours prior to surgery. Men may shave face and neck.  Do not bring valuables to the hospital.  Contacts, dentures or bridgework may not be worn into surgery.  Leave suitcase in the car. After surgery it may be brought to your room.  For patients admitted to the hospital, checkout time is 11:00 AM the day of discharge.   Patients discharged the day of surgery will not be allowed to drive home.    Electrical Cardioversion Electrical cardioversion is the delivery of a jolt of electricity to restore a normal rhythm to the heart. A rhythm that is too fast or is not regular (arrhythmia) keeps the heart from pumping blood well. There is also another type of cardioversion called a chemical (pharmacologic) cardioversion. This is when your health care provider gives you one or more medicines to bring back your regular heart rhythm. Electrical cardioversion is done as a scheduled procedure for arrhythmiasthat are not life-threatening. Electrical cardioversion may also be done in an emergency for sudden life-threatening arrhythmias. Tell a health care provider about: Any allergies you have. All medicines you are taking, including vitamins, herbs, eye drops, creams, and over-the-counter medicines. Any problems you or family members have had with sedatives or anesthesia. Any bleeding problems you have. Any surgeries you have had, including a pacemaker, defibrillator, or other implanted  device. Any medical conditions you have. Whether you are pregnant or may be pregnant. What are the risks? Your provider will talk with you about risks. These include: Allergic reactions to medicines. Irritation to the skin on your chest or back where the sticky pads (electrodes) or paddles were put during electrical cardioversion. A blood clot that breaks free and travels to other parts of your body, such as your brain. Return of a worse abnormal heart rhythm that will need to be treated with medicines, a pacemaker, or an implantable cardioverter defibrillator (ICD). What happens before the procedure? Medicines Your provider may give you: Blood-thinning medicines (anticoagulants) so your blood does not clot as easily. If your provider gives you this medicine, you may need to take it for 4 weeks before the procedure. Medicines to help stabilize your heart rate and rhythm. Ask your provider about: Changing or stopping your regular medicines. These include any diabetes medicines or blood thinners you take. Taking medicines such as aspirin  and ibuprofen . These medicines can thin your blood. Do not take them unless your provider tells you to. Taking over-the-counter medicines, vitamins, herbs, and supplements. General instructions Follow instructions from your provider about what you may eat and drink. Do not put any lotions, powders, or ointments on your chest and back for 24 hours before the procedure. They can cause problems with the electrodes or paddles used to deliver electricity to your heart. Do not wear jewelry as this can interfere with delivering electricity to your heart. If you will  be going home right after the procedure, plan to have a responsible adult: Take you home from the hospital or clinic. You will not be allowed to drive. Care for you for the time you are told. Tests You may have an exam or testing. This may include: Blood labs. A transesophageal echocardiogram  (TEE). What happens during the procedure?     An IV will be inserted into one of your veins. You will be given a sedative. This helps you relax. Electrodes or metal paddles will be placed on your chest. They may be placed in one of these ways: One placed on your right chest, the other on the left ribs. One placed on your chest and the other on your back. An electrical shock will be delivered. The shock briefly stops (resets) your heart rhythm. Your provider will check to see if your heart rhythm is now normal. Some people need only one shock. Some need more to restore a normal heart rhythm. The procedure may vary among providers and hospitals. What happens after the procedure? Your blood pressure, heart rate, breathing rate, and blood oxygen level will be monitored until you leave the hospital or clinic. Your heart rhythm will be watched to make sure it does not change. This information is not intended to replace advice given to you by your health care provider. Make sure you discuss any questions you have with your health care provider. Document Revised: 09/19/2021 Document Reviewed: 09/19/2021 Elsevier Patient Education  2024 Else[employer]. General Anesthesia, Adult, Care After The following information offers guidance on how to care for yourself after your procedure. Your health care provider may also give you more specific instructions. If you have problems or questions, contact your health care provider. What can I expect after the procedure? After the procedure, it is common for people to: Have pain or discomfort at the IV site. Have nausea or vomiting. Have a sore throat or hoarseness. Have trouble concentrating. Feel cold or chills. Feel weak, sleepy, or tired (fatigue). Have soreness and body aches. These can affect parts of the body that were not involved in surgery. Follow these instructions at home: For the time period you were told by your health care  provider:  Rest. Do not participate in activities where you could fall or become injured. Do not drive or use machinery. Do not drink alcohol. Do not take sleeping pills or medicines that cause drowsiness. Do not make important decisions or sign legal documents. Do not take care of children on your own. General instructions Drink enough fluid to keep your urine pale yellow. If you have sleep apnea, surgery and certain medicines can increase your risk for breathing problems. Follow instructions from your health care provider about wearing your sleep device: Anytime you are sleeping, including during daytime naps. While taking prescription pain medicines, sleeping medicines, or medicines that make you drowsy. Return to your normal activities as told by your health care provider. Ask your health care provider what activities are safe for you. Take over-the-counter and prescription medicines only as told by your health care provider. Do not use any products that contain nicotine or tobacco. These products include cigarettes, chewing tobacco, and vaping devices, such as e-cigarettes. These can delay incision healing after surgery. If you need help quitting, ask your health care provider. Contact a health care provider if: You have nausea or vomiting that does not get better with medicine. You vomit every time you eat or drink. You have pain that does not  get better with medicine. You cannot urinate or have bloody urine. You develop a skin rash. You have a fever. Get help right away if: You have trouble breathing. You have chest pain. You vomit blood. These symptoms may be an emergency. Get help right away. Call 911. Do not wait to see if the symptoms will go away. Do not drive yourself to the hospital. Summary After the procedure, it is common to have a sore throat, hoarseness, nausea, vomiting, or to feel weak, sleepy, or fatigue. For the time period you were told by your health care  provider, do not drive or use machinery. Get help right away if you have difficulty breathing, have chest pain, or vomit blood. These symptoms may be an emergency. This information is not intended to replace advice given to you by your health care provider. Make sure you discuss any questions you have with your health care provider. Document Revised: 04/26/2021 Document Reviewed: 04/26/2021 Elsevier Patient Education  2024 [employer].

## 2023-06-15 ENCOUNTER — Encounter (HOSPITAL_COMMUNITY): Payer: Self-pay

## 2023-06-15 ENCOUNTER — Encounter (HOSPITAL_COMMUNITY)
Admission: RE | Admit: 2023-06-15 | Discharge: 2023-06-15 | Disposition: A | Discharge status: Home / Self Care | Source: Ambulatory Visit | Attending: Internal Medicine | Admitting: Internal Medicine

## 2023-06-15 DIAGNOSIS — F1410 Cocaine abuse, uncomplicated: Secondary | ICD-10-CM

## 2023-06-15 NOTE — Telephone Encounter (Signed)
 Felica, called to check on status of call from last week.

## 2023-06-16 NOTE — Telephone Encounter (Signed)
 Called the Furosix drug rep and left her a message to return my call regarding this matter.

## 2023-06-16 NOTE — Telephone Encounter (Signed)
 Drug rep returning call. Please advise

## 2023-06-16 NOTE — Pre-Procedure Instructions (Signed)
 Patient had pre-op scheduled for 1000 and did not show. He called at 1100 and stated that he would be here by 1500 for his pre-op. Staff left at 1630 and patient still had not shown and we have been unable to reach him. Dr Mallipeddi and [facility] OR scheduler notified.

## 2023-06-16 NOTE — Telephone Encounter (Signed)
 Received call from old Rep for Furoscix  she provided me with new information. Left voicemail to return my call

## 2023-06-17 ENCOUNTER — Encounter (HOSPITAL_COMMUNITY)
Admission: RE | Admit: 2023-06-17 | Discharge: 2023-06-17 | Disposition: A | Discharge status: Home / Self Care | Source: Ambulatory Visit | Attending: Internal Medicine | Admitting: Internal Medicine

## 2023-06-17 ENCOUNTER — Encounter (HOSPITAL_COMMUNITY): Payer: Self-pay

## 2023-06-17 DIAGNOSIS — F1410 Cocaine abuse, uncomplicated: Secondary | ICD-10-CM | POA: Diagnosis not present

## 2023-06-17 DIAGNOSIS — Z01812 Encounter for preprocedural laboratory examination: Secondary | ICD-10-CM | POA: Diagnosis not present

## 2023-06-17 HISTORY — DX: Dyspnea, unspecified: R06.00

## 2023-06-17 LAB — BASIC METABOLIC PANEL WITH GFR
Anion gap: 11 (ref 5–15)
BUN: 55 mg/dL — ABNORMAL HIGH (ref 6–20)
CO2: 26 mmol/L (ref 22–32)
Calcium: 8.4 mg/dL — ABNORMAL LOW (ref 8.9–10.3)
Chloride: 93 mmol/L — ABNORMAL LOW (ref 98–111)
Creatinine, Ser: 1.76 mg/dL — ABNORMAL HIGH (ref 0.61–1.24)
GFR, Estimated: 44 mL/min — ABNORMAL LOW (ref >60)
Glucose, Bld: 132 mg/dL — ABNORMAL HIGH (ref 70–99)
Potassium: 4.2 mmol/L (ref 3.5–5.1)
Sodium: 130 mmol/L — ABNORMAL LOW (ref 135–145)

## 2023-06-17 LAB — RAPID URINE DRUG SCREEN, HOSP PERFORMED
Amphetamines: NOT DETECTED
Barbiturates: NOT DETECTED
Benzodiazepines: NOT DETECTED
Cocaine: NOT DETECTED
Opiates: NOT DETECTED
Tetrahydrocannabinol: POSITIVE — AB

## 2023-06-17 NOTE — Telephone Encounter (Signed)
 Spoke with rep frim Furoscix  [place]. He stated that a prescription is not placed and if there is an issue with the medication can provide the patient with a sample.  I called patient and advised him to come by the office to pick up a sample. He explained that he had tried calling the pharmacy and not able to get through. He received packages for 2 Furoscix  and one had already been activated and flashing blue when he had opened it. He was able to used the other one. He is set to have a Cardioversion on tomorrow will come afterwards.

## 2023-06-18 DIAGNOSIS — I4891 Unspecified atrial fibrillation: Secondary | ICD-10-CM

## 2023-06-18 NOTE — Telephone Encounter (Signed)
 Called pt- No answer, was not able to leave VM this time

## 2023-06-19 ENCOUNTER — Encounter (HOSPITAL_COMMUNITY): Payer: Self-pay | Admitting: Anesthesiology

## 2023-06-19 ENCOUNTER — Ambulatory Visit (HOSPITAL_COMMUNITY)
Admission: RE | Admit: 2023-06-19 | Discharge: 2023-06-19 | Disposition: A | Discharge status: Home / Self Care | Attending: Internal Medicine | Admitting: Internal Medicine

## 2023-06-19 ENCOUNTER — Encounter (HOSPITAL_COMMUNITY)
Admission: RE | Disposition: A | Discharge status: Home / Self Care | Payer: Self-pay | Source: Home / Self Care | Attending: Internal Medicine

## 2023-06-19 DIAGNOSIS — F14929 Cocaine use, unspecified with intoxication, unspecified: Secondary | ICD-10-CM | POA: Insufficient documentation

## 2023-06-19 DIAGNOSIS — I4891 Unspecified atrial fibrillation: Secondary | ICD-10-CM | POA: Diagnosis not present

## 2023-06-19 DIAGNOSIS — I5023 Acute on chronic systolic (congestive) heart failure: Secondary | ICD-10-CM

## 2023-06-19 DIAGNOSIS — Z539 Procedure and treatment not carried out, unspecified reason: Secondary | ICD-10-CM | POA: Insufficient documentation

## 2023-06-19 DIAGNOSIS — F1410 Cocaine abuse, uncomplicated: Secondary | ICD-10-CM

## 2023-06-19 LAB — COMPREHENSIVE METABOLIC PANEL WITH GFR
ALT: 278 U/L — ABNORMAL HIGH (ref 0–44)
AST: 128 U/L — ABNORMAL HIGH (ref 15–41)
Albumin: 3.6 g/dL (ref 3.5–5.0)
Alkaline Phosphatase: 115 U/L (ref 38–126)
Anion gap: 10 (ref 5–15)
BUN: 36 mg/dL — ABNORMAL HIGH (ref 6–20)
CO2: 28 mmol/L (ref 22–32)
Calcium: 9 mg/dL (ref 8.9–10.3)
Chloride: 95 mmol/L — ABNORMAL LOW (ref 98–111)
Creatinine, Ser: 1.31 mg/dL — ABNORMAL HIGH (ref 0.61–1.24)
GFR, Estimated: >60 mL/min (ref >60)
Glucose, Bld: 100 mg/dL — ABNORMAL HIGH (ref 70–99)
Potassium: 4.6 mmol/L (ref 3.5–5.1)
Sodium: 133 mmol/L — ABNORMAL LOW (ref 135–145)
Total Bilirubin: 1.2 mg/dL (ref 0.0–1.2)
Total Protein: 7.1 g/dL (ref 6.5–8.1)

## 2023-06-19 LAB — RAPID URINE DRUG SCREEN, HOSP PERFORMED
Amphetamines: NOT DETECTED
Barbiturates: NOT DETECTED
Benzodiazepines: NOT DETECTED
Cocaine: POSITIVE — AB
Opiates: NOT DETECTED
Tetrahydrocannabinol: NOT DETECTED

## 2023-06-19 SURGERY — CARDIOVERSION
Anesthesia: Monitor Anesthesia Care

## 2023-06-19 MED ORDER — SODIUM CHLORIDE 0.9% FLUSH: 3.0000 mL | INTRAVENOUS | Status: DC | PRN

## 2023-06-19 MED ORDER — SODIUM CHLORIDE 0.9% FLUSH: 3.0000 mL | Freq: Two times a day (BID) | INTRAVENOUS | Status: DC

## 2023-06-19 NOTE — Plan of Care (Signed)
 Utox negative for cocaine on 06/17/2023 but positive for cocaine today. DCCV canceled.  Andrew Haley Andrew Hezikiah Retzloff, MD [place]  [facility]  9:44 AM

## 2023-06-25 ENCOUNTER — Ambulatory Visit: Payer: Self-pay

## 2023-06-25 NOTE — Telephone Encounter (Signed)
-----   Message from Vishnu P Mallipeddi sent at 06/22/2023  4:16 PM EDT ----- Derry Flock, can you stop his Amiodarone  given elevated liver enzymes. ----- Message ----- From: Dorma Gash, PA-C Sent: 06/19/2023  11:21 AM EDT To: Vishnu P Mallipeddi, MD  Ordered prior to DCCV which was canceled given positive UDS. Will route to Dr. Mallipeddi to make her aware of significantly elevated LFT's as he is on Amiodarone .

## 2023-06-25 NOTE — Telephone Encounter (Signed)
 The patient has been notified of the result and verbalized understanding.  All questions (if any) were answered. Camilo Cella, [place] 06/25/2023 9:04 AM

## 2023-07-09 ENCOUNTER — Other Ambulatory Visit: Payer: Self-pay | Admitting: Internal Medicine

## 2023-07-13 ENCOUNTER — Ambulatory Visit: Attending: Internal Medicine | Admitting: Internal Medicine

## 2023-07-13 NOTE — Progress Notes (Signed)
 Erroneous encounter - please disregard.

## 2023-07-15 NOTE — Telephone Encounter (Signed)
 Left VM for patient to return call

## 2023-07-20 ENCOUNTER — Emergency Department (HOSPITAL_COMMUNITY)
Admission: EM | Admit: 2023-07-20 | Discharge: 2023-07-20 | Discharge status: Against Medical Advice | Attending: Emergency Medicine | Admitting: Emergency Medicine

## 2023-07-20 ENCOUNTER — Encounter (HOSPITAL_COMMUNITY): Payer: Self-pay

## 2023-07-20 ENCOUNTER — Other Ambulatory Visit: Payer: Self-pay

## 2023-07-20 DIAGNOSIS — Z5321 Procedure and treatment not carried out due to patient leaving prior to being seen by health care provider: Secondary | ICD-10-CM | POA: Diagnosis not present

## 2023-07-20 DIAGNOSIS — R21 Rash and other nonspecific skin eruption: Secondary | ICD-10-CM | POA: Insufficient documentation

## 2023-07-20 NOTE — ED Triage Notes (Signed)
 Pt arrived via POV c/o generalized hives/rash over his body X 2 days. Pt reports his roommate recently Dx with poison oak, and is unsure if he contracted rash from his roommate. Pt also presents with weeping lower extremities and redness.

## 2023-07-22 ENCOUNTER — Emergency Department (HOSPITAL_COMMUNITY)
Admission: EM | Admit: 2023-07-22 | Discharge: 2023-07-22 | Disposition: A | Discharge status: Home / Self Care | Attending: Emergency Medicine | Admitting: Emergency Medicine

## 2023-07-22 ENCOUNTER — Encounter (HOSPITAL_COMMUNITY): Payer: Self-pay

## 2023-07-22 ENCOUNTER — Other Ambulatory Visit: Payer: Self-pay

## 2023-07-22 DIAGNOSIS — T7840XA Allergy, unspecified, initial encounter: Secondary | ICD-10-CM

## 2023-07-22 DIAGNOSIS — L509 Urticaria, unspecified: Secondary | ICD-10-CM | POA: Diagnosis not present

## 2023-07-22 DIAGNOSIS — Z7901 Long term (current) use of anticoagulants: Secondary | ICD-10-CM | POA: Insufficient documentation

## 2023-07-22 DIAGNOSIS — R21 Rash and other nonspecific skin eruption: Secondary | ICD-10-CM | POA: Diagnosis present

## 2023-07-22 MED ORDER — HYDROXYZINE HCL 25 MG PO TABS
50.0000 mg | ORAL_TABLET | Freq: Once | ORAL | Status: AC
  Administered 2023-07-22: 50 mg via ORAL
  Filled 2023-07-22: qty 2

## 2023-07-22 MED ORDER — PREDNISONE 10 MG PO TABS: ORAL_TABLET | ORAL | 0 refills | Status: DC

## 2023-07-22 MED ORDER — HYDROXYZINE HCL 25 MG PO TABS: 25.0000 mg | ORAL_TABLET | Freq: Three times a day (TID) | ORAL | 0 refills | Status: DC | PRN

## 2023-07-22 MED ORDER — PREDNISONE 50 MG PO TABS
60.0000 mg | ORAL_TABLET | Freq: Once | ORAL | Status: AC
  Administered 2023-07-22: 60 mg via ORAL
  Filled 2023-07-22: qty 1

## 2023-07-22 NOTE — ED Triage Notes (Signed)
 Pt arrived via POV c/o on-going rash, hives and reports he LWBS after Triage recently. Pt reports nothing has gotten better.

## 2023-07-22 NOTE — Discharge Instructions (Addendum)
 Please take all medications as directed.  Return to emergency department immediately for any new or worsening symptoms.  Follow-up closely with your primary care doctor on an outpatient basis.

## 2023-07-22 NOTE — ED Provider Notes (Signed)
 [facility] AT [facility] Provider Note   CSN: [id number] Arrival date & time: 07/22/23  9147     History  Chief Complaint  Patient presents with   Rash    DENVER BENTSON is a 58 y.o. male.  Is a 58 year old male who presents to the emergency department the chief complaint of hives over bilateral upper and lower extremities which has been present for Haley for the Haley 4 days.  Patient notes that he has used a Environmental health practitioner.  He denies any other new soaps, lotions, deodorants, detergents, medications to include antibiotics.  He denies any associated chest pain or shortness of breath.  He denies any abdominal pain, nausea, vomiting, diarrhea.  He denies any recent bug bites or tick bites.   Rash      Home Medications Prior to Admission medications   Medication Sig Start Date End Date Taking? Authorizing Provider  apixaban  (ELIQUIS ) 5 MG TABS tablet Take 1 tablet (5 mg total) by mouth 2 (two) times daily. 05/23/23   Johnson, Clanford L, MD  carvedilol  (COREG ) 25 MG tablet TAKE ONE TABLET (25 MG TOTAL) BY MOUTH TWO TIMES DAILY. 07/09/23   Mallipeddi, Vishnu P, MD  folic acid  (FOLVITE ) 1 MG tablet Take 1 mg by mouth daily.    [provider]  furosemide  (LASIX ) 40 MG tablet Take 40 mg by mouth 3 (three) times daily. 07/09/23   [provider]  losartan  (COZAAR ) 25 MG tablet Take 25 mg by mouth daily.    [provider]  potassium chloride  SA (KLOR-CON  M) 20 MEQ tablet Take 1 tablet (20 mEq total) by mouth daily. 08/02/22   Johnson, Clanford L, MD  spironolactone  (ALDACTONE ) 25 MG tablet Take 25 mg by mouth daily. 07/09/23   [provider]  torsemide  (DEMADEX ) 20 MG tablet Take 2 tablets (40 mg total) by mouth daily. 06/02/23   Mallipeddi, Vishnu P, MD      Allergies    Cefazolin , Ceftriaxone , and Ertapenem     Review of Systems   Review of Systems  Skin:  Positive for rash.  All other systems reviewed and are  negative.   Physical Exam Updated Vital Signs BP 109/72 (BP Location: Right Arm)   Pulse 81   Temp 97.8 F (36.6 C) (Oral)   Resp 18   Ht 6' 3 (1.905 m)   Wt 122.5 kg   SpO2 100%   BMI 33.76 kg/m  Physical Exam Vitals and nursing note reviewed.  Constitutional:      Appearance: Normal appearance.  HENT:     Head: Normocephalic and atraumatic.     Nose: Nose normal.     Mouth/Throat:     Mouth: Mucous membranes are moist.  Eyes:     Extraocular Movements: Extraocular movements intact.     Conjunctiva/sclera: Conjunctivae normal.     Pupils: Pupils are equal, round, and reactive to light.  Cardiovascular:     Rate and Rhythm: Normal rate and regular rhythm.     Pulses: Normal pulses.     Heart sounds: Normal heart sounds. No murmur heard.    No gallop.  Pulmonary:     Effort: Pulmonary effort is normal. No respiratory distress.     Breath sounds: Normal breath sounds. No stridor. No wheezing, rhonchi or rales.  Musculoskeletal:        General: Normal range of motion.     Cervical back: Normal range of motion and neck supple.  Skin:  General: Skin is warm and dry.     Comments: Hives noted diffusely over bilateral upper and lower extremities, no petechia, purpura, bullae, no lesions to palms, soles, oral mucosa  Neurological:     General: No focal deficit present.     Mental Status: He is alert and oriented to person, place, and time. Mental status is at baseline.  Psychiatric:        Mood and Affect: Mood normal.        Behavior: Behavior normal.        Thought Content: Thought content normal.        Judgment: Judgment normal.     ED Results / Procedures / Treatments   Labs (all labs ordered are listed, but only abnormal results are displayed) Labs Reviewed - No data to display  EKG None  Radiology No results found.  Procedures Procedures    Medications Ordered in ED Medications  hydrOXYzine  (ATARAX ) tablet 50 mg (has no administration in time  range)  predniSONE (DELTASONE) tablet 60 mg (has no administration in time range)    ED Course/ Medical Decision Making/ A&P                                 Medical Decision Making Patient is doing well at this time and is stable for discharge home.  Discussed with patient that we will cover her for an acute allergic process at this point.  He has no indication for Stevens-Johnson syndrome, toxic epidermal necrolysis, tickborne illness, syphilis, meningitis.  Patient has no lesions to palms, soles, oral mucosa.  He has no petechiae, purpura, bullae.  He has had no recent antibiotic use.  Will continue treatment on outpatient basis.  Close follow-up with PCP was discussed as well as strict turn precautions for any new or worsening symptoms.  Patient voiced understanding and had no additional questions.  Risk Prescription drug management.           Final Clinical Impression(s) / ED Diagnoses Final diagnoses:  None    Rx / DC Orders ED Discharge Orders     None         Emmalene Hare 07/22/23 1115    Arvilla Birmingham, MD 07/22/23 1458

## 2023-08-31 DIAGNOSIS — R404 Transient alteration of awareness: Secondary | ICD-10-CM | POA: Diagnosis not present

## 2023-09-11 DEATH — deceased
